# Patient Record
Sex: Female | Born: 1950 | Race: Black or African American | Hispanic: No | Smoking: Never smoker
Health system: Southern US, Community
[De-identification: ages and names within clinical notes are randomized; demographics above are authoritative.]

## PROBLEM LIST (undated history)

## (undated) DIAGNOSIS — Z8489 Family history of other specified conditions: Secondary | ICD-10-CM

## (undated) DIAGNOSIS — I10 Essential (primary) hypertension: Secondary | ICD-10-CM

## (undated) DIAGNOSIS — I82409 Acute embolism and thrombosis of unspecified deep veins of unspecified lower extremity: Secondary | ICD-10-CM

## (undated) DIAGNOSIS — R0600 Dyspnea, unspecified: Secondary | ICD-10-CM

## (undated) DIAGNOSIS — I739 Peripheral vascular disease, unspecified: Secondary | ICD-10-CM

## (undated) DIAGNOSIS — Z9289 Personal history of other medical treatment: Secondary | ICD-10-CM

## (undated) DIAGNOSIS — M1990 Unspecified osteoarthritis, unspecified site: Secondary | ICD-10-CM

## (undated) DIAGNOSIS — T8859XA Other complications of anesthesia, initial encounter: Secondary | ICD-10-CM

## (undated) DIAGNOSIS — Z8679 Personal history of other diseases of the circulatory system: Secondary | ICD-10-CM

## (undated) DIAGNOSIS — I2699 Other pulmonary embolism without acute cor pulmonale: Secondary | ICD-10-CM

## (undated) DIAGNOSIS — R7303 Prediabetes: Secondary | ICD-10-CM

## (undated) DIAGNOSIS — K219 Gastro-esophageal reflux disease without esophagitis: Secondary | ICD-10-CM

## (undated) HISTORY — PX: TONSILLECTOMY AND ADENOIDECTOMY: SHX28

## (undated) HISTORY — DX: Other pulmonary embolism without acute cor pulmonale: I26.99

## (undated) HISTORY — PX: APPENDECTOMY: SHX54

## (undated) HISTORY — DX: Personal history of other diseases of the circulatory system: Z86.79

---

## 2011-05-04 DIAGNOSIS — I2699 Other pulmonary embolism without acute cor pulmonale: Secondary | ICD-10-CM

## 2011-05-04 DIAGNOSIS — I82409 Acute embolism and thrombosis of unspecified deep veins of unspecified lower extremity: Secondary | ICD-10-CM

## 2011-05-04 DIAGNOSIS — Z86718 Personal history of other venous thrombosis and embolism: Secondary | ICD-10-CM | POA: Insufficient documentation

## 2011-05-04 HISTORY — DX: Other pulmonary embolism without acute cor pulmonale: I26.99

## 2011-05-04 HISTORY — DX: Acute embolism and thrombosis of unspecified deep veins of unspecified lower extremity: I82.409

## 2014-04-01 DIAGNOSIS — Z86718 Personal history of other venous thrombosis and embolism: Secondary | ICD-10-CM | POA: Insufficient documentation

## 2015-11-02 ENCOUNTER — Emergency Department: Payer: Self-pay

## 2015-11-02 ENCOUNTER — Emergency Department
Admission: EM | Admit: 2015-11-02 | Discharge: 2015-11-02 | Disposition: A | Discharge status: Home / Self Care | Payer: Self-pay | Attending: Emergency Medicine | Admitting: Emergency Medicine

## 2015-11-02 ENCOUNTER — Encounter: Payer: Self-pay | Admitting: Emergency Medicine

## 2015-11-02 DIAGNOSIS — M5442 Lumbago with sciatica, left side: Secondary | ICD-10-CM | POA: Insufficient documentation

## 2015-11-02 DIAGNOSIS — I10 Essential (primary) hypertension: Secondary | ICD-10-CM | POA: Insufficient documentation

## 2015-11-02 DIAGNOSIS — Z86718 Personal history of other venous thrombosis and embolism: Secondary | ICD-10-CM | POA: Insufficient documentation

## 2015-11-02 HISTORY — DX: Essential (primary) hypertension: I10

## 2015-11-02 HISTORY — DX: Acute embolism and thrombosis of unspecified deep veins of unspecified lower extremity: I82.409

## 2015-11-02 LAB — URINALYSIS COMPLETE WITH MICROSCOPIC (ARMC ONLY)
BILIRUBIN URINE: NEGATIVE
Glucose, UA: NEGATIVE mg/dL
Hgb urine dipstick: NEGATIVE
KETONES UR: NEGATIVE mg/dL
Nitrite: NEGATIVE
Protein, ur: NEGATIVE mg/dL
Specific Gravity, Urine: 1.018 (ref 1.005–1.030)
pH: 5 (ref 5.0–8.0)

## 2015-11-02 MED ORDER — OXYCODONE-ACETAMINOPHEN 5-325 MG PO TABS: ORAL_TABLET | ORAL | Status: DC

## 2015-11-02 MED ORDER — KETOROLAC TROMETHAMINE 30 MG/ML IJ SOLN: 30.0000 mg | Freq: Once | INTRAMUSCULAR | Status: DC

## 2015-11-02 MED ORDER — OXYCODONE-ACETAMINOPHEN 5-325 MG PO TABS
1.0000 | ORAL_TABLET | Freq: Once | ORAL | Status: AC
  Administered 2015-11-02: 1 via ORAL
  Filled 2015-11-02: qty 1

## 2015-11-02 NOTE — Discharge Instructions (Signed)
Discontinue taking tramadol for pain. Percocet 1 tablet every 46 hours as needed for pain. Be aware that you should not drive or operate machinery while taking this medication. This medication also can cause drowsiness which may increase your risk for falling. Make an appointment with Dr. Joice LoftsPoggi if any continued problems with your back or sciatica.

## 2015-11-02 NOTE — ED Notes (Signed)
C/O low back pain radiating down left leg.  Patient has been going [facility] for same complaint, but pain returns.

## 2015-11-02 NOTE — ED Notes (Signed)
Patient states she has had pain to left lower back extending down through the left buttocks down behind left knee.  Pt states she feels like it is rubbing against bone.  Pain is 6/10.

## 2015-11-02 NOTE — ED Provider Notes (Signed)
[facility] Emergency Department Provider Note  ____________________________________________  Time seen: Approximately 1:12 PM  I have reviewed the triage vital signs and the nursing notes.   HISTORY  Chief Complaint Back Pain   HPI Leah Roberts is a 65 y.o. female is here with complaint of left low back pain radiating through her left buttocks just down past her left knee. She denies any numbness in her foot or paresthesias of her toes. Patient states that she has been seen [facility] walk-in twice for the same complaint. Patient currently is taking tramadol for pain however she states that this is not alleviating her pain and she is also completed a course of prednisone. Patient denies any injury. She states she has not had any x-rays done of her back.Patient denies any incontinence of bowel or bladder and does not have a history of injury to her back or hip area. Patient is unable to get comfortable in any position regardless of her pain medication today. She rates her pain as an 8 out of 10.   Past Medical History  Diagnosis Date  . Hypertension   . DVT (deep venous thrombosis) (HCC)     There are no active problems to display for this patient.   History reviewed. No pertinent past surgical history.  Current Outpatient Rx  Name  Route  Sig  Dispense  Refill  . oxyCODONE-acetaminophen (PERCOCET) 5-325 MG tablet      Take 1 tablet every 4-6 hours as needed for pain.   20 tablet   0     Allergies Sulfa antibiotics  No family history on file.  Social History Social History  Substance Use Topics  . Smoking status: Never Smoker   . Smokeless tobacco: None  . Alcohol Use: None    Review of Systems Constitutional: No fever/chills Cardiovascular: Denies chest pain. Respiratory: Denies shortness of breath. Gastrointestinal: No abdominal pain.  No nausea, no vomiting.  Genitourinary: Negative for  dysuria. Musculoskeletal:Positive for low back pain and left hip pain. Skin: Negative for rash. Neurological: Negative for headaches, focal weakness or numbness.Positive for paresthesias left lower extremity.  10-point ROS otherwise negative.  ____________________________________________   PHYSICAL EXAM:  VITAL SIGNS: ED Triage Vitals  Enc Vitals Group     BP 11/02/15 1155 134/101 mmHg     Pulse Rate 11/02/15 1155 83     Resp 11/02/15 1155 18     Temp 11/02/15 1155 98.2 F (36.8 C)     Temp Source 11/02/15 1155 Oral     SpO2 11/02/15 1155 98 %     Weight 11/02/15 1155 462 lb 15.5 oz (210 kg)     Height 11/02/15 1155 5\' 7"  (1.702 m)     Head Cir --      Peak Flow --      Pain Score 11/02/15 1157 8     Pain Loc --      Pain Edu? --      Excl. in GC? --     Constitutional: Alert and oriented. Well appearing and in no acute distress. Eyes: Conjunctivae are normal. PERRL. EOMI. Head: Atraumatic. Nose: No congestion/rhinnorhea. Neck: No stridor.   Cardiovascular: Normal rate, regular rhythm. Grossly normal heart sounds.  Good peripheral circulation. Respiratory: Normal respiratory effort.  No retractions. Lungs CTAB. Musculoskeletal: On examination of the back there is no gross deformity however there is moderate tenderness on palpation of the lumbar spine. There is no tenderness on palpation of the left hip.  Range of motion is without restriction. Patient is ambulatory with a slight limp. Neurologic:  Normal speech and language. No gross focal neurologic deficits are appreciated. No gait instability. There is no deformity or effusion noted of the left knee. Skin:  Skin is warm, dry and intact. No rash noted. Psychiatric: Mood and affect are normal. Speech and behavior are normal.  ____________________________________________   LABS (all labs ordered are listed, but only abnormal results are displayed)  Labs Reviewed  URINALYSIS COMPLETEWITH MICROSCOPIC ([facility] ONLY) -  Abnormal; Notable for the following:    Color, Urine YELLOW (*)    APPearance HAZY (*)    Leukocytes, UA 1+ (*)    Bacteria, UA RARE (*)    Squamous Epithelial / LPF 0-5 (*)    All other components within normal limits     RADIOLOGY  Lumbar spine per radiologist shows loss of disc height with an plate degeneration seen at T 11 T12 and T12-L1. I, Tommi Rumpshonda L Summers, personally viewed and evaluated these images (plain radiographs) as part of my medical decision making, as well as reviewing the written report by the radiologist. ____________________________________________   PROCEDURES  Procedure(s) performed: None  Critical Care performed: No  ____________________________________________   INITIAL IMPRESSION / ASSESSMENT AND PLAN / ED COURSE  Pertinent labs & imaging results that were available during my care of the patient were reviewed by me and considered in my medical decision making (see chart for details  ----------------------------------------- 2:55 PM on 11/02/2015 ----------------------------------------- Checking on patient patient was asleep on the stretcher and was awakened easily. Patient states that her pain has decreased considerably. Patient is having sister pick her up as the patient drove herself to the emergency room. She is aware that after taking the narcotic that she cannot drive herself. Sr. lives close by and is aware that she will need to pick up the patient.  ____________________________________________   FINAL CLINICAL IMPRESSION(S) / ED DIAGNOSES  Final diagnoses:  Left-sided low back pain with left-sided sciatica      NEW MEDICATIONS STARTED DURING THIS VISIT:  Discharge Medication List as of 11/02/2015  3:07 PM    START taking these medications   Details  oxyCODONE-acetaminophen (PERCOCET) 5-325 MG tablet Take 1 tablet every 4-6 hours as needed for pain., Print         Note:  This document was prepared using Dragon voice recognition  software and may include unintentional dictation errors.    Tommi RumpsRhonda L Summers, PA-C 11/02/15 1527  Sharman CheekPhillip Stafford, MD 11/03/15 [phone]

## 2016-06-18 ENCOUNTER — Encounter: Payer: Self-pay | Admitting: Family Medicine

## 2016-06-18 ENCOUNTER — Ambulatory Visit (INDEPENDENT_AMBULATORY_CARE_PROVIDER_SITE_OTHER): Payer: Self-pay | Admitting: Family Medicine

## 2016-06-18 VITALS — BP 117/89 | HR 92 | Temp 97.9°F | Ht 65.75 in | Wt 218.0 lb

## 2016-06-18 DIAGNOSIS — Z7901 Long term (current) use of anticoagulants: Secondary | ICD-10-CM

## 2016-06-18 DIAGNOSIS — M25561 Pain in right knee: Secondary | ICD-10-CM | POA: Insufficient documentation

## 2016-06-18 DIAGNOSIS — M25562 Pain in left knee: Secondary | ICD-10-CM

## 2016-06-18 DIAGNOSIS — Z13220 Encounter for screening for lipoid disorders: Secondary | ICD-10-CM

## 2016-06-18 DIAGNOSIS — Z8679 Personal history of other diseases of the circulatory system: Secondary | ICD-10-CM

## 2016-06-18 DIAGNOSIS — G8929 Other chronic pain: Secondary | ICD-10-CM

## 2016-06-18 DIAGNOSIS — I2699 Other pulmonary embolism without acute cor pulmonale: Secondary | ICD-10-CM

## 2016-06-18 LAB — LIPID PANEL
CHOL/HDL RATIO: 5
Cholesterol: 232 mg/dL — ABNORMAL HIGH (ref 0–200)
HDL: 47 mg/dL (ref >39.00)
LDL CALC: 156 mg/dL — AB (ref 0–99)
NONHDL: 184.69
Triglycerides: 143 mg/dL (ref 0.0–149.0)
VLDL: 28.6 mg/dL (ref 0.0–40.0)

## 2016-06-18 LAB — COMPREHENSIVE METABOLIC PANEL
ALT: 13 U/L (ref 0–35)
AST: 19 U/L (ref 0–37)
Albumin: 3.9 g/dL (ref 3.5–5.2)
Alkaline Phosphatase: 51 U/L (ref 39–117)
BILIRUBIN TOTAL: 0.4 mg/dL (ref 0.2–1.2)
BUN: 14 mg/dL (ref 6–23)
CALCIUM: 9.5 mg/dL (ref 8.4–10.5)
CHLORIDE: 108 meq/L (ref 96–112)
CO2: 25 meq/L (ref 19–32)
CREATININE: 0.98 mg/dL (ref 0.40–1.20)
GFR: 60.81 mL/min (ref >60.00)
GLUCOSE: 89 mg/dL (ref 70–99)
Potassium: 3.9 mEq/L (ref 3.5–5.1)
SODIUM: 140 meq/L (ref 135–145)
Total Protein: 7.1 g/dL (ref 6.0–8.3)

## 2016-06-18 LAB — PROTIME-INR
INR: 1.5 ratio — ABNORMAL HIGH (ref 0.8–1.0)
Prothrombin Time: 15.7 s — ABNORMAL HIGH (ref 9.6–13.1)

## 2016-06-18 LAB — CBC
HCT: 37.4 % (ref 36.0–46.0)
Hemoglobin: 12.1 g/dL (ref 12.0–15.0)
MCHC: 32.3 g/dL (ref 30.0–36.0)
MCV: 83.8 fl (ref 78.0–100.0)
PLATELETS: 213 10*3/uL (ref 150.0–400.0)
RBC: 4.46 Mil/uL (ref 3.87–5.11)
RDW: 14.2 % (ref 11.5–15.5)
WBC: 6.5 10*3/uL (ref 4.0–10.5)

## 2016-06-18 LAB — HEMOGLOBIN A1C: HEMOGLOBIN A1C: 6.3 % (ref 4.6–6.5)

## 2016-06-18 MED ORDER — TRAMADOL HCL 50 MG PO TABS: 50.0000 mg | ORAL_TABLET | Freq: Three times a day (TID) | ORAL | 1 refills | Status: DC | PRN

## 2016-06-18 NOTE — Progress Notes (Signed)
Pre visit review using our clinic review tool, if applicable. No additional management support is needed unless otherwise documented below in the visit note. 

## 2016-06-18 NOTE — Assessment & Plan Note (Signed)
New problem. Likely has underlying OA. Treating with Tramadol.

## 2016-06-18 NOTE — Assessment & Plan Note (Signed)
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 Labs including INR today.  Patient wants to switch to Eliquis. Will switch pending INR. Per guideline/recommendation INR needs to be less than 2 and then she can stop warfarin and start Eliquis.

## 2016-06-18 NOTE — Patient Instructions (Signed)
We will call with your lab results and on when to start the eliquis.  Continue your current medication.  You need a colonoscopy, pap smear, mammogram. You should consider hepatitis C testing and HIV testing as well.  You are a candidate for a shingles vaccine as well.  Take care  Dr. Lacinda Axon   Health Maintenance, Female Introduction Adopting a healthy lifestyle and getting preventive care can go a long way to promote health and wellness. Talk with your health care provider about what schedule of regular examinations is right for you. This is a good chance for you to check in with your provider about disease prevention and staying healthy. In between checkups, there are plenty of things you can do on your own. Experts have done a lot of research about which lifestyle changes and preventive measures are most likely to keep you healthy. Ask your health care provider for more information. Weight and diet Eat a healthy diet  Be sure to include plenty of vegetables, fruits, low-fat dairy products, and lean protein.  Do not eat a lot of foods high in solid fats, added sugars, or salt.  Get regular exercise. This is one of the most important things you can do for your health.  Most adults should exercise for at least 150 minutes each week. The exercise should increase your heart rate and make you sweat (moderate-intensity exercise).  Most adults should also do strengthening exercises at least twice a week. This is in addition to the moderate-intensity exercise. Maintain a healthy weight  Body mass index (BMI) is a measurement that can be used to identify possible weight problems. It estimates body fat based on height and weight. Your health care provider can help determine your BMI and help you achieve or maintain a healthy weight.  For females 39 years of age and older:  A BMI below 18.5 is considered underweight.  A BMI of 18.5 to 24.9 is normal.  A BMI of 25 to 29.9 is considered  overweight.  A BMI of 30 and above is considered obese. Watch levels of cholesterol and blood lipids  You should start having your blood tested for lipids and cholesterol at 64 years of age, then have this test every 5 years.  You may need to have your cholesterol levels checked more often if:  Your lipid or cholesterol levels are high.  You are older than 64 years of age.  You are at high risk for heart disease. Cancer screening Lung Cancer  Lung cancer screening is recommended for adults 38-69 years old who are at high risk for lung cancer because of a history of smoking.  A yearly low-dose CT scan of the lungs is recommended for people who:  Currently smoke.  Have quit within the past 15 years.  Have at least a 30-pack-year history of smoking. A pack year is smoking an average of one pack of cigarettes a day for 1 year.  Yearly screening should continue until it has been 15 years since you quit.  Yearly screening should stop if you develop a health problem that would prevent you from having lung cancer treatment. Breast Cancer  Practice breast self-awareness. This means understanding how your breasts normally appear and feel.  It also means doing regular breast self-exams. Let your health care provider know about any changes, no matter how small.  If you are in your 20s or 30s, you should have a clinical breast exam (CBE) by a health care provider every 1-3 years  as part of a regular health exam.  If you are 69 or older, have a CBE every year. Also consider having a breast X-ray (mammogram) every year.  If you have a family history of breast cancer, talk to your health care provider about genetic screening.  If you are at high risk for breast cancer, talk to your health care provider about having an MRI and a mammogram every year.  Breast cancer gene (BRCA) assessment is recommended for women who have family members with BRCA-related cancers. BRCA-related cancers  include:  Breast.  Ovarian.  Tubal.  Peritoneal cancers.  Results of the assessment will determine the need for genetic counseling and BRCA1 and BRCA2 testing. Cervical Cancer  Your health care provider may recommend that you be screened regularly for cancer of the pelvic organs (ovaries, uterus, and vagina). This screening involves a pelvic examination, including checking for microscopic changes to the surface of your cervix (Pap test). You may be encouraged to have this screening done every 3 years, beginning at age 45.  For women ages 44-65, health care providers may recommend pelvic exams and Pap testing every 3 years, or they may recommend the Pap and pelvic exam, combined with testing for human papilloma virus (HPV), every 5 years. Some types of HPV increase your risk of cervical cancer. Testing for HPV may also be done on women of any age with unclear Pap test results.  Other health care providers may not recommend any screening for nonpregnant women who are considered low risk for pelvic cancer and who do not have symptoms. Ask your health care provider if a screening pelvic exam is right for you.  If you have had past treatment for cervical cancer or a condition that could lead to cancer, you need Pap tests and screening for cancer for at least 20 years after your treatment. If Pap tests have been discontinued, your risk factors (such as having a new sexual partner) need to be reassessed to determine if screening should resume. Some women have medical problems that increase the chance of getting cervical cancer. In these cases, your health care provider may recommend more frequent screening and Pap tests. Colorectal Cancer  This type of cancer can be detected and often prevented.  Routine colorectal cancer screening usually begins at 64 years of age and continues through 64 years of age.  Your health care provider may recommend screening at an earlier age if you have risk factors  for colon cancer.  Your health care provider may also recommend using home test kits to check for hidden blood in the stool.  A small camera at the end of a tube can be used to examine your colon directly (sigmoidoscopy or colonoscopy). This is done to check for the earliest forms of colorectal cancer.  Routine screening usually begins at age 24.  Direct examination of the colon should be repeated every 5-10 years through 64 years of age. However, you may need to be screened more often if early forms of precancerous polyps or small growths are found. Skin Cancer  Check your skin from head to toe regularly.  Tell your health care provider about any new moles or changes in moles, especially if there is a change in a mole's shape or color.  Also tell your health care provider if you have a mole that is larger than the size of a pencil eraser.  Always use sunscreen. Apply sunscreen liberally and repeatedly throughout the day.  Protect yourself by wearing long  sleeves, pants, a wide-brimmed hat, and sunglasses whenever you are outside. Heart disease, diabetes, and high blood pressure  High blood pressure causes heart disease and increases the risk of stroke. High blood pressure is more likely to develop in:  People who have blood pressure in the high end of the normal range (130-139/85-89 mm Hg).  People who are overweight or obese.  People who are African American.  If you are 63-58 years of age, have your blood pressure checked every 3-5 years. If you are 42 years of age or older, have your blood pressure checked every year. You should have your blood pressure measured twice-once when you are at a hospital or clinic, and once when you are not at a hospital or clinic. Record the average of the two measurements. To check your blood pressure when you are not at a hospital or clinic, you can use:  An automated blood pressure machine at a pharmacy.  A home blood pressure monitor.  If you  are between 33 years and 35 years old, ask your health care provider if you should take aspirin to prevent strokes.  Have regular diabetes screenings. This involves taking a blood sample to check your fasting blood sugar level.  If you are at a normal weight and have a low risk for diabetes, have this test once every three years after 64 years of age.  If you are overweight and have a high risk for diabetes, consider being tested at a younger age or more often. Preventing infection Hepatitis B  If you have a higher risk for hepatitis B, you should be screened for this virus. You are considered at high risk for hepatitis B if:  You were born in a country where hepatitis B is common. Ask your health care provider which countries are considered high risk.  Your parents were born in a high-risk country, and you have not been immunized against hepatitis B (hepatitis B vaccine).  You have HIV or AIDS.  You use needles to inject street drugs.  You live with someone who has hepatitis B.  You have had sex with someone who has hepatitis B.  You get hemodialysis treatment.  You take certain medicines for conditions, including cancer, organ transplantation, and autoimmune conditions. Hepatitis C  Blood testing is recommended for:  Everyone born from [birth year] through 1963.  Anyone with known risk factors for hepatitis C. Sexually transmitted infections (STIs)  You should be screened for sexually transmitted infections (STIs) including gonorrhea and chlamydia if:  You are sexually active and are younger than 64 years of age.  You are older than 64 years of age and your health care provider tells you that you are at risk for this type of infection.  Your sexual activity has changed since you were last screened and you are at an increased risk for chlamydia or gonorrhea. Ask your health care provider if you are at risk.  If you do not have HIV, but are at risk, it may be recommended that you  take a prescription medicine daily to prevent HIV infection. This is called pre-exposure prophylaxis (PrEP). You are considered at risk if:  You are sexually active and do not regularly use condoms or know the HIV status of your partner(s).  You take drugs by injection.  You are sexually active with a partner who has HIV. Talk with your health care provider about whether you are at high risk of being infected with HIV. If you choose to begin  PrEP, you should first be tested for HIV. You should then be tested every 3 months for as long as you are taking PrEP. Pregnancy  If you are premenopausal and you may become pregnant, ask your health care provider about preconception counseling.  If you may become pregnant, take 400 to 800 micrograms (mcg) of folic acid every day.  If you want to prevent pregnancy, talk to your health care provider about birth control (contraception). Osteoporosis and menopause  Osteoporosis is a disease in which the bones lose minerals and strength with aging. This can result in serious bone fractures. Your risk for osteoporosis can be identified using a bone density scan.  If you are 41 years of age or older, or if you are at risk for osteoporosis and fractures, ask your health care provider if you should be screened.  Ask your health care provider whether you should take a calcium or vitamin D supplement to lower your risk for osteoporosis.  Menopause may have certain physical symptoms and risks.  Hormone replacement therapy may reduce some of these symptoms and risks. Talk to your health care provider about whether hormone replacement therapy is right for you. Follow these instructions at home:  Schedule regular health, dental, and eye exams.  Stay current with your immunizations.  Do not use any tobacco products including cigarettes, chewing tobacco, or electronic cigarettes.  If you are pregnant, do not drink alcohol.  If you are breastfeeding, limit  how much and how often you drink alcohol.  Limit alcohol intake to no more than 1 drink per day for nonpregnant women. One drink equals 12 ounces of beer, 5 ounces of wine, or 1 ounces of hard liquor.  Do not use street drugs.  Do not share needles.  Ask your health care provider for help if you need support or information about quitting drugs.  Tell your health care provider if you often feel depressed.  Tell your health care provider if you have ever been abused or do not feel safe at home. This information is not intended to replace advice given to you by your health care provider. Make sure you discuss any questions you have with your health care provider. Document Released: 11/02/2010 Document Revised: 09/25/2015 Document Reviewed: 01/21/2015  2017 [employer]

## 2016-06-18 NOTE — Progress Notes (Signed)
Subjective:  Patient ID: Leah Roberts, female    DOB: Nov 24, 1950  Age: 66 y.o. MRN: [id number]  CC: Establish care - Warfarin management, Leg/knee pain  HPI Leah Roberts is a 66 y.o. female presents to the clinic today to establish care.  Issues are below.  Hx of DVT/PE  Patient reports a history of PE and DVT. She's had 2 separate blood clots.  She is from [place]. She has been seeing a specialist in [place]. She is currently on warfarin.  Patient has not had her INR checked in over a year.  She endorses compliance with warfarin 5 mg daily.  She is interested in switching to Eliquis and would like to discuss this today.  Leg pain, knee pain  Patient reports a long-standing history of left leg pain and knee pain.  She states that this is starting to bother her significantly and interfering with activity.  She states that she exercises regularly.  She reports left knee pain and periods of time where she experiences weakness.  She also reports diffuse left lower leg pain. She wears compression stockings regular.  Additionally, she's had some associated low back pain.  Knee pain seems to be worse with activity.  Compression stockings do help her pain.  No other associated symptoms. No other complaint at this time.  PMH, Surgical Hx, Family Hx, Social History reviewed and updated as below.  Past Medical History:  Diagnosis Date  . DVT (deep venous thrombosis) ([place]) 2013  . History of hypertension   . Pulmonary embolism ([place]) 2013   Past Surgical History:  Procedure Laterality Date  . APPENDECTOMY    . TONSILLECTOMY AND ADENOIDECTOMY     Family History  Problem Relation Age of Onset  . Hypertension Father   . Heart disease Father   . Thyroid disease Sister   . Allergies Sister    Social History  Substance Use Topics  . Smoking status: Never Smoker  . Smokeless tobacco: Never Used  . Alcohol use No   Review of Systems  Cardiovascular:  Positive for leg swelling.  Musculoskeletal: Positive for back pain.       L leg/knee pain.  Neurological: Positive for weakness, numbness and headaches.  All other systems reviewed and are negative.  Objective:   Today's Vitals: BP 117/89 (BP Location: Left Arm, Patient Position: Sitting, Cuff Size: Large)   Pulse 92   Temp 97.9 F (36.6 C) (Oral)   Ht 5' 5.75" (1.67 m)   Wt 218 lb (98.9 kg)   SpO2 97%   BMI 35.45 kg/m   Physical Exam  Constitutional: She is oriented to person, place, and time. She appears well-developed. No distress.  HENT:  Head: Normocephalic and atraumatic.  Mouth/Throat: Oropharynx is clear and moist.  Eyes: Conjunctivae are normal. No scleral icterus.  Neck: Neck supple. No thyromegaly present.  Cardiovascular: Normal rate and regular rhythm.   Pulmonary/Chest: Effort normal and breath sounds normal. She has no wheezes.  Abdominal: Soft. She exhibits no distension. There is no tenderness. There is no rebound and no guarding.  Musculoskeletal:  Left leg/knee: Crepitus noted with extension/flexion of knee. Varicose veins noted.  Lymphadenopathy:    She has no cervical adenopathy.  Neurological: She is alert and oriented to person, place, and time.  Skin: Skin is warm. No rash noted.  Psychiatric: She has a normal mood and affect.  Vitals reviewed.  Assessment & Plan:   Problem List Items Addressed This Visit    Pulmonary embolism ([place]) -  Primary    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 Labs including INR today.  Patient wants to switch to Eliquis. Will switch pending INR. Per guideline/recommendation INR needs to be less than 2 and then she can stop warfarin and start Eliquis.      Relevant Medications   warfarin (COUMADIN) 5 MG tablet   Left knee pain    New problem. Likely has underlying OA. Treating with Tramadol.       Other Visit Diagnoses    Long-term (current) use of anticoagulants       Relevant Orders   CBC   INR/PT    HgB A1c   History of hypertension       Relevant Orders   Comp Met (CMET)   Screening, lipid       Relevant Orders   Lipid panel      Outpatient Encounter Prescriptions as of 06/18/2016  Medication Sig  . chlorpheniramine (CHLOR-TRIMETON) 4 MG tablet Take 4 mg by mouth daily.  Marland Kitchen warfarin (COUMADIN) 5 MG tablet Take 5 mg by mouth daily.  . traMADol (ULTRAM) 50 MG tablet Take 1 tablet (50 mg total) by mouth every 8 (eight) hours as needed.   No facility-administered encounter medications on file as of 06/18/2016.     Follow-up: Pending lab work up.  Imogene

## 2016-06-21 ENCOUNTER — Other Ambulatory Visit: Payer: Self-pay | Admitting: Family Medicine

## 2016-06-21 MED ORDER — ROSUVASTATIN CALCIUM 20 MG PO TABS: 20.0000 mg | ORAL_TABLET | Freq: Every day | ORAL | 3 refills | Status: DC

## 2016-06-21 MED ORDER — APIXABAN 2.5 MG PO TABS: 2.5000 mg | ORAL_TABLET | Freq: Two times a day (BID) | ORAL | 3 refills | Status: DC

## 2016-06-24 ENCOUNTER — Telehealth: Payer: Self-pay | Admitting: Family Medicine

## 2016-06-24 NOTE — Telephone Encounter (Signed)
Pt alled and stated that the pharmacy did not receive her rx for apixaban (ELIQUIS) 2.5 MG TABS tablet. Can we please resend? She would also like a copy of her lab work mailed to her. Please advise, thank you!  Pharmacy -  [facility] [address]- [place], [place] - [id number]  Pt's Address - [address]  [place] [address]  Call pt @ [phone]

## 2016-06-24 NOTE — Telephone Encounter (Signed)
Called pharmacy they have had rx for four days and will call pt to let her know they've had it.  Letter was sent.

## 2016-08-04 ENCOUNTER — Ambulatory Visit (INDEPENDENT_AMBULATORY_CARE_PROVIDER_SITE_OTHER): Payer: Self-pay

## 2016-08-04 ENCOUNTER — Encounter: Payer: Self-pay | Admitting: Family Medicine

## 2016-08-04 ENCOUNTER — Encounter: Payer: Self-pay | Admitting: Emergency Medicine

## 2016-08-04 ENCOUNTER — Ambulatory Visit (INDEPENDENT_AMBULATORY_CARE_PROVIDER_SITE_OTHER): Payer: Self-pay | Admitting: Family Medicine

## 2016-08-04 VITALS — BP 130/80 | HR 83 | Temp 98.6°F | Wt 218.0 lb

## 2016-08-04 DIAGNOSIS — M549 Dorsalgia, unspecified: Secondary | ICD-10-CM | POA: Insufficient documentation

## 2016-08-04 DIAGNOSIS — M25562 Pain in left knee: Secondary | ICD-10-CM

## 2016-08-04 DIAGNOSIS — M545 Low back pain: Secondary | ICD-10-CM

## 2016-08-04 DIAGNOSIS — R7303 Prediabetes: Secondary | ICD-10-CM | POA: Insufficient documentation

## 2016-08-04 DIAGNOSIS — E785 Hyperlipidemia, unspecified: Secondary | ICD-10-CM

## 2016-08-04 LAB — LIPID PANEL
CHOL/HDL RATIO: 5
CHOLESTEROL: 208 mg/dL — AB (ref 0–200)
HDL: 40.9 mg/dL (ref >39.00)
LDL Cholesterol: 138 mg/dL — ABNORMAL HIGH (ref 0–99)
NonHDL: 167.48
Triglycerides: 148 mg/dL (ref 0.0–149.0)
VLDL: 29.6 mg/dL (ref 0.0–40.0)

## 2016-08-04 MED ORDER — APIXABAN 2.5 MG PO TABS: 2.5000 mg | ORAL_TABLET | Freq: Two times a day (BID) | ORAL | 3 refills | Status: DC

## 2016-08-04 MED ORDER — TRAMADOL HCL 50 MG PO TABS: 50.0000 mg | ORAL_TABLET | Freq: Three times a day (TID) | ORAL | 1 refills | Status: DC | PRN

## 2016-08-04 NOTE — Patient Instructions (Addendum)
Xray today. We will call with the results.  Exercises for the back are below.  I have refilled your medication.  Follow up in 6 months.  Take care  Dr. Adriana Simas    Back Exercises The following exercises strengthen the muscles that help to support the back. They also help to keep the lower back flexible. Doing these exercises can help to prevent back pain or lessen existing pain. If you have back pain or discomfort, try doing these exercises 2-3 times each day or as told by your health care provider. When the pain goes away, do them once each day, but increase the number of times that you repeat the steps for each exercise (do more repetitions). If you do not have back pain or discomfort, do these exercises once each day or as told by your health care provider. Exercises Single Knee to Chest   Repeat these steps 3-5 times for each leg: 1. Lie on your back on a firm bed or the floor with your legs extended. 2. Bring one knee to your chest. Your other leg should stay extended and in contact with the floor. 3. Hold your knee in place by grabbing your knee or thigh. 4. Pull on your knee until you feel a gentle stretch in your lower back. 5. Hold the stretch for 10-30 seconds. 6. Slowly release and straighten your leg. Pelvic Tilt   Repeat these steps 5-10 times: 1. Lie on your back on a firm bed or the floor with your legs extended. 2. Bend your knees so they are pointing toward the ceiling and your feet are flat on the floor. 3. Tighten your lower abdominal muscles to press your lower back against the floor. This motion will tilt your pelvis so your tailbone points up toward the ceiling instead of pointing to your feet or the floor. 4. With gentle tension and even breathing, hold this position for 5-10 seconds. Cat-Cow   Repeat these steps until your lower back becomes more flexible: 1. Get into a hands-and-knees position on a firm surface. Keep your hands under your shoulders, and keep  your knees under your hips. You may place padding under your knees for comfort. 2. Let your head hang down, and point your tailbone toward the floor so your lower back becomes rounded like the back of a cat. 3. Hold this position for 5 seconds. 4. Slowly lift your head and point your tailbone up toward the ceiling so your back forms a sagging arch like the back of a cow. 5. Hold this position for 5 seconds. Press-Ups   Repeat these steps 5-10 times: 1. Lie on your abdomen (face-down) on the floor. 2. Place your palms near your head, about shoulder-width apart. 3. While you keep your back as relaxed as possible and keep your hips on the floor, slowly straighten your arms to raise the top half of your body and lift your shoulders. Do not use your back muscles to raise your upper torso. You may adjust the placement of your hands to make yourself more comfortable. 4. Hold this position for 5 seconds while you keep your back relaxed. 5. Slowly return to lying flat on the floor. Bridges   Repeat these steps 10 times: 1. Lie on your back on a firm surface. 2. Bend your knees so they are pointing toward the ceiling and your feet are flat on the floor. 3. Tighten your buttocks muscles and lift your buttocks off of the floor until your waist is  at almost the same height as your knees. You should feel the muscles working in your buttocks and the back of your thighs. If you do not feel these muscles, slide your feet 1-2 inches farther away from your buttocks. 4. Hold this position for 3-5 seconds. 5. Slowly lower your hips to the starting position, and allow your buttocks muscles to relax completely. If this exercise is too easy, try doing it with your arms crossed over your chest. Abdominal Crunches   Repeat these steps 5-10 times: 1. Lie on your back on a firm bed or the floor with your legs extended. 2. Bend your knees so they are pointing toward the ceiling and your feet are flat on the  floor. 3. Cross your arms over your chest. 4. Tip your chin slightly toward your chest without bending your neck. 5. Tighten your abdominal muscles and slowly raise your trunk (torso) high enough to lift your shoulder blades a tiny bit off of the floor. Avoid raising your torso higher than that, because it can put too much stress on your low back and it does not help to strengthen your abdominal muscles. 6. Slowly return to your starting position. Back Lifts  Repeat these steps 5-10 times: 1. Lie on your abdomen (face-down) with your arms at your sides, and rest your forehead on the floor. 2. Tighten the muscles in your legs and your buttocks. 3. Slowly lift your chest off of the floor while you keep your hips pressed to the floor. Keep the back of your head in line with the curve in your back. Your eyes should be looking at the floor. 4. Hold this position for 3-5 seconds. 5. Slowly return to your starting position. Contact a health care provider if:  Your back pain or discomfort gets much worse when you do an exercise.  Your back pain or discomfort does not lessen within 2 hours after you exercise. If you have any of these problems, stop doing these exercises right away. Do not do them again unless your health care provider says that you can. Get help right away if:  You develop sudden, severe back pain. If this happens, stop doing the exercises right away. Do not do them again unless your health care provider says that you can. This information is not intended to replace advice given to you by your health care provider. Make sure you discuss any questions you have with your health care provider. Document Released: 05/27/2004 Document Revised: 08/27/2015 Document Reviewed: 06/13/2014 Elsevier Interactive Patient Education  2017 [employer].

## 2016-08-04 NOTE — Assessment & Plan Note (Signed)
New problem. Likely MSK in nature. Exercises given.  PRN Tramadol.

## 2016-08-04 NOTE — Progress Notes (Signed)
Subjective:  Patient ID: Leah Roberts, female    DOB: 12/03/1950  Age: 66 y.o. 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  Back pain  2 weeks.  No known fall, trauma, injury.  She has been more physically active recently.  She states like it is muscular in origin.  Worse with activity.  No known relieving factors.  She reports some associated numbness/tingling in her left leg. However, she states it is not as severe as her prior issues with sciatica.  No other associated symptoms.  Left knee pain  Improved since her last visit but still remains.  She is using tramadol as needed.  She is concerned and wants to know the etiology of her pain. As I told her at her last visit this is likely from osteoarthritis.  She wants an x-ray today.  Hyperlipidemia  She did not take the Crestor as prescribed.  She states that she elected for dietary changes instructed.  She would like her cholesterol rechecked today.  Social Hx   Social History   Social History  . Marital status: Widowed    Spouse name: N/A  . Number of children: N/A  . Years of education: N/A   Social History Main Topics  . Smoking status: Never Smoker  . Smokeless tobacco: Never Used  . Alcohol use No  . Drug use: No  . Sexual activity: Not Asked   Other Topics Concern  . None   Social History Narrative  . None    Review of Systems  Constitutional: Negative.   Musculoskeletal: Positive for back pain.       Knee pain.   Objective:  BP 130/80   Pulse 83   Temp 98.6 F (37 C) (Oral)   Wt 218 lb (98.9 kg)   SpO2 95%   BMI 35.45 kg/m   BP/Weight 08/04/2016 06/18/2016  Systolic BP 130 117  Diastolic BP 80 89  Wt. (Lbs) 218 218  BMI 35.45 35.45    Physical Exam  Constitutional: She is oriented to person, place, and time. She appears  well-developed. No distress.  Cardiovascular: Normal rate and regular rhythm.   Pulmonary/Chest: Effort normal and breath sounds normal.  Musculoskeletal:  Low back - no tenderness to palpation. Negative straight leg raise.  Left knee - ligament is intact. No swelling. Crepitus with range of motion.  Neurological: She is alert and oriented to person, place, and time.  Psychiatric: She has a normal mood and affect.  Vitals reviewed.   Lab Results  Component Value Date   WBC 6.5 06/18/2016   HGB 12.1 06/18/2016   HCT 37.4 06/18/2016   PLT 213.0 06/18/2016   GLUCOSE 89 06/18/2016   CHOL 232 (H) 06/18/2016   TRIG 143.0 06/18/2016   HDL 47.00 06/18/2016   LDLCALC 156 (H) 06/18/2016   ALT 13 06/18/2016   AST 19 06/18/2016   NA 140 06/18/2016   K 3.9 06/18/2016   CL 108 06/18/2016   CREATININE 0.98 06/18/2016   BUN 14 06/18/2016   CO2 25 06/18/2016   INR 1.5 (H) 06/18/2016   HGBA1C 6.3 06/18/2016    Assessment & Plan:   Problem List Items Addressed This Visit    Left knee pain    Improved but still persists. Xray today. Continue PRN Tramadol.       Relevant Orders   DG Knee Complete 4 Views  Left   Hyperlipidemia    Uncontrolled per last labs. Rechecking today.      Relevant Medications   apixaban (ELIQUIS) 2.5 MG TABS tablet   Other Relevant Orders   Lipid panel   Back pain - Primary    New problem. Likely MSK in nature. Exercises given.  PRN Tramadol.       Relevant Medications   traMADol (ULTRAM) 50 MG tablet      Meds ordered this encounter  Medications  . traMADol (ULTRAM) 50 MG tablet    Sig: Take 1 tablet (50 mg total) by mouth every 8 (eight) hours as needed.    Dispense:  90 tablet    Refill:  1  . apixaban (ELIQUIS) 2.5 MG TABS tablet    Sig: Take 1 tablet (2.5 mg total) by mouth 2 (two) times daily.    Dispense:  180 tablet    Refill:  3    Follow-up: Return in about 6 months (around 02/03/2017).  Everlene Other DO [facility]

## 2016-08-04 NOTE — Assessment & Plan Note (Signed)
Uncontrolled per last labs. Rechecking today.

## 2016-08-04 NOTE — Assessment & Plan Note (Signed)
Improved but still persists. Xray today. Continue PRN Tramadol.

## 2016-08-04 NOTE — Progress Notes (Signed)
Pre visit review using our clinic review tool, if applicable. No additional management support is needed unless otherwise documented below in the visit note. 

## 2017-01-25 ENCOUNTER — Telehealth: Payer: Self-pay | Admitting: *Deleted

## 2017-01-25 ENCOUNTER — Telehealth: Payer: Self-pay | Admitting: Family

## 2017-01-25 ENCOUNTER — Ambulatory Visit (INDEPENDENT_AMBULATORY_CARE_PROVIDER_SITE_OTHER): Payer: Self-pay | Admitting: Family

## 2017-01-25 ENCOUNTER — Ambulatory Visit
Admission: RE | Admit: 2017-01-25 | Discharge: 2017-01-25 | Disposition: A | Discharge status: Home / Self Care | Payer: Self-pay | Source: Ambulatory Visit | Attending: Family | Admitting: Family

## 2017-01-25 ENCOUNTER — Encounter: Payer: Self-pay | Admitting: Family

## 2017-01-25 VITALS — BP 138/90 | HR 70 | Temp 98.2°F | Ht 65.75 in | Wt 219.2 lb

## 2017-01-25 DIAGNOSIS — G8929 Other chronic pain: Secondary | ICD-10-CM | POA: Insufficient documentation

## 2017-01-25 DIAGNOSIS — M25562 Pain in left knee: Secondary | ICD-10-CM | POA: Insufficient documentation

## 2017-01-25 DIAGNOSIS — I2699 Other pulmonary embolism without acute cor pulmonale: Secondary | ICD-10-CM

## 2017-01-25 MED ORDER — TRAMADOL HCL 50 MG PO TABS: 50.0000 mg | ORAL_TABLET | Freq: Two times a day (BID) | ORAL | 1 refills | Status: DC

## 2017-01-25 MED ORDER — APIXABAN 2.5 MG PO TABS: 2.5000 mg | ORAL_TABLET | Freq: Two times a day (BID) | ORAL | 3 refills | Status: DC

## 2017-01-25 NOTE — Telephone Encounter (Signed)
Pt has requested to have a paper script for Eliquis  Pt contact [phone]

## 2017-01-25 NOTE — Telephone Encounter (Signed)
Marchelle Folks called from [facility] [place] and patient [place] was negative for DVT report in EPIC.

## 2017-01-25 NOTE — Patient Instructions (Signed)
At this point, I'll refer you to orthopedics since the pain is not responding to conservative therapy at home. May continue tramadol as needed.  Ice Ace wrap  Stat ultrasound of left left to ensure NO blood clot.   Follow-up in 3 months.

## 2017-01-25 NOTE — Assessment & Plan Note (Signed)
Chronic. Worsening. Failed conservative therapy at home. Refer to orthopedic. Refill tramadol to be used as needed. I looked up patient on [place] Controlled Substances Reporting System and saw no activity that raised concern of inappropriate use.

## 2017-01-25 NOTE — Progress Notes (Signed)
Subjective:    Patient ID: Leah Roberts, female    DOB: May 03, 1951, 66 y.o.   MRN: [id number]  CC: Verne Brines is a 66 y.o. female who presents today for an acute visit.    HPI: CC: left medial knee pain for several months, worsening.   Last week went to [facility] by car and thinks riding in car aggrevated left knee. Feels like knee pain is affecting left low back as hard to 'step without pain' and knee feels like it will give out h/o sciatica however not having numbness at this time. Notes swelling around knee. Wearing knee brace with some relief.  Tramadol with some relief.  No SOB, CP, fever, N, V.   No falls, injury.   H/o PE- Changed to elliquis from warfarin, several months ago. Doing well on eliquis. No bleeding.      Had been seen 5 months ago for back pain, left knee pain. She was using tramadol as needed at that time.   Left knee x-ray shows degenerative joint disease medially. No acute abnormality seen.2018 Lumbar spine XR 2017 shows loss of disc height T11-12 and T12-L1. No fracture, 2017.   No CKD.  HISTORY:  Past Medical History:  Diagnosis Date  . DVT (deep venous thrombosis) ([facility])   . DVT (deep venous thrombosis) ([facility]) 2013  . History of hypertension   . Hypertension   . Pulmonary embolism ([facility]) 2013   Past Surgical History:  Procedure Laterality Date  . APPENDECTOMY    . TONSILLECTOMY AND ADENOIDECTOMY     Family History  Problem Relation Age of Onset  . Hypertension Father   . Heart disease Father   . Thyroid disease Sister   . Allergies Sister     Allergies: Sulfa antibiotics and Sulfa antibiotics Current Outpatient Prescriptions on File Prior to Visit  Medication Sig Dispense Refill  . chlorpheniramine (CHLOR-TRIMETON) 4 MG tablet Take 4 mg by mouth daily.    . rosuvastatin (CRESTOR) 20 MG tablet Take 1 tablet (20 mg total) by mouth daily. 90 tablet 3   No current facility-administered medications on file prior to visit.      Social History  Substance Use Topics  . Smoking status: Never Smoker  . Smokeless tobacco: Never Used  . Alcohol use No    Review of Systems  Constitutional: Negative for chills and fever.  Respiratory: Negative for cough, shortness of breath and wheezing.   Cardiovascular: Positive for leg swelling. Negative for chest pain and palpitations.  Gastrointestinal: Negative for nausea and vomiting.  Musculoskeletal: Positive for back pain and joint swelling.  Neurological: Negative for numbness.      Objective:    BP 138/90   Pulse 70   Temp 98.2 F (36.8 C) (Oral)   Ht 5' 5.75" (1.67 m)   Wt 219 lb 3.2 oz (99.4 kg)   SpO2 97%   BMI 35.65 kg/m    Physical Exam  Constitutional: She appears well-developed and well-nourished.  Eyes: Conjunctivae are normal.  Cardiovascular: Normal rate, regular rhythm, normal heart sounds and normal pulses.   Trace LLE edema. No palpable cords or masses. No erythema or increased warmth. No asymmetry in calf size when compared bilaterally LE hair growth symmetric and present. No discoloration of varicosities noted. LE warm and palpable pedal pulses.   Pulmonary/Chest: Effort normal and breath sounds normal. She has no wheezes. She has no rhonchi. She has no rales.  Musculoskeletal:       Left knee: She exhibits normal  range of motion, no swelling and no effusion. Tenderness found. Medial joint line tenderness noted.  Bilateral knees are symmetric. No effusion appreciated. No increase in warmth or erythema. Crepitus felt with flexion of bilateral knees. Left knee:  Able to extend to -5 to 10 degrees and flex to 110 degrees. No catching with McMurray maneuver. No patellar apprehension. Negative anterior drawer and lachman's- no laxity appreciated.  No calf tenderness of lower leg edema bilaterally.    Neurological: She is alert.  Skin: Skin is warm and dry.  Psychiatric: She has a normal mood and affect. Her speech is normal and behavior  is normal. Thought content normal.  Vitals reviewed.      Assessment & Plan:   Problem List Items Addressed This Visit      Cardiovascular and Mediastinum   Pulmonary embolism ([facility])    Tolerating eliquis well. Had been on Coumadin the past. Refilled medication for patient today. No shortness of breath, tachycardia. Due to slight swelling in left lower extremity, pending stat ultrasound to ensure no DVT.      Relevant Medications   apixaban (ELIQUIS) 2.5 MG TABS tablet     Other   Left knee pain - Primary    Chronic. Worsening. Failed conservative therapy at home. Refer to orthopedic. Refill tramadol to be used as needed. I looked up patient on  Controlled Substances Reporting System and saw no activity that raised concern of inappropriate use.        Relevant Medications   traMADol (ULTRAM) 50 MG tablet   Other Relevant Orders   Ambulatory referral to Orthopedic Surgery   US Venous Img Lower Unilateral Left        I have discontinued Ms. Donado's oxyCODONE-acetaminophen. I have also changed her traMADol. Additionally, I am having her maintain her apixaban, chlorpheniramine, and rosuvastatin.   Meds ordered this encounter  Medications  . apixaban (ELIQUIS) 2.5 MG TABS tablet    Sig: Take 1 tablet (2.5 mg total) by mouth 2 (two) times daily.    Dispense:  180 tablet    Refill:  3    Order Specific Question:   Supervising Provider    Answer:   Duncan Dull L [[id number]]  . traMADol (ULTRAM) 50 MG tablet    Sig: Take 1 tablet (50 mg total) by mouth 2 (two) times daily.    Dispense:  60 tablet    Refill:  1    Order Specific Question:   Supervising Provider    Answer:   Sherlene Shams [[id number]]    Return precautions given.   Risks, benefits, and alternatives of the medications and treatment plan prescribed today were discussed, and patient expressed understanding.   Education regarding symptom management and diagnosis given to patient on AVS.  Continue to  follow with Allegra Grana, FNP for routine health maintenance.   Mineola Oralia Rud and I agreed with plan.   Rennie Plowman, FNP

## 2017-01-25 NOTE — Assessment & Plan Note (Signed)
Tolerating eliquis well. Had been on Coumadin the past. Refilled medication for patient today. No shortness of breath, tachycardia. Due to slight swelling in left lower extremity, pending stat ultrasound to ensure no DVT.

## 2017-01-25 NOTE — Telephone Encounter (Signed)
Ok to do

## 2017-01-25 NOTE — Progress Notes (Signed)
Pre visit review using our clinic review tool, if applicable. No additional management support is needed unless otherwise documented below in the visit note. 

## 2017-01-26 NOTE — Telephone Encounter (Signed)
Sure

## 2017-01-27 MED ORDER — APIXABAN 2.5 MG PO TABS: 2.5000 mg | ORAL_TABLET | Freq: Two times a day (BID) | ORAL | 0 refills | Status: DC

## 2017-01-27 NOTE — Telephone Encounter (Signed)
Script has been printed and ready for pick up by patient up front.

## 2017-01-27 NOTE — Telephone Encounter (Signed)
Patient has been notified

## 2017-03-18 ENCOUNTER — Telehealth: Payer: Self-pay | Admitting: Family

## 2017-03-18 DIAGNOSIS — I2699 Other pulmonary embolism without acute cor pulmonale: Secondary | ICD-10-CM

## 2017-03-18 NOTE — Telephone Encounter (Signed)
Patient says that she has enough pills to last her for 6 days. She says she doesn't want to take Warfarin. She took it for 4 years and did not like it so she would like something else. There is nothing else affordable for her to take without insurance.

## 2017-03-18 NOTE — Telephone Encounter (Signed)
Left very detailed message regarding last message.

## 2017-03-18 NOTE — Telephone Encounter (Signed)
Copied from CRM [phone]. Topic: Inquiry >> Mar 18, 2017 12:34 PM Alexander BergeronBarksdale, Harvey B wrote: Reason for CRM: PT [facility] and said [facility] well but since she doesn't have any insurance its real expensive ($400), so she wanted to know if she could get prescribed the 5mg  form of it so that she can break it in half and have it last two months or have something else prescribed that works the same and costs less, contact pt if needed

## 2017-03-18 NOTE — Telephone Encounter (Signed)
If she does not have insurance she will need to switch to coumadin. How much eliquis does she have left ?

## 2017-03-18 NOTE — Telephone Encounter (Signed)
Without insurance , all of the direct Thrombin inhibitors will be similarly expensive.  She should not have waited until the last minute to make this decision especially since her PCP was going out on maternity leave.  She can check with her pharmacy about Xarelto  20 mg daily

## 2017-03-18 NOTE — Telephone Encounter (Signed)
Patient taking Eliquis 2.5 mg BID she is wanting toget acript for 5 mg and cutin half to save money because she has no insurance but the 5 mg is not cored so , would she need another medication less expensive.

## 2017-03-18 NOTE — Telephone Encounter (Signed)
LMTCB

## 2017-03-22 MED ORDER — APIXABAN 2.5 MG PO TABS: 2.5000 mg | ORAL_TABLET | Freq: Two times a day (BID) | ORAL | 0 refills | Status: DC

## 2017-03-22 NOTE — Telephone Encounter (Signed)
Patient stated she will stay on Eliquis and pay the 400 a month hat she cannot take warfarin and xarelto  Cost the same as eliquis. I filled script FYI

## 2017-10-05 ENCOUNTER — Ambulatory Visit (INDEPENDENT_AMBULATORY_CARE_PROVIDER_SITE_OTHER): Payer: Self-pay | Admitting: Family

## 2017-10-05 ENCOUNTER — Encounter: Payer: Self-pay | Admitting: Family

## 2017-10-05 VITALS — BP 124/74 | HR 73 | Temp 98.1°F | Resp 16 | Wt 219.0 lb

## 2017-10-05 DIAGNOSIS — Z1159 Encounter for screening for other viral diseases: Secondary | ICD-10-CM

## 2017-10-05 DIAGNOSIS — Z1239 Encounter for other screening for malignant neoplasm of breast: Secondary | ICD-10-CM

## 2017-10-05 DIAGNOSIS — G8929 Other chronic pain: Secondary | ICD-10-CM

## 2017-10-05 DIAGNOSIS — M549 Dorsalgia, unspecified: Secondary | ICD-10-CM

## 2017-10-05 DIAGNOSIS — Z114 Encounter for screening for human immunodeficiency virus [HIV]: Secondary | ICD-10-CM | POA: Insufficient documentation

## 2017-10-05 DIAGNOSIS — E782 Mixed hyperlipidemia: Secondary | ICD-10-CM

## 2017-10-05 DIAGNOSIS — M25562 Pain in left knee: Secondary | ICD-10-CM

## 2017-10-05 DIAGNOSIS — Z1231 Encounter for screening mammogram for malignant neoplasm of breast: Secondary | ICD-10-CM

## 2017-10-05 DIAGNOSIS — Z1211 Encounter for screening for malignant neoplasm of colon: Secondary | ICD-10-CM | POA: Insufficient documentation

## 2017-10-05 DIAGNOSIS — I2699 Other pulmonary embolism without acute cor pulmonale: Secondary | ICD-10-CM

## 2017-10-05 DIAGNOSIS — R7303 Prediabetes: Secondary | ICD-10-CM

## 2017-10-05 DIAGNOSIS — G629 Polyneuropathy, unspecified: Secondary | ICD-10-CM | POA: Insufficient documentation

## 2017-10-05 LAB — BASIC METABOLIC PANEL
BUN: 16 mg/dL (ref 6–23)
CHLORIDE: 106 meq/L (ref 96–112)
CO2: 27 meq/L (ref 19–32)
CREATININE: 0.97 mg/dL (ref 0.40–1.20)
Calcium: 9.8 mg/dL (ref 8.4–10.5)
GFR: 74.15 mL/min (ref >60.00)
GLUCOSE: 103 mg/dL — AB (ref 70–99)
Potassium: 4.6 mEq/L (ref 3.5–5.1)
Sodium: 139 mEq/L (ref 135–145)

## 2017-10-05 LAB — LIPID PANEL
CHOL/HDL RATIO: 5
Cholesterol: 227 mg/dL — ABNORMAL HIGH (ref 0–200)
HDL: 47.1 mg/dL (ref >39.00)
LDL CALC: 155 mg/dL — AB (ref 0–99)
NONHDL: 180.09
Triglycerides: 124 mg/dL (ref 0.0–149.0)
VLDL: 24.8 mg/dL (ref 0.0–40.0)

## 2017-10-05 LAB — TSH: TSH: 5.1 u[IU]/mL — ABNORMAL HIGH (ref 0.35–4.50)

## 2017-10-05 LAB — B12 AND FOLATE PANEL
FOLATE: 17.9 ng/mL (ref >5.9)
VITAMIN B 12: 580 pg/mL (ref 211–911)

## 2017-10-05 LAB — HEMOGLOBIN A1C: Hgb A1c MFr Bld: 6.2 % (ref 4.6–6.5)

## 2017-10-05 LAB — VITAMIN D 25 HYDROXY (VIT D DEFICIENCY, FRACTURES): VITD: 21.43 ng/mL — ABNORMAL LOW (ref 30.00–100.00)

## 2017-10-05 MED ORDER — DICLOFENAC SODIUM 1 % TD GEL: 4.0000 g | Freq: Four times a day (QID) | TRANSDERMAL | 3 refills | Status: DC

## 2017-10-05 MED ORDER — GABAPENTIN 100 MG PO CAPS: 100.0000 mg | ORAL_CAPSULE | Freq: Three times a day (TID) | ORAL | 3 refills | Status: DC

## 2017-10-05 NOTE — Assessment & Plan Note (Signed)
Pending labs

## 2017-10-05 NOTE — Assessment & Plan Note (Signed)
Very over due.  Ordered, patient understands to schedule.

## 2017-10-05 NOTE — Assessment & Plan Note (Addendum)
Worsening.  Concern for meniscal etiology, chronic effusion.  No evidence of infection.  Given patient some topical agents to try and also a retrial of gabapentin as this is helped in the past.  Consult orthopedics.

## 2017-10-05 NOTE — Assessment & Plan Note (Signed)
Exam supports SI joint dysfunction.  Again retrial of gabapentin.  Consult orthopedics.

## 2017-10-05 NOTE — Patient Instructions (Signed)
Trial of gabapentin, topical voltaren gel  Today we discussed referrals, orders. ORTHOPEDICS   I have placed these orders in the system for you.  Please be sure to give us a call if you have not heard from our office regarding scheduling a test or regarding referral in a timely manner.  It is very important that you let me know as soon as possible.     We placed a referral for mammogram this year. I asked that you call one the below locations and schedule this when it is convenient for you.   As discussed, I would like you to ask for 3D mammogram over the traditional 2D mammogram as new evidence suggest 3D is superior.   Please note that NOT all insurance companies cover 3D and you may have to pay a higher copay. You may call your insurance company to further clarify your benefits.   Options for Mammogram.    [facility]  [address]  [place], [place]  [phone]  * [facility]*   [facility] [address] [place], [place] [phone] * Note if you ask for 3D mammogram at this location, you must request [place], [place] location*    For post menopausal women, guidelines recommend a diet with 1200 mg of Calcium per day. If you are eating calcium rich foods, you do not need a calcium supplement. The body better absorbs the calcium that you eat over supplementation. If you do supplement, I recommend not supplementing the full 1200 mg/ day as this can lead to increased risk of cardiovascular disease. I recommend Calcium Citrate over the counter, and you may take a total of 600 to 800 mg per day in divided doses with meals for best absorption.   For bone health, you need adequate vitamin D, and I recommend you supplement as it is harder to do so with diet alone. I recommend cholecalciferol 800 units daily.  Also, please ensure you are following a diet high in calcium -- research shows better outcomes with dietary sources  including kale, yogurt, broccolii, cheese, okra, almonds- to name a few.     Also remember that exercise is a great medicine for maintain and preserve bone health. Advise moderate exercise for 30 minutes , 3 times per week.

## 2017-10-05 NOTE — Assessment & Plan Note (Signed)
Doing well on Eliquis.  Will continue.

## 2017-10-05 NOTE — Assessment & Plan Note (Signed)
Numbness reported on Bilateral lower extremities however not appreciated on exam.  Pending labs.

## 2017-10-05 NOTE — Progress Notes (Signed)
Subjective:    Patient ID: Leah Roberts, female    DOB: 03-Apr-1951, 67 y.o.   MRN: [id number]  CC: Leah Roberts is a 67 y.o. female who presents today for an acute visit.    HPI: Chronic Left knee pain- 2 years, worsening. tramadol hasnt been helpful. Gabapentin was helpful however reports cough. tyleonol with miniam relief. Getting up from sitting has become harder. Stiffness after sitting. Some swelling. No heat. Wears stockings and sleeve with some relief.  Feels numbness on bilateral feet from soles to calf area.   Also complains of left hip pain, radiates to left knee. Numbness. Worse after standing, bending such as working in garden. Uncomfortable to lay on left side. No pain in groin. No falls.  No h/o gout.   HLD- compliant with medication.  Tolerating eliquis. No nose bleeds, easy bleeding.      HISTORY:  Past Medical History:  Diagnosis Date  . DVT (deep venous thrombosis) (HCC)   . DVT (deep venous thrombosis) (HCC) 2013  . History of hypertension   . Hypertension   . Pulmonary embolism (HCC) 2013   Past Surgical History:  Procedure Laterality Date  . APPENDECTOMY    . TONSILLECTOMY AND ADENOIDECTOMY     Family History  Problem Relation Age of Onset  . Hypertension Father   . Heart disease Father   . Thyroid disease Sister   . Allergies Sister     Allergies: Sulfa antibiotics and Sulfa antibiotics Current Outpatient Medications on File Prior to Visit  Medication Sig Dispense Refill  . apixaban (ELIQUIS) 2.5 MG TABS tablet Take 1 tablet (2.5 mg total) by mouth 2 (two) times daily. 180 tablet 0   No current facility-administered medications on file prior to visit.     Social History   Tobacco Use  . Smoking status: Never Smoker  . Smokeless tobacco: Never Used  Substance Use Topics  . Alcohol use: No  . Drug use: No    Review of Systems  Constitutional: Negative for chills and fever.  Respiratory: Negative for cough.     Cardiovascular: Negative for chest pain, palpitations and leg swelling.  Gastrointestinal: Negative for nausea and vomiting.  Musculoskeletal: Positive for back pain (chronic) and joint swelling (left knee).  Neurological: Positive for numbness. Negative for dizziness and headaches.      Objective:    BP 124/74 (BP Location: Left Arm, Patient Position: Sitting, Cuff Size: Large)   Pulse 73   Temp 98.1 F (36.7 C) (Oral)   Resp 16   Wt 219 lb (99.3 kg)   SpO2 98%   BMI 35.62 kg/m  Wt Readings from Last 3 Encounters:  10/05/17 219 lb (99.3 kg)  01/25/17 219 lb 3.2 oz (99.4 kg)  08/04/16 218 lb (98.9 kg)     Physical Exam  Constitutional: She appears well-developed and well-nourished.  Eyes: Conjunctivae are normal.  Cardiovascular: Normal rate, regular rhythm, normal heart sounds and normal pulses.  No LE edema, palpable cords or masses. No erythema or increased warmth. No asymmetry in calf size when compared bilaterally LE hair growth symmetric and present.  Varicosities noted. LE warm and palpable pedal pulses.   Pulmonary/Chest: Effort normal and breath sounds normal. She has no wheezes. She has no rhonchi. She has no rales.  Musculoskeletal:       Left knee: She exhibits swelling. She exhibits normal range of motion and no effusion. Tenderness found. Medial joint line tenderness noted.       Lumbar back:  She exhibits tenderness. She exhibits normal range of motion, no bony tenderness, no swelling, no edema, no pain and no spasm.  Full range of motion with flexion, tension, lateral side bends. No bony tenderness. Pain over left SI joint. No pain, numbness, tingling elicited with single leg raise bilaterally.   Left Hip: No limp or waddling gait. Full ROM with flexion and hip rotation in flexion.    No pain of lateral hip with  (flexion-abduction-external rotation) test.   No pain with deep palpation of greater trochanter.   Bilateral knees are symmetric.  No increase  in warmth or erythema. Crepitus felt with flexion of bilateral knees.  left knee:  Left knee appears larger than right. Joint larger and suspect subtle effusion. No erythema, warmth appreciated.  Able to extend to -5 to 10 degrees and flex to 110 degrees. No catching with McMurray maneuver. No patellar apprehension.   No calf tenderness of lower leg edema bilaterally.     Neurological: She is alert. She has normal strength. No sensory deficit.  Reflex Scores:      Patellar reflexes are 2+ on the right side and 2+ on the left side. Sensation  To microfilament and strength intact bilateral lower extremities.  Skin: Skin is warm and dry.  Psychiatric: She has a normal mood and affect. Her speech is normal and behavior is normal. Thought content normal.  Vitals reviewed.      Assessment & Plan:   Problem List Items Addressed This Visit      Cardiovascular and Mediastinum   Pulmonary embolism (HCC)    Doing well on Eliquis.  Will continue.        Nervous and Auditory   Neuropathy    Numbness reported on Bilateral lower extremities however not appreciated on exam.  Pending labs.      Relevant Orders   Hemoglobin A1c   B12 and Folate Panel   VITAMIN D 25 Hydroxy (Vit-D Deficiency, Fractures)   TSH     Other   Left knee pain - Primary    Worsening.  Concern for meniscal etiology, chronic effusion.  No evidence of infection.  Given patient some topical agents to try and also a retrial of gabapentin as this is helped in the past.  Consult orthopedics.      Relevant Medications   gabapentin (NEURONTIN) 100 MG capsule   diclofenac sodium (VOLTAREN) 1 % GEL   Other Relevant Orders   Ambulatory referral to Orthopedic Surgery   VITAMIN D 25 Hydroxy (Vit-D Deficiency, Fractures)   Hyperlipidemia    Pending labs      Relevant Orders   Lipid panel   Prediabetes    Pending a1c      Relevant Medications   gabapentin (NEURONTIN) 100 MG capsule   Other Relevant Orders    Hemoglobin A1c   Basic metabolic panel   Z61 and Folate Panel   Back pain    Exam supports SI joint dysfunction.  Again retrial of gabapentin.  Consult orthopedics.      Relevant Medications   gabapentin (NEURONTIN) 100 MG capsule   Screening for HIV (human immunodeficiency virus)   Relevant Orders   HIV antibody   Screening for breast cancer    Very over due.  Ordered, patient understands to schedule.      Relevant Orders   MM 3D SCREEN BREAST BILATERAL         I have discontinued Laryah Kriegel's traMADol, chlorpheniramine, and rosuvastatin. I am also having her  start on gabapentin and diclofenac sodium. Additionally, I am having her maintain her apixaban.   Meds ordered this encounter  Medications  . gabapentin (NEURONTIN) 100 MG capsule    Sig: Take 1 capsule (100 mg total) by mouth 3 (three) times daily.    Dispense:  90 capsule    Refill:  3    Order Specific Question:   Supervising Provider    Answer:   Duncan DullULLO, TERESA L [[id number]]  . diclofenac sodium (VOLTAREN) 1 % GEL    Sig: Apply 4 g topically 4 (four) times daily.    Dispense:  1 Tube    Refill:  3    Order Specific Question:   Supervising Provider    Answer:   Sherlene ShamsULLO, TERESA L [[id number]]    Return precautions given.   Risks, benefits, and alternatives of the medications and treatment plan prescribed today were discussed, and patient expressed understanding.   Education regarding symptom management and diagnosis given to patient on AVS.  Continue to follow with Allegra GranaArnett, Mariaha Ellington G, FNP for routine health maintenance.   Nethra Oralia RudSharawakanda and I agreed with plan.   Rennie PlowmanMargaret Daymond Cordts, FNP

## 2017-10-05 NOTE — Assessment & Plan Note (Signed)
Pending a1c. 

## 2017-10-06 LAB — HEPATITIS C ANTIBODY
HEP C AB: NONREACTIVE
SIGNAL TO CUT-OFF: 0.02 (ref <1.00)

## 2017-10-06 LAB — HIV ANTIBODY (ROUTINE TESTING W REFLEX): HIV: NONREACTIVE

## 2017-10-08 ENCOUNTER — Other Ambulatory Visit: Payer: Self-pay | Admitting: Family

## 2017-10-08 DIAGNOSIS — R7989 Other specified abnormal findings of blood chemistry: Secondary | ICD-10-CM

## 2017-10-14 ENCOUNTER — Telehealth: Payer: Self-pay

## 2017-10-14 NOTE — Telephone Encounter (Signed)
Copied from CRM [id number]. Topic: Inquiry >> Oct 14, 2017  1:12 PM Raquel SarnaHayes, Teresa G wrote: Pt needing recent lab results.  Please call pt back asap to give results.

## 2017-10-14 NOTE — Telephone Encounter (Signed)
Left voice mail for patient to call back ok for PEC to speak to patient    

## 2017-10-19 NOTE — Telephone Encounter (Signed)
Spoke with patient and reviewed lab results. 

## 2017-10-24 ENCOUNTER — Encounter: Payer: Self-pay | Admitting: Family

## 2017-11-01 ENCOUNTER — Encounter: Payer: Self-pay | Admitting: Family

## 2017-12-21 ENCOUNTER — Encounter: Payer: Self-pay | Admitting: Family

## 2017-12-22 LAB — HM MAMMOGRAPHY

## 2017-12-30 ENCOUNTER — Telehealth: Payer: Self-pay | Admitting: Family

## 2017-12-30 NOTE — Telephone Encounter (Signed)
Call pt  Mammogram incomplete due to scattered areas of density. [facility] and [facility]? Does she need anything from us to faciliate?

## 2018-01-04 NOTE — Telephone Encounter (Signed)
Spoke to patient she is aware of results and need for additional imaging.  She is in contact with  [facility] and they're in process of seeing if she qualifies for financial assistance.

## 2018-01-06 NOTE — Telephone Encounter (Signed)
noted 

## 2018-01-11 ENCOUNTER — Telehealth: Payer: Self-pay | Admitting: Family

## 2018-01-11 DIAGNOSIS — R928 Other abnormal and inconclusive findings on diagnostic imaging of breast: Secondary | ICD-10-CM

## 2018-01-11 NOTE — Telephone Encounter (Signed)
Copied from CRM [id number]. Topic: General - Other >> Jan 11, 2018  1:48 PM Leafy Ro wrote: Reason for CRM: Leah Roberts [facility] is calling. Leah Roberts needs additional mammogram images on right side breast with possible ultrasound. Leah Roberts should have the report Leah Roberts would like to go to [facility] at [facility] . Leah Roberts does not drive.

## 2018-01-11 NOTE — Telephone Encounter (Signed)
Call debbie at [facility] [place]  I do not see her mammogram result from 12/22/17  It is not in epic nor my scanned files yet.   Can she send?  I do not know what to order

## 2018-01-11 NOTE — Telephone Encounter (Signed)
Needing additional orders for mammogram

## 2018-01-12 NOTE — Telephone Encounter (Signed)
Called and requested report they will be faxing report over

## 2018-01-20 NOTE — Telephone Encounter (Signed)
Spoke with Leah Roberts at [facility]. For additional testing [facility] would [facility], orders are already in system, however they have been taken out due to patient didn't want to schedule additional imaging. . Patient is the process of applying for financial assistance and insurance. She doesn't want to go ahead with testing due to cost. Financial assistance process can take several months.   Patient was referred to social services due to her situations by [facility] .   Patient will get certified letter  from [facility] due to she didn't want to schedule additional imaging.

## 2018-01-20 NOTE — Addendum Note (Signed)
Addended by: Allegra GranaARNETT, MARGARET G on: 01/20/2018 09:08 AM   Modules accepted: Orders

## 2018-01-20 NOTE — Telephone Encounter (Signed)
Noted  Please give courtesy call to patient and offer any help; please encourage her to schedule imaging

## 2018-01-20 NOTE — Telephone Encounter (Signed)
Call Leah Roberts at [facility] We have report It appears asymmetry seen in both MLO and CC projections- then further down report, it says recommendation laterality RIGHT and recommends compression medication of right mammogram and possible ultrasound. I have ordered a diagnostic RIGHT mammogram and also a diagnostic RIGHT ultrasound.  Please confirm this is the correct order and side.   Please also confirm the patient can do this at [facility].   My understanding is [facility].   .  Please confirm ; we need to get this scheduled ASAP

## 2018-01-24 NOTE — Telephone Encounter (Addendum)
Spoke with patient she is willing to [facility].Spoke with Efraim KaufmannMelissa at [facility] and she stated they could see if patient qualified for assistance through [facility] , they would request films through [facility].  She forwarded me to  [facility] and spoke with Neysa BonitoChristy she stated that I needed to fax demographics and order to her at [phone] and she would see if patient qualified for assistance.  [facility].  Patient was  advised  this was being done she should hear from Bishophristy at the cancer center.

## 2018-01-24 NOTE — Telephone Encounter (Signed)
Noted  

## 2018-02-01 ENCOUNTER — Telehealth: Payer: Self-pay | Admitting: Family

## 2018-02-01 NOTE — Telephone Encounter (Signed)
Circling back here as [facility] has refaxed incomplete mammo from 8/22 Has she been scheduled yet at [place]?

## 2018-02-02 NOTE — Telephone Encounter (Signed)
Patient is scheduled 02/07/18 @200pm 

## 2018-02-03 NOTE — Telephone Encounter (Signed)
Noted, thank you

## 2018-02-07 ENCOUNTER — Ambulatory Visit
Admission: RE | Admit: 2018-02-07 | Discharge: 2018-02-07 | Disposition: A | Discharge status: Home / Self Care | Payer: Self-pay | Source: Ambulatory Visit | Attending: Family | Admitting: Family

## 2018-02-07 DIAGNOSIS — R928 Other abnormal and inconclusive findings on diagnostic imaging of breast: Secondary | ICD-10-CM | POA: Insufficient documentation

## 2018-02-13 ENCOUNTER — Ambulatory Visit: Payer: Self-pay

## 2018-02-13 ENCOUNTER — Telehealth: Payer: Self-pay | Admitting: Family

## 2018-02-13 NOTE — Telephone Encounter (Signed)
See result note from Mammogram 02/07/18.

## 2018-02-13 NOTE — Telephone Encounter (Signed)
Pt. Was being given Mammogram results, and as call was wrapping-up, she asked if she could make an appt. for neck pain.  Reported she "has had neck pain for about 2 mos."  Stated the pain starts in the top of the right shoulder, and moves into both sides of the neck.  Stated she feels her pain is moderate.  Denied headache, but stated her head feels heavy.  Reported she has intermittent numbness in the right arm and has constant numbness in the middle knuckles of four fingers of right hand.  Stated she is able to grip with the right hand.  Reported the neck discomfort is not as bad as it was approx. 2 weeks ago.  Also c/o intermittent dizziness; stated this is usually when she changes position from laying to standing, or if sitting for a period of time, it occurs with standing. Denied any injury to her neck.  Stated she has been taking Tylenol for the pain, and doing gentle neck stretching exercises.  Appt. given for 02/15/18 with PCP.  Care advice given per protocol.  Encouraged to call back if symptoms worsen.  Verb. understanding and agrees with plan.      Reason for Disposition . [1] MODERATE neck pain (e.g., interferes with normal activities AND [2] present > 3 days  Answer Assessment - Initial Assessment Questions 1. ONSET: "When did the pain begin?"      About 2 months 2. LOCATION: "Where does it hurt?"      Starts  At top part of right shoulder and moves into both sides of the neck 3. PATTERN "Does the pain come and go, or has it been constant since it started?"      Present most of time and worse at night or in the morning. 4. SEVERITY: "How bad is the pain?"  (Scale 1-10; or mild, moderate, severe)   - MILD (1-3): doesn't interfere with normal activities    - MODERATE (4-7): interferes with normal activities or awakens from sleep    - SEVERE (8-10):  excruciating pain, unable to do any normal activities     moderate 5. RADIATION: "Does the pain go anywhere else, shoot into your arms?"  Stated has intermittent numbness in right arm and hand, with more constant numbness in the middle joints of 2nd through 5th fingers   6. CORD SYMPTOMS: "Any weakness or numbness of the arms or legs?"   Denied weakness; feels a sharp pain in right elbow intermittently 7. CAUSE: "What do you think is causing the neck pain?"    Unsure; denied any injury of neck or shoulder; questions if related to her pillow; changed pillow and changed position, but pain does not go away  8. NECK OVERUSE: "Any recent activities that involved turning or twisting the neck?"     Denied   9. OTHER SYMPTOMS: "Do you have any other symptoms?" (e.g., headache, fever, chest pain, difficulty breathing, neck swelling)     Just feels like her head is heavier; reported some swelling behind left ear and near the left jaw ; denied redness or warmth 10. PREGNANCY: "Is there any chance you are pregnant?" "When was your last menstrual period?"       N/a  Protocols used: NECK PAIN OR STIFFNESS-A-AH

## 2018-02-13 NOTE — Telephone Encounter (Signed)
Copied from CRM [id number]. Topic: Quick Communication - Lab Results (Clinic Use ONLY) >> Feb 13, 2018 11:00 AM Alisia Ferrari, CMA wrote: Called patient to inform them of  lab results. When patient returns call, triage nurse may disclose results.

## 2018-02-15 ENCOUNTER — Encounter: Payer: Self-pay | Admitting: Family

## 2018-02-15 ENCOUNTER — Ambulatory Visit (INDEPENDENT_AMBULATORY_CARE_PROVIDER_SITE_OTHER): Payer: Self-pay | Admitting: Family

## 2018-02-15 DIAGNOSIS — I2699 Other pulmonary embolism without acute cor pulmonale: Secondary | ICD-10-CM

## 2018-02-15 DIAGNOSIS — M542 Cervicalgia: Secondary | ICD-10-CM

## 2018-02-15 DIAGNOSIS — R42 Dizziness and giddiness: Secondary | ICD-10-CM

## 2018-02-15 DIAGNOSIS — R7989 Other specified abnormal findings of blood chemistry: Secondary | ICD-10-CM

## 2018-02-15 DIAGNOSIS — R7303 Prediabetes: Secondary | ICD-10-CM

## 2018-02-15 DIAGNOSIS — Z23 Encounter for immunization: Secondary | ICD-10-CM

## 2018-02-15 LAB — T3, FREE: T3 FREE: 2.8 pg/mL (ref 2.3–4.2)

## 2018-02-15 LAB — CBC WITH DIFFERENTIAL/PLATELET
BASOS ABS: 0.1 10*3/uL (ref 0.0–0.1)
BASOS PCT: 1 % (ref 0.0–3.0)
EOS ABS: 0.1 10*3/uL (ref 0.0–0.7)
Eosinophils Relative: 1.2 % (ref 0.0–5.0)
HEMATOCRIT: 38 % (ref 36.0–46.0)
Hemoglobin: 12.2 g/dL (ref 12.0–15.0)
LYMPHS ABS: 2.4 10*3/uL (ref 0.7–4.0)
LYMPHS PCT: 41.6 % (ref 12.0–46.0)
MCHC: 32.2 g/dL (ref 30.0–36.0)
MCV: 83.7 fl (ref 78.0–100.0)
MONO ABS: 0.4 10*3/uL (ref 0.1–1.0)
Monocytes Relative: 6.7 % (ref 3.0–12.0)
NEUTROS ABS: 2.9 10*3/uL (ref 1.4–7.7)
NEUTROS PCT: 49.5 % (ref 43.0–77.0)
PLATELETS: 231 10*3/uL (ref 150.0–400.0)
RBC: 4.54 Mil/uL (ref 3.87–5.11)
RDW: 14.2 % (ref 11.5–15.5)
WBC: 5.8 10*3/uL (ref 4.0–10.5)

## 2018-02-15 LAB — T4, FREE: FREE T4: 0.79 ng/dL (ref 0.60–1.60)

## 2018-02-15 LAB — HEMOGLOBIN A1C: Hgb A1c MFr Bld: 6.2 % (ref 4.6–6.5)

## 2018-02-15 LAB — TSH: TSH: 3.42 u[IU]/mL (ref 0.35–4.50)

## 2018-02-15 MED ORDER — DICLOFENAC SODIUM 1 % TD GEL: 4.0000 g | Freq: Four times a day (QID) | TRANSDERMAL | 3 refills | Status: DC

## 2018-02-15 MED ORDER — LIDOCAINE 5 % EX PTCH: 1.0000 | MEDICATED_PATCH | CUTANEOUS | 1 refills | Status: DC

## 2018-02-15 NOTE — Assessment & Plan Note (Signed)
Acute. Discussed medication therapies on avs. Advised to take gabapentin BID.  Currently self pay and declines ortho consult today. She will let me know If no improvement.

## 2018-02-15 NOTE — Assessment & Plan Note (Signed)
Appears stable. Advised to stay on eliquis.

## 2018-02-15 NOTE — Patient Instructions (Addendum)
Increase gabapentin to twice times per day; go back to once per day if you feel to sleepy  May also use heat, tyleonol arthritis, lidocaine patches, voltaren gel. All of things will help back and neck pain.    You have orthostatic hypotension. Careful when going from sitting to standing  MORE water- LIMIT caffeine.   If back or neck pain is not improved, please call the office and make a follow up with me. Please let me know if you would like to see orthopedics.    Orthostatic Hypotension Orthostatic hypotension is a sudden drop in blood pressure that happens when you quickly change positions, such as when you get up from a seated or lying position. Blood pressure is a measurement of how strongly, or weakly, your blood is pressing against the walls of your arteries. Arteries are blood vessels that carry blood from your heart throughout your body. When blood pressure is too low, you may not get enough blood to your brain or to the rest of your organs. This can cause weakness, light-headedness, rapid heartbeat, and fainting. This can last for just a few seconds or for up to a few minutes. Orthostatic hypotension is usually not a serious problem. However, if it happens frequently or gets worse, it may be a sign of something more serious. What are the causes? This condition may be caused by:  Sudden changes in posture, such as standing up quickly after you have been sitting or lying down.  Blood loss.  Loss of body fluids (dehydration).  Heart problems.  Hormone (endocrine) problems.  Pregnancy.  Severe infection.  Lack of certain nutrients.  Severe allergic reactions (anaphylaxis).  Certain medicines, such as blood pressure medicine or medicines that make the body lose excess fluids (diuretics). Sometimes, this condition can be caused by not taking medicine as directed, such as taking too much of a certain medicine.  What increases the risk? Certain factors can make you more  likely to develop orthostatic hypotension, including:  Age. Risk increases as you get older.  Conditions that affect the heart or the central nervous system.  Taking certain medicines, such as blood pressure medicine or diuretics.  Being pregnant.  What are the signs or symptoms? Symptoms of this condition may include:  Weakness.  Light-headedness.  Dizziness.  Blurred vision.  Fatigue.  Rapid heartbeat.  Fainting, in severe cases.  How is this diagnosed? This condition is diagnosed based on:  Your medical history.  Your symptoms.  Your blood pressure measurement. Your health care provider will check your blood pressure when you are: ? Lying down. ? Sitting. ? Standing.  A blood pressure reading is recorded as two numbers, such as "120 over 80" (or 120/80). The first ("top") number is called the systolic pressure. It is a measure of the pressure in your arteries as your heart beats. The second ("bottom") number is called the diastolic pressure. It is a measure of the pressure in your arteries when your heart relaxes between beats. Blood pressure is measured in a unit called mm Hg. Healthy blood pressure for adults is 120/80. If your blood pressure is below 90/60, you may be diagnosed with hypotension. Other information or tests that may be used to diagnose orthostatic hypotension include:  Your other vital signs, such as your heart rate and temperature.  Blood tests.  Tilt table test. For this test, you will be safely secured to a table that moves you from a lying position to an upright position. Your heart  rhythm and blood pressure will be monitored during the test.  How is this treated? Treatment for this condition may include:  Changing your diet. This may involve eating more salt (sodium) or drinking more water.  Taking medicines to raise your blood pressure.  Changing the dosage of certain medicines you are taking that might be lowering your blood  pressure.  Wearing compression stockings. These stockings help to prevent blood clots and reduce swelling in your legs.  In some cases, you may need to go to the hospital for:  Fluid replacement. This means you will receive fluids through an IV tube.  Blood replacement. This means you will receive donated blood through an IV tube (transfusion).  Treating an infection or heart problems, if this applies.  Monitoring. You may need to be monitored while medicines that you are taking wear off.  Follow these instructions at home: Eating and drinking   Drink enough fluid to keep your urine clear or pale yellow.  Eat a healthy diet and follow instructions from your health care provider about eating or drinking restrictions. A healthy diet includes: ? Fresh fruits and vegetables. ? Whole grains. ? Lean meats. ? Low-fat dairy products.  Eat extra salt only as directed. Do not add extra salt to your diet unless your health care provider told you to do that.  Eat frequent, small meals.  Avoid standing up suddenly after eating. Medicines  Take over-the-counter and prescription medicines only as told by your health care provider. ? Follow instructions from your health care provider about changing the dosage of your current medicines, if this applies. ? Do not stop or adjust any of your medicines on your own. General instructions  Wear compression stockings as told by your health care provider.  Get up slowly from lying down or sitting positions. This gives your blood pressure a chance to adjust.  Avoid hot showers and excessive heat as directed by your health care provider.  Return to your normal activities as told by your health care provider. Ask your health care provider what activities are safe for you.  Do not use any products that contain nicotine or tobacco, such as cigarettes and e-cigarettes. If you need help quitting, ask your health care provider.  Keep all follow-up  visits as told by your health care provider. This is important. Contact a health care provider if:  You vomit.  You have diarrhea.  You have a fever for more than 2-3 days.  You feel more thirsty than usual.  You feel weak and tired. Get help right away if:  You have chest pain.  You have a fast or irregular heartbeat.  You develop numbness in any part of your body.  You cannot move your arms or your legs.  You have trouble speaking.  You become sweaty or feel lightheaded.  You faint.  You feel short of breath.  You have trouble staying awake.  You feel confused. This information is not intended to replace advice given to you by your health care provider. Make sure you discuss any questions you have with your health care provider. Document Released: 04/09/2002 Document Revised: 01/06/2016 Document Reviewed: 10/10/2015 Elsevier Interactive Patient Education  2018 [employer].

## 2018-02-15 NOTE — Assessment & Plan Note (Signed)
Appears orthostatic ( see flow sheet). Education provided. Will follow.

## 2018-02-15 NOTE — Progress Notes (Signed)
Subjective:    Patient ID: Leah Roberts, female    DOB: [birth year], 67 y.o.   MRN: [id number]  CC: Leah Roberts is a 67 y.o. female who presents today for an acute visit.    HPI: CC: neck pain across both shoulder and radiates to left side of neck x 2 months, waxes and wanes. Bought a new a pillow which didn't help. Tried voltaren with some relief. On gabapentin, 1 tablet before bed if feeling pain.  Some thoracic back pain. No rash. No h/o cancer. No problems urinating. No falls.   Has intermittent numbness in BL toes, gabapentin helps.  Also complains of dizziness x 2 months, waxes and wanes.  Episodes such going to bathroom, if gets up 'straight away' will feel dizzy. No cp, ha, vision changes, ear pain, congestion, syncope, palpitations. Drinking a lot of water. Drinking 1 cup coffee. No alcohol, drugs.   H/o PE ( 2). On eliquis. NO nose bleeds, blood in stool     HISTORY:  Past Medical History:  Diagnosis Date  . DVT (deep venous thrombosis) (HCC)   . DVT (deep venous thrombosis) (HCC) 2013  . History of hypertension   . Hypertension   . Pulmonary embolism (HCC) 2013   Past Surgical History:  Procedure Laterality Date  . APPENDECTOMY    . TONSILLECTOMY AND ADENOIDECTOMY     Family History  Problem Relation Age of Onset  . Hypertension Father   . Heart disease Father   . Thyroid disease Sister   . Allergies Sister     Allergies: Sulfa antibiotics and Sulfa antibiotics Current Outpatient Medications on File Prior to Visit  Medication Sig Dispense Refill  . acetaminophen (TYLENOL) 500 MG tablet Take 500 mg by mouth every 6 (six) hours as needed.    Marland Kitchen apixaban (ELIQUIS) 2.5 MG TABS tablet Take 1 tablet (2.5 mg total) by mouth 2 (two) times daily. 180 tablet 0  . gabapentin (NEURONTIN) 100 MG capsule Take 1 capsule (100 mg total) by mouth 3 (three) times daily. 90 capsule 3   No current facility-administered medications on file prior to visit.      Social History   Tobacco Use  . Smoking status: Never Smoker  . Smokeless tobacco: Never Used  Substance Use Topics  . Alcohol use: No  . Drug use: No    Review of Systems  Constitutional: Negative for chills and fever.  Eyes: Negative for visual disturbance.  Respiratory: Negative for cough.   Cardiovascular: Negative for chest pain and palpitations.  Gastrointestinal: Negative for nausea and vomiting.  Genitourinary: Negative for difficulty urinating.  Musculoskeletal: Positive for back pain and neck pain. Negative for neck stiffness.  Neurological: Positive for dizziness and numbness (BL toes). Negative for weakness and headaches.  Hematological: Does not bruise/bleed easily.      Objective:    SpO2 97%  100/70 - see orthostatic flow sheet  Physical Exam  Constitutional: She appears well-developed and well-nourished.  HENT:  Mouth/Throat: Uvula is midline, oropharynx is clear and moist and mucous membranes are normal.  Eyes: Pupils are equal, round, and reactive to light. Conjunctivae and EOM are normal.  Fundus normal bilaterally.   Cardiovascular: Normal rate, regular rhythm, normal heart sounds and normal pulses.  Pulmonary/Chest: Effort normal and breath sounds normal. She has no wheezes. She has no rhonchi. She has no rales.  Musculoskeletal:       Cervical back: She exhibits pain. She exhibits normal range of motion, no tenderness, no bony tenderness,  no swelling and no spasm.       Thoracic back: She exhibits tenderness and pain. She exhibits normal range of motion, no swelling, no edema, no laceration and no spasm.       Back:  Generalized tenderness as marked on diagram. No rash.   Neurological: She is alert. She has normal strength. No cranial nerve deficit or sensory deficit. She displays a negative Romberg sign.  Reflex Scores:      Bicep reflexes are 2+ on the right side and 2+ on the left side.      Patellar reflexes are 2+ on the right side and 2+  on the left side. Grip equal and strong bilateral upper extremities. Gait strong and steady. Able to perform rapid alternating movement without difficulty.   Skin: Skin is warm and dry.  Psychiatric: She has a normal mood and affect. Her speech is normal and behavior is normal. Thought content normal.  Vitals reviewed.      Assessment & Plan:   Problem List Items Addressed This Visit      Cardiovascular and Mediastinum   Pulmonary embolism (HCC)    Appears stable. Advised to stay on eliquis.         Other   Prediabetes   Relevant Orders   Hemoglobin A1c   Neck pain - Primary    Acute. Discussed medication therapies on avs. Advised to take gabapentin BID.  Currently self pay and declines ortho consult today. She will let me know If no improvement.      Relevant Medications   diclofenac sodium (VOLTAREN) 1 % GEL   lidocaine (LIDODERM) 5 %   Dizziness    Appears orthostatic ( see flow sheet). Education provided. Will follow.       Relevant Orders   CBC with Differential/Platelet   Elevated TSH        I am having Leah Roberts start on lidocaine. I am also having her maintain her apixaban, gabapentin, acetaminophen, and diclofenac sodium.   Meds ordered this encounter  Medications  . diclofenac sodium (VOLTAREN) 1 % GEL    Sig: Apply 4 g topically 4 (four) times daily.    Dispense:  1 Tube    Refill:  3    Order Specific Question:   Supervising Provider    Answer:   Darrick Huntsman, TERESA L [[id number]]  . lidocaine (LIDODERM) 5 %    Sig: Place 1 patch onto the skin daily. Remove & Discard patch within 12 hours.    Dispense:  30 patch    Refill:  1    Order Specific Question:   Supervising Provider    Answer:   Sherlene Shams [[id number]]    Return precautions given.   Risks, benefits, and alternatives of the medications and treatment plan prescribed today were discussed, and patient expressed understanding.   Education regarding symptom management and diagnosis given  to patient on AVS.  Continue to follow with Allegra Grana, FNP for routine health maintenance.   Leah Roberts and I agreed with plan.   Rennie Plowman, FNP

## 2018-03-09 IMAGING — DX DG KNEE COMPLETE 4+V*L*
5 series · 5 of 5 positions shown · non-contrast
Comparison: None.

CLINICAL DATA: Left knee pain.

EXAM:
LEFT KNEE - COMPLETE 4+ VIEW

[knee standing ap]
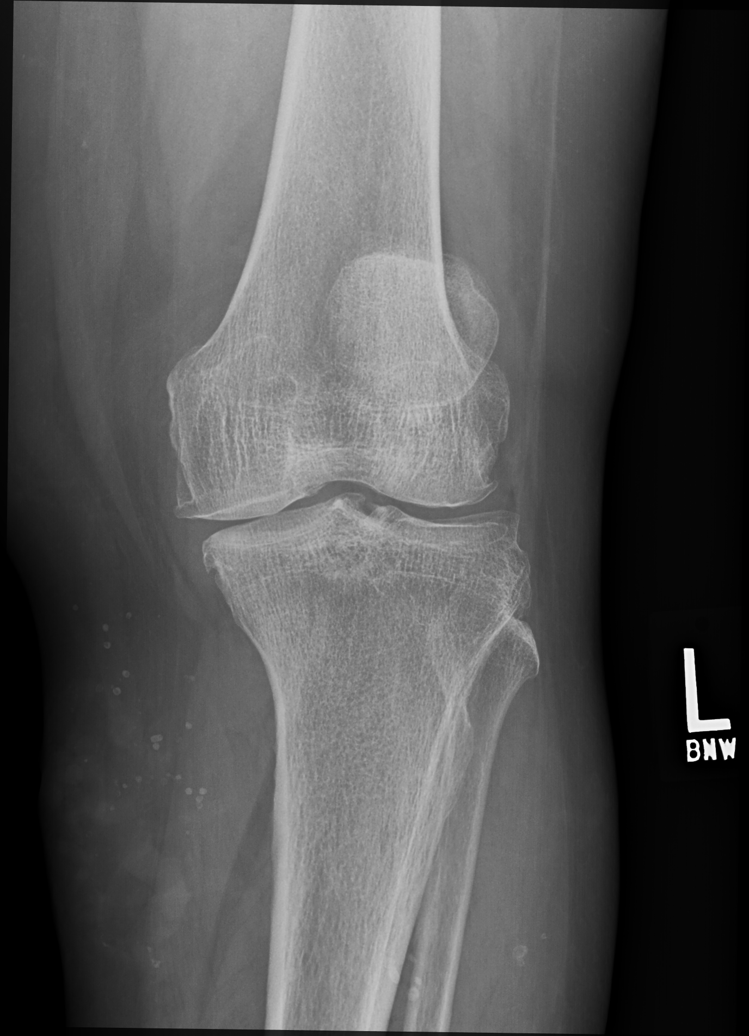

[knee standing external ap]
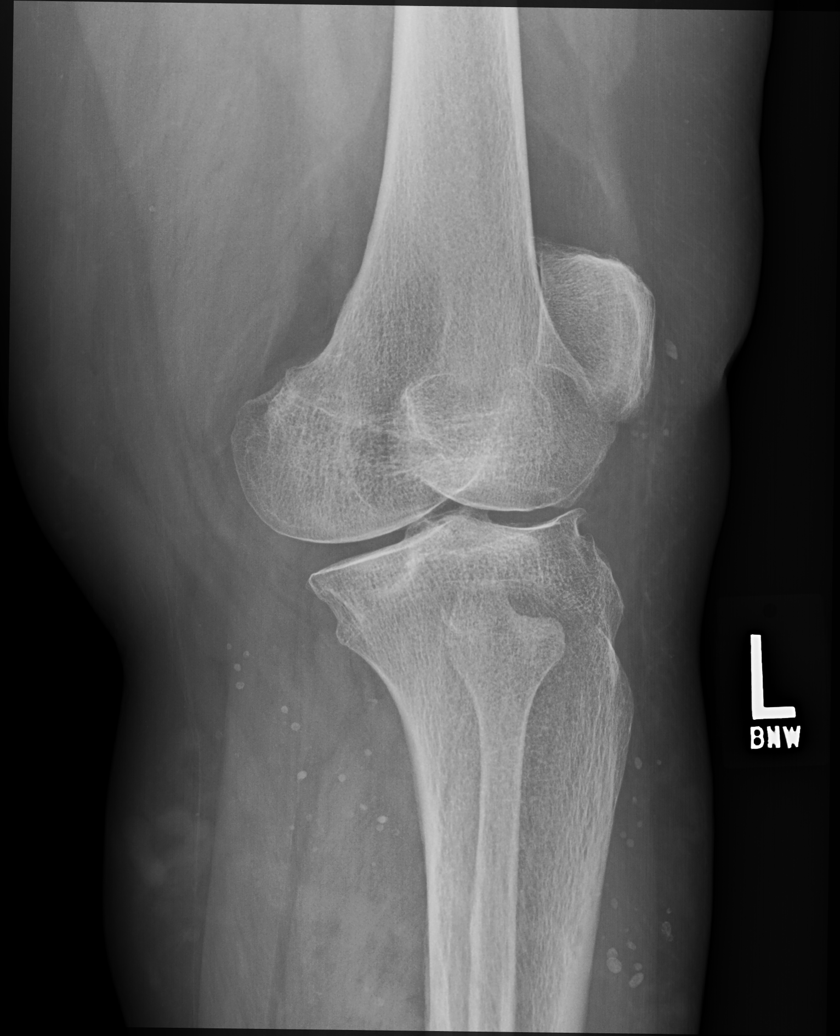

[knee standing internal ap]
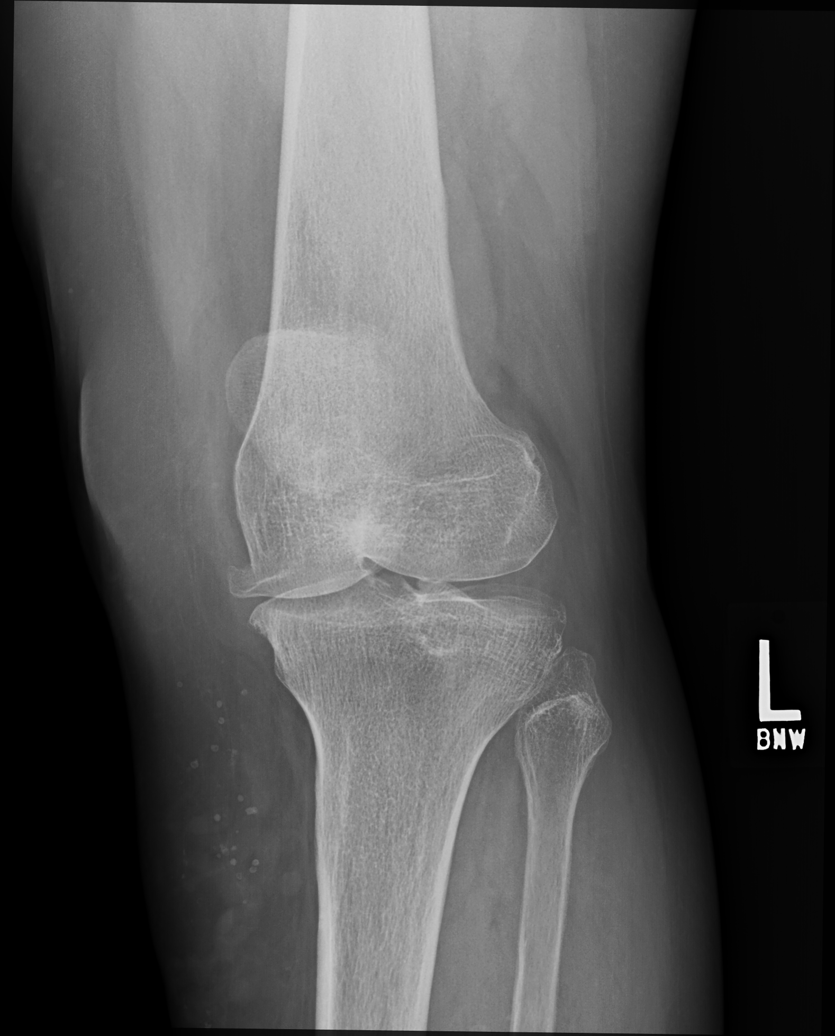

[knee standing lat]
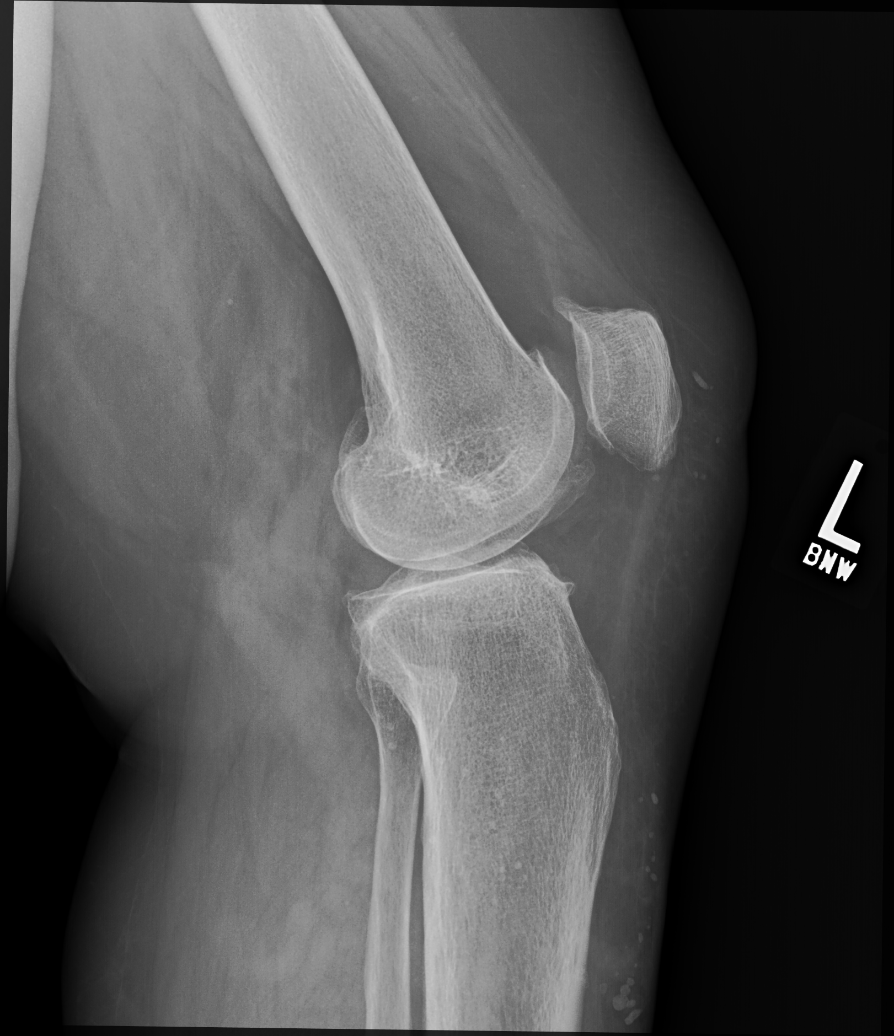

[knee [person_name] view pa]
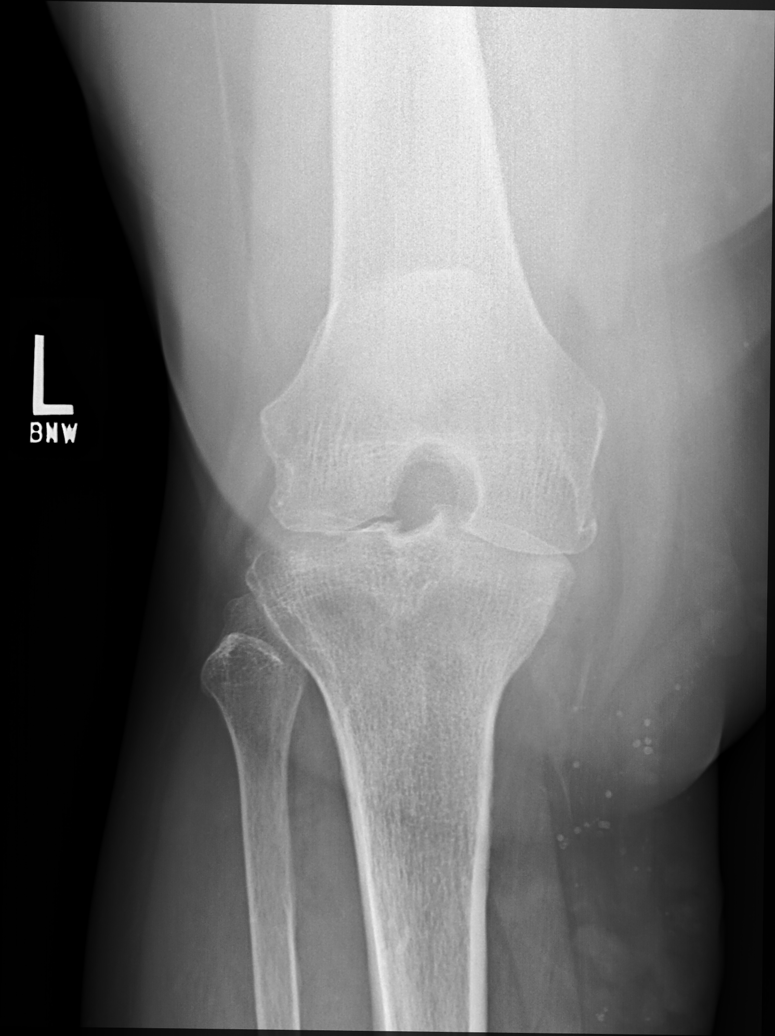

[5 of 5 positions shown; findings below may reference images not displayed]

FINDINGS: No evidence of fracture, dislocation, or joint effusion. Mild
narrowing of medial joint space is noted with osteophyte formation.
Soft tissues are unremarkable.
IMPRESSION: Mild degenerative joint disease is noted medially. No acute
abnormality seen in the left knee.

## 2018-06-26 ENCOUNTER — Ambulatory Visit
Admission: RE | Admit: 2018-06-26 | Discharge: 2018-06-26 | Disposition: A | Discharge status: Home / Self Care | Payer: Self-pay | Source: Ambulatory Visit | Attending: Family Medicine | Admitting: Family Medicine

## 2018-06-26 ENCOUNTER — Other Ambulatory Visit: Payer: Self-pay | Admitting: Family Medicine

## 2018-06-26 DIAGNOSIS — R7611 Nonspecific reaction to tuberculin skin test without active tuberculosis: Secondary | ICD-10-CM

## 2018-06-30 ENCOUNTER — Telehealth: Payer: Self-pay | Admitting: *Deleted

## 2018-06-30 NOTE — Telephone Encounter (Signed)
Yes, please schedule.

## 2018-06-30 NOTE — Telephone Encounter (Signed)
Copied from CRM [id number]. Topic: General - Other >> Jun 30, 2018 11:08 AM Herby Abraham C wrote: Reason for CRM: Mettea and Wilkie Aye  Sites with the local health department is calling in to speak with PCP or CMA in regards to pt. She states that pt was sent to them, they were advised by their medical director to refer pt back to PCP due to pt's chest x-ray. Pt need to have a f/u for CT scan and also Quantiferon Gold test.    They are stating no call back is needed unless there are questions.   Phone: [phone] - okay to lvm

## 2018-07-03 NOTE — Telephone Encounter (Signed)
I want to circle back as I believe you spoke with patient.  Is she going to come in and see [place]?

## 2018-07-12 ENCOUNTER — Ambulatory Visit (INDEPENDENT_AMBULATORY_CARE_PROVIDER_SITE_OTHER): Payer: Self-pay | Admitting: Family

## 2018-07-12 ENCOUNTER — Other Ambulatory Visit: Payer: Self-pay

## 2018-07-12 ENCOUNTER — Encounter: Payer: Self-pay | Admitting: Family

## 2018-07-12 VITALS — BP 116/70 | HR 103 | Temp 98.1°F | Wt 222.4 lb

## 2018-07-12 DIAGNOSIS — R7611 Nonspecific reaction to tuberculin skin test without active tuberculosis: Secondary | ICD-10-CM | POA: Insufficient documentation

## 2018-07-12 DIAGNOSIS — M2550 Pain in unspecified joint: Secondary | ICD-10-CM | POA: Insufficient documentation

## 2018-07-12 DIAGNOSIS — M25541 Pain in joints of right hand: Secondary | ICD-10-CM

## 2018-07-12 DIAGNOSIS — R911 Solitary pulmonary nodule: Secondary | ICD-10-CM | POA: Insufficient documentation

## 2018-07-12 DIAGNOSIS — Z111 Encounter for screening for respiratory tuberculosis: Secondary | ICD-10-CM

## 2018-07-12 NOTE — Assessment & Plan Note (Signed)
Benign exam today.  Offered ordering rheumatoid arthritis labs as well as x-ray today.  Due to financial situation, patient politely declined.  She felt comfortable monitoring and treating with Voltaren gel.  She let me know if that were to worsen, change

## 2018-07-12 NOTE — Assessment & Plan Note (Signed)
Asymptomatic.  Awaiting call from Leah Roberts department.  Also message Leah Roberts, with infectious disease in regards to advice about management while we await QuantiFERON gold.  Patient had BCG as a child.

## 2018-07-12 NOTE — Progress Notes (Signed)
Subjective:    Patient ID: Leah Roberts, female    DOB: 09-20-1950, 68 y.o.   MRN: [id number]  CC: Leah Roberts is a 68 y.o. female who presents today for follow up.   HPI: From [place].   Lived in [place] for 4 years, left 2008, went back to [place], [place]. [place] to [place] in [place] , hasnt been back .  Had BCG vaccine as child.   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   PPD positive. No cough, fever, weight loss.   Right lung nodule seen on CXR. .h/o right sided PE 2010.  Never smoker.  ON eliquis. No swelling in legs, sob, cp.   Swelling at night in right 3rd finger, worse at night, for past couple of years.  Sometimes in left hand however more mild.  Stiffness. No h/o RA. Does a lot of gardening. Right handed.   Back pain- doing well gabapentin.      Mammogram UTD  HISTORY:  Past Medical History:  Diagnosis Date  . DVT (deep venous thrombosis) (HCC)   . DVT (deep venous thrombosis) (HCC) 2013  . History of hypertension   . Hypertension   . Pulmonary embolism (HCC) 2013   Past Surgical History:  Procedure Laterality Date  . APPENDECTOMY    . TONSILLECTOMY AND ADENOIDECTOMY     Family History  Problem Relation Age of Onset  . Hypertension Father   . Heart disease Father   . Thyroid disease Sister   . Allergies Sister     Allergies: Sulfa antibiotics and Sulfa antibiotics Current Outpatient Medications on File Prior to Visit  Medication Sig Dispense Refill  . acetaminophen (TYLENOL) 500 MG tablet Take 500 mg by mouth every 6 (six) hours as needed.    Marland Kitchen apixaban (ELIQUIS) 2.5 MG TABS tablet Take 1 tablet (2.5 mg total) by mouth 2 (two) times daily. 180 tablet 0  . diclofenac sodium (VOLTAREN) 1 % GEL Apply 4 g topically 4 (four) times daily. 1 Tube 3  . gabapentin (NEURONTIN) 100 MG capsule Take 1 capsule (100 mg total) by mouth 3 (three) times daily. 90 capsule 3  . lidocaine (LIDODERM) 5 %  Place 1 patch onto the skin daily. Remove & Discard patch within 12 hours. 30 patch 1   No current facility-administered medications on file prior to visit.     Social History   Tobacco Use  . Smoking status: Never Smoker  . Smokeless tobacco: Never Used  Substance Use Topics  . Alcohol use: No  . Drug use: No    Review of Systems  Constitutional: Negative for chills, diaphoresis, fatigue, fever and unexpected weight change.  Respiratory: Negative for cough, shortness of breath and wheezing.   Cardiovascular: Negative for chest pain and palpitations.  Gastrointestinal: Negative for nausea and vomiting.  Musculoskeletal: Positive for arthralgias and back pain (improved).      Objective:    BP 116/70 (BP Location: Left Arm, Patient Position: Sitting, Cuff Size: Large)   Pulse (!) 103   Temp 98.1 F (36.7 C)   Wt 222 lb 6.4 oz (100.9 kg)   SpO2 97%   BMI 36.17 kg/m  BP Readings from Last 3 Encounters:  07/12/18 116/70  10/05/17 124/74  01/25/17 138/90   Wt Readings from Last 3 Encounters:  07/12/18 222 lb 6.4 oz (100.9 kg)  10/05/17 219 lb (99.3 kg)  01/25/17 219 lb 3.2 oz (99.4 kg)  Physical Exam Vitals signs reviewed.  Constitutional:      Appearance: She is well-developed.  Eyes:     Conjunctiva/sclera: Conjunctivae normal.  Cardiovascular:     Rate and Rhythm: Normal rate and regular rhythm.     Pulses: Normal pulses.     Heart sounds: Normal heart sounds.  Pulmonary:     Effort: Pulmonary effort is normal.     Breath sounds: Normal breath sounds. No wheezing, rhonchi or rales.  Musculoskeletal:     Right wrist: She exhibits normal range of motion, no tenderness and no swelling.     Left wrist: She exhibits normal range of motion, no tenderness and no swelling.     Right hand: She exhibits normal range of motion, no tenderness and no laceration. Normal sensation noted. Normal strength noted.     Left hand: She exhibits normal range of motion, no  tenderness and no swelling. Normal sensation noted. Normal strength noted.       Hands:     Comments: Patient describes swelling third metacarpal PIP joint, as marked on diagram.  Normal exam today.  No edema.  Negative Tinel, Phalens  Skin:    General: Skin is warm and dry.  Neurological:     Mental Status: She is alert.  Psychiatric:        Speech: Speech normal.        Behavior: Behavior normal.        Thought Content: Thought content normal.        Assessment & Plan:   Problem List Items Addressed This Visit      Other   Positive PPD - Primary    Asymptomatic.  Awaiting call from [facility] department.  Also message Dr. Luciana Axe, with infectious disease in regards to advice about management while we await QuantiFERON gold.  Patient had BCG as a child.       Relevant Orders   QuantiFERON-TB Gold Plus   Arthralgia    Benign exam today.  Offered ordering rheumatoid arthritis labs as well as x-ray today.  Due to financial situation, patient politely declined.  She felt comfortable monitoring and treating with Voltaren gel.  She let me know if that were to worsen, change      Solitary pulmonary nodule    H/o PE. Pending CT chest.       Relevant Orders   CT Chest Wo Contrast       I am having Quanasia Zurcher maintain her apixaban, gabapentin, acetaminophen, diclofenac sodium, and lidocaine.   No orders of the defined types were placed in this encounter.   Return precautions given.   Risks, benefits, and alternatives of the medications and treatment plan prescribed today were discussed, and patient expressed understanding.   Education regarding symptom management and diagnosis given to patient on AVS.  Continue to follow with Allegra Grana, FNP for routine health maintenance.   Gilberte Oralia Rud and I agreed with plan.   Rennie Plowman, FNP

## 2018-07-12 NOTE — Assessment & Plan Note (Signed)
H/o PE. Pending CT chest.

## 2018-07-12 NOTE — Patient Instructions (Signed)
We will you with results.   Today we discussed referrals, orders. CT chest.     I have placed these orders in the system for you.  Please be sure to give [place] a call if you have not heard from our office regarding this. We should hear from [place] within ONE week with information regarding your appointment. If not, please let me know immediately.

## 2018-07-14 ENCOUNTER — Telehealth: Payer: Self-pay

## 2018-07-14 ENCOUNTER — Telehealth: Payer: Self-pay | Admitting: Family

## 2018-07-14 LAB — QUANTIFERON-TB GOLD PLUS
Mitogen-NIL: >10 IU/mL
NIL: 0.02 IU/mL
QuantiFERON-TB Gold Plus: NEGATIVE
TB1-NIL: 0.03 IU/mL
TB2-NIL: 0.01 IU/mL

## 2018-07-14 NOTE — Telephone Encounter (Signed)
Leah Roberts with [facility] Dept called and said she needs to speak with the nurse regarding the patient's CT that she has scheduled. She wants to know was the patient still going to have this? [phone]

## 2018-07-14 NOTE — Telephone Encounter (Signed)
Call pt See note from Dr Luciana Axe who is infectious disease doctor in regard to positive PPD.  Basically she will need to go back to the HD. They will need to check an AFB culture. She would not need isolation unless her AFB sputum is positive, then it will be mandated by the HD. It would be good for her to limit community interaction if she can.     Call [place] [facility] dept and inform them of infectious disease recommendation. Please make an appt for HD to collect AFB culture or have them contact patient as well.

## 2018-07-14 NOTE — Telephone Encounter (Signed)
noted 

## 2018-07-14 NOTE — Telephone Encounter (Signed)
I spoke with Leah Roberts, Medical Director of HD. She stated that since Quantiferon Gold was negative that AFB culture was not needed. The CT scan would be up to your decreation. She stated that she is a Producer, television/film/video & she would sent you a message regarding this as well.

## 2018-07-14 NOTE — Telephone Encounter (Signed)
NOTED

## 2018-07-14 NOTE — Telephone Encounter (Signed)
Please call HD  quantiferon gold negative. Assume she was still need a AFB culture?   Please advise

## 2018-07-14 NOTE — Telephone Encounter (Signed)
-----   Message from Gardiner Barefoot, MD sent at 07/13/2018 10:24 AM EDT ----- Basically she will need to go back to the HD.  They will need to check an AFB culture.  She would not need isolation unless her AFB sputum is positive, then it will be mandated by the HD.  It would be good for her to limit community interaction if she can.   ----- Message ----- From: Allegra Grana, FNP Sent: 07/12/2018   3:11 PM EDT To: Gardiner Barefoot, MD  Dr Luciana Axe,  I hesitated to bother you with this question however I do not often encounter this to primary care.  Patient had a positive PPD at the health department.  She is originally from [place], spending some time in the [place].  She has lived [place] the states for [place].  She had BCG vaccine as a child.  Asymptomatic today.  Pending QuantiFERON gold, and CT chest as an x-ray picked up a lung nodule.  Should I have this patient do anything differently as she interacts with the community?  Just want to be sure was not releasing her without giving this some thought. Thanks for your advice here, Claris Che, NP Cell [phone]

## 2018-07-14 NOTE — Telephone Encounter (Signed)
I called HD & spoke with Danford Bad she stated that if I would call patient to let her know they would reach out to hr in regards to AFB sputum culture. If patient tested positive HD would be sending out the proper notifications, as well putting patient in isolation. I also called patient to let her know that HD would be calling to get this scheduled & she should be hearing about CT scan.

## 2018-07-14 NOTE — Telephone Encounter (Signed)
Copied from CRM [id number]. Topic: General - Inquiry >> Jul 14, 2018  3:35 PM Terisa Starr wrote: Reason for CRM: Cala Bradford, Medical Director of [facility] Dept would like to speak with Rennie Plowman, NP regarding her positive PPD test. Call back to her desk @ [phone]

## 2018-08-16 ENCOUNTER — Ambulatory Visit
Admission: RE | Admit: 2018-08-16 | Discharge: 2018-08-16 | Disposition: A | Discharge status: Home / Self Care | Payer: Self-pay | Source: Ambulatory Visit | Attending: Family | Admitting: Family

## 2018-08-16 ENCOUNTER — Other Ambulatory Visit: Payer: Self-pay

## 2018-08-16 DIAGNOSIS — R911 Solitary pulmonary nodule: Secondary | ICD-10-CM

## 2018-08-23 ENCOUNTER — Encounter: Payer: Self-pay | Admitting: Family

## 2018-08-23 ENCOUNTER — Other Ambulatory Visit: Payer: Self-pay | Admitting: Family

## 2018-08-23 ENCOUNTER — Other Ambulatory Visit: Payer: Self-pay

## 2018-08-23 DIAGNOSIS — I2699 Other pulmonary embolism without acute cor pulmonale: Secondary | ICD-10-CM

## 2018-08-23 DIAGNOSIS — M549 Dorsalgia, unspecified: Secondary | ICD-10-CM

## 2018-08-23 DIAGNOSIS — G8929 Other chronic pain: Secondary | ICD-10-CM

## 2018-08-23 DIAGNOSIS — R7303 Prediabetes: Secondary | ICD-10-CM

## 2018-08-23 DIAGNOSIS — M25562 Pain in left knee: Secondary | ICD-10-CM

## 2018-08-23 DIAGNOSIS — I7090 Unspecified atherosclerosis: Secondary | ICD-10-CM | POA: Insufficient documentation

## 2018-08-23 DIAGNOSIS — M542 Cervicalgia: Secondary | ICD-10-CM

## 2018-08-23 MED ORDER — GABAPENTIN 100 MG PO CAPS: 100.0000 mg | ORAL_CAPSULE | Freq: Three times a day (TID) | ORAL | 3 refills | Status: DC

## 2018-08-23 MED ORDER — DICLOFENAC SODIUM 1 % TD GEL: 4.0000 g | Freq: Four times a day (QID) | TRANSDERMAL | 3 refills | Status: DC

## 2018-08-23 MED ORDER — ROSUVASTATIN CALCIUM 10 MG PO TABS: 10.0000 mg | ORAL_TABLET | Freq: Every day | ORAL | 3 refills | Status: DC

## 2018-08-23 MED ORDER — APIXABAN 2.5 MG PO TABS: 2.5000 mg | ORAL_TABLET | Freq: Two times a day (BID) | ORAL | 0 refills | Status: DC

## 2019-12-31 ENCOUNTER — Encounter: Payer: Self-pay | Admitting: Family

## 2019-12-31 ENCOUNTER — Inpatient Hospital Stay
Admission: EM | Admit: 2019-12-31 | Discharge: 2020-01-02 | DRG: 311 | Disposition: A | Discharge status: Home / Self Care | Payer: Self-pay | Attending: Hospitalist | Admitting: Hospitalist

## 2019-12-31 ENCOUNTER — Ambulatory Visit (INDEPENDENT_AMBULATORY_CARE_PROVIDER_SITE_OTHER): Payer: Self-pay | Admitting: Nurse Practitioner

## 2019-12-31 ENCOUNTER — Emergency Department: Payer: Self-pay

## 2019-12-31 ENCOUNTER — Encounter: Payer: Self-pay | Admitting: Nurse Practitioner

## 2019-12-31 ENCOUNTER — Other Ambulatory Visit: Payer: Self-pay

## 2019-12-31 ENCOUNTER — Encounter: Payer: Self-pay | Admitting: Emergency Medicine

## 2019-12-31 VITALS — BP 120/82 | HR 83 | Temp 98.2°F | Ht 66.0 in | Wt 223.0 lb

## 2019-12-31 DIAGNOSIS — E785 Hyperlipidemia, unspecified: Secondary | ICD-10-CM | POA: Diagnosis present

## 2019-12-31 DIAGNOSIS — R7611 Nonspecific reaction to tuberculin skin test without active tuberculosis: Secondary | ICD-10-CM | POA: Diagnosis present

## 2019-12-31 DIAGNOSIS — I10 Essential (primary) hypertension: Secondary | ICD-10-CM | POA: Diagnosis present

## 2019-12-31 DIAGNOSIS — Z86718 Personal history of other venous thrombosis and embolism: Secondary | ICD-10-CM

## 2019-12-31 DIAGNOSIS — I249 Acute ischemic heart disease, unspecified: Secondary | ICD-10-CM

## 2019-12-31 DIAGNOSIS — Z882 Allergy status to sulfonamides status: Secondary | ICD-10-CM

## 2019-12-31 DIAGNOSIS — D649 Anemia, unspecified: Secondary | ICD-10-CM | POA: Diagnosis present

## 2019-12-31 DIAGNOSIS — Z79899 Other long term (current) drug therapy: Secondary | ICD-10-CM

## 2019-12-31 DIAGNOSIS — Z7901 Long term (current) use of anticoagulants: Secondary | ICD-10-CM

## 2019-12-31 DIAGNOSIS — Z86711 Personal history of pulmonary embolism: Secondary | ICD-10-CM

## 2019-12-31 DIAGNOSIS — R0600 Dyspnea, unspecified: Secondary | ICD-10-CM

## 2019-12-31 DIAGNOSIS — R0609 Other forms of dyspnea: Secondary | ICD-10-CM | POA: Insufficient documentation

## 2019-12-31 DIAGNOSIS — I8390 Asymptomatic varicose veins of unspecified lower extremity: Secondary | ICD-10-CM | POA: Diagnosis present

## 2019-12-31 DIAGNOSIS — R7303 Prediabetes: Secondary | ICD-10-CM | POA: Diagnosis present

## 2019-12-31 DIAGNOSIS — Z8349 Family history of other endocrine, nutritional and metabolic diseases: Secondary | ICD-10-CM

## 2019-12-31 DIAGNOSIS — I200 Unstable angina: Principal | ICD-10-CM | POA: Diagnosis present

## 2019-12-31 DIAGNOSIS — Z20822 Contact with and (suspected) exposure to covid-19: Secondary | ICD-10-CM | POA: Diagnosis present

## 2019-12-31 DIAGNOSIS — I2699 Other pulmonary embolism without acute cor pulmonale: Secondary | ICD-10-CM

## 2019-12-31 DIAGNOSIS — R079 Chest pain, unspecified: Secondary | ICD-10-CM

## 2019-12-31 DIAGNOSIS — Z8249 Family history of ischemic heart disease and other diseases of the circulatory system: Secondary | ICD-10-CM

## 2019-12-31 LAB — BASIC METABOLIC PANEL
Anion gap: 9 (ref 5–15)
BUN: 12 mg/dL (ref 8–23)
CO2: 23 mmol/L (ref 22–32)
Calcium: 9.1 mg/dL (ref 8.9–10.3)
Chloride: 110 mmol/L (ref 98–111)
Creatinine, Ser: 0.8 mg/dL (ref 0.44–1.00)
GFR calc Af Amer: >60 mL/min (ref >60)
GFR calc non Af Amer: >60 mL/min (ref >60)
Glucose, Bld: 98 mg/dL (ref 70–99)
Potassium: 4.1 mmol/L (ref 3.5–5.1)
Sodium: 142 mmol/L (ref 135–145)

## 2019-12-31 LAB — CBC
HCT: 37 % (ref 36.0–46.0)
Hemoglobin: 11.9 g/dL — ABNORMAL LOW (ref 12.0–15.0)
MCH: 26.9 pg (ref 26.0–34.0)
MCHC: 32.2 g/dL (ref 30.0–36.0)
MCV: 83.5 fL (ref 80.0–100.0)
Platelets: 226 10*3/uL (ref 150–400)
RBC: 4.43 MIL/uL (ref 3.87–5.11)
RDW: 13.4 % (ref 11.5–15.5)
WBC: 6.2 10*3/uL (ref 4.0–10.5)
nRBC: 0 % (ref 0.0–0.2)

## 2019-12-31 LAB — TROPONIN I (HIGH SENSITIVITY): Troponin I (High Sensitivity): 29 ng/L — ABNORMAL HIGH (ref <18)

## 2019-12-31 MED ORDER — ACETAMINOPHEN 500 MG PO TABS
1000.0000 mg | ORAL_TABLET | Freq: Once | ORAL | Status: AC
  Administered 2019-12-31: 1000 mg via ORAL
  Filled 2019-12-31: qty 2

## 2019-12-31 MED ORDER — NITROGLYCERIN 0.4 MG SL SUBL
0.4000 mg | SUBLINGUAL_TABLET | Freq: Once | SUBLINGUAL | Status: AC
  Administered 2019-12-31: 0.4 mg via SUBLINGUAL

## 2019-12-31 MED ORDER — APIXABAN 5 MG PO TABS
5.0000 mg | ORAL_TABLET | Freq: Once | ORAL | Status: AC
  Administered 2019-12-31: 5 mg via ORAL
  Filled 2019-12-31: qty 1

## 2019-12-31 NOTE — ED Provider Notes (Addendum)
[facility] Emergency Department Provider Note   ____________________________________________   First MD Initiated Contact with Patient 12/31/19 2156     (approximate)  I have reviewed the triage vital signs and the nursing notes.   HISTORY  Chief Complaint Shortness of Breath    HPI Leah Roberts is a 69 y.o. female who comes in and provides me the same history she gave to the [facility].  She had a history of blood clots DVT and PE twice in [place].  She was treated with warfarin.  She was switched to Eliquis in 2018 and takes 2.5 mg twice a day.  She reports she works in her garden for about an hour to an hour and a half.  About a week ago almost 2 weeks ago she was working in the garden and began having heart palpitations and weakness and shortness of breath.  She also had chest tightness.  She went in the house and after a while she told the [place] about 30 minutes the symptoms resolved.  She says since then every time she exerts herself or walks for a long distance she gets the chest tightness and shortness of breath again.  She told me she usually walks a couple blocks before it happens but apparently today it happened while walking across the parking lot.  She has not had any Eliquis today.  She is not having any chest pressure heaviness or tightness now.  She is having some achiness in her legs.  She gets this if she does not move around enough and she has been sitting all day long.  Patient's chest pain is tightness and not pleuritic.  She is not running a fever.  She is not hypoxic at rest.  She is not coughing.         Past Medical History:  Diagnosis Date  . DVT (deep venous thrombosis) (HCC)   . DVT (deep venous thrombosis) (HCC) 2013  . History of hypertension   . Hypertension   . Pulmonary embolism [facility]) 2013    Patient Active Problem List   Diagnosis Date Noted  . HTN (hypertension) 12/31/2019  . Chest pain 12/31/2019  .  DOE (dyspnea on exertion) 12/31/2019  . Acute coronary syndrome (HCC) 12/31/2019  . Atherosclerosis 08/23/2018  . Screening for tuberculosis 07/12/2018  . 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 Arthralgia 07/12/2018  . Solitary pulmonary nodule 07/12/2018  . Neck pain 02/15/2018  . Dizziness 02/15/2018  . Elevated TSH 02/15/2018  . Screening for HIV (human immunodeficiency virus) 10/05/2017  . Neuropathy 10/05/2017  . Screening for breast cancer 10/05/2017  . Hyperlipidemia 08/04/2016  . Prediabetes 08/04/2016  . Back pain 08/04/2016  . Left knee pain 06/18/2016  . History of DVT of lower extremity 04/01/2014  . Pulmonary embolism (HCC) 05/04/2011    Past Surgical History:  Procedure Laterality Date  . APPENDECTOMY    . TONSILLECTOMY AND ADENOIDECTOMY      Prior to Admission medications   Medication Sig Start Date End Date Taking? Authorizing Provider  apixaban (ELIQUIS) 2.5 MG TABS tablet Take 1 tablet (2.5 mg total) by mouth 2 (two) times daily. 08/23/18   Allegra Grana, FNP  loratadine (CLARITIN) 10 MG tablet Take 10 mg by mouth daily.    [provider]    Allergies Sulfa antibiotics and Sulfa antibiotics  Family History  Problem Relation Age of Onset  . Hypertension Father   . Heart disease Father   . Thyroid disease Sister   .  Allergies Sister     Social History Social History   Tobacco Use  . Smoking status: Never Smoker  . Smokeless tobacco: Never Used  Substance Use Topics  . Alcohol use: No  . Drug use: No    Review of Systems  Constitutional: No fever/chills Eyes: No visual changes. ENT: No sore throat. Cardiovascular: Currently denies chest pain. Respiratory: Currently denies shortness of breath. Gastrointestinal: No abdominal pain.  No nausea, no vomiting.  No diarrhea.  No constipation. Genitourinary: Negative for dysuria. Musculoskeletal: Negative for back pain. Skin: Negative for rash. Neurological: Negative for headaches, focal  weakness   ____________________________________________   PHYSICAL EXAM:  VITAL SIGNS: ED Triage Vitals  Enc Vitals Group     BP 12/31/19 1611 (!) 140/96     Pulse Rate 12/31/19 1239 71     Resp 12/31/19 1239 16     Temp 12/31/19 1239 98.7 F (37.1 C)     Temp Source 12/31/19 1239 Oral     SpO2 12/31/19 1239 97 %     Weight 12/31/19 1239 223 lb 12.3 oz (101.5 kg)     Height 12/31/19 1239 5\' 6"  (1.676 m)     Head Circumference --      Peak Flow --      Pain Score 12/31/19 1239 0     Pain Loc --      Pain Edu? --      Excl. in GC? --     Constitutional: Alert and oriented. Well appearing and in no acute distress. Eyes: Conjunctivae are normal. PERRL. EOMI. Head: Atraumatic. Nose: No congestion/rhinnorhea. Mouth/Throat: Mucous membranes are moist.  Oropharynx non-erythematous. Neck: No stridor.  Cardiovascular: Normal rate, regular rhythm. Grossly normal heart sounds.  Good peripheral circulation. Respiratory: Normal respiratory effort.  No retractions. Lungs CTAB. Gastrointestinal: Soft and nontender. No distention. No abdominal bruits. No CVA tenderness. Musculoskeletal: No lower extremity tenderness nor edema.   Neurologic:  Normal speech and language. No gross focal neurologic deficits are appreciated.  Skin:  Skin is warm, dry and intact. No rash noted.   ____________________________________________   LABS (all labs ordered are listed, but only abnormal results are displayed)  Labs Reviewed  CBC - Abnormal; Notable for the following components:      Result Value   Hemoglobin 11.9 (*)    All other components within normal limits  TROPONIN I (HIGH SENSITIVITY) - Abnormal; Notable for the following components:   Troponin I (High Sensitivity) 29 (*)    All other components within normal limits  SARS CORONAVIRUS 2 BY RT PCR (HOSPITAL ORDER, PERFORMED IN [facility])  BASIC METABOLIC PANEL   ____________________________________________  EKG  EKG  read interpreted by me shows normal sinus rhythm rate of 71 normal axis no acute ST-T wave changes ____________________________________________  RADIOLOGY  ED MD interpretation: X-ray read by radiology reviewed by me is negative  Official radiology report(s): DG Chest 2 View  Result Date: 12/31/2019 CLINICAL DATA:  Acute shortness of breath. EXAM: CHEST - 2 VIEW COMPARISON:  06/26/2018 radiograph and 08/16/2018 CT FINDINGS: The cardiomediastinal silhouette is unremarkable. There is no evidence of focal airspace disease, pulmonary edema, suspicious pulmonary nodule/mass, pleural effusion, or pneumothorax. No acute bony abnormalities are identified. IMPRESSION: No active cardiopulmonary disease. Electronically Signed   By: 08/18/2018 M.D.   On: 12/31/2019 13:25    ____________________________________________   PROCEDURES  Procedure(s) performed (including Critical Care):  Procedures   ____________________________________________   INITIAL IMPRESSION / ASSESSMENT AND PLAN /  ED COURSE  Patient again confirms that today she just walked across the parking lot and became short of breath with chest pain.  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  I do not believe she should go home especially since her troponin is somewhat elevated at 29.  It does not sound like there is any PE.  She has been on Eliquis although today she missed her medicines.  She is on a dose is slightly low.  She has been on that since apparently 2018 without difficulty.  She is not having any pain at rest the pain she does have when she exerts herself is not pleuritic she is not short of breath at rest and she does not develop any symptoms until she has been exerting herself for a period of time.  Again these resolve after resting for period of time as clinically again sounds like it is angina.  She is not running a fever and has no cough no other symptoms to suggest pneumonia or Covid.              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    Arnaldo Natal, MD 12/31/19 2226    Arnaldo Natal, MD 12/31/19 2227

## 2019-12-31 NOTE — ED Triage Notes (Signed)
Short of breath, especially with exertion for about a week.  Denies chest pian.  Says just short of breath.

## 2019-12-31 NOTE — Assessment & Plan Note (Deleted)
She developed mid chest tightness, more shortness of breath lying supine.  EKG obtained. She was placed in semi-fowler's,  O2 2 L applied, pulse ox 97%, blood pressure 120/82, heart rate 83, administered nitroglycerin x1, and chest tightness decreased to 5 out of 10, heart rate 56, blood pressure 134/84, pulse ox.  Sat 94% skin is warm clammy, she is answering questions appropriately, alert and oriented x3.  EMS arrives  transports patient to the hospital.

## 2019-12-31 NOTE — Assessment & Plan Note (Signed)
She developed mid chest tightness, more shortness of breath lying supine.  EKG obtained and is positive for lateral ischemia, possible infarct. She was placed in semi-fowler's,  O2 2 L applied, pulse ox 97%, blood pressure 120/82, heart rate 83, administered nitroglycerin x1, and chest tightness decreased to 5 out of 10, heart rate 56, blood pressure 134/84, pulse ox.  Sat 94% skin is warm clammy, she is answering questions appropriately, alert and oriented x3.  EMS arrives  transports patient to the hospital.

## 2019-12-31 NOTE — H&P (Signed)
[facility] History and Physical  Leah Roberts OMV:[id number] DOB: 1950-12-04 DOA: 12/31/2019  Referring physician: Dr. Darnelle Catalan PCP: Allegra Grana, FNP   Chief Complaint: chest tightness  HPI: Leah Roberts is a 69 y.o. 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  Patient seen earlier today in PCPs office.  At that visit she reported an episode of chest tightness and dyspnea after exerting herself physically in the garden.  Also reported that since that time she had had similar symptoms whenever she exerted herself physically.  She subsequently had an episode of chest pain in the office, an EKG was obtained which showed T wave inversions (no priors for comparison), she was given nitro x1 with improvement in chest pain and then EMS was called to transport her to the hospital.  On my interview reports that first episode occurred while working in her garden last week.  She reports a chest tightness, not quite like pain, that is central and sometimes radiates to her right shoulder.  She denies any associated nausea or sweating, but she does endorse a sensation of shortness of breath.  Since this occurred the week prior it has happened more more frequently and with decreasing amounts of exertion.  Previously it occurred with walking 2-3 blocks, currently it occurred earlier today while walking across the parking lot to go to her doctor's appointment at her PCPs office.  She also reports that just prior to my arrival in the room she had an episode of chest tightness that lasted approximately 10 to 15 minutes while she was at rest.  She reports that these episodes in the past have resolved with rest.  She is unsure why she is on 2.5 mg of Eliquis as opposed to 5 mg, she reports she was previously on warfarin but was switched several years ago.  She reports that sometimes she takes her doses  late but denies ever missing any doses.  She endorses chronic swelling in her left leg has been present for many years but has not changed at all recently.  In the ED initial vital signs notable for normal pulse, normal oxygen saturation on room air, remainder vital signs unremarkable.  Lab work-up showed unremarkable BMP and CBC only with very mild anemia.  She underwent a chest x-ray which was unremarkable, and subsequently a CTPA which did not show evidence of PE but did demonstrate dilated pulmonary arteries consistent with pulmonary hypertension.  EKG was obtained which showed T wave inversions in leads III, aVF, V2, and V3, no prior for comparison.  Initial high-sensitivity troponin was elevated at 29.  She was admitted for further management of her chest pain.   Review of Systems:  Pertinent positives and negative per HPI, all others reviewed and negative   Past Medical History:  Diagnosis Date  . DVT (deep venous thrombosis) (HCC)   . DVT (deep venous thrombosis) (HCC) 2013  . History of hypertension   . Hypertension   . Pulmonary embolism (HCC) 2013   Past Surgical History:  Procedure Laterality Date  . APPENDECTOMY    . TONSILLECTOMY AND ADENOIDECTOMY     Social History:  reports that she has never smoked. She has never used smokeless tobacco. She reports that she does not drink alcohol and does not use drugs.  Allergies  Allergen Reactions  . Sulfa Antibiotics Swelling and Rash  . Sulfa Antibiotics Rash    Family History  Problem Relation Age of  Onset  . Hypertension Father   . Heart disease Father   . Thyroid disease Sister   . Allergies Sister     Prior to Admission medications   Medication Sig Start Date End Date Taking? Authorizing Provider  apixaban (ELIQUIS) 2.5 MG TABS tablet Take 1 tablet (2.5 mg total) by mouth 2 (two) times daily. 08/23/18  Yes Arnett, Lyn Records, FNP  loratadine (CLARITIN) 10 MG tablet Take 10 mg by mouth daily.   Yes [provider]   Physical Exam: Vitals:   12/31/19 1239 12/31/19 1611 12/31/19 2300  BP:  (!) 140/96 137/76  Pulse: 71 74 82  Resp: 16 20 20   Temp: 98.7 F (37.1 C)    TempSrc: Oral    SpO2: 97% 98% 96%  Weight: 101.5 kg    Height: 5\' 6"  (1.676 m)      Wt Readings from Last 3 Encounters:  12/31/19 101.5 kg  12/31/19 101.2 kg  07/12/18 100.9 kg    General:  Appears calm and comfortable Eyes: PERRL, normal lids, irises & conjunctiva ENT: grossly normal hearing, lips & tongue Neck: no LAD, masses or thyromegaly Cardiovascular: RRR, no m/r/g. Bilateral non-pitting edema of LE, LLE appears mildly larger than RLE. Telemetry: SR, no arrhythmias  Respiratory: CTA bilaterally, no w/r/r. Normal respiratory effort. Abdomen: soft, ntnd Skin: no rash or induration seen on limited exam Musculoskeletal: grossly normal tone BUE/BLE.  Psychiatric: grossly normal mood and affect, speech fluent and appropriate Neurologic: grossly non-focal.          Labs on Admission:  Basic Metabolic Panel: Recent Labs  Lab 12/31/19 1243  NA 142  K 4.1  CL 110  CO2 23  GLUCOSE 98  BUN 12  CREATININE 0.80  CALCIUM 9.1   Liver Function Tests: No results for input(s): AST, ALT, ALKPHOS, BILITOT, PROT, ALBUMIN in the last 168 hours. No results for input(s): LIPASE, AMYLASE in the last 168 hours. No results for input(s): AMMONIA in the last 168 hours. CBC: Recent Labs  Lab 12/31/19 1243  WBC 6.2  HGB 11.9*  HCT 37.0  MCV 83.5  PLT 226   Cardiac Enzymes: No results for input(s): CKTOTAL, CKMB, CKMBINDEX, TROPONINI in the last 168 hours.  BNP (last 3 results) No results for input(s): BNP in the last 8760 hours.  ProBNP (last 3 results) No results for input(s): PROBNP in the last 8760 hours.  CBG: No results for input(s): GLUCAP in the last 168 hours.  Radiological Exams on Admission: DG Chest 2 View  Result Date: 12/31/2019 CLINICAL DATA:  Acute shortness of breath. EXAM: CHEST  - 2 VIEW COMPARISON:  06/26/2018 radiograph and 08/16/2018 CT FINDINGS: The cardiomediastinal silhouette is unremarkable. There is no evidence of focal airspace disease, pulmonary edema, suspicious pulmonary nodule/mass, pleural effusion, or pneumothorax. No acute bony abnormalities are identified. IMPRESSION: No active cardiopulmonary disease. Electronically Signed   By: 06/28/2018 M.D.   On: 12/31/2019 13:25   CT ANGIO CHEST PE W OR WO CONTRAST  Result Date: 01/01/2020 CLINICAL DATA:  History of deep venous thrombosis and pulmonary embolus, acute shortness of breath EXAM: CT ANGIOGRAPHY CHEST WITH CONTRAST TECHNIQUE: Multidetector CT imaging of the chest was performed using the standard protocol during bolus administration of intravenous contrast. Multiplanar CT image reconstructions and MIPs were obtained to evaluate the vascular anatomy. CONTRAST:  46mL OMNIPAQUE IOHEXOL 350 MG/ML SOLN COMPARISON:  12/31/2019, 08/16/2018 FINDINGS: Cardiovascular: This is a technically adequate evaluation of the pulmonary vasculature. There are no filling  defects or pulmonary emboli. The main pulmonary arteries are dilated, consistent with pulmonary arterial hypertension. The heart is unremarkable without pericardial effusion. Normal caliber of the thoracic aorta. Minimal atherosclerosis. Mediastinum/Nodes: No enlarged mediastinal, hilar, or axillary lymph nodes. Thyroid gland, trachea, and esophagus demonstrate no significant findings. Lungs/Pleura: No acute airspace disease, effusion, or pneumothorax. Central airways are patent. Stable 4 mm right middle lobe nodule image 174 and 3 mm right lower lobe nodule image 142 of series 6. No other pulmonary nodules. Upper Abdomen: No acute abnormality. Musculoskeletal: No acute or destructive bony lesions. Reconstructed images demonstrate no additional findings. Review of the MIP images confirms the above findings. IMPRESSION: 1. No evidence of pulmonary embolus. 2. Dilated  pulmonary arteries consistent with pulmonary arterial hypertension. 3. Stable sub 4 mm nodules within the right middle and right lower lobes as above. Given long-term stability, no further imaging workup is required. This recommendation follows the consensus statement: Guidelines for Management of Incidental Pulmonary Nodules Detected on CT Images: From the Fleischner Society 2017; Radiology 2017; 284:228-243. 4.  Aortic Atherosclerosis (ICD10-I70.0). Electronically Signed   By: Sharlet Salina M.D.   On: 01/01/2020 00:32    EKG: Independently reviewed.  Sinus rhythm,T wave inversions in leads III, aVF, V2, and V3, no ST segment deviations, no prior for comparison  Assessment/Plan Active Problems:   Pulmonary embolism (HCC)   Hyperlipidemia   Prediabetes   Positive PPD   Unstable angina (HCC)   #Unstable angina Symptoms are concerning for unstable angina given increasing frequency and even events occurring at rest.  PE was also consideration but has been ruled out with CTPA.  Already on anticoagulation and ordered for aspirin.  EKG shows T wave inversions in anterior and inferior leads, however no priors for comparison to know if these are new. -Cardiology consultation in the a.m. for consideration of angiogram -Trend troponin -Telemetry -Echocardiogram -N.p.o. at midnight -Continue Eliquis  #Chronic medical problems Hx PE x2: cont Eliquis  Code Status: Full Code, presumed DVT Prophylaxis: On Eliquis Family Communication: None Disposition Plan: Inpatient  Time spent: 50 min  Venora Maples MD/MPH [facility]

## 2019-12-31 NOTE — Progress Notes (Signed)
Established Patient Office Visit  Subjective:  Patient ID: Leah Roberts, female    DOB: 19-Jan-1951  Age: 69 y.o. MRN: [id number]  CC:  Chief Complaint  Patient presents with  . 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 She established care at [facility] in 2015 and  was switched to Eliquis in 2018.   She presents today for a 1 week history of exertional shortness of breath, palpitations and chest tightness.   She typically works in her garden for about a hour to an hour and a half daily.  Last week after working the garden she had heart palpitations, weakness, shortness of breath, and chest tightness.  She went in the house and sat down.  After about 30 minutes the symptoms resolved and she felt fine.  She has noticed since then, that every time she exerts herself , she feels those symptoms. She had DOE walking across the parking today. She is wondering if her Eliquis 2.5 mg BID dose was too low and she has developed another blood clot.  She has not interrupted therapy on Eliquis.  She has no  history of myocardial infarction, or CAD.  She has no chest pain now, pressure heaviness or tightness.  No shortness of breath at rest.  Pulse ox 98% heart rate 83 and regular.  She does not follow with Cardiologist.  She has chronic intermittent left knee pain, varicose veins, and some bilateral pedal swelling.  Her left leg always bothers her behind the knee and it feels stiff. This is a chronic, intermittent problem.  No calf pain or swelling.     Past Medical History:  Diagnosis Date  . DVT (deep venous thrombosis) (HCC)   . DVT (deep venous thrombosis) (HCC) 2013  . History of hypertension   . Hypertension   . Pulmonary embolism (HCC) 2013    Past Surgical History:  Procedure Laterality Date  . APPENDECTOMY    . TONSILLECTOMY AND  ADENOIDECTOMY      Family History  Problem Relation Age of Onset  . Hypertension Father   . Heart disease Father   . Thyroid disease Sister   . Allergies Sister     Social History   Socioeconomic History  . Marital status: Widowed    Spouse name: Not on file  . Number of children: Not on file  . Years of education: Not on file  . Highest education level: Not on file  Occupational History  . Not on file  Tobacco Use  . Smoking status: Never Smoker  . Smokeless tobacco: Never Used  Substance and Sexual Activity  . Alcohol use: No  . Drug use: No  . Sexual activity: Not on file  Other Topics Concern  . Not on file  Social History Narrative   ** Merged History Encounter **       Social Determinants of Health   Financial Resource Strain:   . Difficulty of Paying Living Expenses: Not on file  Food Insecurity:   . Worried About Programme researcher, broadcasting/film/video in the Last Year: Not on file  . Ran Out of Food in the Last Year: Not on file  Transportation Needs:   . Lack of Transportation (Medical): Not on file  . Lack of Transportation (Non-Medical): Not on file  Physical Activity:   . Days  of Exercise per Week: Not on file  . Minutes of Exercise per Session: Not on file  Stress:   . Feeling of Stress : Not on file  Social Connections:   . Frequency of Communication with Friends and Family: Not on file  . Frequency of Social Gatherings with Friends and Family: Not on file  . Attends Religious Services: Not on file  . Active Member of Clubs or Organizations: Not on file  . Attends Banker Meetings: Not on file  . Marital Status: Not on file  Intimate Partner Violence:   . Fear of Current or Ex-Partner: Not on file  . Emotionally Abused: Not on file  . Physically Abused: Not on file  . Sexually Abused: Not on file    Outpatient Medications Prior to Visit  Medication Sig Dispense Refill  . apixaban (ELIQUIS) 2.5 MG TABS tablet Take 1 tablet (2.5 mg total) by  mouth 2 (two) times daily. 180 tablet 0  . loratadine (CLARITIN) 10 MG tablet Take 10 mg by mouth daily.    Marland Kitchen acetaminophen (TYLENOL) 500 MG tablet Take 500 mg by mouth every 6 (six) hours as needed.    . diclofenac sodium (VOLTAREN) 1 % GEL Apply 4 g topically 4 (four) times daily. 1 Tube 3  . gabapentin (NEURONTIN) 100 MG capsule Take 1 capsule (100 mg total) by mouth 3 (three) times daily. 90 capsule 3  . lidocaine (LIDODERM) 5 % Place 1 patch onto the skin daily. Remove & Discard patch within 12 hours. 30 patch 1  . rosuvastatin (CRESTOR) 10 MG tablet Take 1 tablet (10 mg total) by mouth daily. 90 tablet 3   No facility-administered medications prior to visit.    Allergies  Allergen Reactions  . Sulfa Antibiotics Swelling and Rash  . Sulfa Antibiotics Rash    Review of Systems  Constitutional: Negative for chills and fever.  HENT: Negative.   Eyes: Negative.   Respiratory: Positive for chest tightness and shortness of breath. Negative for cough.   Cardiovascular: Positive for chest pain, palpitations and leg swelling.  Gastrointestinal: Positive for abdominal pain.       Slight non-specific mid stomach hurts sometimes  Endocrine: Negative.   Genitourinary: Negative for difficulty urinating.  Musculoskeletal:       Legs bother her- left knee area, varacose veins , lower leg edema  Skin: Negative for rash.  Neurological: Negative for dizziness and headaches.      Objective:    Physical Exam Vitals reviewed.  Constitutional:      Appearance: Normal appearance. She is obese.  HENT:     Head: Normocephalic and atraumatic.  Cardiovascular:     Rate and Rhythm: Normal rate and regular rhythm.     Pulses: Normal pulses.     Heart sounds: Normal heart sounds.  Pulmonary:     Breath sounds: Normal breath sounds.     Comments: Positive breathiness with speaking Pulse ox RA 89% Abdominal:     Palpations: Abdomen is soft.     Tenderness: There is abdominal tenderness. There  is no guarding or rebound.     Comments: A little sore umbilical area  Musculoskeletal:        General: Normal range of motion.  Skin:    Comments: Skin w/d- became clammy after NTG   Neurological:     General: No focal deficit present.     Mental Status: She is alert and oriented to person, place, and time.  Psychiatric:  Mood and Affect: Mood normal.        Behavior: Behavior normal.     BP 120/82 (BP Location: Left Arm, Patient Position: Sitting, Cuff Size: Normal)   Pulse 83   Temp 98.2 F (36.8 C) (Oral)   Ht 5\' 6"  (1.676 m)   Wt 223 lb (101.2 kg)   SpO2 98%   BMI 35.99 kg/m  Wt Readings from Last 3 Encounters:  12/31/19 223 lb 12.3 oz (101.5 kg)  12/31/19 223 lb (101.2 kg)  07/12/18 222 lb 6.4 oz (100.9 kg)   Pulse Readings from Last 3 Encounters:  12/31/19 71  12/31/19 83  07/12/18 (!) 103    BP Readings from Last 3 Encounters:  12/31/19 120/82  07/12/18 116/70  10/05/17 124/74    Lab Results  Component Value Date   CHOL 227 (H) 10/05/2017   HDL 47.10 10/05/2017   LDLCALC 155 (H) 10/05/2017   TRIG 124.0 10/05/2017   CHOLHDL 5 10/05/2017      Health Maintenance Due  Topic Date Due  . COLONOSCOPY  Never done  . DEXA SCAN  Never done  . PNA vac Low Risk Adult (1 of 2 - PCV13) Never done  . MAMMOGRAM  12/23/2019    There are no preventive care reminders to display for this patient.  Lab Results  Component Value Date   TSH 3.42 02/15/2018   Lab Results  Component Value Date   WBC 6.2 12/31/2019   HGB 11.9 (L) 12/31/2019   HCT 37.0 12/31/2019   MCV 83.5 12/31/2019   PLT 226 12/31/2019   Lab Results  Component Value Date   NA 142 12/31/2019   K 4.1 12/31/2019   CO2 23 12/31/2019   GLUCOSE 98 12/31/2019   BUN 12 12/31/2019   CREATININE 0.80 12/31/2019   BILITOT 0.4 06/18/2016   ALKPHOS 51 06/18/2016   AST 19 06/18/2016   ALT 13 06/18/2016   PROT 7.1 06/18/2016   ALBUMIN 3.9 06/18/2016   CALCIUM 9.1 12/31/2019   ANIONGAP 9  12/31/2019   GFR 74.15 10/05/2017   Lab Results  Component Value Date   CHOL 227 (H) 10/05/2017   Lab Results  Component Value Date   HDL 47.10 10/05/2017   Lab Results  Component Value Date   LDLCALC 155 (H) 10/05/2017   Lab Results  Component Value Date   TRIG 124.0 10/05/2017   Lab Results  Component Value Date   CHOLHDL 5 10/05/2017   Lab Results  Component Value Date   HGBA1C 6.2 02/15/2018      Assessment & Plan:   Problem List Items Addressed This Visit      Cardiovascular and Mediastinum   Acute coronary syndrome (HCC)    She developed mid chest tightness, more shortness of breath lying supine.  EKG obtained and is positive for lateral ischemia, possible infarct. She was placed in semi-fowler's,  O2 2 L applied, pulse ox 97%, blood pressure 120/82, heart rate 83, administered nitroglycerin x1, and chest tightness decreased to 5 out of 10, heart rate 56, blood pressure 134/84, pulse ox.  Sat 94% skin is warm clammy, she is answering questions appropriately, alert and oriented x3.  EMS arrives  transports patient to the hospital.           Other   Chest pain   DOE (dyspnea on exertion) - Primary   Relevant Orders   EKG 12-Lead      Meds ordered this encounter  Medications  . nitroGLYCERIN (NITROSTAT)  SL tablet 0.4 mg    One week history of exertional shortness of breath, palpitations, chest tightness.  History of DVT and PE 2013 presents with Eliquis.  Patient reports symptoms could be reminiscent of past PE. EKG performed for above symptoms and shows NSR rate 76, no ectopics, and positive T wave inversion in lateral leads V2 and V3 and in lead III.  This is suggestive of lateral ischemia/infarct.No ectopics. Patient was transported to the emergency department with 5/10 CP, DOE via EMS in stable condition.   Follow-up: No follow-ups on file.  This visit occurred during the SARS-CoV-2 public health emergency.  Safety protocols were in place, including  screening questions prior to the visit, additional usage of staff PPE, and extensive cleaning of exam room while observing appropriate contact time as indicated for disinfecting solutions.    Amedeo Kinsman, NP

## 2019-12-31 NOTE — Progress Notes (Signed)
Nurse was advised patient with chest pain , accessed patient pain reported chect pain to the center of chest rated at 7 on scale 0- 10 attained initial BP was 158/90 pulse 72 resp 22 with Complaint of SOB, 02 started at 2L [place] and 0.4 mg  Nitro given SL. Waited  5 minutes patient reported decrease in chest pain to 5 on 0-10 scale. With slight headache, attained BP  134/84 pulse 56 02 sats were 92% on 2 L oxygen. EMS arrived and patient was transferred to EMS for transport, copy offace sheet with vitals and meds sent.

## 2019-12-31 NOTE — ED Notes (Signed)
Admitting at the bedside.  

## 2020-01-01 ENCOUNTER — Inpatient Hospital Stay: Payer: Self-pay

## 2020-01-01 ENCOUNTER — Encounter: Payer: Self-pay | Admitting: Family Medicine

## 2020-01-01 ENCOUNTER — Inpatient Hospital Stay: Admit: 2020-01-01 | Payer: Self-pay

## 2020-01-01 ENCOUNTER — Inpatient Hospital Stay
Admit: 2020-01-01 | Discharge: 2020-01-01 | Disposition: A | Discharge status: Home / Self Care | Payer: Self-pay | Attending: Family Medicine | Admitting: Family Medicine

## 2020-01-01 LAB — ECHOCARDIOGRAM COMPLETE
AR max vel: 2.24 cm2
AV Area VTI: 2.53 cm2
AV Area mean vel: 2.27 cm2
AV Mean grad: 4 mmHg
AV Peak grad: 7.2 mmHg
Ao pk vel: 1.35 m/s
Area-P 1/2: 3.36 cm2
Height: 66 in
S' Lateral: 2.6 cm
Weight: 3580.27 oz

## 2020-01-01 LAB — BASIC METABOLIC PANEL
Anion gap: 6 (ref 5–15)
BUN: 14 mg/dL (ref 8–23)
CO2: 20 mmol/L — ABNORMAL LOW (ref 22–32)
Calcium: 7.8 mg/dL — ABNORMAL LOW (ref 8.9–10.3)
Chloride: 112 mmol/L — ABNORMAL HIGH (ref 98–111)
Creatinine, Ser: 0.88 mg/dL (ref 0.44–1.00)
GFR calc Af Amer: >60 mL/min (ref >60)
GFR calc non Af Amer: >60 mL/min (ref >60)
Glucose, Bld: 109 mg/dL — ABNORMAL HIGH (ref 70–99)
Potassium: 3.8 mmol/L (ref 3.5–5.1)
Sodium: 138 mmol/L (ref 135–145)

## 2020-01-01 LAB — CBC
HCT: 32.9 % — ABNORMAL LOW (ref 36.0–46.0)
Hemoglobin: 10.7 g/dL — ABNORMAL LOW (ref 12.0–15.0)
MCH: 26.4 pg (ref 26.0–34.0)
MCHC: 32.5 g/dL (ref 30.0–36.0)
MCV: 81.2 fL (ref 80.0–100.0)
Platelets: 222 10*3/uL (ref 150–400)
RBC: 4.05 MIL/uL (ref 3.87–5.11)
RDW: 13.6 % (ref 11.5–15.5)
WBC: 7.6 10*3/uL (ref 4.0–10.5)
nRBC: 0 % (ref 0.0–0.2)

## 2020-01-01 LAB — LIPID PANEL
Cholesterol: 133 mg/dL (ref 0–200)
HDL: 29 mg/dL — ABNORMAL LOW (ref >40)
LDL Cholesterol: 76 mg/dL (ref 0–99)
Total CHOL/HDL Ratio: 4.6 RATIO
Triglycerides: 142 mg/dL (ref <150)
VLDL: 28 mg/dL (ref 0–40)

## 2020-01-01 LAB — TROPONIN I (HIGH SENSITIVITY): Troponin I (High Sensitivity): 19 ng/L — ABNORMAL HIGH (ref <18)

## 2020-01-01 LAB — SARS CORONAVIRUS 2 BY RT PCR (HOSPITAL ORDER, PERFORMED IN ~~LOC~~ HOSPITAL LAB): SARS Coronavirus 2: NEGATIVE

## 2020-01-01 MED ORDER — APIXABAN 5 MG PO TABS
5.0000 mg | ORAL_TABLET | Freq: Two times a day (BID) | ORAL | Status: DC
  Administered 2020-01-01 – 2020-01-02 (×2): 5 mg via ORAL
  Filled 2020-01-01 (×2): qty 1

## 2020-01-01 MED ORDER — APIXABAN 2.5 MG PO TABS
2.5000 mg | ORAL_TABLET | Freq: Once | ORAL | Status: AC
  Administered 2020-01-01: 2.5 mg via ORAL
  Filled 2020-01-01: qty 1

## 2020-01-01 MED ORDER — ONDANSETRON HCL 4 MG/2ML IJ SOLN: 4.0000 mg | Freq: Four times a day (QID) | INTRAMUSCULAR | Status: DC | PRN

## 2020-01-01 MED ORDER — ASPIRIN EC 81 MG PO TBEC
81.0000 mg | DELAYED_RELEASE_TABLET | Freq: Every day | ORAL | Status: DC
  Administered 2020-01-01 – 2020-01-02 (×2): 81 mg via ORAL
  Filled 2020-01-01 (×2): qty 1

## 2020-01-01 MED ORDER — ASPIRIN 81 MG PO CHEW
324.0000 mg | CHEWABLE_TABLET | Freq: Once | ORAL | Status: AC
  Administered 2020-01-01: 324 mg via ORAL
  Filled 2020-01-01: qty 4

## 2020-01-01 MED ORDER — ASPIRIN 81 MG PO CHEW: 324.0000 mg | CHEWABLE_TABLET | ORAL | Status: DC

## 2020-01-01 MED ORDER — APIXABAN 2.5 MG PO TABS
2.5000 mg | ORAL_TABLET | Freq: Two times a day (BID) | ORAL | Status: DC
  Administered 2020-01-01: 2.5 mg via ORAL
  Filled 2020-01-01 (×2): qty 1

## 2020-01-01 MED ORDER — ASPIRIN 300 MG RE SUPP: 300.0000 mg | RECTAL | Status: DC

## 2020-01-01 MED ORDER — IOHEXOL 350 MG/ML SOLN
75.0000 mL | Freq: Once | INTRAVENOUS | Status: AC | PRN
  Administered 2020-01-01: 75 mL via INTRAVENOUS

## 2020-01-01 MED ORDER — TRAZODONE HCL 50 MG PO TABS: 50.0000 mg | ORAL_TABLET | Freq: Every evening | ORAL | Status: DC | PRN

## 2020-01-01 MED ORDER — NITROGLYCERIN 0.4 MG SL SUBL: 0.4000 mg | SUBLINGUAL_TABLET | SUBLINGUAL | Status: DC | PRN

## 2020-01-01 MED ORDER — APIXABAN 5 MG PO TABS
5.0000 mg | ORAL_TABLET | Freq: Once | ORAL | Status: DC
  Filled 2020-01-01: qty 1

## 2020-01-01 MED ORDER — ACETAMINOPHEN 325 MG PO TABS
650.0000 mg | ORAL_TABLET | ORAL | Status: DC | PRN
  Administered 2020-01-01 – 2020-01-02 (×3): 650 mg via ORAL
  Filled 2020-01-01 (×3): qty 2

## 2020-01-01 NOTE — ED Notes (Signed)
Pt to ct 

## 2020-01-01 NOTE — ED Notes (Signed)
Patient tells this RN that right shoulder was hurting. This RN and Mac RN decided to obtain EKG early in order to ensure shoulder pain was not cardiac related. Patient states that shoulder pain is chronic. Pt given tylenol for pain, EKG completed.

## 2020-01-01 NOTE — ED Notes (Addendum)
Pt assisted to bathroom at this time. When ambulating to restroom pt became SOB, with some labored breathing. When sitting in bed oxygen saturation around 92%. After sitting a few mins respirations became less labored

## 2020-01-01 NOTE — ED Notes (Signed)
Pt given hot tea with sugar bags and menu to order breakfast

## 2020-01-01 NOTE — Progress Notes (Signed)
*  PRELIMINARY RESULTS* Echocardiogram 2D Echocardiogram has been performed.  Cristela Blue 01/01/2020, 1:18 PM

## 2020-01-01 NOTE — ED Notes (Signed)
Pt ambulated to bathroom with no problems. ?

## 2020-01-01 NOTE — Consult Note (Addendum)
CARDIOLOGY CONSULT NOTE               Patient ID: Leah Roberts MRN: [id number] DOB/AGE: 67-Oct-1952 67 y.o.  Admit date: 12/31/2019 Referring Physician Dr. Esaw Grandchild  Primary Physician Rennie Plowman  Primary Cardiologist n/a Reason for Consultation Chest pain   HPI: Leah Roberts is a 69 year old female with a past medical history significant for DVT/PE, anticoagulated with Eliquis 2.5mg  BID and diet controlled hypertension who presented to the ED on 12/31/19 for a 2 week history of intermittent chest tightness with associated shortness of breath, palpitations, and weakness.  Workup in the ED has been significant for high sensitivity troponin elevated x 2, 29 and 19 respectively, COVID-19 negative, chest xray negative for acute cardiopulmonary disease, chest CT negative for a PE, and ECG revealing normal sinus rhythm with non-specific anterior T wave abnormalities.   01/01/20: She currently denies any chest pain, palpitations, or shortness of breath at rest, but admits to severe exertional dyspnea with minimal activity.  She has chronic bilateral lower extremity swelling from known varicose veins, but denies orthopnea or PND.  She also denies syncopal/presyncopal episodes.  She is currently eating and took Eliquis this morning.   Review of systems complete and found to be negative unless listed above     Past Medical History:  Diagnosis Date  . DVT (deep venous thrombosis) (HCC)   . DVT (deep venous thrombosis) (HCC) 2013  . History of hypertension   . Hypertension   . Pulmonary embolism (HCC) 2013    Past Surgical History:  Procedure Laterality Date  . APPENDECTOMY    . TONSILLECTOMY AND ADENOIDECTOMY      (Not in a hospital admission)  Social History   Socioeconomic History  . Marital status: Widowed    Spouse name: Not on file  . Number of children: Not on file  . Years of education: Not on file  . Highest education level: Not on file  Occupational  History  . Not on file  Tobacco Use  . Smoking status: Never Smoker  . Smokeless tobacco: Never Used  Substance and Sexual Activity  . Alcohol use: No  . Drug use: No  . Sexual activity: Not on file  Other Topics Concern  . Not on file  Social History Narrative   ** Merged History Encounter **       Social Determinants of Health   Financial Resource Strain:   . Difficulty of Paying Living Expenses: Not on file  Food Insecurity:   . Worried About Programme researcher, broadcasting/film/video in the Last Year: Not on file  . Ran Out of Food in the Last Year: Not on file  Transportation Needs:   . Lack of Transportation (Medical): Not on file  . Lack of Transportation (Non-Medical): Not on file  Physical Activity:   . Days of Exercise per Week: Not on file  . Minutes of Exercise per Session: Not on file  Stress:   . Feeling of Stress : Not on file  Social Connections:   . Frequency of Communication with Friends and Family: Not on file  . Frequency of Social Gatherings with Friends and Family: Not on file  . Attends Religious Services: Not on file  . Active Member of Clubs or Organizations: Not on file  . Attends Banker Meetings: Not on file  . Marital Status: Not on file  Intimate Partner Violence:   . Fear of Current or Ex-Partner: Not on file  .  Emotionally Abused: Not on file  . Physically Abused: Not on file  . Sexually Abused: Not on file    Family History  Problem Relation Age of Onset  . Hypertension Father   . Heart disease Father   . Thyroid disease Sister   . Allergies Sister       Review of systems complete and found to be negative unless listed above      PHYSICAL EXAM  General: Well developed, well nourished, sitting up, eating in bed, in no acute distress  HEENT:  Normocephalic and atramatic Neck:  No JVD.  Lungs: Clear bilaterally to auscultation and percussion. Heart: HRRR . Normal S1 and S2 without gallops or murmurs.  Abdomen: Bowel sounds are  positive, abdomen soft and non-tender  Msk:  Back normal.  Normal strength and tone for age. Extremities: No clubbing or cyanosis. Mild, non-pitting edema noted in bilateral lower extremities  Neuro: Alert and oriented X 3. Psych:  Good affect, responds appropriately  Labs:   Lab Results  Component Value Date   WBC 7.6 01/01/2020   HGB 10.7 (L) 01/01/2020   HCT 32.9 (L) 01/01/2020   MCV 81.2 01/01/2020   PLT 222 01/01/2020    Recent Labs  Lab 01/01/20 0236  NA 138  K 3.8  CL 112*  CO2 20*  BUN 14  CREATININE 0.88  CALCIUM 7.8*  GLUCOSE 109*   No results found for: CKTOTAL, CKMB, CKMBINDEX, TROPONINI  Lab Results  Component Value Date   CHOL 133 01/01/2020   CHOL 227 (H) 10/05/2017   CHOL 208 (H) 08/04/2016   Lab Results  Component Value Date   HDL 29 (L) 01/01/2020   HDL 47.10 10/05/2017   HDL 40.90 08/04/2016   Lab Results  Component Value Date   LDLCALC 76 01/01/2020   LDLCALC 155 (H) 10/05/2017   LDLCALC 138 (H) 08/04/2016   Lab Results  Component Value Date   TRIG 142 01/01/2020   TRIG 124.0 10/05/2017   TRIG 148.0 08/04/2016   Lab Results  Component Value Date   CHOLHDL 4.6 01/01/2020   CHOLHDL 5 10/05/2017   CHOLHDL 5 08/04/2016   No results found for: LDLDIRECT    Radiology: DG Chest 2 View  Result Date: 12/31/2019 CLINICAL DATA:  Acute shortness of breath. EXAM: CHEST - 2 VIEW COMPARISON:  06/26/2018 radiograph and 08/16/2018 CT FINDINGS: The cardiomediastinal silhouette is unremarkable. There is no evidence of focal airspace disease, pulmonary edema, suspicious pulmonary nodule/mass, pleural effusion, or pneumothorax. No acute bony abnormalities are identified. IMPRESSION: No active cardiopulmonary disease. Electronically Signed   By: Harmon Pier M.D.   On: 12/31/2019 13:25   CT ANGIO CHEST PE W OR WO CONTRAST  Result Date: 01/01/2020 CLINICAL DATA:  History of deep venous thrombosis and pulmonary embolus, acute shortness of breath EXAM: CT  ANGIOGRAPHY CHEST WITH CONTRAST TECHNIQUE: Multidetector CT imaging of the chest was performed using the standard protocol during bolus administration of intravenous contrast. Multiplanar CT image reconstructions and MIPs were obtained to evaluate the vascular anatomy. CONTRAST:  3mL OMNIPAQUE IOHEXOL 350 MG/ML SOLN COMPARISON:  12/31/2019, 08/16/2018 FINDINGS: Cardiovascular: This is a technically adequate evaluation of the pulmonary vasculature. There are no filling defects or pulmonary emboli. The main pulmonary arteries are dilated, consistent with pulmonary arterial hypertension. The heart is unremarkable without pericardial effusion. Normal caliber of the thoracic aorta. Minimal atherosclerosis. Mediastinum/Nodes: No enlarged mediastinal, hilar, or axillary lymph nodes. Thyroid gland, trachea, and esophagus demonstrate no significant findings. Lungs/Pleura: No  acute airspace disease, effusion, or pneumothorax. Central airways are patent. Stable 4 mm right middle lobe nodule image 174 and 3 mm right lower lobe nodule image 142 of series 6. No other pulmonary nodules. Upper Abdomen: No acute abnormality. Musculoskeletal: No acute or destructive bony lesions. Reconstructed images demonstrate no additional findings. Review of the MIP images confirms the above findings. IMPRESSION: 1. No evidence of pulmonary embolus. 2. Dilated pulmonary arteries consistent with pulmonary arterial hypertension. 3. Stable sub 4 mm nodules within the right middle and right lower lobes as above. Given long-term stability, no further imaging workup is required. This recommendation follows the consensus statement: Guidelines for Management of Incidental Pulmonary Nodules Detected on CT Images: From the Fleischner Society 2017; Radiology 2017; 284:228-243. 4.  Aortic Atherosclerosis (ICD10-I70.0). Electronically Signed   By: Sharlet Salina M.D.   On: 01/01/2020 00:32    EKG: Normal sinus rhythm with nonspecific T wave abnormalities  in the anterior/septal leads   ASSESSMENT AND PLAN:  1.  Chest pain/elevated troponin   -High sensitivity troponin borderline elevated, 29 and 19 respectively; ruled out for ACS  -As patient has already eaten this morning, will obtain echocardiogram today and order stress test for tomorrow morning   2.  History of a PE/DVT  -Provoked DVT, unprovoked PE - currently on Eliquis 2.5mg  BID  -Will increase Eliquis to 5mg  BID. Pt is less than 80, greather than 60 kg and creatinine is 0.88    3.  Hypertension   -Previously treated with antihypertensives, currently diet controlled    The history, physical exam findings, and plan of care were all discussed with Dr. , and all decision making was made in collaboration.   Signed: Harold Hedge PA-C 01/01/2020, 9:15 AM

## 2020-01-01 NOTE — ED Notes (Signed)
Pharmacy consulted and will look at pt eliquis dose due to previous 2.5mg  dose taken earlier this morning

## 2020-01-02 ENCOUNTER — Inpatient Hospital Stay: Payer: Self-pay

## 2020-01-02 DIAGNOSIS — R079 Chest pain, unspecified: Secondary | ICD-10-CM

## 2020-01-02 DIAGNOSIS — I200 Unstable angina: Secondary | ICD-10-CM

## 2020-01-02 LAB — NM MYOCAR MULTI W/SPECT W/WALL MOTION / EF
Estimated workload: 1 METS
Exercise duration (min): 1 min
Exercise duration (sec): 4 s
LV dias vol: 61 mL (ref 46–106)
LV sys vol: 22 mL
MPHR: 153 {beats}/min
Peak HR: 98 {beats}/min
Percent HR: 62 %
Rest HR: 75 {beats}/min
SDS: 0
SRS: 0
SSS: 0
TID: 0.88

## 2020-01-02 MED ORDER — TECHNETIUM TC 99M TETROFOSMIN IV KIT
30.0000 | PACK | Freq: Once | INTRAVENOUS | Status: AC | PRN
  Administered 2020-01-02: 28.59 via INTRAVENOUS

## 2020-01-02 MED ORDER — REGADENOSON 0.4 MG/5ML IV SOLN
0.4000 mg | Freq: Once | INTRAVENOUS | Status: AC
  Administered 2020-01-02: 0.4 mg via INTRAVENOUS

## 2020-01-02 MED ORDER — TECHNETIUM TC 99M TETROFOSMIN IV KIT
10.1800 | PACK | Freq: Once | INTRAVENOUS | Status: AC | PRN
  Administered 2020-01-02: 10.18 via INTRAVENOUS

## 2020-01-02 MED ORDER — APIXABAN 2.5 MG PO TABS: 5.0000 mg | ORAL_TABLET | Freq: Two times a day (BID) | ORAL | 2 refills | Status: DC

## 2020-01-02 NOTE — Progress Notes (Signed)
[facility]    SUBJECTIVE: Ms. Leah Roberts is a 69 year old female with a past medical history significant for DVT/PE, anticoagulated with Eliquis 2.5mg  BID and diet controlled hypertension who presented to the ED on 12/31/19 for a 2 week history of intermittent chest tightness with associated shortness of breath, palpitations, and weakness.  Workup in the ED has been significant for high sensitivity troponin elevated x 2, 29 and 19 respectively, COVID-19 negative, chest xray negative for acute cardiopulmonary disease, chest CT negative for a PE, and ECG revealing normal sinus rhythm with non-specific anterior T wave abnormalities.   01/01/20: She currently denies any chest pain, palpitations, or shortness of breath at rest, but admits to severe exertional dyspnea with minimal activity.  She has chronic bilateral lower extremity swelling from known varicose veins, but denies orthopnea or PND.  She also denies syncopal/presyncopal episodes.  She is currently eating and took Eliquis this morning.   01/02/20: Patient seen and evaluated during Lexiscan Myoview.  She is very anxious about the test, but denies recurrent chest pain or chest pressure.  Continues to experience exertional dyspnea, but this has improved since admission.  She experienced shortness of breath with the Lexi, that resolved within 4 minutes.   Vitals:   01/01/20 1059 01/01/20 2035 01/01/20 2058 01/02/20 0510  BP: 129/73 136/87 (!) 146/98 (!) 142/85  Pulse: 84 96 90 79  Resp: 20 20 20 20   Temp:  98.7 F (37.1 C) (!) 97.4 F (36.3 C) 98 F (36.7 C)  TempSrc:  Oral Oral Oral  SpO2: 97% 98% 100% 96%  Weight:      Height:        No intake or output data in the 24 hours ending 01/02/20 1136    PHYSICAL EXAM  General: Well developed, well nourished, in no acute distress HEENT:  Normocephalic and atramatic Neck:  No JVD.  Lungs: Clear bilaterally to auscultation and percussion. Heart: HRRR . Normal S1 and S2 without gallops  or murmurs.  Abdomen: Bowel sounds are positive, abdomen soft and non-tender  Msk:  Back normal.  Normal strength and tone for age. Extremities: No clubbing, cyanosis or edema.   Neuro: Alert and oriented X 3. Psych:  Good affect, responds appropriately   LABS: Basic Metabolic Panel: Recent Labs    12/31/19 1243 01/01/20 0236  NA 142 138  K 4.1 3.8  CL 110 112*  CO2 23 20*  GLUCOSE 98 109*  BUN 12 14  CREATININE 0.80 0.88  CALCIUM 9.1 7.8*   Liver Function Tests: No results for input(s): AST, ALT, ALKPHOS, BILITOT, PROT, ALBUMIN in the last 72 hours. No results for input(s): LIPASE, AMYLASE in the last 72 hours. CBC: Recent Labs    12/31/19 1243 01/01/20 0236  WBC 6.2 7.6  HGB 11.9* 10.7*  HCT 37.0 32.9*  MCV 83.5 81.2  PLT 226 222   Cardiac Enzymes: No results for input(s): CKTOTAL, CKMB, CKMBINDEX, TROPONINI in the last 72 hours. BNP: Invalid input(s): POCBNP D-Dimer: No results for input(s): DDIMER in the last 72 hours. Hemoglobin A1C: No results for input(s): HGBA1C in the last 72 hours. Fasting Lipid Panel: Recent Labs    01/01/20 0236  CHOL 133  HDL 29*  LDLCALC 76  TRIG [id number]  CHOLHDL 4.6   Thyroid Function Tests: No results for input(s): TSH, T4TOTAL, T3FREE, THYROIDAB in the last 72 hours.  Invalid input(s): FREET3 Anemia Panel: No results for input(s): VITAMINB12, FOLATE, FERRITIN, TIBC, IRON, RETICCTPCT in the last 72  hours.  DG Chest 2 View  Result Date: 12/31/2019 CLINICAL DATA:  Acute shortness of breath. EXAM: CHEST - 2 VIEW COMPARISON:  06/26/2018 radiograph and 08/16/2018 CT FINDINGS: The cardiomediastinal silhouette is unremarkable. There is no evidence of focal airspace disease, pulmonary edema, suspicious pulmonary nodule/mass, pleural effusion, or pneumothorax. No acute bony abnormalities are identified. IMPRESSION: No active cardiopulmonary disease. Electronically Signed   By: Harmon Pier M.D.   On: 12/31/2019 13:25   CT ANGIO  CHEST PE W OR WO CONTRAST  Result Date: 01/01/2020 CLINICAL DATA:  History of deep venous thrombosis and pulmonary embolus, acute shortness of breath EXAM: CT ANGIOGRAPHY CHEST WITH CONTRAST TECHNIQUE: Multidetector CT imaging of the chest was performed using the standard protocol during bolus administration of intravenous contrast. Multiplanar CT image reconstructions and MIPs were obtained to evaluate the vascular anatomy. CONTRAST:  70mL OMNIPAQUE IOHEXOL 350 MG/ML SOLN COMPARISON:  12/31/2019, 08/16/2018 FINDINGS: Cardiovascular: This is a technically adequate evaluation of the pulmonary vasculature. There are no filling defects or pulmonary emboli. The main pulmonary arteries are dilated, consistent with pulmonary arterial hypertension. The heart is unremarkable without pericardial effusion. Normal caliber of the thoracic aorta. Minimal atherosclerosis. Mediastinum/Nodes: No enlarged mediastinal, hilar, or axillary lymph nodes. Thyroid gland, trachea, and esophagus demonstrate no significant findings. Lungs/Pleura: No acute airspace disease, effusion, or pneumothorax. Central airways are patent. Stable 4 mm right middle lobe nodule image 174 and 3 mm right lower lobe nodule image 142 of series 6. No other pulmonary nodules. Upper Abdomen: No acute abnormality. Musculoskeletal: No acute or destructive bony lesions. Reconstructed images demonstrate no additional findings. Review of the MIP images confirms the above findings. IMPRESSION: 1. No evidence of pulmonary embolus. 2. Dilated pulmonary arteries consistent with pulmonary arterial hypertension. 3. Stable sub 4 mm nodules within the right middle and right lower lobes as above. Given long-term stability, no further imaging workup is required. This recommendation follows the consensus statement: Guidelines for Management of Incidental Pulmonary Nodules Detected on CT Images: From the Fleischner Society 2017; Radiology 2017; 284:228-243. 4.  Aortic  Atherosclerosis (ICD10-I70.0). Electronically Signed   By: Sharlet Salina M.D.   On: 01/01/2020 00:32   ECHOCARDIOGRAM COMPLETE  Result Date: 01/01/2020    ECHOCARDIOGRAM REPORT   Patient Name:   Leah Roberts Date of Exam: 01/01/2020 Medical Rec #:  [id number]           Height:       66.0 in Accession #:    [id number]          Weight:       223.8 lb Date of Birth:  February 12, 1951           BSA:          2.098 m Patient Age:    69 years            BP:           129/73 mmHg Patient Gender: F                   HR:           84 bpm. Exam Location:  [facility] Procedure: 2D Echo, Cardiac Doppler and Color Doppler Indications:     Chest pain 786.50  History:         Patient has no prior history of Echocardiogram examinations.                  Risk Factors:Hypertension. DVT, pulmonary embolism.  Sonographer:  Cristela Blue RDCS (AE) Referring Phys:  [id number] Mary Sella ECKSTAT Diagnosing Phys: Harold Hedge MD  Sonographer Comments: Suboptimal apical window. IMPRESSIONS  1. Left ventricular ejection fraction, by estimation, is 60 to 65%. The left ventricle has normal function. The left ventricle has no regional wall motion abnormalities. Left ventricular diastolic parameters are consistent with Grade I diastolic dysfunction (impaired relaxation).  2. Right ventricular systolic function is normal. The right ventricular size is normal. There is normal pulmonary artery systolic pressure.  3. The mitral valve is normal in structure. Trivial mitral valve regurgitation.  4. The aortic valve is normal in structure. Aortic valve regurgitation is not visualized. FINDINGS  Left Ventricle: Left ventricular ejection fraction, by estimation, is 60 to 65%. The left ventricle has normal function. The left ventricle has no regional wall motion abnormalities. The left ventricular internal cavity size was normal in size. There is  no left ventricular hypertrophy. Left ventricular diastolic parameters are consistent with Grade I diastolic  dysfunction (impaired relaxation). Right Ventricle: The right ventricular size is normal. No increase in right ventricular wall thickness. Right ventricular systolic function is normal. There is normal pulmonary artery systolic pressure. The tricuspid regurgitant velocity is 1.33 m/s, and  with an assumed right atrial pressure of 10 mmHg, the estimated right ventricular systolic pressure is 17.1 mmHg. Left Atrium: Left atrial size was normal in size. Right Atrium: Right atrial size was normal in size. Pericardium: There is no evidence of pericardial effusion. Mitral Valve: The mitral valve is normal in structure. Trivial mitral valve regurgitation. Tricuspid Valve: The tricuspid valve is not well visualized. Tricuspid valve regurgitation is trivial. Aortic Valve: The aortic valve is normal in structure. Aortic valve regurgitation is not visualized. Aortic valve mean gradient measures 4.0 mmHg. Aortic valve peak gradient measures 7.2 mmHg. Aortic valve area, by VTI measures 2.53 cm. Pulmonic Valve: The pulmonic valve was not well visualized. Pulmonic valve regurgitation is trivial. Aorta: The aortic root is normal in size and structure. IAS/Shunts: The interatrial septum was not assessed.  LEFT VENTRICLE PLAX 2D LVIDd:         4.62 cm  Diastology LVIDs:         2.60 cm  LV e' lateral:   7.72 cm/s LV PW:         0.90 cm  LV E/e' lateral: 9.4 LV IVS:        0.74 cm  LV e' medial:    6.42 cm/s LVOT diam:     2.10 cm  LV E/e' medial:  11.3 LV SV:         62 LV SV Index:   29 LVOT Area:     3.46 cm  RIGHT VENTRICLE RV Basal diam:  3.95 cm RV S prime:     15.10 cm/s TAPSE (M-mode): 3.2 cm LEFT ATRIUM             Index       RIGHT ATRIUM           Index LA diam:        3.50 cm 1.67 cm/m  RA Area:     22.80 cm LA Vol (A2C):   63.6 ml 30.32 ml/m RA Volume:   70.50 ml  33.61 ml/m LA Vol (A4C):   66.5 ml 31.70 ml/m LA Biplane Vol: 67.7 ml 32.28 ml/m  AORTIC VALVE                   PULMONIC VALVE AV Area (Vmax):  2.24  cm    PV Vmax:        0.85 m/s AV Area (Vmean):   2.27 cm    PV Peak grad:   2.9 mmHg AV Area (VTI):     2.53 cm    RVOT Peak grad: 3 mmHg AV Vmax:           134.50 cm/s AV Vmean:          94.600 cm/s AV VTI:            0.244 m AV Peak Grad:      7.2 mmHg AV Mean Grad:      4.0 mmHg LVOT Vmax:         86.80 cm/s LVOT Vmean:        62.100 cm/s LVOT VTI:          0.178 m LVOT/AV VTI ratio: 0.73  AORTA Ao Root diam: 3.10 cm MITRAL VALVE               TRICUSPID VALVE MV Area (PHT): 3.36 cm    TR Peak grad:   7.1 mmHg MV Decel Time: 226 msec    TR Vmax:        133.00 cm/s MV E velocity: 72.40 cm/s MV A velocity: 85.30 cm/s  SHUNTS MV E/A ratio:  0.85        Systemic VTI:  0.18 m                            Systemic Diam: 2.10 cm Harold Hedge MD Electronically signed by Harold Hedge MD Signature Date/Time: 01/01/2020/2:59:33 PM    Final      Echo:  Normal RV and LV systolic function with an EF estimated between 60-65% with no evidence of regional wall abnormalities; grade 1 diastolic dysfunction noted.  No evidence of significant valvular abnormalities   TELEMETRY: Normal sinus rhythm   ASSESSMENT AND PLAN:  Active Problems:   Pulmonary embolism (HCC)   Hyperlipidemia   Prediabetes   Positive PPD   Unstable angina (HCC)    1.  Exertional dyspnea/elevated troponin   -High sensitivity troponin borderline elevated, 29 and 19 respectively; ruled out for ACS  -Echocardiogram revealing normal LV systolic function with no significant valvular abnormalities; stress test completed, results pending   2.  History of DVT/PE   -Chest CT negative for PE this admission   -Will increase Eliquis from 2.5mg  BID to 5mg  BID   3.  Hypertension   -Previously treated with antihypertensives; currently diet controlled   The history, physical exam findings, and plan of care were all discussed with Dr. , and all decision making was made in collaboration.   Harold Hedge  PA-C 01/02/2020 11:36  AM

## 2020-01-02 NOTE — Discharge Summary (Signed)
Physician Discharge Summary   Leah Roberts  female DOB: 22-Jul-1950  YWV:[id number]  PCP: Allegra Grana, FNP  Admit date: 12/31/2019 Discharge date: 01/02/2020  Admitted From: home Disposition:  home CODE STATUS: Full code  Hospital Course:  For full details, please see H&P, progress notes, consult notes and ancillary notes.  Briefly,  Leah Roberts is a 69 year old AA female with a past medical history significant for DVT/PE, anticoagulated with Eliquis 2.5mg  BID anddiet controlledhypertension who presented to the ED on 12/31/19 for a 2 week history of intermittent chest tightness with associated shortness of breath, palpitations, and weakness. Workup in the ED has been significant for high sensitivity troponin elevated x 2, 29 and 19 respectively, COVID-19 negative, chest xray negative for acute cardiopulmonary disease, chest CT negative for a PE, and ECG revealing normal sinus rhythm with non-specific anterior T wave abnormalities.   Chest pain, ACS ruled out Echocardiogram revealing normal LV systolic function with no significant valvular abnormalities.  Stress test completed, no inducible ischemia.    History of DVT/PE on Eliquis Pt's Eliquis increased from 2.5mg  BID to 5mg  BID.  Pt requested medication assistance to help afford Eliquis as pt has no insurance.  TOC consulted to help [facility].  Hypertension  Previously treated with antihypertensives, currently diet controlled.   Discharge Diagnoses:  Active Problems:   Pulmonary embolism (HCC)   Hyperlipidemia   Prediabetes   Positive PPD   Unstable angina [facility])    Discharge Instructions:  Allergies as of 01/02/2020      Reactions   Sulfa Antibiotics Swelling, Rash   Sulfa Antibiotics Rash      Medication List    TAKE these medications   apixaban 2.5 MG Tabs tablet Commonly known as: ELIQUIS Take 2 tablets (5 mg total) by mouth 2 (two) times daily. What  changed: how much to take   loratadine 10 MG tablet Commonly known as: CLARITIN Take 10 mg by mouth daily.        Follow-up Information    03/03/2020, MD Follow up in 1 week(s).   Specialty: Cardiology Contact information: [address] [place] [id number]        [phone], FNP. Schedule an appointment as soon as possible for a visit in 1 week(s).   Specialty: Family Medicine Contact information: [address] [place] [phone]               Allergies  Allergen Reactions  . Sulfa Antibiotics Swelling and Rash  . Sulfa Antibiotics Rash     The results of significant diagnostics from this hospitalization (including imaging, microbiology, ancillary and laboratory) are listed below for reference.   Consultations:   Procedures/Studies: DG Chest 2 View  Result Date: 12/31/2019 CLINICAL DATA:  Acute shortness of breath. EXAM: CHEST - 2 VIEW COMPARISON:  06/26/2018 radiograph and 08/16/2018 CT FINDINGS: The cardiomediastinal silhouette is unremarkable. There is no evidence of focal airspace disease, pulmonary edema, suspicious pulmonary nodule/mass, pleural effusion, or pneumothorax. No acute bony abnormalities are identified. IMPRESSION: No active cardiopulmonary disease. Electronically Signed   By: 08/18/2018 M.D.   On: 12/31/2019 13:25   CT ANGIO CHEST PE W OR WO CONTRAST  Result Date: 01/01/2020 CLINICAL DATA:  History of deep venous thrombosis and pulmonary embolus, acute shortness of breath EXAM: CT ANGIOGRAPHY CHEST WITH CONTRAST TECHNIQUE: Multidetector CT imaging of the chest was performed using the standard protocol during  bolus administration of intravenous contrast. Multiplanar CT image reconstructions and MIPs were obtained to evaluate the vascular anatomy. CONTRAST:  97mL OMNIPAQUE IOHEXOL 350 MG/ML SOLN COMPARISON:  12/31/2019, 08/16/2018 FINDINGS: Cardiovascular: This is a technically  adequate evaluation of the pulmonary vasculature. There are no filling defects or pulmonary emboli. The main pulmonary arteries are dilated, consistent with pulmonary arterial hypertension. The heart is unremarkable without pericardial effusion. Normal caliber of the thoracic aorta. Minimal atherosclerosis. Mediastinum/Nodes: No enlarged mediastinal, hilar, or axillary lymph nodes. Thyroid gland, trachea, and esophagus demonstrate no significant findings. Lungs/Pleura: No acute airspace disease, effusion, or pneumothorax. Central airways are patent. Stable 4 mm right middle lobe nodule image 174 and 3 mm right lower lobe nodule image 142 of series 6. No other pulmonary nodules. Upper Abdomen: No acute abnormality. Musculoskeletal: No acute or destructive bony lesions. Reconstructed images demonstrate no additional findings. Review of the MIP images confirms the above findings. IMPRESSION: 1. No evidence of pulmonary embolus. 2. Dilated pulmonary arteries consistent with pulmonary arterial hypertension. 3. Stable sub 4 mm nodules within the right middle and right lower lobes as above. Given long-term stability, no further imaging workup is required. This recommendation follows the consensus statement: Guidelines for Management of Incidental Pulmonary Nodules Detected on CT Images: From the Fleischner Society 2017; Radiology 2017; 284:228-243. 4.  Aortic Atherosclerosis (ICD10-I70.0). Electronically Signed   By: Sharlet Salina M.D.   On: 01/01/2020 00:32   NM Myocar Multi W/Spect W/Wall Motion / EF  Result Date: 01/02/2020  There was no ST segment deviation noted during stress.  The study is normal.  This is a low risk study.  The left ventricular ejection fraction is normal (55-65%).  Negative lexisca stress. Lv function normal No ischemia. Low risk study   ECHOCARDIOGRAM COMPLETE  Result Date: 01/01/2020    ECHOCARDIOGRAM REPORT   Patient Name:   Leah Roberts Date of Exam: 01/01/2020 Medical Rec #:   [id number]           Height:       66.0 in Accession #:    [id number]          Weight:       223.8 lb Date of Birth:  [birth year]           BSA:          2.098 m Patient Age:    69 years            BP:           129/73 mmHg Patient Gender: F                   HR:           84 bpm. Exam Location:  [facility] Procedure: 2D Echo, Cardiac Doppler and Color Doppler Indications:     Chest pain 786.50  History:         Patient has no prior history of Echocardiogram examinations.                  Risk Factors:Hypertension. DVT, pulmonary embolism.  Sonographer:     Cristela Blue RDCS (AE) Referring Phys:  [id number] Mary Sella ECKSTAT Diagnosing Phys: Harold Hedge MD  Sonographer Comments: Suboptimal apical window. IMPRESSIONS  1. Left ventricular ejection fraction, by estimation, is 60 to 65%. The left ventricle has normal function. The left ventricle has no regional wall motion abnormalities. Left ventricular diastolic parameters are consistent with Grade I diastolic dysfunction (impaired relaxation).  2. Right ventricular  systolic function is normal. The right ventricular size is normal. There is normal pulmonary artery systolic pressure.  3. The mitral valve is normal in structure. Trivial mitral valve regurgitation.  4. The aortic valve is normal in structure. Aortic valve regurgitation is not visualized. FINDINGS  Left Ventricle: Left ventricular ejection fraction, by estimation, is 60 to 65%. The left ventricle has normal function. The left ventricle has no regional wall motion abnormalities. The left ventricular internal cavity size was normal in size. There is  no left ventricular hypertrophy. Left ventricular diastolic parameters are consistent with Grade I diastolic dysfunction (impaired relaxation). Right Ventricle: The right ventricular size is normal. No increase in right ventricular wall thickness. Right ventricular systolic function is normal. There is normal pulmonary artery systolic pressure. The tricuspid regurgitant  velocity is 1.33 m/s, and  with an assumed right atrial pressure of 10 mmHg, the estimated right ventricular systolic pressure is 17.1 mmHg. Left Atrium: Left atrial size was normal in size. Right Atrium: Right atrial size was normal in size. Pericardium: There is no evidence of pericardial effusion. Mitral Valve: The mitral valve is normal in structure. Trivial mitral valve regurgitation. Tricuspid Valve: The tricuspid valve is not well visualized. Tricuspid valve regurgitation is trivial. Aortic Valve: The aortic valve is normal in structure. Aortic valve regurgitation is not visualized. Aortic valve mean gradient measures 4.0 mmHg. Aortic valve peak gradient measures 7.2 mmHg. Aortic valve area, by VTI measures 2.53 cm. Pulmonic Valve: The pulmonic valve was not well visualized. Pulmonic valve regurgitation is trivial. Aorta: The aortic root is normal in size and structure. IAS/Shunts: The interatrial septum was not assessed.  LEFT VENTRICLE PLAX 2D LVIDd:         4.62 cm  Diastology LVIDs:         2.60 cm  LV e' lateral:   7.72 cm/s LV PW:         0.90 cm  LV E/e' lateral: 9.4 LV IVS:        0.74 cm  LV e' medial:    6.42 cm/s LVOT diam:     2.10 cm  LV E/e' medial:  11.3 LV SV:         62 LV SV Index:   29 LVOT Area:     3.46 cm  RIGHT VENTRICLE RV Basal diam:  3.95 cm RV S prime:     15.10 cm/s TAPSE (M-mode): 3.2 cm LEFT ATRIUM             Index       RIGHT ATRIUM           Index LA diam:        3.50 cm 1.67 cm/m  RA Area:     22.80 cm LA Vol (A2C):   63.6 ml 30.32 ml/m RA Volume:   70.50 ml  33.61 ml/m LA Vol (A4C):   66.5 ml 31.70 ml/m LA Biplane Vol: 67.7 ml 32.28 ml/m  AORTIC VALVE                   PULMONIC VALVE AV Area (Vmax):    2.24 cm    PV Vmax:        0.85 m/s AV Area (Vmean):   2.27 cm    PV Peak grad:   2.9 mmHg AV Area (VTI):     2.53 cm    RVOT Peak grad: 3 mmHg AV Vmax:           134.50 cm/s AV  Vmean:          94.600 cm/s AV VTI:            0.244 m AV Peak Grad:      7.2 mmHg AV  Mean Grad:      4.0 mmHg LVOT Vmax:         86.80 cm/s LVOT Vmean:        62.100 cm/s LVOT VTI:          0.178 m LVOT/AV VTI ratio: 0.73  AORTA Ao Root diam: 3.10 cm MITRAL VALVE               TRICUSPID VALVE MV Area (PHT): 3.36 cm    TR Peak grad:   7.1 mmHg MV Decel Time: 226 msec    TR Vmax:        133.00 cm/s MV E velocity: 72.40 cm/s MV A velocity: 85.30 cm/s  SHUNTS MV E/A ratio:  0.85        Systemic VTI:  0.18 m                            Systemic Diam: 2.10 cm Harold Hedge MD Electronically signed by Harold Hedge MD Signature Date/Time: 01/01/2020/2:59:33 PM    Final       Labs: BNP (last 3 results) No results for input(s): BNP in the last 8760 hours. Basic Metabolic Panel: Recent Labs  Lab 12/31/19 1243 01/01/20 0236  NA 142 138  K 4.1 3.8  CL 110 112*  CO2 23 20*  GLUCOSE 98 109*  BUN 12 14  CREATININE 0.80 0.88  CALCIUM 9.1 7.8*   Liver Function Tests: No results for input(s): AST, ALT, ALKPHOS, BILITOT, PROT, ALBUMIN in the last 168 hours. No results for input(s): LIPASE, AMYLASE in the last 168 hours. No results for input(s): AMMONIA in the last 168 hours. CBC: Recent Labs  Lab 12/31/19 1243 01/01/20 0236  WBC 6.2 7.6  HGB 11.9* 10.7*  HCT 37.0 32.9*  MCV 83.5 81.2  PLT 226 222   Cardiac Enzymes: No results for input(s): CKTOTAL, CKMB, CKMBINDEX, TROPONINI in the last 168 hours. BNP: Invalid input(s): POCBNP CBG: No results for input(s): GLUCAP in the last 168 hours. D-Dimer No results for input(s): DDIMER in the last 72 hours. Hgb A1c No results for input(s): HGBA1C in the last 72 hours. Lipid Profile Recent Labs    01/01/20 0236  CHOL 133  HDL 29*  LDLCALC 76  TRIG 409  CHOLHDL 4.6   Thyroid function studies No results for input(s): TSH, T4TOTAL, T3FREE, THYROIDAB in the last 72 hours.  Invalid input(s): FREET3 Anemia work up No results for input(s): VITAMINB12, FOLATE, FERRITIN, TIBC, IRON, RETICCTPCT in the last 72 hours. Urinalysis      Component Value Date/Time   COLORURINE YELLOW (A) 11/02/2015 1335   APPEARANCEUR HAZY (A) 11/02/2015 1335   LABSPEC 1.018 11/02/2015 1335   PHURINE 5.0 11/02/2015 1335   GLUCOSEU NEGATIVE 11/02/2015 1335   HGBUR NEGATIVE 11/02/2015 1335   BILIRUBINUR NEGATIVE 11/02/2015 1335   KETONESUR NEGATIVE 11/02/2015 1335   PROTEINUR NEGATIVE 11/02/2015 1335   NITRITE NEGATIVE 11/02/2015 1335   LEUKOCYTESUR 1+ (A) 11/02/2015 1335   Sepsis Labs Invalid input(s): PROCALCITONIN,  WBC,  LACTICIDVEN Microbiology Recent Results (from the past 240 hour(s))  SARS Coronavirus 2 by RT PCR (hospital order, performed in [facility] hospital lab) Nasopharyngeal Nasopharyngeal Swab     Status: None  Collection Time: 12/31/19 10:42 PM   Specimen: Nasopharyngeal Swab  Result Value Ref Range Status   SARS Coronavirus 2 NEGATIVE NEGATIVE Final    Comment: (NOTE) SARS-CoV-2 target nucleic acids are NOT DETECTED.  The SARS-CoV-2 RNA is generally detectable in upper and lower respiratory specimens during the acute phase of infection. The lowest concentration of SARS-CoV-2 viral copies this assay can detect is 250 copies / mL. A negative result does not preclude SARS-CoV-2 infection and should not be used as the sole basis for treatment or other patient management decisions.  A negative result may occur with improper specimen collection / handling, submission of specimen other than nasopharyngeal swab, presence of viral mutation(s) within the areas targeted by this assay, and inadequate number of viral copies (<250 copies / mL). A negative result must be combined with clinical observations, patient history, and epidemiological information.  Fact Sheet for Patients:   BoilerBrush.com.cy  Fact Sheet for Healthcare Providers: https://pope.com/  This test is not yet approved or  cleared by the [place] FDA and has been authorized for detection and/or  diagnosis of SARS-CoV-2 by FDA under an Emergency Use Authorization (EUA).  This EUA will remain in effect (meaning this test can be used) for the duration of the COVID-19 declaration under Section 564(b)(1) of the Act, 21 U.S.C. section 360bbb-3(b)(1), unless the authorization is terminated or revoked sooner.  Performed at [facility], [address] [place], [place] [address]      Total time spend on discharging this patient, including the last patient exam, discussing the hospital stay, instructions for ongoing care as it relates to all pertinent caregivers, as well as preparing the medical discharge records, prescriptions, and/or referrals as applicable, is 45 minutes.    Darlin Priestly, MD  [facility] 01/02/2020, 4:57 PM  If 7PM-7AM, please contact night-coverage

## 2020-01-02 NOTE — Progress Notes (Signed)
Mobility Specialist - Progress Note   01/02/20 1632  Mobility  Activity Refused mobility  Mobility performed by Mobility specialist     Pt refused mobility at this time d/t feeling tired and wanting to rest. Will re-attempt session at a later date/time.     Juleon Narang Mobility Specialist  01/02/20, 4:34 PM

## 2020-01-02 NOTE — Progress Notes (Signed)
Leah Roberts to be D/C'd home per MD order.  Discussed prescriptions and follow up appointments with the patient. Prescriptions given to patient, medication list explained in detail. Pt verbalized understanding.  Allergies as of 01/02/2020       Reactions   Sulfa Antibiotics Swelling, Rash   Sulfa Antibiotics Rash        Medication List     TAKE these medications    apixaban 2.5 MG Tabs tablet Commonly known as: ELIQUIS Take 2 tablets (5 mg total) by mouth 2 (two) times daily. What changed: how much to take   loratadine 10 MG tablet Commonly known as: CLARITIN Take 10 mg by mouth daily.        Vitals:   01/02/20 0510 01/02/20 1203  BP: (!) 142/85 (!) 157/86  Pulse: 79 79  Resp: 20 18  Temp: 98 F (36.7 C) 97.8 F (36.6 C)  SpO2: 96% 94%    Skin clean, dry and intact without evidence of skin break down, no evidence of skin tears noted. IV catheter discontinued intact. Site without signs and symptoms of complications. Dressing and pressure applied. Pt denies pain at this time. No complaints noted.  An After Visit Summary was printed and given to the patient. Patient escorted via WC, and D/C home via private auto.  Leah Roberts Leah Roberts

## 2020-01-03 ENCOUNTER — Telehealth: Payer: Self-pay

## 2020-01-03 DIAGNOSIS — I2699 Other pulmonary embolism without acute cor pulmonale: Secondary | ICD-10-CM

## 2020-01-03 NOTE — Telephone Encounter (Signed)
If patient calls to schedule hospital follow up it does not qualify for transitional care management. Standard hfu coverage only due to insurance. TCM does not cover standard [employer] or [place].

## 2020-01-04 MED ORDER — APIXABAN 2.5 MG PO TABS: 5.0000 mg | ORAL_TABLET | Freq: Two times a day (BID) | ORAL | 2 refills | Status: DC

## 2020-01-04 NOTE — Telephone Encounter (Signed)
Leah Roberts,   Thanks for jumping in here. I have sent in .    Pt sch 01/09/20 with me I sent eliquis to med management  Per vanessa, she has advised pt that we will send in.

## 2020-01-04 NOTE — Addendum Note (Signed)
Addended by: Allegra Grana on: 01/04/2020 11:31 AM   Modules accepted: Orders

## 2020-01-04 NOTE — Telephone Encounter (Signed)
Patient called clinic w/ cost concerns w/ Eliquis. Appears she doesn't have full Medicaid coverage.   Please send Eliquis 2.5 mg BID [facility]. Erie Noe is notifying patient of this plan.

## 2020-01-09 ENCOUNTER — Telehealth: Payer: Self-pay | Admitting: Family

## 2020-01-09 ENCOUNTER — Ambulatory Visit (INDEPENDENT_AMBULATORY_CARE_PROVIDER_SITE_OTHER): Payer: Self-pay | Admitting: Family

## 2020-01-09 ENCOUNTER — Other Ambulatory Visit: Payer: Self-pay

## 2020-01-09 ENCOUNTER — Encounter: Payer: Self-pay | Admitting: Family

## 2020-01-09 VITALS — BP 122/70 | HR 89 | Temp 98.2°F | Ht 66.0 in | Wt 222.6 lb

## 2020-01-09 DIAGNOSIS — D649 Anemia, unspecified: Secondary | ICD-10-CM | POA: Insufficient documentation

## 2020-01-09 DIAGNOSIS — I200 Unstable angina: Secondary | ICD-10-CM

## 2020-01-09 DIAGNOSIS — I2699 Other pulmonary embolism without acute cor pulmonale: Secondary | ICD-10-CM

## 2020-01-09 DIAGNOSIS — Z23 Encounter for immunization: Secondary | ICD-10-CM

## 2020-01-09 DIAGNOSIS — D619 Aplastic anemia, unspecified: Secondary | ICD-10-CM

## 2020-01-09 LAB — IBC + FERRITIN
Ferritin: 68.3 ng/mL (ref 10.0–291.0)
Iron: 68 ug/dL (ref 42–145)
Saturation Ratios: 18.1 % — ABNORMAL LOW (ref 20.0–50.0)
Transferrin: 269 mg/dL (ref 212.0–360.0)

## 2020-01-09 LAB — B12 AND FOLATE PANEL
Folate: 21.4 ng/mL (ref >5.9)
Vitamin B-12: 452 pg/mL (ref 211–911)

## 2020-01-09 NOTE — Progress Notes (Signed)
Subjective:    Patient ID: Leah Roberts, female    DOB: May 18, 1950, 69 y.o.   MRN: [id number]  CC: Leah Roberts is a 69 y.o. female who presents today for follow up.   HPI: Feels well today No complaints No further chest pain or tightness.  No sob, left arm pain.   No blood from stool or nose. No vaginal bleeding.   Anemia  Due colonoscopy- declines at this ime.   Elevated CV risk .  No strong h/o CVD.     LDL 76   Presented to ED for chest tightness, admitted 12/31/19 Troponin elevated x 2 , 29 and 19 Covid negative CXR negative for acute findings CTA negative for PE Stress test 01/01/20 , no inducible ischemia, low risk study echco 01/01/20 EF 60-65% H/o PE x 2 - eliquis increased to 5mg BID per Dr consult note.   Follow up with Dr America Brown - not scheduled however hasnt called them back as felt it has been too much.   HISTORY:  Past Medical History:  Diagnosis Date  . DVT (deep venous thrombosis) (HCC)   . DVT (deep venous thrombosis) (HCC) 2013  . History of hypertension   . Hypertension   . Pulmonary embolism (HCC) 2013   Past Surgical History:  Procedure Laterality Date  . APPENDECTOMY    . TONSILLECTOMY AND ADENOIDECTOMY     Family History  Problem Relation Age of Onset  . Hypertension Father   . Thyroid disease Sister   . Allergies Sister   . CVA Neg Hx   . Heart attack Neg Hx   . High Cholesterol Neg Hx   . Colon cancer Neg Hx     Allergies: Sulfa antibiotics and Sulfa antibiotics Current Outpatient Medications on File Prior to Visit  Medication Sig Dispense Refill  . apixaban (ELIQUIS) 2.5 MG TABS tablet Take 2 tablets (5 mg total) by mouth 2 (two) times daily. 120 tablet 2  . gabapentin (NEURONTIN) 100 MG capsule Take 100 mg by mouth as needed.    . loratadine (CLARITIN) 10 MG tablet Take 10 mg by mouth daily.     No current facility-administered medications on file prior to visit.    Social History   Tobacco Use  .  Smoking status: Never Smoker  . Smokeless tobacco: Never Used  Substance Use Topics  . Alcohol use: No  . Drug use: No    Review of Systems  Constitutional: Negative for chills and fever.  Respiratory: Negative for cough, chest tightness and shortness of breath.   Cardiovascular: Negative for chest pain and palpitations.  Gastrointestinal: Negative for nausea and vomiting.      Objective:    BP 122/70   Pulse 89   Temp 98.2 F (36.8 C)   Ht 5\' 6"  (1.676 m)   Wt 222 lb 9.6 oz (101 kg)   SpO2 98%   BMI 35.93 kg/m  BP Readings from Last 3 Encounters:  01/09/20 122/70  01/02/20 (!) 157/86  12/31/19 120/82   Wt Readings from Last 3 Encounters:  01/09/20 222 lb 9.6 oz (101 kg)  12/31/19 223 lb 12.3 oz (101.5 kg)  12/31/19 223 lb (101.2 kg)    Physical Exam Vitals reviewed.  Constitutional:      Appearance: She is well-developed.  Eyes:     Conjunctiva/sclera: Conjunctivae normal.  Cardiovascular:     Rate and Rhythm: Normal rate and regular rhythm.     Pulses: Normal pulses.     Heart sounds:  Normal heart sounds.  Pulmonary:     Effort: Pulmonary effort is normal.     Breath sounds: Normal breath sounds. No wheezing, rhonchi or rales.  Skin:    General: Skin is warm and dry.  Neurological:     Mental Status: She is alert.  Psychiatric:        Speech: Speech normal.        Behavior: Behavior normal.        Thought Content: Thought content normal.        Assessment & Plan:   Problem List Items Addressed This Visit      Cardiovascular and Mediastinum   Pulmonary embolism (HCC)    Increased eliquis after hospitalization. Awaiting advise from Dr Lady Gary in regards to resuming PE Ppx dose 2.5mg  BID      Relevant Orders   B12 and Folate Panel   IBC + Ferritin   Fecal occult blood, imunochemical   Unstable angina (HCC)    Chest pain resolved. Reviewed hospitalization. Patient overwhelmed which is understandable and worried about costs ( she is self pay). She  politely declines returning or scheduling an appointment with Dr Lady Gary, cardiology, at this time. Call to to Dr Lady Gary to advise on whether to return to prior dose of eliquis 2.5mg  BID or to continue the eliquis 5mg  indefinitely.         Other   Anemia    Pending  Iron studies, stool cards. Explained importance for colonoscopy to screen for colon cancer and also in context of anemia. She verbalized understanding  However declines referral at this time.  Will follow        Other Visit Diagnoses    Aplastic anemia (HCC)    -  Primary   Need for immunization against influenza       Relevant Orders   Flu Vaccine QUAD High Dose(Fluad) (Completed)       I am having Leah Roberts maintain her loratadine, apixaban, and gabapentin.   No orders of the defined types were placed in this encounter.   Return precautions given.   Risks, benefits, and alternatives of the medications and treatment plan prescribed today were discussed, and patient expressed understanding.   Education regarding symptom management and diagnosis given to patient on AVS.  Continue to follow with , FNP for routine health maintenance.   Leah Roberts and I agreed with plan.   Oralia Rud, FNP

## 2020-01-09 NOTE — Telephone Encounter (Signed)
Call dr fath's  Nurse [phone]  Saw pt today whom he met 8/31 in hospital He increased eliquis to 71m BID at that time She has h/o pulmonary embolism ( 2)  Patient is self pay and expressed anxiety about paying for recent hospitalization, tests ect. She feels overwhelmed about seeing cardiology at this time.   Does he feel imminent that patient sees him?  Does he advise patient to resume prior dose of eliquis 2.51mBID or should she continue eliquis 64m76mID?  Thank you! He can call my cell as well  [phone]

## 2020-01-09 NOTE — Patient Instructions (Addendum)
Your healthy cholesterol is low so I would tell you to eat more healthy 'fats' such as avocados, nuts, beans, and olive oil in moderation of course.   We will watch cholesterol   Take 0.5 to 5mg  melatonin at 7pm with dinner -this is when natural melatonin will start to increase  Please return stool cards  Stay on eliquis 5mg  twice per day until I speak with Dr  Stay safe!

## 2020-01-09 NOTE — Assessment & Plan Note (Signed)
Increased eliquis after hospitalization. Awaiting advise from Dr Lady Gary in regards to resuming PE Ppx dose 2.5mg  BID

## 2020-01-09 NOTE — Assessment & Plan Note (Signed)
Chest pain resolved. Reviewed hospitalization. Patient overwhelmed which is understandable and worried about costs ( she is self pay). She politely declines returning or scheduling an appointment with Dr Lady Gary, cardiology, at this time. Call to to Dr Lady Gary to advise on whether to return to prior dose of eliquis 2.5mg  BID or to continue the eliquis 5mg  indefinitely.

## 2020-01-09 NOTE — Telephone Encounter (Signed)
Dr. America Brown nurse, Herbert Seta was not  in the office at the time. I have sent message asking her to call me back.

## 2020-01-09 NOTE — Assessment & Plan Note (Signed)
Pending  Iron studies, stool cards. Explained importance for colonoscopy to screen for colon cancer and also in context of anemia. She verbalized understanding  However declines referral at this time.  Will follow

## 2020-01-14 NOTE — Telephone Encounter (Signed)
I called and asked for another message to be sent to [facility], Dr. America Brown nurse.  She stated that patient was not scheduled at his office at all.

## 2020-01-15 ENCOUNTER — Other Ambulatory Visit: Payer: Self-pay | Admitting: Family

## 2020-01-15 ENCOUNTER — Other Ambulatory Visit (INDEPENDENT_AMBULATORY_CARE_PROVIDER_SITE_OTHER): Payer: Self-pay

## 2020-01-15 DIAGNOSIS — I2699 Other pulmonary embolism without acute cor pulmonale: Secondary | ICD-10-CM

## 2020-01-15 LAB — FECAL OCCULT BLOOD, IMMUNOCHEMICAL: Fecal Occult Bld: NEGATIVE

## 2020-01-21 NOTE — Telephone Encounter (Signed)
I called and spoke with April at Dr. America Brown office she called back to [facility], who stated that she had not received any messages on patient. I explained that I was told by who took previous message that they were sending a staff message since patient had not been seen there. April took down message & said that she would call back to Herbert Seta to make sure she also checked her staff messages. Herbert Seta should call back this time once she speaks with Dr. Lady Gary. She did state that it would not be today most likely bc he is on call.

## 2020-01-21 NOTE — Telephone Encounter (Signed)
Would you circle back-  Pt is very worried about bils right now and wanted ot hold on seeing dr Ubaldo Glassing for a couple of months She met him in the hospital and he increased her eliquis  Can you refer to my original message and see what dr Ubaldo Glassing advised about eliquis?  Im happy to speak with him as well.

## 2020-02-06 NOTE — Telephone Encounter (Signed)
I called was able to speak with Leah Roberts, Dr. America Brown nurse. She was going to get in touch with him today & call me back to let me know what he advised. She also stated that adult patient's on eliquis usually stay on 5mg  dose that are under 67 years of age.

## 2020-02-06 NOTE — Telephone Encounter (Signed)
Call pt Please advise that we havent gotten guidance from Dr Ubaldo Glassing, cardiologist,  Whom she met in hospitalization 12/31/19 I strongly advise her to have an appt with him to discuss on going dose of eliquis.  Is she okay with my making a referral?  Please ask her if we can also make a referral for social work in regards to financial resources. Does she have [facility] information for financial assistance? Would it be helpful if we mailed [facility] information / forms to her ?

## 2020-02-22 NOTE — Telephone Encounter (Signed)
Call pt Please let her know that we have yet to hear back from Dr America Brown office and you have called a couple of time I would strongly advise that she has a consult with him Or if she would prefer to see another cardiologist, we can refer her to [facility].   Please let me know how she is doing on the eliquis 5mg  BID. Please ensure no bleeding

## 2020-02-22 NOTE — Telephone Encounter (Signed)
I spoke with patient & she is okay with being referred to cardiology. She did not tell me preference of where. She is now in [place] & I advised that when they call to schedule appointment she schedules for when she returns here. She also wanted to know how long she should take iron. She has no constipation issues,  but sometimes it hurts her stomach so she has been taking two tablets a day. She feels this is more tolerable,  but wants to know if she needs to keep taking them?

## 2020-02-25 NOTE — Telephone Encounter (Signed)
Call pt Referral to cardiology in place to Dr Lady Gary   Let us know if you dont hear back within a week in regards to an appointment being scheduled.   sch follow up in early December and we can recheck iron stores Please add ron to her med list; it is fine to take ferrous sulfate 325mg  BID. She can even move to daily if bother some.

## 2020-02-26 ENCOUNTER — Other Ambulatory Visit: Payer: Self-pay

## 2020-02-26 MED ORDER — FERROUS SULFATE 325 (65 FE) MG PO TBEC: 325.0000 mg | DELAYED_RELEASE_TABLET | Freq: Two times a day (BID) | ORAL | 0 refills | Status: DC

## 2020-02-26 NOTE — Telephone Encounter (Signed)
Pt aware to be on the lookout for a call to get scheduled. She has f/u 12/8 & I have added ferrous sulfate 325mg  to med list.

## 2020-03-06 ENCOUNTER — Telehealth: Payer: Self-pay | Admitting: Family

## 2020-03-06 NOTE — Telephone Encounter (Signed)
Rejection Reason - Patient did not respond - left 3 message for patient to call for appointment with Cardiology and patient did not respond to calls. " [facility] said on Mar 06, 2020 11:38 AM  "left 3 messages for patient to call our office to schedule appt. " [facility] said on Mar 04, 2020 2:55 PM  "left 2nd message to call for appointment with dr. Lady Gary " [facility] said on Feb 28, 2020 3:33 PM  "Left message for patient to call the office about making appointment with Dr. Lady Gary " [facility] said on Feb 27, 2020 10:52 AM

## 2020-03-07 NOTE — Telephone Encounter (Signed)
noted 

## 2020-03-26 ENCOUNTER — Other Ambulatory Visit: Payer: Self-pay

## 2020-03-26 ENCOUNTER — Ambulatory Visit: Payer: Self-pay | Admitting: Pharmacy Technician

## 2020-03-26 DIAGNOSIS — Z79899 Other long term (current) drug therapy: Secondary | ICD-10-CM

## 2020-03-26 NOTE — Progress Notes (Signed)
Patient is a legal resident.  Is not a citizen.  Even though patient is over 67 years of age, patient does not qualify for Medicare.  Patient has not resided in [place] long enough to qualify for Medicare.  [facility] application and contract.  Patient agreed to all terms of the [facility] contract.    Patient approved to receive medication assistance at [facility] until time for re-certification in 8833, and as long as eligibility criteria continues to be met.    Provided patient with community resource material based on her particular needs.    [facility]

## 2020-04-09 ENCOUNTER — Other Ambulatory Visit: Payer: Self-pay

## 2020-04-09 ENCOUNTER — Ambulatory Visit: Payer: Self-pay | Admitting: Family

## 2020-04-09 DIAGNOSIS — I2699 Other pulmonary embolism without acute cor pulmonale: Secondary | ICD-10-CM

## 2020-04-09 MED ORDER — GABAPENTIN 100 MG PO CAPS: 100.0000 mg | ORAL_CAPSULE | Freq: Three times a day (TID) | ORAL | 1 refills | Status: DC

## 2020-04-09 MED ORDER — APIXABAN 2.5 MG PO TABS: 5.0000 mg | ORAL_TABLET | Freq: Two times a day (BID) | ORAL | 2 refills | Status: DC

## 2020-04-24 ENCOUNTER — Telehealth: Payer: Self-pay | Admitting: Pharmacist

## 2020-04-24 NOTE — Telephone Encounter (Signed)
04/24/2020 10:16:24 AM - Eliquis faxed to [facility]  -- Rhetta Mura - Thursday, April 24, 2020 10:15 AM --[employer]ovie [employer] application for Eliquis 5mg  Take one tablet by mouth 2 times a day for enrollment.

## 2020-05-26 ENCOUNTER — Encounter: Payer: Self-pay | Admitting: Family

## 2020-05-26 ENCOUNTER — Telehealth (INDEPENDENT_AMBULATORY_CARE_PROVIDER_SITE_OTHER): Payer: Self-pay | Admitting: Family

## 2020-05-26 VITALS — BP 122/80 | Ht 66.0 in | Wt 221.0 lb

## 2020-05-26 DIAGNOSIS — M25541 Pain in joints of right hand: Secondary | ICD-10-CM

## 2020-05-26 DIAGNOSIS — M25561 Pain in right knee: Secondary | ICD-10-CM

## 2020-05-26 DIAGNOSIS — M79642 Pain in left hand: Secondary | ICD-10-CM

## 2020-05-26 DIAGNOSIS — M25562 Pain in left knee: Secondary | ICD-10-CM

## 2020-05-26 DIAGNOSIS — M79641 Pain in right hand: Secondary | ICD-10-CM

## 2020-05-26 DIAGNOSIS — D649 Anemia, unspecified: Secondary | ICD-10-CM

## 2020-05-26 DIAGNOSIS — I2699 Other pulmonary embolism without acute cor pulmonale: Secondary | ICD-10-CM

## 2020-05-26 MED ORDER — DICLOFENAC SODIUM 1 % EX GEL: 4.0000 g | Freq: Four times a day (QID) | CUTANEOUS | 3 refills | Status: DC

## 2020-05-26 NOTE — Assessment & Plan Note (Signed)
Compliant with eliquis 5mg  BID. I have sent a note to Dr in regards to follow up with him and continuation with this dose.

## 2020-05-26 NOTE — Assessment & Plan Note (Addendum)
Anticipate improved. Negative occult blood 02/2020. She declines colonoscopy which again I reiterated the importance of today. Pending anemia studies.

## 2020-05-26 NOTE — Assessment & Plan Note (Signed)
Chronic. Unchanged. Pursing autoimmune labs and baseline xrays. Difficult to assess over telephone, advised in person evaluation at follow up. Refilled voltaren gel.

## 2020-05-26 NOTE — Progress Notes (Signed)
Verbal consent for services obtained from patient prior to services given to TELEPHONE visit:   Location of call:  provider at work patient at home  Names of all persons present for services: Leah Plowman, NP and patient Chief complaint:  Complains of stiffness in hands, knees, wrists 'forever'. Comes and goes. Stiffness improves with activity. Fingers and wrist are swollen. No redness, rash, increased heat. Voltaren gel has been helpful and would like refill. Taking tylenol extra strength with temporary relief. Has been taking gabapentin 100mg  TID. No back pain, leg numbness.   No h/o RA.  Compliant with eliquis 5mg  BID. No bleeding, CP, palpitations.   Increased eliquis after hospitalization. Awaiting advise from Dr in regards to resuming PE Ppx dose 2.5mg  BID  Anemia- has been taking ferrous sulfate 325mg . No constipation, blood in stool, nor stool is pencil in shape.   Due colonoscopy.   Hospitalized for Chest pain 12/2019 Elevated troponin.  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  Dr Lady Gary saw her 01/01/20 ( note in Epic) and increased eliquis to 5mg  BID  A/P/next steps:  Problem List Items Addressed This Visit      Cardiovascular and Mediastinum   Pulmonary embolism (HCC)    Compliant with eliquis 5mg  BID. I have sent a note to Dr 01/2020 in regards to follow up with him and continuation with this dose.       Relevant Medications   apixaban (ELIQUIS) 5 MG TABS tablet     Other   Anemia    Anticipate improved. Negative occult blood 02/2020. She declines colonoscopy which again I reiterated the importance of today. Pending anemia studies.       Relevant Orders   IBC + Ferritin   Arthralgia - Primary    Chronic. Unchanged. Pursing autoimmune labs and baseline xrays. Difficult to assess over telephone, advised in person evaluation at follow up. Refilled voltaren gel.       Relevant Medications   diclofenac Sodium (VOLTAREN) 1 % GEL    Other Relevant Orders   ANA   C-reactive protein   CYCLIC CITRUL PEPTIDE ANTIBODY, IGG/IGA   Rheumatoid factor   Sedimentation rate   CBC with Differential/Platelet   DG Hand Complete Left   DG Hand Complete Right   DG Knee Complete 4 Views Left   DG Knee Complete 4 Views Right     Declines mammogram, colonscopy  I spent 21 min  discussing plan of care over the phone.

## 2020-05-26 NOTE — Progress Notes (Signed)
Done

## 2020-05-26 NOTE — Patient Instructions (Signed)
Please call  and schedule your 3D mammogram, bone density scan as discussed.   [facility]  [address]  [place], [place]  [phone]  

## 2020-05-27 ENCOUNTER — Telehealth: Payer: Self-pay

## 2020-05-27 ENCOUNTER — Other Ambulatory Visit: Payer: Self-pay

## 2020-05-27 DIAGNOSIS — Z1231 Encounter for screening mammogram for malignant neoplasm of breast: Secondary | ICD-10-CM

## 2020-05-27 DIAGNOSIS — Z1211 Encounter for screening for malignant neoplasm of colon: Secondary | ICD-10-CM

## 2020-05-27 NOTE — Telephone Encounter (Signed)
lvm for pt to schedule 3 month follow up with Claris Che.

## 2020-05-27 NOTE — Telephone Encounter (Signed)
Pt would like an order to be placed so she can get a mammogram. She will also need to get a colonoscopy done before the end of February. I let her know that I was unsure if this could happen by then but would ask. She said she has the financial assistance through [facility] and it runs out at the end of Feb.

## 2020-05-27 NOTE — Telephone Encounter (Signed)
I have called patient & LM that I ordered mammogram, so she could call [facility] to schedule. I provided number as well. Can you place colonoscopy referral?

## 2020-05-28 NOTE — Telephone Encounter (Signed)
Pt called and will let us know if she does not hear in regards to appointment.

## 2020-05-28 NOTE — Addendum Note (Signed)
Addended by: Allegra Grana on: 05/28/2020 09:02 AM   Modules accepted: Orders

## 2020-05-28 NOTE — Telephone Encounter (Signed)
Call pt Ref for colonoscopy has been placed Let us know if you dont hear back within a week in regards to an appointment being scheduled.

## 2020-05-29 ENCOUNTER — Ambulatory Visit (INDEPENDENT_AMBULATORY_CARE_PROVIDER_SITE_OTHER): Payer: Self-pay

## 2020-05-29 ENCOUNTER — Other Ambulatory Visit: Payer: Self-pay

## 2020-05-29 ENCOUNTER — Other Ambulatory Visit (INDEPENDENT_AMBULATORY_CARE_PROVIDER_SITE_OTHER): Payer: Self-pay

## 2020-05-29 DIAGNOSIS — M25541 Pain in joints of right hand: Secondary | ICD-10-CM

## 2020-05-29 DIAGNOSIS — M79642 Pain in left hand: Secondary | ICD-10-CM

## 2020-05-29 DIAGNOSIS — D649 Anemia, unspecified: Secondary | ICD-10-CM

## 2020-05-29 DIAGNOSIS — M25561 Pain in right knee: Secondary | ICD-10-CM

## 2020-05-29 DIAGNOSIS — M25562 Pain in left knee: Secondary | ICD-10-CM

## 2020-05-29 NOTE — Addendum Note (Signed)
Addended by: Warden Fillers on: 05/29/2020 03:10 PM   Modules accepted: Orders

## 2020-05-29 NOTE — Addendum Note (Signed)
Addended by: Bonnell Public I on: 05/29/2020 02:58 PM   Modules accepted: Orders

## 2020-05-30 ENCOUNTER — Encounter: Payer: Self-pay | Admitting: Family

## 2020-05-30 LAB — CBC WITH DIFFERENTIAL/PLATELET
Basophils Absolute: 0 10*3/uL (ref 0.0–0.1)
Basophils Relative: 0.6 % (ref 0.0–3.0)
Eosinophils Absolute: 0.1 10*3/uL (ref 0.0–0.7)
Eosinophils Relative: 1.2 % (ref 0.0–5.0)
HCT: 38 % (ref 36.0–46.0)
Hemoglobin: 12.2 g/dL (ref 12.0–15.0)
Lymphocytes Relative: 49.5 % — ABNORMAL HIGH (ref 12.0–46.0)
Lymphs Abs: 3.4 10*3/uL (ref 0.7–4.0)
MCHC: 32.1 g/dL (ref 30.0–36.0)
MCV: 83.4 fl (ref 78.0–100.0)
Monocytes Absolute: 0.4 10*3/uL (ref 0.1–1.0)
Monocytes Relative: 6.1 % (ref 3.0–12.0)
Neutro Abs: 3 10*3/uL (ref 1.4–7.7)
Neutrophils Relative %: 42.6 % — ABNORMAL LOW (ref 43.0–77.0)
Platelets: 227 10*3/uL (ref 150.0–400.0)
RBC: 4.56 Mil/uL (ref 3.87–5.11)
RDW: 16.8 % — ABNORMAL HIGH (ref 11.5–15.5)
WBC: 6.9 10*3/uL (ref 4.0–10.5)

## 2020-05-30 LAB — IBC + FERRITIN
Ferritin: 69.4 ng/mL (ref 10.0–291.0)
Iron: 61 ug/dL (ref 42–145)
Saturation Ratios: 17.2 % — ABNORMAL LOW (ref 20.0–50.0)
Transferrin: 253 mg/dL (ref 212.0–360.0)

## 2020-05-30 LAB — SEDIMENTATION RATE: Sed Rate: 10 mm/hr (ref 0–30)

## 2020-05-30 LAB — C-REACTIVE PROTEIN: CRP: <1 mg/dL (ref 0.5–20.0)

## 2020-05-30 LAB — RHEUMATOID FACTOR: Rheumatoid fact SerPl-aCnc: <14 IU/mL (ref <14)

## 2020-05-30 LAB — ANA: Anti Nuclear Antibody (ANA): NEGATIVE

## 2020-05-31 LAB — CYCLIC CITRUL PEPTIDE ANTIBODY, IGG/IGA: Cyclic Citrullin Peptide Ab: 6 units (ref 0–19)

## 2020-06-02 ENCOUNTER — Telehealth: Payer: Self-pay | Admitting: *Deleted

## 2020-06-02 ENCOUNTER — Encounter: Payer: Self-pay | Admitting: Family

## 2020-06-02 NOTE — Telephone Encounter (Signed)
Left message to return call  To office concerning imaging results.

## 2020-06-02 NOTE — Telephone Encounter (Signed)
Patient was returning call for results to be explain to her

## 2020-06-02 NOTE — Telephone Encounter (Signed)
Patient would like to go ahead with Cymbalta or pain medication, tried to call and explain results patient keeps hanging the phone up might be better to schedule Follow up appointment to explain.

## 2020-06-02 NOTE — Telephone Encounter (Signed)
See phone and result notes left message for patient to call office.

## 2020-06-03 ENCOUNTER — Other Ambulatory Visit: Payer: Self-pay | Admitting: Family

## 2020-06-04 ENCOUNTER — Other Ambulatory Visit: Payer: Self-pay | Admitting: Family

## 2020-06-04 ENCOUNTER — Telehealth: Payer: Self-pay | Admitting: Family

## 2020-06-04 DIAGNOSIS — M25541 Pain in joints of right hand: Secondary | ICD-10-CM

## 2020-06-04 MED ORDER — DULOXETINE HCL 30 MG PO CPEP: ORAL_CAPSULE | ORAL | 3 refills | Status: DC

## 2020-06-04 MED ORDER — DULOXETINE HCL 30 MG PO CPEP
ORAL_CAPSULE | ORAL | 3 refills | Status: DC
Start: 2020-06-04 — End: 2020-06-04

## 2020-06-04 NOTE — Addendum Note (Signed)
Addended by: Allegra Grana on: 06/04/2020 02:01 PM   Modules accepted: Orders

## 2020-06-04 NOTE — Telephone Encounter (Signed)
Patient called for refill for gabapentin (NEURONTIN) 100 MG capsule and DULoxetine (CYMBALTA) 30 MG capsule

## 2020-06-04 NOTE — Addendum Note (Signed)
Addended byElise Benne T on: 06/04/2020 03:14 PM   Modules accepted: Orders

## 2020-06-04 NOTE — Telephone Encounter (Signed)
Patient called into office still very confused per patient on her results and medications for pain in her knee advised patient of Cymbalta and gabapentin as prescribed and also explained Imaging results for hands and Knee's. Patient voiced understanding but would like a referral to orthopaedics.

## 2020-06-04 NOTE — Telephone Encounter (Signed)
Ref to orthopedics cymbalta sent

## 2020-06-05 ENCOUNTER — Telehealth (INDEPENDENT_AMBULATORY_CARE_PROVIDER_SITE_OTHER): Payer: Self-pay | Admitting: Gastroenterology

## 2020-06-05 ENCOUNTER — Other Ambulatory Visit: Payer: Self-pay

## 2020-06-05 DIAGNOSIS — Z1211 Encounter for screening for malignant neoplasm of colon: Secondary | ICD-10-CM

## 2020-06-05 NOTE — Progress Notes (Signed)
Gastroenterology Pre-Procedure Review  Request Date: 07/04/20 Requesting Physician: Dr. Carlene Coria Sample provided.  PATIENT REVIEW QUESTIONS: The patient responded to the following health history questions as indicated:    1. Are you having any GI issues? no 2. Do you have a personal history of Polyps? no 3. Do you have a family history of Colon Cancer or Polyps? no 4. Diabetes Mellitus? no 5. Joint replacements in the past 12 months?no 6. Major health problems in the past 3 months?no 7. Any artificial heart valves, MVP, or defibrillator?no    MEDICATIONS & ALLERGIES:    Patient reports the following regarding taking any anticoagulation/antiplatelet therapy:   Plavix, Coumadin, Eliquis, Xarelto, Lovenox, Pradaxa, Brilinta, or Effient? yes (Eliquis blood thinner request sent to Dr. Jason Coop) Aspirin? no  Patient confirms/reports the following medications:  Current Outpatient Medications  Medication Sig Dispense Refill  . apixaban (ELIQUIS) 5 MG TABS tablet Take 5 mg by mouth 2 (two) times daily.    . diclofenac Sodium (VOLTAREN) 1 % GEL Apply 4 g topically 4 (four) times daily. 50 g 3  . DULoxetine (CYMBALTA) 30 MG capsule Take one 30 mg tablet by mouth once a day for the first week. Then increase to two 30 mg tablets ( total 60mg ) by mouth once daily. 60 capsule 3  . gabapentin (NEURONTIN) 100 MG capsule TAKE ONE CAPSULE BY MOUTH 3 TIMES A DAY 90 capsule 0  . loratadine (CLARITIN) 10 MG tablet Take 10 mg by mouth daily.     No current facility-administered medications for this visit.    Patient confirms/reports the following allergies:  Allergies  Allergen Reactions  . Sulfa Antibiotics Swelling and Rash  . Sulfa Antibiotics Rash    No orders of the defined types were placed in this encounter.   AUTHORIZATION INFORMATION Primary Insurance: 1D#: Group #:  Secondary Insurance: 1D#: Group #:  SCHEDULE INFORMATION: Date: 07/04/20 Time: Location:[facility]

## 2020-06-09 ENCOUNTER — Telehealth: Payer: Self-pay | Admitting: Pharmacist

## 2020-06-09 NOTE — Telephone Encounter (Signed)
06/09/2020 12:28:18 PM - Cymbalta/Lilly forms to patient & dr  -- Rhetta Mura - Monday, June 09, 2020 12:26 PM --Received pharmacy printout for Cymbalta 30mg  Take 1 capsule by mouth once a day for 1 week, then increase to 2 capsules by mouth every day. Printed [employer] application--mailing patient her portion and provider @ Dr to sign & return.

## 2020-06-10 ENCOUNTER — Telehealth: Payer: Self-pay | Admitting: Family

## 2020-06-10 DIAGNOSIS — I2699 Other pulmonary embolism without acute cor pulmonale: Secondary | ICD-10-CM

## 2020-06-10 NOTE — Telephone Encounter (Signed)
FYI  Dr Maximino Greenland, patient will need cardiac clearance re: eliquis prior to colonoscopy scheduled for 07/04/20. I hope date is not interrupted however she was supposed to follow up with cardiology after hospitalization 12/2019 and declined at that time.  I will work on arranging cardiac consult  now.    Call pt I rec'ed note in regards to holding eliquis prior to colonoscopy in March.  Due to her history, she would need to have cardiology consult prior to holding eliquis so they can determine how/when she will hold eliquis.  Advise I have placed referral and will not return form for eliquis until she has seen cardiology Let us know if you dont hear back within a week in regards to an appointment being scheduled.

## 2020-06-11 ENCOUNTER — Telehealth: Payer: Self-pay

## 2020-06-11 ENCOUNTER — Telehealth: Payer: Self-pay | Admitting: Family

## 2020-06-11 NOTE — Telephone Encounter (Signed)
Patient has been contacted to advise that her PCP Rennie Plowman has advised that she will need a cardiac referral for evaluation .  She has a follow up appt scheduled with Dr. Yehuda Savannah on 08/27/20. Cardiac referral is being handled by her PCP.  I advised patient that I would like to reschedule her colonoscopy until after she has had her cardiac evaluation and Dr. Yehuda Savannah is able to advise when she can hold her eliquis.  Dr. Yehuda Savannah will advise on Eliquis once she has been evaluated by cardiology.  Patient is scheduled for 07/04/20 and does not want to reschedule her procedure at this time.  I explained to her that if we do not get clearance to hold Eliquis prior to procedure date, we will need to reschedule for a later date.  She said she will contact her PCP to discuss.  Thanks,  [place], [place]

## 2020-06-11 NOTE — Telephone Encounter (Signed)
noted 

## 2020-06-11 NOTE — Telephone Encounter (Signed)
Rejection Reason - Patient Declined" Braulio Conte said on Jun 11, 2020 2:48 PM  "Spoke with patient, patient declined scheduling at this time, stated they will call us back if they decide to get something scheduled " Braulio Conte said on Jun 11, 2020 2:48 PM  [facility] orthopedic surgery

## 2020-06-12 ENCOUNTER — Telehealth: Payer: Self-pay | Admitting: Family

## 2020-06-12 NOTE — Telephone Encounter (Signed)
Rejection Reason - Not taking new patients - Dr. Lady Gary is no longer taking New Patients and Ms. Purdie has [facility] thru [facility] and she would have to apply for charity care with [facility] and wait for that to process and that can take several weeks for that process so since she is approved already with [facility] may be a better choice for her to be seen for Cardiology needs." Remus Loffler said on Jun 11, 2020 4:57 PM  [facility] cardiology

## 2020-06-16 NOTE — Telephone Encounter (Signed)
I called and spoke with the patient and informed her that she should receive a call from cardiology about an appointment and she understood.  Brentton Wardlow,cma

## 2020-06-16 NOTE — Telephone Encounter (Signed)
I called LVM for the patient to call her Cardiologist and schedule a appointment with them about the eliquis I left a detailed message.  Kyzen Horn,cma

## 2020-06-18 ENCOUNTER — Encounter: Payer: Self-pay | Admitting: Family

## 2020-06-20 ENCOUNTER — Other Ambulatory Visit: Payer: Self-pay | Admitting: Family

## 2020-06-20 ENCOUNTER — Telehealth: Payer: Self-pay | Admitting: Pharmacist

## 2020-06-20 DIAGNOSIS — I249 Acute ischemic heart disease, unspecified: Secondary | ICD-10-CM

## 2020-06-20 DIAGNOSIS — I2699 Other pulmonary embolism without acute cor pulmonale: Secondary | ICD-10-CM

## 2020-06-20 MED ORDER — ENOXAPARIN SODIUM 100 MG/ML ~~LOC~~ SOLN: 100.0000 mg | Freq: Two times a day (BID) | SUBCUTANEOUS | 0 refills | Status: DC

## 2020-06-20 NOTE — Telephone Encounter (Signed)
Thank you and your so very welcome!

## 2020-06-20 NOTE — Telephone Encounter (Signed)
Good afternoon!  Sure if you send New referral being that note would be connected. Thank you!

## 2020-06-20 NOTE — Telephone Encounter (Signed)
06/20/2020 10:01:18 AM - Cymbalta pending  -- Leah Roberts - Friday, June 20, 2020 10:00 AM --I have received the signed patient portion of [employer] application for Cymbalta--holding for provider to return their portion-mailed to provider 06/09/2020.

## 2020-06-20 NOTE — Telephone Encounter (Signed)
New ref placed :)   Thank you!

## 2020-06-20 NOTE — Telephone Encounter (Signed)
Kellie Moor, can pt go to [facility] charity care [facility] ?  Can you change referral?

## 2020-06-20 NOTE — Progress Notes (Signed)
Called and spoke with pt regarding preparing for colonoscopy I have sent lovenox which she will bring with her to discuss at our appt 06/23/20.  SHE WILL NOT Start medication until we discuss all including cardiology evaluation that she never had.

## 2020-06-23 ENCOUNTER — Other Ambulatory Visit: Payer: Self-pay

## 2020-06-23 ENCOUNTER — Ambulatory Visit (INDEPENDENT_AMBULATORY_CARE_PROVIDER_SITE_OTHER): Payer: No Typology Code available for payment source | Admitting: Family

## 2020-06-23 ENCOUNTER — Encounter: Payer: Self-pay | Admitting: Family

## 2020-06-23 VITALS — BP 122/84 | HR 81 | Temp 98.0°F | Ht 65.98 in | Wt 226.6 lb

## 2020-06-23 DIAGNOSIS — M25541 Pain in joints of right hand: Secondary | ICD-10-CM

## 2020-06-23 DIAGNOSIS — I2699 Other pulmonary embolism without acute cor pulmonale: Secondary | ICD-10-CM

## 2020-06-23 DIAGNOSIS — R079 Chest pain, unspecified: Secondary | ICD-10-CM

## 2020-06-23 DIAGNOSIS — Z1211 Encounter for screening for malignant neoplasm of colon: Secondary | ICD-10-CM

## 2020-06-23 NOTE — Assessment & Plan Note (Signed)
Knee and hand pain improved. Continue cymbalta 30mg  BID.

## 2020-06-23 NOTE — Progress Notes (Signed)
Subjective:    Patient ID: Leah Roberts, female    DOB: Jan 13, 1951, 70 y.o.   MRN: [id number]  CC: Leah Roberts is a 70 y.o. female who presents today for follow up.   HPI: Here today to discuss lovenox injection bridge ahead of colonoscopy which is scheduled 07/04/20.   She doesn't want to delay colonoscopy as she has Financial Assistance of 100% until 08/05/20.   She expresses interest in cologuard.  No change in her bowl.No blood in stool.    She has h/o  Left DVT 2013 after 16 hour flight, treated at that time in [place] and then came off anticoagulation. She then had PE a year later.   She is not OCP. Non smoker. Sister has clotting disorder. She was never tested for bleeding disorder.   She is compliant with eliquis 5mg  BID  She has no recurrence of CP since her hospitalization 12/2019. She is very busy with housework. 'I do not sit.' Takes care of nieces and nephews. No leg swelling, sob.   No inducible ischemia during stress test, normal ejection fraction.  Troponin elevated during hospitalization from 29 to 19.   Referral to cardiology;she is agreeable if done by 08/05/20   Bilateral knee pain, worse in left knee. Pain has improved on cymbalta 30mg  BID for 2 weeks. Fingers of bilateral hands were also getting stiff and since resolved.  No joint swelling or fever.    Anemia resolved.  No family history colon cancer.    HISTORY:  Past Medical History:  Diagnosis Date  . DVT (deep venous thrombosis) (HCC)   . DVT (deep venous thrombosis) (HCC) 2013  . History of hypertension   . Hypertension   . Pulmonary embolism (HCC) 2013   Past Surgical History:  Procedure Laterality Date  . APPENDECTOMY    . TONSILLECTOMY AND ADENOIDECTOMY     Family History  Problem Relation Age of Onset  . Hypertension Father   . Heart disease Father 58  . Thyroid disease Sister   . Allergies Sister   . CVA Neg Hx   . Heart attack Neg Hx   . High Cholesterol Neg Hx   .  Colon cancer Neg Hx     Allergies: Sulfa antibiotics and Sulfa antibiotics Current Outpatient Medications on File Prior to Visit  Medication Sig Dispense Refill  . apixaban (ELIQUIS) 5 MG TABS tablet Take 5 mg by mouth 2 (two) times daily.    . diclofenac Sodium (VOLTAREN) 1 % GEL Apply 4 g topically 4 (four) times daily. 50 g 3  . DULoxetine (CYMBALTA) 30 MG capsule Take one 30 mg tablet by mouth once a day for the first week. Then increase to two 30 mg tablets ( total 60mg ) by mouth once daily. 60 capsule 3  . gabapentin (NEURONTIN) 100 MG capsule TAKE ONE CAPSULE BY MOUTH 3 TIMES A DAY 90 capsule 0  . loratadine (CLARITIN) 10 MG tablet Take 10 mg by mouth daily.     No current facility-administered medications on file prior to visit.    Social History   Tobacco Use  . Smoking status: Never Smoker  . Smokeless tobacco: Never Used  Substance Use Topics  . Alcohol use: No  . Drug use: No    Review of Systems  Constitutional: Negative for chills and fever.  Respiratory: Negative for cough and shortness of breath.   Cardiovascular: Negative for chest pain, palpitations and leg swelling.  Gastrointestinal: Negative for nausea and vomiting.  Musculoskeletal: Positive  for arthralgias (improved).      Objective:    BP 122/84 (BP Location: Left Arm, Patient Position: Sitting)   Pulse 81   Temp 98 F (36.7 C)   Ht 5' 5.98" (1.676 m)   Wt 226 lb 9.6 oz (102.8 kg)   SpO2 97%   BMI 36.59 kg/m  BP Readings from Last 3 Encounters:  06/23/20 122/84  05/26/20 122/80  01/09/20 122/70   Wt Readings from Last 3 Encounters:  06/23/20 226 lb 9.6 oz (102.8 kg)  05/26/20 221 lb (100.2 kg)  01/09/20 222 lb 9.6 oz (101 kg)    Physical Exam Vitals reviewed.  Constitutional:      Appearance: She is well-developed and well-nourished.  Eyes:     Conjunctiva/sclera: Conjunctivae normal.  Cardiovascular:     Rate and Rhythm: Normal rate and regular rhythm.     Pulses: Normal pulses.      Heart sounds: Normal heart sounds.  Pulmonary:     Effort: Pulmonary effort is normal.     Breath sounds: Normal breath sounds. No wheezing, rhonchi or rales.  Musculoskeletal:     Right lower leg: No edema.     Left lower leg: No edema.  Skin:    General: Skin is warm and dry.  Neurological:     Mental Status: She is alert.  Psychiatric:        Mood and Affect: Mood and affect normal.        Speech: Speech normal.        Behavior: Behavior normal.        Thought Content: Thought content normal.        Assessment & Plan:   Problem List Items Addressed This Visit      Cardiovascular and Mediastinum   Pulmonary embolism (HCC)     Other   Arthralgia    Knee and hand pain improved. Continue cymbalta 30mg  BID.       Chest pain    No recurrence however patient has financial assistance and we agreed that we now she can finally have cardiac consult after hospitalization.       Screening for colon cancer    Patient has no known colon cancer in family. No changes in bowel patterns, blood seen in stool. We had a long discussion in regards to colonoscopy and Cologuard. Patient felt most comfortable with pursing Cologuard and understands that if positive, she would require colonoscopy. She will not start lovenox. I have notified Dr . Cologuard ordered and co signed by CMA Azzel today.       Other Visit Diagnoses    Screen for colon cancer    -  Primary   Relevant Orders   Cologuard       I have discontinued Maeby Mcconaughy's enoxaparin. I am also having her maintain her loratadine, apixaban, diclofenac Sodium, gabapentin, and DULoxetine.   No orders of the defined types were placed in this encounter.   Return precautions given.   Risks, benefits, and alternatives of the medications and treatment plan prescribed today were discussed, and patient expressed understanding.   Education regarding symptom management and diagnosis given to patient on  AVS.  Continue to follow with Maximino Greenland, FNP for routine health maintenance.   Renatta Allegra Grana and I agreed with plan.   Oralia Rud, FNP

## 2020-06-23 NOTE — Assessment & Plan Note (Signed)
No recurrence however patient has financial assistance and we agreed that we now she can finally have cardiac consult after hospitalization.

## 2020-06-23 NOTE — Telephone Encounter (Signed)
Dr Sharyon Medicus and Marcelino Duster,  Patient has decided to pursue Cologuard; we had a long discussion regarding this today.   Please cancel screening colonoscopy  Claris Che

## 2020-06-23 NOTE — Patient Instructions (Addendum)
Referral to cardiology  Let us know if you dont hear back within a week in regards to an appointment being scheduled.    Cologuard ordered; please let me know if you do not receive in 2 weeks.

## 2020-06-23 NOTE — Assessment & Plan Note (Signed)
Patient has no known colon cancer in family. No changes in bowel patterns, blood seen in stool. We had a long discussion in regards to colonoscopy and Cologuard. Patient felt most comfortable with pursing Cologuard and understands that if positive, she would require colonoscopy. She will not start lovenox. I have notified Dr Maximino Greenland. Cologuard ordered and co signed by CMA Azzel today.

## 2020-06-24 ENCOUNTER — Other Ambulatory Visit: Payer: Self-pay | Admitting: Family

## 2020-06-24 ENCOUNTER — Ambulatory Visit
Admission: RE | Admit: 2020-06-24 | Discharge: 2020-06-24 | Disposition: A | Discharge status: Home / Self Care | Payer: No Typology Code available for payment source | Source: Ambulatory Visit | Attending: Family | Admitting: Family

## 2020-06-24 DIAGNOSIS — Z1231 Encounter for screening mammogram for malignant neoplasm of breast: Secondary | ICD-10-CM | POA: Insufficient documentation

## 2020-06-30 ENCOUNTER — Telehealth: Payer: Self-pay

## 2020-06-30 DIAGNOSIS — G8929 Other chronic pain: Secondary | ICD-10-CM

## 2020-06-30 NOTE — Telephone Encounter (Signed)
Pt called and advised that referral within [facility] was placed. She will let us know if she does not hear in regards to an appointment.

## 2020-06-30 NOTE — Telephone Encounter (Signed)
Call Pt ref to [facility]opedics [facility] Let us know if you dont hear back within a week in regards to an appointment being scheduled.

## 2020-06-30 NOTE — Telephone Encounter (Signed)
Pt states that needs an orthopedic surgeon within [facility] because she already has been approved for assistance through [facility]. [facility] will not do anything for her since she is uninsured-she would have to apply for their assistance and no telling how long that would take. Please find in network physician for referral for left wrist and knee and call the pt back.

## 2020-07-01 ENCOUNTER — Telehealth: Payer: Self-pay | Admitting: Pharmacist

## 2020-07-01 NOTE — Telephone Encounter (Signed)
07/01/2020 9:41:03 AM - Julious Oka application for Cymbalta faxed  -- Rhetta Mura - Tuesday, July 01, 2020 9:39 AM --Daylene Posey Application for Cymbalta 30mg  Take 2 capsules daily.  CHECKED FOR AUTO REFILL FOR CYMBALTA

## 2020-07-04 ENCOUNTER — Encounter: Admission: RE | Payer: Self-pay | Source: Home / Self Care

## 2020-07-04 ENCOUNTER — Ambulatory Visit: Admission: RE | Admit: 2020-07-04 | Payer: Self-pay | Source: Home / Self Care | Admitting: Gastroenterology

## 2020-07-04 SURGERY — COLONOSCOPY WITH PROPOFOL
Anesthesia: General

## 2020-07-09 ENCOUNTER — Other Ambulatory Visit: Payer: Self-pay

## 2020-07-09 DIAGNOSIS — Z1211 Encounter for screening for malignant neoplasm of colon: Secondary | ICD-10-CM

## 2020-07-16 ENCOUNTER — Ambulatory Visit: Payer: Self-pay

## 2020-07-16 ENCOUNTER — Encounter: Payer: Self-pay | Admitting: Physician Assistant

## 2020-07-16 ENCOUNTER — Ambulatory Visit (INDEPENDENT_AMBULATORY_CARE_PROVIDER_SITE_OTHER): Payer: Self-pay

## 2020-07-16 ENCOUNTER — Ambulatory Visit (INDEPENDENT_AMBULATORY_CARE_PROVIDER_SITE_OTHER): Payer: No Typology Code available for payment source | Admitting: Physician Assistant

## 2020-07-16 ENCOUNTER — Other Ambulatory Visit: Payer: Self-pay

## 2020-07-16 DIAGNOSIS — M25561 Pain in right knee: Secondary | ICD-10-CM

## 2020-07-16 DIAGNOSIS — M25562 Pain in left knee: Secondary | ICD-10-CM

## 2020-07-16 DIAGNOSIS — M25532 Pain in left wrist: Secondary | ICD-10-CM

## 2020-07-16 MED ORDER — LIDOCAINE HCL 1 % IJ SOLN
5.0000 mL | INTRAMUSCULAR | Status: AC | PRN
  Administered 2020-07-16: 5 mL

## 2020-07-16 MED ORDER — METHYLPREDNISOLONE ACETATE 40 MG/ML IJ SUSP
40.0000 mg | INTRAMUSCULAR | Status: AC | PRN
  Administered 2020-07-16: 40 mg via INTRA_ARTICULAR

## 2020-07-16 NOTE — Progress Notes (Signed)
Office Visit Note   Patient: Leah Roberts           Date of Birth: June 12, 1950           MRN: [id number] Visit Date: 07/16/2020              Requested by: Allegra Grana, FNP [address] [place],  [place] [address] PCP: Allegra Grana, FNP  No chief complaint on file.     HPI: Patient is a pleasant 70 year old woman with a chief complaint of bilateral knee pain and left wrist pain.  She has been evaluated elsewhere and has been started on Cymbalta and gabapentin which has been not very helpful to her.  She also states she occasionally has radicular type pain that radiates up and down her right leg.  This is not her biggest concern.  Her biggest concern is of bilateral knee pain over the knee joint itself.  She also complains of pain in the left wrist at the base of the thumb.  Again she denies any injury.  She says this has been going on for about 6 months.  Treatment for all these she has tried some topical Voltaren gel on the knees with some help.  She cannot take anti-inflammatories because she has been told not to because she is currently on Eliquis.  Also instructed her in close chain exercises.  Her arthritis is fairly advanced and I did tell her that at some point she may need knee replacements  Assessment & Plan: Visit Diagnoses: No diagnosis found.  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  She underwent bilateral steroid injections today.  She will follow-up in 3 weeks to reevaluate the results.  She did not complain of significant radicular symptoms today.  Given her x-rays and her examination I think findings are more consistent with arthritis in her knees. Left wrist de Quervain's tenosynovitis.  Underwent a steroid injection into this area as well explained the natural history of this to her and gave her information.  Will reevaluate in 3 weeks  Follow-Up Instructions: No follow-ups on file.   Ortho Exam  Patient is alert, oriented, no  adenopathy, well-dressed, normal affect, normal respiratory effort. Bilateral knees: No effusion no erythema no cellulitis bilaterally she has grinding with range of motion she is tender over the medial joint lines more than the lateral joint lines.  Compartments are soft and nontender Left wrist no swelling no abnormality she does have tenderness over the APL and EPB.  Positive Finkelstein's test. We will go forward with injections today. After obtaining verbal consent her left wrist was sterilely prepped and 1 cc of plain lidocaine and 1 cc of Depo-Medrol were infiltrated around the tendons.  She tolerated this well felt she had a little bit of relief Imaging: No results found. No images are attached to the encounter.  Labs: Lab Results  Component Value Date   HGBA1C 6.2 02/15/2018   HGBA1C 6.2 10/05/2017   HGBA1C 6.3 06/18/2016   ESRSEDRATE 10 05/29/2020   CRP <1.0 05/29/2020     Lab Results  Component Value Date   ALBUMIN 3.9 06/18/2016    No results found for: MG Lab Results  Component Value Date   VD25OH 21.43 (L) 10/05/2017    No results found for: PREALBUMIN CBC EXTENDED Latest Ref Rng & Units 05/29/2020 01/01/2020 12/31/2019  WBC 4.0 - 10.5 K/uL 6.9 7.6 6.2  RBC 3.87 - 5.11 Mil/uL 4.56 4.05 4.43  HGB 12.0 - 15.0  g/dL 03.8 10.7(L) 11.9(L)  HCT 36.0 - 46.0 % 38.0 32.9(L) 37.0  PLT 150.0 - 400.0 K/uL 227.0 222 226  NEUTROABS 1.4 - 7.7 K/uL 3.0 - -  LYMPHSABS 0.7 - 4.0 K/uL 3.4 - -     There is no height or weight on file to calculate BMI.  Orders:  No orders of the defined types were placed in this encounter.  No orders of the defined types were placed in this encounter.    Procedures: Large Joint Inj: bilateral knee on 07/16/2020 5:05 PM Indications: pain and diagnostic evaluation Details: 22 G 1.5 in needle, anteromedial approach  Arthrogram: No  Medications (Right): 5 mL lidocaine 1 %; 40 mg methylPREDNISolone acetate 40 MG/ML Medications (Left): 5 mL  lidocaine 1 %; 40 mg methylPREDNISolone acetate 40 MG/ML Outcome: tolerated well, no immediate complications Procedure, treatment alternatives, risks and benefits explained, specific risks discussed. Consent was given by the patient. Immediately prior to procedure a time out was called to verify the correct patient, procedure, equipment, support staff and site/side marked as required. Patient was prepped and draped in the usual sterile fashion.      Clinical Data: No additional findings.  ROS:  All other systems negative, except as noted in the HPI. Review of Systems  Objective: Vital Signs: There were no vitals taken for this visit.  Specialty Comments:  No specialty comments available.  PMFS History: Patient Active Problem List   Diagnosis Date Noted  . Anemia 01/09/2020  . HTN (hypertension) 12/31/2019  . Chest pain 12/31/2019  . DOE (dyspnea on exertion) 12/31/2019  . Acute coronary syndrome (HCC) 12/31/2019  . Unstable angina (HCC) 12/31/2019  . Atherosclerosis 08/23/2018  . Screening for tuberculosis 07/12/2018  . 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 Arthralgia 07/12/2018  . Solitary pulmonary nodule 07/12/2018  . Neck pain 02/15/2018  . Dizziness 02/15/2018  . Elevated TSH 02/15/2018  . Screening for HIV (human immunodeficiency virus) 10/05/2017  . Neuropathy 10/05/2017  . Screening for colon cancer 10/05/2017  . Hyperlipidemia 08/04/2016  . Prediabetes 08/04/2016  . Back pain 08/04/2016  . Left knee pain 06/18/2016  . History of DVT of lower extremity 04/01/2014  . Pulmonary embolism (HCC) 05/04/2011   Past Medical History:  Diagnosis Date  . DVT (deep venous thrombosis) (HCC)   . DVT (deep venous thrombosis) (HCC) 2013  . History of hypertension   . Hypertension   . Pulmonary embolism (HCC) 2013    Family History  Problem Relation Age of Onset  . Hypertension Father   . Heart disease Father 64  . Thyroid disease Sister   . Allergies Sister   . CVA Neg Hx    . Heart attack Neg Hx   . High Cholesterol Neg Hx   . Colon cancer Neg Hx     Past Surgical History:  Procedure Laterality Date  . APPENDECTOMY    . TONSILLECTOMY AND ADENOIDECTOMY     Social History   Occupational History  . Not on file  Tobacco Use  . Smoking status: Never Smoker  . Smokeless tobacco: Never Used  Substance and Sexual Activity  . Alcohol use: No  . Drug use: No  . Sexual activity: Not on file

## 2020-07-18 LAB — COLOGUARD: Cologuard: NEGATIVE

## 2020-07-22 ENCOUNTER — Ambulatory Visit: Payer: Self-pay | Admitting: Family

## 2020-07-23 LAB — COLOGUARD: COLOGUARD: NEGATIVE

## 2020-07-25 ENCOUNTER — Telehealth: Payer: Self-pay | Admitting: Family

## 2020-07-25 ENCOUNTER — Encounter: Payer: Self-pay | Admitting: Family

## 2020-07-25 NOTE — Telephone Encounter (Signed)
Call pt     let  know Cologuard NEGATIVE which indicates lower likelihood of cancer or pre cancer. You will need to repeat test in 3 years or sooner if any symptoms including blood in stool, change in bowel habits.   

## 2020-07-25 NOTE — Telephone Encounter (Signed)
Called and spoke to Leah Roberts. Leah Roberts verbalized understanding and had no further questions.

## 2020-08-07 ENCOUNTER — Ambulatory Visit (INDEPENDENT_AMBULATORY_CARE_PROVIDER_SITE_OTHER): Payer: Self-pay | Admitting: Physician Assistant

## 2020-08-07 ENCOUNTER — Other Ambulatory Visit: Payer: Self-pay

## 2020-08-07 ENCOUNTER — Encounter: Payer: Self-pay | Admitting: Physician Assistant

## 2020-08-07 VITALS — Ht 65.0 in | Wt 226.0 lb

## 2020-08-07 DIAGNOSIS — M25561 Pain in right knee: Secondary | ICD-10-CM

## 2020-08-07 MED ORDER — PREDNISONE 10 MG PO TABS
10.0000 mg | ORAL_TABLET | Freq: Every day | ORAL | 0 refills | Status: DC
  Filled 2020-08-07: qty 30, 30d supply, fill #0

## 2020-08-07 NOTE — Progress Notes (Signed)
Office Visit Note   Patient: Leah Roberts           Date of Birth: [birth year]           MRN: [id number] Visit Date: 08/07/2020              Requested by: Allegra Grana, FNP [address] [place],  [place] [address] PCP: Allegra Grana, FNP  Chief Complaint  Patient presents with  . Right Knee - Follow-up  . Left Knee - Follow-up  . Left Wrist - Follow-up      HPI: Patient is a pleasant 70 year old woman who is here for follow-up for bilateral knee injections and a left wrist de Quervain's injection.  She said her wrist is much much better.  She also feels her knees are better.  She has been working on some strengthening. Assessment & Plan: Visit Diagnoses: No diagnosis found.  Plan: Patient does mention as she is leaving that she sometimes get numbness in her knees we will try her on a short course of prednisone will follow up with me in 3 weeks Should get x-rays of her back at her left next visit if she is not improved Follow-Up Instructions: No follow-ups on file.   Ortho Exam  Patient is alert, oriented, no adenopathy, well-dressed, normal affect, normal respiratory effort. Wrist she has no tenderness with flexion extension significantly improved from previous exam before the injection.  No tenderness with abduction or abduction of the thumb. Bilateral knees no specific tenderness on the joint line good extension and flexion still has some quadricep atrophy.  Good plantar and dorsiflexion of the ankles and feet  Imaging: No results found. No images are attached to the encounter.  Labs: Lab Results  Component Value Date   HGBA1C 6.2 02/15/2018   HGBA1C 6.2 10/05/2017   HGBA1C 6.3 06/18/2016   ESRSEDRATE 10 05/29/2020   CRP <1.0 05/29/2020     Lab Results  Component Value Date   ALBUMIN 3.9 06/18/2016    No results found for: MG Lab Results  Component Value Date   VD25OH 21.43 (L) 10/05/2017    No results found for:  PREALBUMIN CBC EXTENDED Latest Ref Rng & Units 05/29/2020 01/01/2020 12/31/2019  WBC 4.0 - 10.5 K/uL 6.9 7.6 6.2  RBC 3.87 - 5.11 Mil/uL 4.56 4.05 4.43  HGB 12.0 - 15.0 g/dL 23.5 10.7(L) 11.9(L)  HCT 36.0 - 46.0 % 38.0 32.9(L) 37.0  PLT 150.0 - 400.0 K/uL 227.0 222 226  NEUTROABS 1.4 - 7.7 K/uL 3.0 - -  LYMPHSABS 0.7 - 4.0 K/uL 3.4 - -     Body mass index is 37.61 kg/m.  Orders:  No orders of the defined types were placed in this encounter.  Meds ordered this encounter  Medications  . predniSONE (DELTASONE) 10 MG tablet    Sig: Take 1 tablet (10 mg total) by mouth daily with breakfast.    Dispense:  30 tablet    Refill:  0     Procedures: No procedures performed  Clinical Data: No additional findings.  ROS:  All other systems negative, except as noted in the HPI. Review of Systems  Objective: Vital Signs: Ht 5\' 5"  (1.651 m)   Wt 226 lb (102.5 kg)   BMI 37.61 kg/m   Specialty Comments:  No specialty comments available.  PMFS History: Patient Active Problem List   Diagnosis Date Noted  . Anemia 01/09/2020  . HTN (hypertension) 12/31/2019  . Chest pain 12/31/2019  .  DOE (dyspnea on exertion) 12/31/2019  . Acute coronary syndrome (HCC) 12/31/2019  . Unstable angina (HCC) 12/31/2019  . Atherosclerosis 08/23/2018  . Screening for tuberculosis 07/12/2018  . 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 Arthralgia 07/12/2018  . Solitary pulmonary nodule 07/12/2018  . Neck pain 02/15/2018  . Dizziness 02/15/2018  . Elevated TSH 02/15/2018  . Screening for HIV (human immunodeficiency virus) 10/05/2017  . Neuropathy 10/05/2017  . Screening for colon cancer 10/05/2017  . Hyperlipidemia 08/04/2016  . Prediabetes 08/04/2016  . Back pain 08/04/2016  . Left knee pain 06/18/2016  . History of DVT of lower extremity 04/01/2014  . Pulmonary embolism (HCC) 05/04/2011   Past Medical History:  Diagnosis Date  . DVT (deep venous thrombosis) (HCC)   . DVT (deep venous thrombosis) (HCC)  2013  . History of hypertension   . Hypertension   . Pulmonary embolism (HCC) 2013    Family History  Problem Relation Age of Onset  . Hypertension Father   . Heart disease Father 80  . Thyroid disease Sister   . Allergies Sister   . CVA Neg Hx   . Heart attack Neg Hx   . High Cholesterol Neg Hx   . Colon cancer Neg Hx     Past Surgical History:  Procedure Laterality Date  . APPENDECTOMY    . TONSILLECTOMY AND ADENOIDECTOMY     Social History   Occupational History  . Not on file  Tobacco Use  . Smoking status: Never Smoker  . Smokeless tobacco: Never Used  Substance and Sexual Activity  . Alcohol use: No  . Drug use: No  . Sexual activity: Not on file

## 2020-08-08 ENCOUNTER — Other Ambulatory Visit: Payer: Self-pay | Admitting: Family

## 2020-08-08 ENCOUNTER — Other Ambulatory Visit: Payer: Self-pay

## 2020-08-08 MED FILL — Apixaban Tab 5 MG: ORAL | 7 days supply | Qty: 14 | Fill #0 | Status: AC

## 2020-08-08 MED FILL — Duloxetine HCl Enteric Coated Pellets Cap 30 MG (Base Eq): ORAL | 30 days supply | Qty: 60 | Fill #0 | Status: AC

## 2020-08-11 ENCOUNTER — Other Ambulatory Visit: Payer: Self-pay

## 2020-08-11 MED ORDER — ELIQUIS 5 MG PO TABS
ORAL_TABLET | Freq: Two times a day (BID) | ORAL | 1 refills | Status: DC
  Filled 2020-08-11: qty 60, 30d supply, fill #0
  Filled 2020-09-05: qty 60, 30d supply, fill #1

## 2020-08-15 ENCOUNTER — Other Ambulatory Visit: Payer: Self-pay

## 2020-08-27 ENCOUNTER — Ambulatory Visit: Payer: Self-pay | Admitting: Family

## 2020-08-28 ENCOUNTER — Encounter: Payer: Self-pay | Admitting: Orthopedic Surgery

## 2020-08-28 ENCOUNTER — Ambulatory Visit (INDEPENDENT_AMBULATORY_CARE_PROVIDER_SITE_OTHER): Payer: Self-pay | Admitting: Physician Assistant

## 2020-08-28 DIAGNOSIS — M25561 Pain in right knee: Secondary | ICD-10-CM

## 2020-08-28 MED ORDER — MELOXICAM 15 MG PO TABS
15.0000 mg | ORAL_TABLET | Freq: Every day | ORAL | 0 refills | Status: DC
  Filled 2020-08-28: qty 30, 30d supply, fill #0

## 2020-08-28 NOTE — Progress Notes (Signed)
Office Visit Note   Patient: Leah Roberts           Date of Birth: Oct 15, 1950           MRN: [id number] Visit Date: 08/28/2020              Requested by: Allegra Grana, FNP [address] [place],  [place] [address] PCP: Allegra Grana, FNP  Chief Complaint  Patient presents with  . Right Knee - Pain      HPI: Patient is a pleasant 70 year old woman who I have been following for right knee pain.  She has tried an injection in both knees and while the left injection helped her right was very short-lived.  She was on a short course of prednisone which only helped minimally.  She now says the knee locks and catches and sometimes gives way on her with shooting pain up to her hip and down to her leg.  When I question her if that this may be coming from her back she is adamant that it is her knee.  She has had sciatica in the past and states this is different.  Assessment & Plan: Visit Diagnoses:  1. Right knee pain, unspecified chronicity     Plan: We will go forward with an MRI of her knee follow-up with Dr. Lajoyce Corners afterwards.  In the meantime I will prescribe some meloxicam which she can take intermittently with the Tylenol for relief.  I still cannot completely rule out a radicular cause for this but she wants to start by getting an MRI of the knee she is sure this is where the pain is coming from.  Given some of her mechanical symptoms we will go this route first  Follow-Up Instructions: No follow-ups on file.   Ortho Exam  Patient is alert, oriented, no adenopathy, well-dressed, normal affect, normal respiratory effort. Examination of her right knee no effusion no swelling no warmth no erythema.  She does have pain with terminal extension and flexion.  She is relying on a cane.  She has good plus motor strength distally she has no radicular findings today.  Cannot appreciate any significant medial or lateral joint pain.  Imaging: No results found. No  images are attached to the encounter.  Labs: Lab Results  Component Value Date   HGBA1C 6.2 02/15/2018   HGBA1C 6.2 10/05/2017   HGBA1C 6.3 06/18/2016   ESRSEDRATE 10 05/29/2020   CRP <1.0 05/29/2020     Lab Results  Component Value Date   ALBUMIN 3.9 06/18/2016    No results found for: MG Lab Results  Component Value Date   VD25OH 21.43 (L) 10/05/2017    No results found for: PREALBUMIN CBC EXTENDED Latest Ref Rng & Units 05/29/2020 01/01/2020 12/31/2019  WBC 4.0 - 10.5 K/uL 6.9 7.6 6.2  RBC 3.87 - 5.11 Mil/uL 4.56 4.05 4.43  HGB 12.0 - 15.0 g/dL 24.5 10.7(L) 11.9(L)  HCT 36.0 - 46.0 % 38.0 32.9(L) 37.0  PLT 150.0 - 400.0 K/uL 227.0 222 226  NEUTROABS 1.4 - 7.7 K/uL 3.0 - -  LYMPHSABS 0.7 - 4.0 K/uL 3.4 - -     There is no height or weight on file to calculate BMI.  Orders:  Orders Placed This Encounter  Procedures  . MR Knee Right w/o contrast   Meds ordered this encounter  Medications  . meloxicam (MOBIC) 15 MG tablet    Sig: Take 1 tablet (15 mg total) by mouth daily.  Dispense:  30 tablet    Refill:  0     Procedures: No procedures performed  Clinical Data: No additional findings.  ROS:  All other systems negative, except as noted in the HPI. Review of Systems  Objective: Vital Signs: There were no vitals taken for this visit.  Specialty Comments:  No specialty comments available.  PMFS History: Patient Active Problem List   Diagnosis Date Noted  . Anemia 01/09/2020  . HTN (hypertension) 12/31/2019  . Chest pain 12/31/2019  . DOE (dyspnea on exertion) 12/31/2019  . Acute coronary syndrome (HCC) 12/31/2019  . Unstable angina (HCC) 12/31/2019  . Atherosclerosis 08/23/2018  . Screening for tuberculosis 07/12/2018  . 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 Arthralgia 07/12/2018  . Solitary pulmonary nodule 07/12/2018  . Neck pain 02/15/2018  . Dizziness 02/15/2018  . Elevated TSH 02/15/2018  . Screening for HIV (human immunodeficiency virus)  10/05/2017  . Neuropathy 10/05/2017  . Screening for colon cancer 10/05/2017  . Hyperlipidemia 08/04/2016  . Prediabetes 08/04/2016  . Back pain 08/04/2016  . Left knee pain 06/18/2016  . History of DVT of lower extremity 04/01/2014  . Pulmonary embolism (HCC) 05/04/2011   Past Medical History:  Diagnosis Date  . DVT (deep venous thrombosis) (HCC)   . DVT (deep venous thrombosis) (HCC) 2013  . History of hypertension   . Hypertension   . Pulmonary embolism (HCC) 2013    Family History  Problem Relation Age of Onset  . Hypertension Father   . Heart disease Father 18  . Thyroid disease Sister   . Allergies Sister   . CVA Neg Hx   . Heart attack Neg Hx   . High Cholesterol Neg Hx   . Colon cancer Neg Hx     Past Surgical History:  Procedure Laterality Date  . APPENDECTOMY    . TONSILLECTOMY AND ADENOIDECTOMY     Social History   Occupational History  . Not on file  Tobacco Use  . Smoking status: Never Smoker  . Smokeless tobacco: Never Used  Substance and Sexual Activity  . Alcohol use: No  . Drug use: No  . Sexual activity: Not on file

## 2020-08-29 ENCOUNTER — Other Ambulatory Visit: Payer: Self-pay

## 2020-09-01 ENCOUNTER — Other Ambulatory Visit: Payer: Self-pay

## 2020-09-02 ENCOUNTER — Other Ambulatory Visit: Payer: Self-pay

## 2020-09-05 ENCOUNTER — Other Ambulatory Visit: Payer: Self-pay | Admitting: Family

## 2020-09-05 ENCOUNTER — Other Ambulatory Visit: Payer: Self-pay

## 2020-09-05 MED ORDER — GABAPENTIN 100 MG PO CAPS
ORAL_CAPSULE | Freq: Three times a day (TID) | ORAL | 1 refills | Status: DC
  Filled 2020-09-05: qty 90, 30d supply, fill #0
  Filled 2020-11-06: qty 90, 30d supply, fill #1

## 2020-09-05 MED FILL — Duloxetine HCl Enteric Coated Pellets Cap 30 MG (Base Eq): ORAL | 30 days supply | Qty: 60 | Fill #1 | Status: AC

## 2020-09-16 ENCOUNTER — Other Ambulatory Visit: Payer: Self-pay

## 2020-09-16 ENCOUNTER — Other Ambulatory Visit: Payer: Self-pay | Admitting: Family

## 2020-09-16 MED ORDER — ELIQUIS 5 MG PO TABS
ORAL_TABLET | Freq: Two times a day (BID) | ORAL | 1 refills | Status: DC
  Filled 2020-09-16: qty 60, fill #0
  Filled 2020-10-06: qty 60, 30d supply, fill #0
  Filled 2020-11-05: qty 60, 30d supply, fill #1

## 2020-09-16 MED ORDER — DULOXETINE HCL 30 MG PO CPEP
30.0000 mg | ORAL_CAPSULE | Freq: Two times a day (BID) | ORAL | 3 refills | Status: DC
  Filled 2020-09-16 – 2020-10-06 (×2): qty 60, 30d supply, fill #0
  Filled 2020-11-05: qty 60, 30d supply, fill #1
  Filled 2020-12-09: qty 60, 30d supply, fill #2
  Filled ????-??-??: fill #0

## 2020-09-25 ENCOUNTER — Other Ambulatory Visit: Payer: Self-pay

## 2020-10-01 ENCOUNTER — Other Ambulatory Visit: Payer: Self-pay

## 2020-10-06 ENCOUNTER — Other Ambulatory Visit: Payer: Self-pay

## 2020-10-07 ENCOUNTER — Telehealth: Payer: Self-pay | Admitting: Pharmacy Technician

## 2020-10-07 NOTE — Telephone Encounter (Signed)
Received updated proof of income.  Patient eligible to receive medication assistance at [facility] until time for re-certification in 1610, and as long as eligibility requirements continue to be met.  [facility]

## 2020-10-10 ENCOUNTER — Other Ambulatory Visit: Payer: Self-pay

## 2020-10-16 ENCOUNTER — Ambulatory Visit (INDEPENDENT_AMBULATORY_CARE_PROVIDER_SITE_OTHER): Payer: Self-pay

## 2020-10-16 ENCOUNTER — Ambulatory Visit (INDEPENDENT_AMBULATORY_CARE_PROVIDER_SITE_OTHER): Payer: Self-pay | Admitting: Physician Assistant

## 2020-10-16 ENCOUNTER — Encounter: Payer: Self-pay | Admitting: Orthopedic Surgery

## 2020-10-16 DIAGNOSIS — M79604 Pain in right leg: Secondary | ICD-10-CM

## 2020-10-16 DIAGNOSIS — M79605 Pain in left leg: Secondary | ICD-10-CM

## 2020-10-16 MED ORDER — LIDOCAINE HCL 1 % IJ SOLN
5.0000 mL | INTRAMUSCULAR | Status: AC | PRN
  Administered 2020-10-16: 5 mL

## 2020-10-16 MED ORDER — METHYLPREDNISOLONE ACETATE 40 MG/ML IJ SUSP
40.0000 mg | INTRAMUSCULAR | Status: AC | PRN
  Administered 2020-10-16: 40 mg via INTRA_ARTICULAR

## 2020-10-16 NOTE — Progress Notes (Signed)
Office Visit Note   Patient: Leah Roberts           Date of Birth: [birth year]           MRN: [id number] Visit Date: 10/16/2020              Requested by: Allegra Grana, FNP [address] [place],  [place] [address] PCP: Allegra Grana, FNP  Chief Complaint  Patient presents with  . Right Leg - Pain  . Left Leg - Pain      HPI: Patient presents today for follow-up on her pain in her knees.  She says now that both of her knees are quite painful.  She had an injection in the past and the right knee did not get any relief.  She does think she got some relief from the left knee.  By x-ray she has a history of pretty significantly significant varus arthritis.  We have also tried her on a course of oral steroids which did not help her very much.  She is supposed to get an MRI of her left knee  Assessment & Plan: Visit Diagnoses:  1. Bilateral leg pain     Plan: Findings consistent with arthritis left greater than right.  She did have quite a bit of mechanical symptoms so we will go forward with the MRI of her left knee to see if there would be anything we could help amenable with an arthroscopy.  She will follow-up once this is completed.  Have also recommended repeat injections into her knees.  She cannot take any type of anti-inflammatories because she is on a blood thinner  Follow-Up Instructions: No follow-ups on file.   Ortho Exam  Patient is alert, oriented, no adenopathy, well-dressed, normal affect, normal respiratory effort. Bilateral needs she is wearing knee sleeves bilaterally.  She has moderate soft tissue swelling but no effusion no cellulitis compartments are soft and compressible.  She has no radiating symptoms today  Imaging: XR Lumbar Spine 2-3 Views  Result Date: 10/16/2020 2 views of her lumbar spine overall well-maintained alignment no acute changes may be a small amount of degenerative changes  No images are attached to the  encounter.  Labs: Lab Results  Component Value Date   HGBA1C 6.2 02/15/2018   HGBA1C 6.2 10/05/2017   HGBA1C 6.3 06/18/2016   ESRSEDRATE 10 05/29/2020   CRP <1.0 05/29/2020     Lab Results  Component Value Date   ALBUMIN 3.9 06/18/2016    No results found for: MG Lab Results  Component Value Date   VD25OH 21.43 (L) 10/05/2017    No results found for: PREALBUMIN CBC EXTENDED Latest Ref Rng & Units 05/29/2020 01/01/2020 12/31/2019  WBC 4.0 - 10.5 K/uL 6.9 7.6 6.2  RBC 3.87 - 5.11 Mil/uL 4.56 4.05 4.43  HGB 12.0 - 15.0 g/dL 80.3 10.7(L) 11.9(L)  HCT 36.0 - 46.0 % 38.0 32.9(L) 37.0  PLT 150.0 - 400.0 K/uL 227.0 222 226  NEUTROABS 1.4 - 7.7 K/uL 3.0 - -  LYMPHSABS 0.7 - 4.0 K/uL 3.4 - -     There is no height or weight on file to calculate BMI.  Orders:  Orders Placed This Encounter  Procedures  . XR Lumbar Spine 2-3 Views   No orders of the defined types were placed in this encounter.    Procedures: Large Joint Inj: bilateral knee on 10/16/2020 4:04 PM Indications: pain and diagnostic evaluation Details: 22 G 1.5 in needle, anteromedial approach  Arthrogram: No  Medications (Right): 5 mL lidocaine 1 %; 40 mg methylPREDNISolone acetate 40 MG/ML Medications (Left): 5 mL lidocaine 1 %; 40 mg methylPREDNISolone acetate 40 MG/ML Outcome: tolerated well, no immediate complications Procedure, treatment alternatives, risks and benefits explained, specific risks discussed. Consent was given by the patient.     Clinical Data: No additional findings.  ROS:  All other systems negative, except as noted in the HPI. Review of Systems  Objective: Vital Signs: There were no vitals taken for this visit.  Specialty Comments:  No specialty comments available.  PMFS History: Patient Active Problem List   Diagnosis Date Noted  . Anemia 01/09/2020  . HTN (hypertension) 12/31/2019  . Chest pain 12/31/2019  . DOE (dyspnea on exertion) 12/31/2019  . Acute coronary  syndrome (HCC) 12/31/2019  . Unstable angina (HCC) 12/31/2019  . Atherosclerosis 08/23/2018  . Screening for tuberculosis 07/12/2018  . 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 Arthralgia 07/12/2018  . Solitary pulmonary nodule 07/12/2018  . Neck pain 02/15/2018  . Dizziness 02/15/2018  . Elevated TSH 02/15/2018  . Screening for HIV (human immunodeficiency virus) 10/05/2017  . Neuropathy 10/05/2017  . Screening for colon cancer 10/05/2017  . Hyperlipidemia 08/04/2016  . Prediabetes 08/04/2016  . Back pain 08/04/2016  . Left knee pain 06/18/2016  . History of DVT of lower extremity 04/01/2014  . Pulmonary embolism (HCC) 05/04/2011   Past Medical History:  Diagnosis Date  . DVT (deep venous thrombosis) (HCC)   . DVT (deep venous thrombosis) (HCC) 2013  . History of hypertension   . Hypertension   . Pulmonary embolism (HCC) 2013    Family History  Problem Relation Age of Onset  . Hypertension Father   . Heart disease Father 64  . Thyroid disease Sister   . Allergies Sister   . CVA Neg Hx   . Heart attack Neg Hx   . High Cholesterol Neg Hx   . Colon cancer Neg Hx     Past Surgical History:  Procedure Laterality Date  . APPENDECTOMY    . TONSILLECTOMY AND ADENOIDECTOMY     Social History   Occupational History  . Not on file  Tobacco Use  . Smoking status: Never  . Smokeless tobacco: Never  Substance and Sexual Activity  . Alcohol use: No  . Drug use: No  . Sexual activity: Not on file

## 2020-10-22 ENCOUNTER — Other Ambulatory Visit: Payer: Self-pay

## 2020-10-29 ENCOUNTER — Other Ambulatory Visit: Payer: Self-pay

## 2020-11-05 ENCOUNTER — Other Ambulatory Visit: Payer: Self-pay

## 2020-11-06 ENCOUNTER — Other Ambulatory Visit: Payer: Self-pay

## 2020-11-24 ENCOUNTER — Ambulatory Visit: Payer: Self-pay | Admitting: Orthopedic Surgery

## 2020-11-27 ENCOUNTER — Ambulatory Visit (INDEPENDENT_AMBULATORY_CARE_PROVIDER_SITE_OTHER): Payer: Self-pay | Admitting: Orthopedic Surgery

## 2020-11-27 ENCOUNTER — Other Ambulatory Visit: Payer: Self-pay

## 2020-11-27 DIAGNOSIS — M1711 Unilateral primary osteoarthritis, right knee: Secondary | ICD-10-CM

## 2020-11-27 MED ORDER — HYDROCODONE-ACETAMINOPHEN 5-325 MG PO TABS: 1.0000 | ORAL_TABLET | Freq: Four times a day (QID) | ORAL | 0 refills | Status: DC | PRN

## 2020-12-09 ENCOUNTER — Other Ambulatory Visit: Payer: Self-pay

## 2020-12-09 ENCOUNTER — Other Ambulatory Visit: Payer: Self-pay | Admitting: Family

## 2020-12-10 ENCOUNTER — Other Ambulatory Visit: Payer: Self-pay | Admitting: Family

## 2020-12-10 ENCOUNTER — Other Ambulatory Visit: Payer: Self-pay

## 2020-12-10 MED ORDER — APIXABAN 5 MG PO TABS
5.0000 mg | ORAL_TABLET | Freq: Two times a day (BID) | ORAL | 1 refills | Status: DC
  Filled 2020-12-10: qty 60, 30d supply, fill #0
  Filled 2021-01-12: qty 60, 30d supply, fill #1

## 2020-12-10 MED ORDER — GABAPENTIN 100 MG PO CAPS
100.0000 mg | ORAL_CAPSULE | Freq: Three times a day (TID) | ORAL | 1 refills | Status: DC
  Filled 2020-12-10: qty 90, 30d supply, fill #0
  Filled 2021-01-12: qty 90, 30d supply, fill #1

## 2020-12-18 ENCOUNTER — Telehealth: Payer: Self-pay | Admitting: Family

## 2020-12-18 NOTE — Telephone Encounter (Signed)
Patient informed, Due to the high volume of calls and your symptoms we have to forward your call to our Triage Nurse to expedient your call. Please hold for the transfer.  Patient transferred to Access Nurse. Due to having a rash on her back for the past two weeks that has been itching,bleeding and causing her to have trouble sleeping.No openings in office or virtual.

## 2020-12-19 ENCOUNTER — Encounter: Payer: Self-pay | Admitting: Emergency Medicine

## 2020-12-19 ENCOUNTER — Other Ambulatory Visit: Payer: Self-pay

## 2020-12-19 ENCOUNTER — Ambulatory Visit
Admission: EM | Admit: 2020-12-19 | Discharge: 2020-12-19 | Disposition: A | Discharge status: Home / Self Care | Payer: Self-pay | Attending: Emergency Medicine | Admitting: Emergency Medicine

## 2020-12-19 DIAGNOSIS — R21 Rash and other nonspecific skin eruption: Secondary | ICD-10-CM

## 2020-12-19 MED ORDER — PREDNISONE 10 MG (21) PO TBPK: ORAL_TABLET | Freq: Every day | ORAL | 0 refills | Status: DC

## 2020-12-19 NOTE — Telephone Encounter (Signed)
Providing access nurse documentation. Pt has been evaluated for rash at [facility] and given prednisone. Pt has an appointment with PCP scheduled for 12/22/20

## 2020-12-19 NOTE — ED Provider Notes (Signed)
[facility]    CSN: [id number] Arrival date & time: 12/19/20  1257      History   Chief Complaint Chief Complaint  Patient presents with   Rash    HPI Shakendra Pruitt is a 70 y.o. female.  Patient presents with 2-week history of pruritic rash in the center of her back.  The rash started as a couple of bumps and then spread to a few more bumps from mid back to lower back.  She denies fever, chills, sore throat, cough, chest pain, shortness of breath, or other symptoms.  Treatment at home with topical baking soda.  Her medical history includes coronary syndrome, hypertension, pulmonary embolism, DVT, prediabetes. She is on Eliquis.   The history is provided by the patient and medical records.   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   Arthralgia 07/12/2018   Solitary pulmonary nodule 07/12/2018   Neck pain 02/15/2018   Dizziness 02/15/2018   Elevated TSH 02/15/2018   Screening for HIV (human immunodeficiency virus) 10/05/2017   Neuropathy 10/05/2017   Screening for colon cancer 10/05/2017   Hyperlipidemia 08/04/2016   Prediabetes 08/04/2016   Back pain 08/04/2016   Left knee pain 06/18/2016   History of DVT of lower extremity 04/01/2014   Pulmonary embolism (HCC) 05/04/2011    Past Surgical History:  Procedure Laterality Date   APPENDECTOMY     TONSILLECTOMY AND ADENOIDECTOMY      OB History   No obstetric history on file.      Home Medications     Prior to Admission medications   Medication Sig Start Date End Date Taking? Authorizing Provider  predniSONE (STERAPRED UNI-PAK 21 TAB) 10 MG (21) TBPK tablet Take by mouth daily. As directed 12/19/20  Yes Mickie Bail, NP  apixaban (ELIQUIS) 5 MG TABS tablet Take 1 tablet (5 mg total) by mouth 2 (two) times daily. 12/10/20   Allegra Grana, FNP  diclofenac Sodium (VOLTAREN) 1 % GEL Apply 4 g topically 4 (four) times daily. 05/26/20   Allegra Grana, FNP  DULoxetine (CYMBALTA) 30 MG capsule Take 1 capsule (30 mg total) by mouth 2 (two) times daily. 09/16/20 09/16/21  Allegra Grana, FNP  gabapentin (NEURONTIN) 100 MG capsule Take 1 capsule (100 mg total) by mouth 3 (three) times daily. 12/10/20   Allegra Grana, FNP  HYDROcodone-acetaminophen (NORCO/VICODIN) 5-325 MG tablet Take 1 tablet by mouth every 6 (six) hours as needed for moderate pain. 11/27/20   Nadara Mustard, MD  loratadine (CLARITIN) 10 MG tablet Take 10 mg by mouth daily.    [provider]  meloxicam (MOBIC) 15 MG tablet Take 1 tablet (15 mg total) by mouth daily. 08/28/20   Persons, [place], [place]    Family History Family History  Problem Relation Age of Onset   Hypertension Father    Heart disease Father 3   Thyroid disease Sister    Allergies Sister  CVA Neg Hx    Heart attack Neg Hx    High Cholesterol Neg Hx    Colon cancer Neg Hx     Social History Social History   Tobacco Use   Smoking status: Never   Smokeless tobacco: Never  Substance Use Topics   Alcohol use: No   Drug use: No     Allergies   Sulfa antibiotics and Sulfa antibiotics   Review of Systems Review of Systems  Constitutional:  Negative for chills and fever.  HENT:  Negative for ear pain and sore throat.   Respiratory:  Negative for cough and shortness of breath.   Cardiovascular:  Negative for chest pain and palpitations.  Skin:  Positive for rash. Negative for color change.  All other systems reviewed and are  negative.   Physical Exam Triage Vital Signs ED Triage Vitals  Enc Vitals Group     BP      Pulse      Resp      Temp      Temp src      SpO2      Weight      Height      Head Circumference      Peak Flow      Pain Score      Pain Loc      Pain Edu?      Excl. in GC?    No data found.  Updated Vital Signs BP (!) 149/90   Pulse 70   Temp 98.8 F (37.1 C)   Resp 18   SpO2 98%   Visual Acuity Right Eye Distance:   Left Eye Distance:   Bilateral Distance:    Right Eye Near:   Left Eye Near:    Bilateral Near:     Physical Exam Vitals and nursing note reviewed.  Constitutional:      General: She is not in acute distress.    Appearance: She is well-developed. She is not ill-appearing.  HENT:     Head: Normocephalic and atraumatic.     Mouth/Throat:     Mouth: Mucous membranes are moist.  Eyes:     Conjunctiva/sclera: Conjunctivae normal.  Cardiovascular:     Rate and Rhythm: Normal rate and regular rhythm.     Heart sounds: Normal heart sounds.  Pulmonary:     Effort: Pulmonary effort is normal. No respiratory distress.     Breath sounds: Normal breath sounds.  Abdominal:     Palpations: Abdomen is soft.     Tenderness: There is no abdominal tenderness.  Musculoskeletal:     Cervical back: Neck supple.  Skin:    General: Skin is warm and dry.     Findings: Rash present.     Comments: Few, scattered, discrete papules in various stages of healing from upper back to lower back midline.  No drainage.  No erythema.  Neurological:     General: No focal deficit present.     Mental Status: She is alert and oriented to person, place, and time.  Psychiatric:        Mood and Affect: Mood normal.        Behavior: Behavior normal.     UC Treatments / Results  Labs (all labs ordered are listed, but only abnormal results are displayed) Labs Reviewed - No data to display  EKG   Radiology No results found.  Procedures Procedures (including critical  care time)  Medications Ordered in UC Medications - No  data to display  Initial Impression / Assessment and Plan / UC Course  I have reviewed the triage vital signs and the nursing notes.  Pertinent labs & imaging results that were available during my care of the patient were reviewed by me and considered in my medical decision making (see chart for details).  Pruritic rash on back.  Treating with prednisone and Benadryl.  Precautions for drowsiness with Benadryl discussed.  Instructed patient to follow-up with her PCP if her symptoms are not improving.  She agrees to plan of care.   Final Clinical Impressions(s) / UC Diagnoses   Final diagnoses:  Rash     Discharge Instructions      Take the prednisone as directed.    Take Benadryl every 6 hours as directed; do not drive, operate machinery, or drink alcohol with this medication as it may cause drowsiness.    Follow up with your primary care provider if your symptoms are not improving.         ED Prescriptions     Medication Sig Dispense Auth. Provider   predniSONE (STERAPRED UNI-PAK 21 TAB) 10 MG (21) TBPK tablet Take by mouth daily. As directed 21 tablet Mickie Bail, NP      PDMP not reviewed this encounter.   Mickie Bail, NP 12/19/20 1324

## 2020-12-19 NOTE — Discharge Instructions (Addendum)
Take the prednisone as directed.    Take Benadryl every 6 hours as directed; do not drive, operate machinery, or drink alcohol with this medication as it may cause drowsiness.    Follow up with your primary care provider if your symptoms are not improving.

## 2020-12-19 NOTE — ED Triage Notes (Signed)
Pt here with rash going down the center of her back that itches intermittently.

## 2020-12-19 NOTE — Telephone Encounter (Signed)
Providing second half of access nurse documentation.

## 2020-12-22 ENCOUNTER — Ambulatory Visit (INDEPENDENT_AMBULATORY_CARE_PROVIDER_SITE_OTHER): Payer: No Typology Code available for payment source | Admitting: Family

## 2020-12-22 ENCOUNTER — Encounter: Payer: Self-pay | Admitting: Family

## 2020-12-22 ENCOUNTER — Other Ambulatory Visit: Payer: Self-pay

## 2020-12-22 DIAGNOSIS — I2699 Other pulmonary embolism without acute cor pulmonale: Secondary | ICD-10-CM

## 2020-12-22 DIAGNOSIS — M25561 Pain in right knee: Secondary | ICD-10-CM

## 2020-12-22 DIAGNOSIS — G8929 Other chronic pain: Secondary | ICD-10-CM

## 2020-12-22 DIAGNOSIS — M25562 Pain in left knee: Secondary | ICD-10-CM

## 2020-12-22 MED ORDER — TRAMADOL HCL 50 MG PO TABS: 50.0000 mg | ORAL_TABLET | Freq: Every day | ORAL | 1 refills | Status: DC | PRN

## 2020-12-22 NOTE — Progress Notes (Signed)
Subjective:    Patient ID: Leah Roberts, female    DOB: Feb 10, 1951, 70 y.o.   MRN: [id number]  CC: Leah Roberts is a 70 y.o. female who presents today for follow up.   HPI: Complains of pruritic rash which started on her back, this nearly resolved with the exception of 1 small lesion on the right side of her neck.  She suspects it was bug bites as they were intensely pruritic.  Lesions are drying up.  Started 2 weeks ago. She finishes prednisone in 2 days.  No fever.  No new detergent/lotion, medications.   Seen at Urgent care 12/19/2020 for rash.  Given prednisone; advised OTC benadryl with improvement.   Bilateral knee pain however most bothered by left knee pain currenly- Following with Dr Lajoyce Corners  whom recommends left knee replacement however she is waiting until the new year as she has a wedding to attend in December of this year. Corticosteroid injections in both knees with mixed result. She is using a cane and no longer working in garden or cooking as she has enjoyed due to knee pain. She uses icy hot with relief. She is not using voltaren gel.  She feels her knees will give way.  No recent falls   she was provided with 30 tablets of hydrocodone acetaminophen orthopedics to be used for knee pain.  She was advised they would no longer prescribe this medication and to speak with her PCP for a refill.  She is requesting a refill today.  She is compliant with cymbalta 30mg  BID however hasnt noticed improvement.    Breakfast is eggs, oats, milk, banana. She skips lunch. She eats rice, cornmeal, chicken or beef at dinner No soda.  Sometimes chips .  History of pulmonary embolism-compliant with Eliquis 5 mg.  No bleeding  No h/o seizure. Very rare alcohol use such one glass of wine. No h/o anorexia or bulimia.     Cologuard  negative 5 months ago.   She is not using mobic.  HISTORY:  Past Medical History:  Diagnosis Date   DVT (deep venous thrombosis) (HCC)    DVT (deep  venous thrombosis) (HCC) 2013   History of hypertension    Hypertension    Pulmonary embolism (HCC) 2013   Past Surgical History:  Procedure Laterality Date   APPENDECTOMY     TONSILLECTOMY AND ADENOIDECTOMY     Family History  Problem Relation Age of Onset   Hypertension Father    Heart disease Father 60   Thyroid disease Sister    Allergies Sister    CVA Neg Hx    Heart attack Neg Hx    High Cholesterol Neg Hx    Colon cancer Neg Hx     Allergies: Sulfa antibiotics and Sulfa antibiotics Current Outpatient Medications on File Prior to Visit  Medication Sig Dispense Refill   apixaban (ELIQUIS) 5 MG TABS tablet Take 1 tablet (5 mg total) by mouth 2 (two) times daily. 60 tablet 1   gabapentin (NEURONTIN) 100 MG capsule Take 1 capsule (100 mg total) by mouth 3 (three) times daily. 90 capsule 1   HYDROcodone-acetaminophen (NORCO/VICODIN) 5-325 MG tablet Take 1 tablet by mouth every 6 (six) hours as needed for moderate pain. 30 tablet 0   No current facility-administered medications on file prior to visit.    Social History   Tobacco Use   Smoking status: Never   Smokeless tobacco: Never  Substance Use Topics   Alcohol use: No   Drug  use: No    Review of Systems  Constitutional:  Negative for chills and fever.  Respiratory:  Negative for cough.   Cardiovascular:  Negative for chest pain and palpitations.  Gastrointestinal:  Negative for nausea and vomiting.  Musculoskeletal:  Positive for arthralgias and joint swelling.  Skin:  Positive for rash.     Objective:    BP 114/76   Pulse 95   Temp 98 F (36.7 C)   Ht 5\' 5"  (1.651 m)   Wt 222 lb (100.7 kg)   SpO2 95%   BMI 36.94 kg/m  BP Readings from Last 3 Encounters:  12/22/20 114/76  12/19/20 (!) 149/90  06/23/20 122/84   Wt Readings from Last 3 Encounters:  12/22/20 222 lb (100.7 kg)  08/07/20 226 lb (102.5 kg)  06/23/20 226 lb 9.6 oz (102.8 kg)    Physical Exam Vitals reviewed.  Constitutional:       Appearance: She is well-developed.  Eyes:     Conjunctiva/sclera: Conjunctivae normal.  Cardiovascular:     Rate and Rhythm: Normal rate and regular rhythm.     Pulses: Normal pulses.     Heart sounds: Normal heart sounds.  Pulmonary:     Effort: Pulmonary effort is normal.     Breath sounds: Normal breath sounds. No wheezing, rhonchi or rales.  Musculoskeletal:     Right knee: No deformity, effusion or erythema. Decreased range of motion.     Left knee: No deformity, effusion or erythema. Decreased range of motion.     Right lower leg: No edema.     Left lower leg: No edema.     Comments: Bilateral knees are symmetric. No effusion appreciated. No increase in warmth or erythema. Crepitus felt with flexion of bilateral knees. Bilateral knees symmetrically enlarged.  No obvious joint effusion with palpation or inspection.  No increased warmth, erythema  Able to extend to -5 to 10 degrees and flex to 110 degrees with pain bilaterally. No catching with McMurray maneuver. No patellar apprehension. Negative anterior drawer and lachman's- no laxity appreciated.  No calf tenderness of lower leg edema bilaterally.    Skin:    General: Skin is warm and dry.  Neurological:     Mental Status: She is alert.  Psychiatric:        Speech: Speech normal.        Behavior: Behavior normal.        Thought Content: Thought content normal.       Assessment & Plan:   Problem List Items Addressed This Visit       Cardiovascular and Mediastinum   Pulmonary embolism (HCC)    Chronic, stable.  Compliant with Eliquis 5 mg twice daily.  Advised against any NSAIDs due to risk of bleeding.  She verbalized understanding.  I have discontinued Voltaren gel, meloxicam        Other   Bilateral knee pain    Chronic, poorly controlled.  Discussed conservative measures including aggressive icing regimen, Ace wraps to provide support, stability.  She will continue to follow with orthopedics as it relates to  surgical options.  For medical management, I agreed to prescribe tramadol.  I have discontinued oxycodone acetaminophen.  Advised that this is a controlled substance and must be stored in a safe place.  Controlled contract is in place.  She understands a follow-up with me every 3 months.  We also lengthy discussion as it relates to weight and bilateral knee pain.  I advised her to consider  metformin or Wellbutrin.  I have advised patient to wean off Cymbalta as this medication has not been effective.  she will consider these options and we will discuss at follow-up. I looked up patient on [place] Controlled Substances Reporting System PMP AWARE and saw no activity that raised concern of inappropriate use.          I have discontinued Cynthie Schirm's loratadine, diclofenac Sodium, meloxicam, DULoxetine, HYDROcodone-acetaminophen, and predniSONE. I am also having her start on traMADol. Additionally, I am having her maintain her apixaban and gabapentin.   Meds ordered this encounter  Medications   traMADol (ULTRAM) 50 MG tablet    Sig: Take 1 tablet (50 mg total) by mouth daily as needed.    Dispense:  30 tablet    Refill:  1    Order Specific Question:   Supervising Provider    Answer:   Sherlene Shams [[id number]]     Return precautions given.   Risks, benefits, and alternatives of the medications and treatment plan prescribed today were discussed, and patient expressed understanding.   Education regarding symptom management and diagnosis given to patient on AVS.  Continue to follow with Allegra Grana, FNP for routine health maintenance.   Vance Oralia Rud and I agreed with plan.   Rennie Plowman, FNP

## 2020-12-22 NOTE — Patient Instructions (Addendum)
Please stop oxycodone as prescribed by orthopedic.  I will give you tramadol which is an opioid like medication to use for pain as needed.  Please use this medication sparingly and store in a safe place.  We will need to see me every 3 months we will continue this prescription   wean to cymbalta one 30mg  daily for one week, and then stop medication altogether.   Do not use mobic ( meloxicam).   Please consider for weight loss either wellbutrin OR metformin.  is also an excellent choice however it on backorder.   This is  Dr. Reginal Lutes  example of a  "Low GI"  Diet:  It will allow you to lose 4 to 8  lbs  per month if you follow it carefully.  Your goal with exercise is a minimum of 30 minutes of aerobic exercise 5 days per week (Walking does not count once it becomes easy!)    All of the foods can be found at grocery stores and in bulk at [employer].  The Atkins protein bars and shakes are available in more varieties at Target, WalMart and [employer]'s Foods.     7 AM Breakfast:  Choose from the following:  Low carbohydrate Protein  Shakes (I recommend the  Premier Protein chocolate shakes,  EAS AdvantEdge "Carb Control" shakes  Or the Atkins shakes all are under 3 net carbs)     a scrambled egg/bacon/cheese burrito made with [employer]'s "carb balance" whole wheat tortilla  (about 10 net carbs )  Rohm [employer] Haas (basically a quiche without the pastry crust) that is eaten cold and very convenient way to get your eggs.  8 carbs)  If you make your own protein shakes, avoid bananas and pineapple,  And use low carb greek yogurt or original /unsweetened almond or soy milk    Avoid cereal and bananas, oatmeal and cream of wheat and grits. They are loaded with carbohydrates!   10 AM: high protein snack:  Protein bar by Atkins (the snack size, under 200 cal, usually < 6 net carbs).    A stick of cheese:  Around 1 carb,  100 cal     Dannon Light n Fit Medical laboratory scientific officer Yogurt  (80 cal, 8  carbs)  Other so called "protein bars" and Greek yogurts tend to be loaded with carbohydrates.  Remember, in food advertising, the word "energy" is synonymous for " carbohydrate."  Lunch:   A Sandwich using the bread choices listed, Can use any  Eggs,  lunchmeat, grilled meat or canned tuna), avocado, regular mayo/mustard  and cheese.  A Salad using blue cheese, ranch,  Goddess or vinagrette,  Avoid taco shells, croutons or "confetti" and no "candied nuts" but regular nuts OK.   No pretzels, nabs  or chips.  Pickles and miniature sweet peppers are a good low carb alternative that provide a "crunch"  The bread is the only source of carbohydrate in a sandwich and  can be decreased by trying some of the attached alternatives to traditional loaf bread   Avoid "Low fat dressings, as well as [place] and Reyne Dumas dressings They are loaded with sugar!   3 PM/ Mid day  Snack:  Consider  1 ounce of  almonds, walnuts, pistachios, pecans, peanuts,  Macadamia nuts or a nut medley.  Avoid "granola and granola bars "  Mixed nuts are ok in moderation as long as there are no raisins,  cranberries or dried fruit.   KIND bars  are OK if you get the low glycemic index variety   Try the prosciutto/mozzarella cheese sticks by [employer]  In deli /backery section   High protein      6 PM  Dinner:     Meat/fowl/fish with a green salad, and either broccoli, cauliflower, green beans, spinach, brussel sprouts or  Lima beans. DO NOT BREAD THE PROTEIN!!      There is a low carb pasta by Dreamfield's that is acceptable and tastes great: only 5 digestible carbs/serving.( All grocery stores but BJs carry it ) Several ready made meals are available low carb:   Try Michel Angelo's chicken piccata or chicken or eggplant parm over low carb pasta.(Lowes and BJs)   Clifton Custard Sanchez's "Carnitas" (pulled pork, no sauce,  0 carbs) or his beef pot roast to make a dinner burrito (at [employer]J's)  Pesto over low carb pasta (bj's sells a  good quality pesto in the center refrigerated section of the deli   Try satueeing  Roosvelt Harps with mushroooms as a good side   Green Giant makes a mashed cauliflower that tastes like mashed potatoes  Whole wheat pasta is still full of digestible carbs and  Not as low in glycemic index as Dreamfield's.   Brown rice is still rice,  So skip the rice and noodles if you eat [place] or [place] (or at least limit to 1/2 cup)  9 PM snack :   Breyer's "low carb" fudgsicle or  ice cream bar (Carb Smart line), or  Weight Watcher's ice cream bar , or another "no sugar added" ice cream;  a serving of fresh berries/cherries with whipped cream   Cheese or DANNON'S LlGHT [employer] GREEK YOGURT  8 ounces of [place] unsweetened almond/cococunut milk    Treat yourself to a parfait made with whipped cream blueberiies, walnuts and vanilla greek yogurt  Avoid bananas, pineapple, grapes  and watermelon on a regular basis because they are high in sugar.  THINK OF THEM AS DESSERT  Remember that snack Substitutions should be less than 10 NET carbs per serving and meals < 20 carbs. Remember to subtract fiber grams to get the "net carbs."

## 2020-12-23 ENCOUNTER — Telehealth: Payer: Self-pay | Admitting: Orthopedic Surgery

## 2020-12-23 ENCOUNTER — Telehealth: Payer: Self-pay | Admitting: Family

## 2020-12-23 ENCOUNTER — Other Ambulatory Visit: Payer: Self-pay

## 2020-12-23 ENCOUNTER — Encounter: Payer: Self-pay | Admitting: Orthopedic Surgery

## 2020-12-23 MED ORDER — TRAMADOL HCL 50 MG PO TABS: 50.0000 mg | ORAL_TABLET | Freq: Every day | ORAL | 1 refills | Status: DC

## 2020-12-23 MED ORDER — HYDROCODONE-ACETAMINOPHEN 5-325 MG PO TABS: 1.0000 | ORAL_TABLET | Freq: Four times a day (QID) | ORAL | 0 refills | Status: DC | PRN

## 2020-12-23 NOTE — Telephone Encounter (Signed)
FYI Dr Lajoyce Corners Thanks for seeing her. Sorry for confusion below. Had prescribed tramadol yesterday to trial for pain until hopefully she can have left knee replacement. Per patient in the New year.   Sarah, call pt  I saw the patient yesterday in clinic and she had explained to me by Dr. Lajoyce Corners would no longer refill her hydrooxycodone-acetaminophen.  She asked me to refill   I can see today that she did call and request medication.  I have sent in tramadol as of yesterday to be used for pain prn.  I do not think this was poor-intentioned, however I am thinking that patient may be confused.  It is very important to articulate to her that she is not to take both these medications at the same time, or on the same day.  I will not provide further refills of tramadol if Dr Lajoyce Corners continues to prescribe oxycodone-acetaminophen

## 2020-12-23 NOTE — Telephone Encounter (Signed)
Pt came in asking for a refill on her pain rx because the tylenol she was taking isn't helping. Pt states even if the rx is only to last her for 2 weeks she would appreciate that and pt would like a CB when this has been called in please.   [phone]

## 2020-12-23 NOTE — Telephone Encounter (Signed)
See other telephone note.  

## 2020-12-23 NOTE — Progress Notes (Signed)
Office Visit Note   Patient: Leah Roberts           Date of Birth: Jan 24, 1951           MRN: [id number] Visit Date: 11/27/2020              Requested by: Allegra Grana, FNP [address] [place],  [place] [address] PCP: Allegra Grana, FNP  Chief Complaint  Patient presents with   Right Knee - Follow-up      HPI: Patient is a 70 year old woman who presents in follow-up for chronic osteoarthritis right knee.  Patient has had an MRI scan of her knee which shows osteoarthritis meniscal tears.  She also has left knee pain as well had acute pain while in [place].  Patient has had relief with Vicodin and Toradol in the past she has had no relief with injections or Voltaren gel.  Patient states she is unable to go up and down stairs.    Assessment & Plan: Visit Diagnoses: No diagnosis found.  Plan: Patient states that she has a trip in December to [place] and would like to follow-up for evaluation for knee replacement after this trip.  Prescription for Vicodin called in.  Follow-Up Instructions: Return if symptoms worsen or fail to improve.   Ortho Exam  Patient is alert, oriented, no adenopathy, well-dressed, normal affect, normal respiratory effort. Examination patient has crepitation with range of motion of the right knee.  She has varus alignment of the knee MRI scan shows bone-on-bone contact edema with degenerative meniscal tear.  There is no effusion no cellulitis.  Imaging: No results found. No images are attached to the encounter.  Labs: Lab Results  Component Value Date   HGBA1C 6.2 02/15/2018   HGBA1C 6.2 10/05/2017   HGBA1C 6.3 06/18/2016   ESRSEDRATE 10 05/29/2020   CRP <1.0 05/29/2020     Lab Results  Component Value Date   ALBUMIN 3.9 06/18/2016    No results found for: MG Lab Results  Component Value Date   VD25OH 21.43 (L) 10/05/2017    No results found for: PREALBUMIN CBC EXTENDED Latest Ref Rng & Units 05/29/2020  01/01/2020 12/31/2019  WBC 4.0 - 10.5 K/uL 6.9 7.6 6.2  RBC 3.87 - 5.11 Mil/uL 4.56 4.05 4.43  HGB 12.0 - 15.0 g/dL 00.9 10.7(L) 11.9(L)  HCT 36.0 - 46.0 % 38.0 32.9(L) 37.0  PLT 150.0 - 400.0 K/uL 227.0 222 226  NEUTROABS 1.4 - 7.7 K/uL 3.0 - -  LYMPHSABS 0.7 - 4.0 K/uL 3.4 - -     There is no height or weight on file to calculate BMI.  Orders:  No orders of the defined types were placed in this encounter.  Meds ordered this encounter  Medications   DISCONTD: HYDROcodone-acetaminophen (NORCO/VICODIN) 5-325 MG tablet    Sig: Take 1 tablet by mouth every 6 (six) hours as needed for moderate pain.    Dispense:  30 tablet    Refill:  0     Procedures: No procedures performed  Clinical Data: No additional findings.  ROS:  All other systems negative, except as noted in the HPI. Review of Systems  Objective: Vital Signs: There were no vitals taken for this visit.  Specialty Comments:  No specialty comments available.  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   Arthralgia 07/12/2018   Solitary pulmonary nodule 07/12/2018   Neck pain 02/15/2018   Dizziness 02/15/2018   Elevated TSH 02/15/2018   Screening for HIV (human immunodeficiency virus) 10/05/2017   Neuropathy 10/05/2017   Screening for colon cancer 10/05/2017   Hyperlipidemia 08/04/2016   Prediabetes 08/04/2016   Back pain 08/04/2016   Left knee pain 06/18/2016   History of DVT of lower extremity 04/01/2014   Pulmonary embolism (HCC) 05/04/2011   Past Medical History:  Diagnosis Date   DVT (deep venous thrombosis) (HCC)    DVT (deep venous thrombosis) (HCC) 2013   History of hypertension     Hypertension    Pulmonary embolism (HCC) 2013    Family History  Problem Relation Age of Onset   Hypertension Father    Heart disease Father 26   Thyroid disease Sister    Allergies Sister    CVA Neg Hx    Heart attack Neg Hx    High Cholesterol Neg Hx    Colon cancer Neg Hx     Past Surgical History:  Procedure Laterality Date   APPENDECTOMY     TONSILLECTOMY AND ADENOIDECTOMY     Social History   Occupational History   Not on file  Tobacco Use   Smoking status: Never   Smokeless tobacco: Never  Substance and Sexual Activity   Alcohol use: No   Drug use: No   Sexual activity: Not on file

## 2020-12-23 NOTE — Telephone Encounter (Signed)
Rx called to advise that the Tramadol that was sent in to them is a control and they do not do controls at their location and have discontinued. Please advise.

## 2020-12-23 NOTE — Assessment & Plan Note (Addendum)
Chronic, stable.  Compliant with Eliquis 5 mg twice daily.  Advised against any NSAIDs due to risk of bleeding.  She verbalized understanding.  I have discontinued Voltaren gel, meloxicam

## 2020-12-23 NOTE — Assessment & Plan Note (Addendum)
Chronic, poorly controlled.  Discussed conservative measures including aggressive icing regimen, Ace wraps to provide support, stability.  She will continue to follow with orthopedics as it relates to surgical options.  For medical management, I agreed to prescribe tramadol.  I have discontinued oxycodone acetaminophen.  Advised that this is a controlled substance and must be stored in a safe place.  Controlled contract is in place.  She understands a follow-up with me every 3 months.  We also lengthy discussion as it relates to weight and bilateral knee pain.  I advised her to consider metformin or Wellbutrin.  I have advised patient to wean off Cymbalta as this medication has not been effective.  she will consider these options and we will discuss at follow-up. I looked up patient on [place] Controlled Substances Reporting System PMP AWARE and saw no activity that raised concern of inappropriate use.

## 2020-12-24 NOTE — Telephone Encounter (Signed)
Dr. Duda is aware.  

## 2020-12-24 NOTE — Telephone Encounter (Signed)
Left a message to call back.

## 2020-12-24 NOTE — Telephone Encounter (Signed)
Call patient Dr Lajoyce Corners orthopedic refilled Norco  yesterday.  She will not require tramadol.  It has been discontinued.

## 2020-12-24 NOTE — Addendum Note (Signed)
Addended by: Allegra Grana on: 12/24/2020 11:54 AM   Modules accepted: Orders

## 2020-12-26 NOTE — Telephone Encounter (Signed)
Placed call to pt. Pt states she did not know she had medication sent to [facility] from Dr.Duda. Pt states she will check with [facility] and let us know if they don't have it.

## 2021-01-01 ENCOUNTER — Other Ambulatory Visit: Payer: Self-pay

## 2021-01-01 ENCOUNTER — Telehealth: Payer: Self-pay | Admitting: Family

## 2021-01-01 DIAGNOSIS — G8929 Other chronic pain: Secondary | ICD-10-CM

## 2021-01-01 NOTE — Telephone Encounter (Signed)
Patient is calling in to request a refill on her HYDROcodone-acetaminophen (NORCO/VICODIN) 5-325 MG tablet.She is currently out of this medicine and would like for it to be sent to the [facility] on [address].

## 2021-01-01 NOTE — Telephone Encounter (Signed)
Patient is calling to see if a prescription for lasix can be sent in for the swelling in her legs.Please call her at [phone] to discuss this symptom.

## 2021-01-01 NOTE — Telephone Encounter (Signed)
Correction to the last message. Pt did get that prescription that Dr. Lajoyce Corners sent in on the rd to [facility]. She said that she only got 20 tablets. She said that with the prescription written for every 6 hrs & that it has not lasted. She said that most days she has only taken 2, but that would only have been a 5 day supply. Please advise?

## 2021-01-01 NOTE — Telephone Encounter (Signed)
I called patient to triage her leg swelling. She stated that she has dealt with this before & she is wondering if she may have some fluid retention. Legs are better after elevation, but comes back next day. She does not have any heat or redness to legs. She wondered if Lasix may be beneficial for her? Please advise?  She was more concerned about her knee pain. She stated that she is in such bad pain. She never was able to pick up the Hydrocodone prescribed by Dr. Lajoyce Corners 8/23 that was sent to [facility]. It was initially sent to [facility] & they could fill controlled substances & she didn't know that he had sent to [facility]. She said that she had been to [facility] & they didn't receive the prescription. I tried to explain that for that particular med that she needed to call Dr. Audrie Lia office. She said that she would call their pharmacy again to see of they had. I offered to do this for her as well. She did not want to do that & she stated that she wanted to try the Tramadol instead that you had initially suggested. She said that she was just in so much pain with her knee & she does plan at some point to have the knee replacement surgery. Pt was verbally somewhat hard to understand when discussing this with her. Please advise if you are willing to prescribe the Tramadol. She feels she needs to be on something regularly until she can have surgery.

## 2021-01-02 MED ORDER — TRAMADOL HCL 50 MG PO TABS: 50.0000 mg | ORAL_TABLET | Freq: Every day | ORAL | 1 refills | Status: AC | PRN

## 2021-01-02 NOTE — Telephone Encounter (Signed)
Call pt Triage leg swelling Are both swollen? Any heat, warmth to raise concern for infection? SOB?  Ensure she is on eliquis.  If unilateral or one leg is bigger than the other, she will need to UC TODAY to ensure she doesn't require ultrasound study  If bilateral, advise elevation, low salt and ace wraps which can start under the foot and she can wrap above her knees  For knee pain, she can STOP norco as given by orthopedics  I have sent in tramadol. No alcohol with this  medication and she must store in safe place. It is opioid like medication. She needs f/u EVERY 3 months. Please sch  I looked up patient on [place] Controlled Substances Reporting System PMP AWARE and saw no activity that raised concern of inappropriate use.

## 2021-01-02 NOTE — Telephone Encounter (Signed)
Called to speak with Leah Roberts. Pt requested Hydrocodone medication but it has already been sent into [facility] on [address] on 12/23/20. Left a message to call back.

## 2021-01-02 NOTE — Telephone Encounter (Signed)
FYI patient's leg swelling is bilateral, there is no heat or redness. Swelling does improve some with the elevation, but still persists. She does take her Eliquis daily as prescribed. She will try there Tramadol that was sent to [facility] & will let us know if does not help pain. Pt is trying to wait on her surgery bc she has a trip planned to [place] & she will be in a wedding. She does not want to miss this & will try to have surgery beginning of next year.

## 2021-01-06 NOTE — Telephone Encounter (Signed)
Called to speak with Danija. Westyn states that she is currently only taking the tramadol and that she needs a refill on hydrocodone as well. I informed her that Rennie Plowman did not refill or prescribe the tramadol and that she will have to contact the doctor who gave her the medication. Madylyn verbalized understanding and had no further questions.

## 2021-01-12 ENCOUNTER — Other Ambulatory Visit: Payer: Self-pay

## 2021-01-15 ENCOUNTER — Telehealth: Payer: Self-pay | Admitting: Family

## 2021-01-15 NOTE — Telephone Encounter (Signed)
Patient called requesting financial assistance letter. The letter has been printed and placed in the accordion at the Front.

## 2021-01-20 ENCOUNTER — Telehealth: Payer: Self-pay | Admitting: Family

## 2021-01-20 ENCOUNTER — Other Ambulatory Visit: Payer: Self-pay

## 2021-01-20 NOTE — Telephone Encounter (Signed)
Patient came in to pick up a form and she would also like to know if she is supposed to take her tramadol only when she is in pain or on a regular basis.Please call the patient.

## 2021-01-22 NOTE — Telephone Encounter (Signed)
Patient advised to take tramadol only as needed not dailly.

## 2021-02-02 ENCOUNTER — Other Ambulatory Visit: Payer: Self-pay | Admitting: Family

## 2021-02-02 ENCOUNTER — Other Ambulatory Visit: Payer: Self-pay

## 2021-02-02 MED FILL — Apixaban Tab 5 MG: ORAL | Qty: 60 | Fill #0 | Status: CN

## 2021-02-03 ENCOUNTER — Other Ambulatory Visit: Payer: Self-pay

## 2021-02-05 ENCOUNTER — Other Ambulatory Visit: Payer: Self-pay

## 2021-02-05 MED FILL — Apixaban Tab 5 MG: ORAL | 60 days supply | Qty: 120 | Fill #0 | Status: AC

## 2021-02-06 ENCOUNTER — Other Ambulatory Visit: Payer: Self-pay

## 2021-02-09 ENCOUNTER — Other Ambulatory Visit: Payer: Self-pay

## 2021-02-10 ENCOUNTER — Telehealth: Payer: Self-pay | Admitting: Pharmacist

## 2021-02-10 NOTE — Telephone Encounter (Signed)
--   Leah Roberts - Tuesday, February 10, 2021 9:27 AM --Received a fax from [facility] for the provider to sign a refill request, sending Interoffice to provider Rennie Plowman, NP @ [facility] to sign and return to me.

## 2021-03-10 ENCOUNTER — Other Ambulatory Visit: Payer: Self-pay

## 2021-03-11 ENCOUNTER — Other Ambulatory Visit: Payer: Self-pay | Admitting: Family

## 2021-03-11 ENCOUNTER — Other Ambulatory Visit: Payer: Self-pay

## 2021-03-11 MED ORDER — APIXABAN 5 MG PO TABS
ORAL_TABLET | ORAL | 1 refills | Status: DC
  Filled 2021-03-11: qty 60, fill #0
  Filled 2021-03-23: qty 60, 30d supply, fill #0

## 2021-03-11 MED ORDER — GABAPENTIN 100 MG PO CAPS
100.0000 mg | ORAL_CAPSULE | Freq: Three times a day (TID) | ORAL | 1 refills | Status: DC
  Filled 2021-03-11: qty 90, 30d supply, fill #0

## 2021-03-17 ENCOUNTER — Ambulatory Visit: Payer: No Typology Code available for payment source | Admitting: Orthopedic Surgery

## 2021-03-18 ENCOUNTER — Telehealth: Payer: Self-pay | Admitting: Pharmacist

## 2021-03-18 NOTE — Telephone Encounter (Signed)
03/18/2021 10:48:53 AM - Eliquis pending  -- Rhetta Mura - Wednesday, March 18, 2021 10:47 AM --I have received the signed provider portion of [employer] application for Eliquis--holding for patient's portion--03/12/21 I put in bag with other meds for patient to sign.

## 2021-03-19 ENCOUNTER — Other Ambulatory Visit: Payer: Self-pay

## 2021-03-20 ENCOUNTER — Other Ambulatory Visit: Payer: Self-pay

## 2021-03-23 ENCOUNTER — Other Ambulatory Visit: Payer: Self-pay

## 2021-03-23 ENCOUNTER — Ambulatory Visit (INDEPENDENT_AMBULATORY_CARE_PROVIDER_SITE_OTHER): Payer: No Typology Code available for payment source | Admitting: Orthopedic Surgery

## 2021-03-23 DIAGNOSIS — M1711 Unilateral primary osteoarthritis, right knee: Secondary | ICD-10-CM

## 2021-03-23 MED ORDER — HYDROCODONE-ACETAMINOPHEN 5-325 MG PO TABS: 1.0000 | ORAL_TABLET | ORAL | 0 refills | Status: DC | PRN

## 2021-03-24 ENCOUNTER — Encounter: Payer: Self-pay | Admitting: Orthopedic Surgery

## 2021-03-24 ENCOUNTER — Ambulatory Visit (INDEPENDENT_AMBULATORY_CARE_PROVIDER_SITE_OTHER): Payer: No Typology Code available for payment source | Admitting: Family

## 2021-03-24 ENCOUNTER — Encounter: Payer: Self-pay | Admitting: Family

## 2021-03-24 ENCOUNTER — Other Ambulatory Visit: Payer: Self-pay

## 2021-03-24 VITALS — BP 120/84 | HR 80 | Temp 98.6°F | Wt 221.8 lb

## 2021-03-24 DIAGNOSIS — J40 Bronchitis, not specified as acute or chronic: Secondary | ICD-10-CM

## 2021-03-24 DIAGNOSIS — I2699 Other pulmonary embolism without acute cor pulmonale: Secondary | ICD-10-CM

## 2021-03-24 DIAGNOSIS — R6889 Other general symptoms and signs: Secondary | ICD-10-CM

## 2021-03-24 LAB — POCT RAPID STREP A (OFFICE): Rapid Strep A Screen: NEGATIVE

## 2021-03-24 MED ORDER — ALBUTEROL SULFATE HFA 108 (90 BASE) MCG/ACT IN AERS: 2.0000 | INHALATION_SPRAY | Freq: Four times a day (QID) | RESPIRATORY_TRACT | 0 refills | Status: DC | PRN

## 2021-03-24 MED ORDER — HYDROCOD POLST-CPM POLST ER 10-8 MG/5ML PO SUER: 5.0000 mL | Freq: Every evening | ORAL | 0 refills | Status: DC | PRN

## 2021-03-24 NOTE — Patient Instructions (Addendum)
I have provided you with Tussionex cough syrup as discussed today.  Please do not take this with any other cough medication or pain medication such as the norvo/vicodin which has been provided to you in the past from orthopedics  Use albuterol every 6 hours for first 24 hours to get good medication into the lungs and loosen congestion; after, you may use as needed and eventually stop all together when cough resolves.  Please take cough medication at night only as needed. As we discussed, I do not recommend dosing throughout the day as coughing is a protective mechanism . It also helps to break up thick mucous.  Do not take cough suppressants with alcohol as can lead to trouble breathing. Advise caution if taking cough suppressant and operating machinery ( i.e driving a car) as you may feel very tired.    Let me know how you are doing

## 2021-03-24 NOTE — Progress Notes (Signed)
Office Visit Note   Patient: Leah Roberts           Date of Birth: [birth year]           MRN: [id number] Visit Date: 03/23/2021              Requested by: Burnard Hawthorne, FNP [address] [place],  [place] [address] PCP: Burnard Hawthorne, FNP  Chief Complaint  Patient presents with   Right Knee - Pain   Left Knee - Pain      HPI: Patient is a 70 year old woman who presents in follow-up for osteoarthritis of her right knee.  She currently ambulates with a cane complains of pain worse over the medial joint line denies any specific swelling states she has stiffness with start up and pain at night.  Patient states that she recently had a fall secondary to giving way of her knee.  Assessment & Plan: Visit Diagnoses:  1. Unilateral primary osteoarthritis, right knee     Plan: Recommended Voltaren gel and ice.  Patient states she would like to proceed with a total knee arthroplasty.  She is going to [place] in December and would like to schedule something in February when she returns for a right total knee arthroplasty.  Follow-Up Instructions: Return if symptoms worsen or fail to improve.   Ortho Exam  Patient is alert, oriented, no adenopathy, well-dressed, normal affect, normal respiratory effort. Examination patient ambulates with a cane she does have swelling with an effusion worse in the left knee than the right knee.  She has venous stasis insufficiency of both lower extremities.  Right knee and left knee both have crepitation with range of motion collaterals and cruciates are stable.  Radiographs show tricompartmental osteoarthritis of both knees.  Imaging: No results found. No images are attached to the encounter.  Labs: Lab Results  Component Value Date   HGBA1C 6.2 02/15/2018   HGBA1C 6.2 10/05/2017   HGBA1C 6.3 06/18/2016   ESRSEDRATE 10 05/29/2020   CRP <1.0 05/29/2020     Lab Results  Component Value Date   ALBUMIN 3.9 06/18/2016     No results found for: MG Lab Results  Component Value Date   VD25OH 21.43 (L) 10/05/2017    No results found for: PREALBUMIN CBC EXTENDED Latest Ref Rng & Units 05/29/2020 01/01/2020 12/31/2019  WBC 4.0 - 10.5 K/uL 6.9 7.6 6.2  RBC 3.87 - 5.11 Mil/uL 4.56 4.05 4.43  HGB 12.0 - 15.0 g/dL 12.2 10.7(L) 11.9(L)  HCT 36.0 - 46.0 % 38.0 32.9(L) 37.0  PLT 150.0 - 400.0 K/uL 227.0 222 226  NEUTROABS 1.4 - 7.7 K/uL 3.0 - -  LYMPHSABS 0.7 - 4.0 K/uL 3.4 - -     There is no height or weight on file to calculate BMI.  Orders:  No orders of the defined types were placed in this encounter.  Meds ordered this encounter  Medications   HYDROcodone-acetaminophen (NORCO/VICODIN) 5-325 MG tablet    Sig: Take 1 tablet by mouth every 4 (four) hours as needed for moderate pain.    Dispense:  30 tablet    Refill:  0     Procedures: No procedures performed  Clinical Data: No additional findings.  ROS:  All other systems negative, except as noted in the HPI. Review of Systems  Objective: Vital Signs: There were no vitals taken for this visit.  Specialty Comments:  No specialty comments available.  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   Arthralgia 07/12/2018   Solitary pulmonary nodule 07/12/2018   Neck pain 02/15/2018   Dizziness 02/15/2018   Elevated TSH 02/15/2018   Screening for HIV (human immunodeficiency virus) 10/05/2017   Neuropathy 10/05/2017   Screening for colon cancer 10/05/2017   Hyperlipidemia 08/04/2016   Prediabetes 08/04/2016   Back pain 08/04/2016   Bilateral knee pain 06/18/2016   History of DVT of lower extremity 04/01/2014   Pulmonary  embolism (HCC) 05/04/2011   Past Medical History:  Diagnosis Date   DVT (deep venous thrombosis) (HCC)    DVT (deep venous thrombosis) (HCC) 2013   History of hypertension    Hypertension    Pulmonary embolism (HCC) 2013    Family History  Problem Relation Age of Onset   Hypertension Father    Heart disease Father 28   Thyroid disease Sister    Allergies Sister    CVA Neg Hx    Heart attack Neg Hx    High Cholesterol Neg Hx    Colon cancer Neg Hx     Past Surgical History:  Procedure Laterality Date   APPENDECTOMY     TONSILLECTOMY AND ADENOIDECTOMY     Social History   Occupational History   Not on file  Tobacco Use   Smoking status: Never   Smokeless tobacco: Never  Substance and Sexual Activity   Alcohol use: No   Drug use: No   Sexual activity: Not on file

## 2021-03-24 NOTE — Progress Notes (Signed)
Subjective:    Patient ID: Leah Roberts, female    DOB: 1950/08/18, 70 y.o.   MRN: [id number]  CC: [name] is a 70 y.o. female who presents today for an acute visit.    HPI: Acute visit with chief complaint of productive cough x 4 days, worse at nighttime.  Onset when she traveled to [facility] by airplane and started with ears popping.  Endorses congestion,chest tightness.   No CP, fever, sob, wheezing, facial pain or swelling, vision changes, HA.  Tried OTC cough lozenge, lemon /garlic  She would like to night time cough syrup and prefers to be sedating so she is able to get a good nights rest.  Vaccinated for COVID-19 with recent  booster Compliant with Eliquis 5 mg twice daily.  She avoids NSAIDs. No bleeding. HISTORY:  Past Medical History:  Diagnosis Date   DVT (deep venous thrombosis) (HCC)    DVT (deep venous thrombosis) (HCC) 2013   History of hypertension    Hypertension    Pulmonary embolism (HCC) 2013   Past Surgical History:  Procedure Laterality Date   APPENDECTOMY     TONSILLECTOMY AND ADENOIDECTOMY     Family History  Problem Relation Age of Onset   Hypertension Father    Heart disease Father 49   Thyroid disease Sister    Allergies Sister    CVA Neg Hx    Heart attack Neg Hx    High Cholesterol Neg Hx    Colon cancer Neg Hx     Allergies: Sulfa antibiotics and Sulfa antibiotics Current Outpatient Medications on File Prior to Visit  Medication Sig Dispense Refill   apixaban (ELIQUIS) 5 MG TABS tablet Take 1 tablet (5 mg total) by mouth 2 (two) times daily. 60 tablet 1   gabapentin (NEURONTIN) 100 MG capsule Take 1 capsule (100 mg total) by mouth 3 (three) times daily. 90 capsule 1   HYDROcodone-acetaminophen (NORCO/VICODIN) 5-325 MG tablet Take 1 tablet by mouth every 4 (four) hours as needed for moderate pain. 30 tablet 0   loratadine (CLARITIN) 10 MG tablet Take 10 mg by mouth daily.     No current facility-administered medications on file  prior to visit.    Social History   Tobacco Use   Smoking status: Never   Smokeless tobacco: Never  Substance Use Topics   Alcohol use: No   Drug use: No    Review of Systems  Constitutional:  Negative for chills and fever.  HENT:  Positive for congestion.   Respiratory:  Positive for cough. Negative for shortness of breath and wheezing.   Cardiovascular:  Negative for chest pain and palpitations.  Gastrointestinal:  Negative for nausea and vomiting.     Objective:    BP 120/84 (BP Location: Left Arm, Patient Position: Sitting, Cuff Size: Large)   Pulse 80   Temp 98.6 F (37 C) (Temporal)   Wt 221 lb 12.8 oz (100.6 kg)   SpO2 97%   BMI 36.91 kg/m    Physical Exam Vitals reviewed.  Constitutional:      Appearance: She is well-developed.  HENT:     Head: Normocephalic and atraumatic.     Right Ear: Hearing, tympanic membrane, ear canal and external ear normal. No decreased hearing noted. No drainage, swelling or tenderness. No middle ear effusion. No foreign body. Tympanic membrane is not erythematous or bulging.     Left Ear: Hearing, tympanic membrane, ear canal and external ear normal. No decreased hearing noted. No drainage, swelling  or tenderness.  No middle ear effusion. No foreign body. Tympanic membrane is not erythematous or bulging.     Nose: Nose normal. No rhinorrhea.     Right Sinus: No maxillary sinus tenderness or frontal sinus tenderness.     Left Sinus: No maxillary sinus tenderness or frontal sinus tenderness.     Mouth/Throat:     Pharynx: Uvula midline. No oropharyngeal exudate or posterior oropharyngeal erythema.     Tonsils: No tonsillar abscesses.  Eyes:     Conjunctiva/sclera: Conjunctivae normal.  Cardiovascular:     Rate and Rhythm: Regular rhythm.     Pulses: Normal pulses.     Heart sounds: Normal heart sounds.  Pulmonary:     Effort: Pulmonary effort is normal.     Breath sounds: Normal breath sounds. No wheezing, rhonchi or rales.      Comments: One expiratory wheeze heard on exam Lymphadenopathy:     Head:     Right side of head: No submental, submandibular, tonsillar, preauricular, posterior auricular or occipital adenopathy.     Left side of head: No submental, submandibular, tonsillar, preauricular, posterior auricular or occipital adenopathy.     Cervical: No cervical adenopathy.  Skin:    General: Skin is warm and dry.  Neurological:     Mental Status: She is alert.  Psychiatric:        Speech: Speech normal.        Behavior: Behavior normal.        Thought Content: Thought content normal.       Assessment & Plan:   Problem List Items Addressed This Visit       Cardiovascular and Mediastinum   Pulmonary embolism (HCC)    Chronic, stable.  Continue Eliquis 5 mg twice daily        Respiratory   Bronchitis    Negative strep, flu and COVID point-of-care.   Provided her with albuterol and Tussionex cough syrup for bedtime.  Advised her of the sedation and to store cough syrup in a safe place.I looked up patient on [place] Controlled Substances Reporting System PMP AWARE and saw no activity that raised concern of inappropriate use.  She will let me know if symptoms do not resolve.      Other Visit Diagnoses     Flu-like symptoms    -  Primary   Relevant Medications   chlorpheniramine-HYDROcodone (TUSSIONEX PENNKINETIC ER) 10-8 MG/5ML SUER   albuterol (VENTOLIN HFA) 108 (90 Base) MCG/ACT inhaler   Other Relevant Orders   POC Influenza A/B (Completed)   POC COVID-19 BinaxNow (Completed)   POCT rapid strep A (Completed)         I am having Kendall Deol start on chlorpheniramine-HYDROcodone and albuterol. I am also having her maintain her gabapentin, apixaban, HYDROcodone-acetaminophen, and loratadine.   Meds ordered this encounter  Medications   chlorpheniramine-HYDROcodone (TUSSIONEX PENNKINETIC ER) 10-8 MG/5ML SUER    Sig: Take 5 mLs by mouth at bedtime as needed for cough.    Dispense:   70 mL    Refill:  0    Order Specific Question:   Supervising Provider    Answer:   Duncan Dull L [[id number]]   albuterol (VENTOLIN HFA) 108 (90 Base) MCG/ACT inhaler    Sig: Inhale 2 puffs into the lungs every 6 (six) hours as needed for wheezing or shortness of breath.    Dispense:  8 g    Refill:  0    Order Specific Question:   Supervising Provider  Answer:   Sherlene Shams [[id number]]     Return precautions given.   Risks, benefits, and alternatives of the medications and treatment plan prescribed today were discussed, and patient expressed understanding.   Education regarding symptom management and diagnosis given to patient on AVS.  Continue to follow with Allegra Grana, FNP for routine health maintenance.   Lianah Oralia Rud and I agreed with plan.   Rennie Plowman, FNP

## 2021-03-25 DIAGNOSIS — J40 Bronchitis, not specified as acute or chronic: Secondary | ICD-10-CM | POA: Insufficient documentation

## 2021-03-25 LAB — POC COVID19 BINAXNOW: SARS Coronavirus 2 Ag: NEGATIVE

## 2021-03-25 LAB — POCT INFLUENZA A/B
Influenza A, POC: NEGATIVE
Influenza B, POC: NEGATIVE

## 2021-03-25 NOTE — Assessment & Plan Note (Signed)
Chronic, stable.  Continue Eliquis 5 mg twice daily

## 2021-03-25 NOTE — Assessment & Plan Note (Signed)
Negative strep, flu and COVID point-of-care.   Provided her with albuterol and Tussionex cough syrup for bedtime.  Advised her of the sedation and to store cough syrup in a safe place.I looked up patient on [place] Controlled Substances Reporting System PMP AWARE and saw no activity that raised concern of inappropriate use.  She will let me know if symptoms do not resolve.

## 2021-04-13 ENCOUNTER — Other Ambulatory Visit: Payer: Self-pay

## 2021-04-14 ENCOUNTER — Other Ambulatory Visit: Payer: Self-pay

## 2021-04-14 ENCOUNTER — Other Ambulatory Visit: Payer: Self-pay | Admitting: Family

## 2021-04-14 NOTE — Telephone Encounter (Signed)
Patient is requesting a refill on her Tramadol,  She wants 2 months of the following medication for traveling; gabapentin (NEURONTIN) 100 MG capsule, apixaban (ELIQUIS) 5 MG TABS tablet

## 2021-04-15 ENCOUNTER — Telehealth: Payer: Self-pay | Admitting: Family

## 2021-04-15 ENCOUNTER — Other Ambulatory Visit: Payer: Self-pay

## 2021-04-15 ENCOUNTER — Ambulatory Visit (INDEPENDENT_AMBULATORY_CARE_PROVIDER_SITE_OTHER): Payer: No Typology Code available for payment source | Admitting: *Deleted

## 2021-04-15 DIAGNOSIS — M25562 Pain in left knee: Secondary | ICD-10-CM

## 2021-04-15 DIAGNOSIS — I2699 Other pulmonary embolism without acute cor pulmonale: Secondary | ICD-10-CM

## 2021-04-15 DIAGNOSIS — M545 Low back pain, unspecified: Secondary | ICD-10-CM

## 2021-04-15 DIAGNOSIS — Z23 Encounter for immunization: Secondary | ICD-10-CM

## 2021-04-15 DIAGNOSIS — G8929 Other chronic pain: Secondary | ICD-10-CM

## 2021-04-15 MED ORDER — GABAPENTIN 100 MG PO CAPS
100.0000 mg | ORAL_CAPSULE | Freq: Three times a day (TID) | ORAL | 1 refills | Status: DC
  Filled 2021-04-15: qty 180, 60d supply, fill #0

## 2021-04-15 MED ORDER — APIXABAN 5 MG PO TABS
ORAL_TABLET | ORAL | 1 refills | Status: DC
  Filled 2021-04-15: qty 120, 60d supply, fill #0

## 2021-04-15 NOTE — Telephone Encounter (Signed)
Patient asking for two month supply of tramadol 50 mg for mild pain patient says ortho prescribed hydrocodone for when her pain is severe in her knees she only takes hydrocodone when the tramadol does not help . Patient asking for two month supply due to going out of stated for2 months nurse has sent 2 month supply of Eliquis and Gabapentin

## 2021-04-15 NOTE — Addendum Note (Signed)
Addended by: Elise Benne T on: 04/15/2021 03:40 PM   Modules accepted: Orders

## 2021-04-15 NOTE — Telephone Encounter (Signed)
RX Refill: tramadol Last Seen: 03-24-21 Last Ordered: 01-02-21 Next Appt: NA

## 2021-04-16 ENCOUNTER — Other Ambulatory Visit: Payer: Self-pay

## 2021-04-17 ENCOUNTER — Other Ambulatory Visit: Payer: Self-pay

## 2021-04-17 MED ORDER — TRAMADOL HCL 50 MG PO TABS
50.0000 mg | ORAL_TABLET | Freq: Two times a day (BID) | ORAL | 1 refills | Status: AC | PRN
  Filled 2021-04-17: qty 60, 30d supply, fill #0

## 2021-04-17 MED ORDER — GABAPENTIN 100 MG PO CAPS
100.0000 mg | ORAL_CAPSULE | Freq: Three times a day (TID) | ORAL | 1 refills | Status: DC
  Filled 2021-04-17 – 2021-06-12 (×2): qty 180, 60d supply, fill #0
  Filled 2021-06-15: qty 90, 30d supply, fill #0

## 2021-04-17 MED ORDER — APIXABAN 5 MG PO TABS
ORAL_TABLET | ORAL | 1 refills | Status: DC
  Filled 2021-04-17: qty 120, fill #0
  Filled 2021-06-12: qty 120, 60d supply, fill #0

## 2021-04-17 NOTE — Telephone Encounter (Signed)
Call pt I have refilled tramadol, eliquis and gabapentin  Tramadol is controlled substance so I have prescribed for this next two months 60 tablets in bottle.   I do not prescribe hydrocodone and if she were to get this from orthopedic, please reiterate that she does NOT take this with tramadol     I looked up patient on [place] Controlled Substances Reporting System PMP AWARE and saw no activity that raised concern of inappropriate use.

## 2021-04-17 NOTE — Telephone Encounter (Signed)
LMTCB & let patient know the medications had been filled.

## 2021-05-01 ENCOUNTER — Other Ambulatory Visit: Payer: Self-pay

## 2021-06-04 ENCOUNTER — Telehealth: Payer: Self-pay | Admitting: Family

## 2021-06-05 ENCOUNTER — Telehealth: Payer: Self-pay | Admitting: Family

## 2021-06-05 ENCOUNTER — Encounter: Payer: Self-pay | Admitting: Family

## 2021-06-05 NOTE — Telephone Encounter (Signed)
Call pt   I have completed documentation for upcoming dental appointment. Please see form and fax   Please review with pt so she understands the plan and lets [place] know any questions or clarifications  She is currently on Eliquis 5 mg twice daily.  She is upcoming root canal, cleaning and filling with [facility]  She will need to hold eliquis for 2 days prior to procedure and resume the day after procedure.  For example if surgery is on a Wednesday, her last dose is on a Sunday night. She would resume eliquis as prescribed on Thursday in this example as long as bleeding has stopped and is controlled.   Crtcl > 79

## 2021-06-09 NOTE — Telephone Encounter (Signed)
FYI I spoke with patient who was not completely sure if she was just have root canal or if procedure would be extracting a tooth. I advised that if she was extracting or having a tooth pulled that she SHOULD hold Eliquis for two days. If just general root canal with no chance of blood loss than she SHOULD NOT hold the Eliquis. I asked that she please call [facility] to inquire the exact type of procedure she was to have done. Pt stated that she would call [facility] to verify.

## 2021-06-09 NOTE — Telephone Encounter (Signed)
Call pt Please specify what  type of procedure has upcoming with dentist.   If not a tooth extraction where a lot of blood may be lost due to nature of surgery, she doesn't need to hold eliquis.  Generally a root canal would not need to hold eliquis as less risk of bleeding involved.   Please ensure she understands this.

## 2021-06-10 NOTE — Telephone Encounter (Signed)
noted 

## 2021-06-12 ENCOUNTER — Other Ambulatory Visit: Payer: Self-pay | Admitting: Family

## 2021-06-12 ENCOUNTER — Other Ambulatory Visit: Payer: Self-pay

## 2021-06-12 DIAGNOSIS — I2699 Other pulmonary embolism without acute cor pulmonale: Secondary | ICD-10-CM

## 2021-06-12 NOTE — Telephone Encounter (Signed)
close

## 2021-06-13 MED FILL — Apixaban Tab 5 MG: ORAL | Qty: 120 | Fill #0 | Status: CN

## 2021-06-15 ENCOUNTER — Telehealth: Payer: Self-pay | Admitting: Pharmacist

## 2021-06-15 ENCOUNTER — Other Ambulatory Visit: Payer: Self-pay | Admitting: Family

## 2021-06-15 ENCOUNTER — Other Ambulatory Visit: Payer: Self-pay

## 2021-06-15 MED FILL — Duloxetine HCl Enteric Coated Pellets Cap 30 MG (Base Eq): ORAL | Qty: 60 | Fill #0 | Status: CN

## 2021-06-15 NOTE — Telephone Encounter (Signed)
06/15/2021 9:52:08 AM - Eliquis follow up -- Leah Roberts - Monday, June 15, 2021 9:48 AM --   Received follow up request on Eliquis 5mg . Spoke to [place] at [facility]. Pt missed initial S on page 3 of form. Notified pt. that application can not be processed until we have her complete initial. Pt states she is still in [place] She understands and will come by when she gets back to [place].

## 2021-06-22 ENCOUNTER — Other Ambulatory Visit: Payer: Self-pay

## 2021-07-14 ENCOUNTER — Other Ambulatory Visit: Payer: Self-pay

## 2021-07-21 ENCOUNTER — Other Ambulatory Visit: Payer: Self-pay

## 2021-07-23 ENCOUNTER — Other Ambulatory Visit: Payer: Self-pay

## 2021-07-24 ENCOUNTER — Other Ambulatory Visit: Payer: Self-pay

## 2021-07-24 MED FILL — Apixaban Tab 5 MG: ORAL | 90 days supply | Qty: 180 | Fill #0 | Status: AC

## 2021-09-03 ENCOUNTER — Encounter: Payer: Self-pay | Admitting: Internal Medicine

## 2021-09-03 ENCOUNTER — Telehealth: Payer: Self-pay | Admitting: Family

## 2021-09-03 ENCOUNTER — Ambulatory Visit (INDEPENDENT_AMBULATORY_CARE_PROVIDER_SITE_OTHER): Payer: No Typology Code available for payment source | Admitting: Internal Medicine

## 2021-09-03 VITALS — BP 138/86 | HR 82 | Temp 98.1°F | Resp 14 | Ht 65.0 in | Wt 227.6 lb

## 2021-09-03 DIAGNOSIS — M5416 Radiculopathy, lumbar region: Secondary | ICD-10-CM

## 2021-09-03 DIAGNOSIS — G8929 Other chronic pain: Secondary | ICD-10-CM

## 2021-09-03 DIAGNOSIS — M259 Joint disorder, unspecified: Secondary | ICD-10-CM

## 2021-09-03 DIAGNOSIS — M1710 Unilateral primary osteoarthritis, unspecified knee: Secondary | ICD-10-CM

## 2021-09-03 DIAGNOSIS — R936 Abnormal findings on diagnostic imaging of limbs: Secondary | ICD-10-CM | POA: Insufficient documentation

## 2021-09-03 DIAGNOSIS — M25561 Pain in right knee: Secondary | ICD-10-CM

## 2021-09-03 MED ORDER — HYDROCODONE-ACETAMINOPHEN 5-325 MG PO TABS: 1.0000 | ORAL_TABLET | Freq: Two times a day (BID) | ORAL | 0 refills | Status: DC | PRN

## 2021-09-03 NOTE — Patient Instructions (Addendum)
[place] call scholarship for dental care  ? ?Aspercream with lidocaine  ?Or  ?Voltaren gel  ?Heat/ice  ? ?[employer] for right knee brace  ? ? 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  ?Horizontal tear of the medial meniscus. There is subchondral edema on both  ?sides of the medial compartment which is at least partially secondary to  ?the advanced chondral loss although there may be a component of stress  ?related change related to the meniscal tear. No stress fracture.  ?Grade 1 MCL sprain.  ?Mild/moderate size joint effusion.  ?Dictating Provider Gwyneth Sprout, MD  ?Dictated on 11/16/2020 3:12 PM  ?Signed by Gwyneth Sprout, MD  ?Location [phone]  ? ? ?

## 2021-09-03 NOTE — Telephone Encounter (Signed)
Lft pt vm to call ofc . Thank you! 

## 2021-09-03 NOTE — Progress Notes (Signed)
Chief Complaint  ?Patient presents with  ? Acute Visit  ?  Pt c/o leg pain bilaterally, worse in R than L. Had incident at airport back in December fell off of wheelchair landed or R side of body, hit R leg,shoulder and face. Has hx of DVT was sent to ED in [place] and after started PT with [facility] in [place]. Has been taking tylenol with relief and borrowing meloxicam from relative which is helping.   ? ?F/u  ?B/l knee arthritis in 04/2021 staff was pushing her in a wheelchair and lost control and pt feel over hitting her right knee, face, and shoulder her tooth on the right side was messed up and had to have a root canal and fill a cavity but never came back for cap to fill cant afford no insurance disc [facility] scholarship call and check this would  ? ?B/l knee pain with h/o moderate to severe arthritis in b/l knees and arthritis low back she wants mobic 15 mg qd but advised this interferes with eliquis and do not rec. Pain at times 8/10. She has tried PT in [place] and steroid injections w/o help. Pain worse at night tylenol with minor relief. She has to walk with cane and left knee has to wear a sleeve to walk ?She stopped gabapentin 100 tid and cymbalta 30 mg qd due to not helping with her pain  ? ?Est with ortho Dr. Sharol Given issue is no insurance and due to renew [facility] discount scholarship with [place] as of [id number] given applications today to renew and # to call to renew application  ?can call customer service Friday 8:00 am to 5:00 pm at [phone] or [phone]. ? 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  ?Horizontal tear of the medial meniscus. There is subchondral edema on both  ?sides of the medial compartment which is at least partially secondary to  ?the advanced chondral loss although there may be a component of stress  ?related change related to the meniscal tear. No stress fracture.  ?Grade 1 MCL sprain.   ?Mild/moderate size joint effusion.  ? ?Xray left knee  ?EXAM: ?LEFT KNEE - COMPLETE 4+ VIEW ?  ?COMPARISON:  None. ?  ?FINDINGS: ?There are moderate severe tricompartmental degenerative changes, ?greatest within the medial and patellofemoral compartments. There is ?no acute displaced fracture or dislocation. There is a small joint ?effusion. 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 ?  ?IMPRESSION: ?1. Moderate severe tricompartmental degenerative changes. No acute ?bony abnormality. ?2. Small joint effusion. ?3. 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 ?  ?  ?Electronically Signed ?  By: Constance Holster M.D. ?  On: 05/29/2020 20:52 ? ? ? ?Review of Systems  ?Constitutional:  Negative for weight loss.  ?HENT:  Negative for hearing loss.   ?Eyes:  Negative for blurred vision.  ?Respiratory:  Negative for shortness of breath.   ?Cardiovascular:  Negative for chest pain.  ?Gastrointestinal:  Negative for abdominal pain and blood in stool.  ?Genitourinary:  Negative for dysuria.  ?Musculoskeletal:  Positive for back pain, falls and joint pain.  ?Skin:  Negative for rash.  ?Neurological:  Negative for headaches.  ?Psychiatric/Behavioral:  Negative for depression.   ?Past Medical History:  ?Diagnosis Date  ? DVT (deep venous thrombosis) ([place])   ? DVT (deep venous thrombosis) ([place]) 2013  ? History of hypertension   ? Hypertension   ? Pulmonary embolism ([place]) 2013  ? ?Past Surgical History:  ?Procedure Laterality Date  ?  APPENDECTOMY    ? TONSILLECTOMY AND ADENOIDECTOMY    ? ?Family History  ?Problem Relation Age of Onset  ? Hypertension Father   ? Heart disease Father 66  ? Thyroid disease Sister   ? Allergies Sister   ? CVA Neg Hx   ? Heart attack Neg Hx   ? High Cholesterol Neg Hx   ? Colon cancer Neg Hx   ? ?Social History  ? ?Socioeconomic History  ? Marital status: Widowed  ?  Spouse name: Not on file  ? Number of children: Not on file  ? Years of education: Not on file  ? Highest education level: Not on file  ?Occupational History  ? Not on  file  ?Tobacco Use  ? Smoking status: Never  ? Smokeless tobacco: Never  ?Substance and Sexual Activity  ? Alcohol use: No  ? Drug use: No  ? Sexual activity: Not on file  ?Other Topics Concern  ? Not on file  ?Social History Narrative  ? ** Merged History Encounter **  ?    ? ?Social Determinants of Health  ? ?Financial Resource Strain: Not on file  ?Food Insecurity: Not on file  ?Transportation Needs: Not on file  ?Physical Activity: Not on file  ?Stress: Not on file  ?Social Connections: Not on file  ?Intimate Partner Violence: Not on file  ? ?Current Meds  ?Medication Sig  ? albuterol (VENTOLIN HFA) 108 (90 Base) MCG/ACT inhaler Inhale 2 puffs into the lungs every 6 (six) hours as needed for wheezing or shortness of breath.  ? apixaban (ELIQUIS) 5 MG TABS tablet Take 1 tablet (5 mg total) by mouth 2 (two) times daily.  ? loratadine (CLARITIN) 10 MG tablet Take 10 mg by mouth daily.  ? [DISCONTINUED] HYDROcodone-acetaminophen (NORCO) 5-325 MG tablet Take 1 tablet by mouth 2 (two) times daily as needed for moderate pain.  ? ?Allergies  ?Allergen Reactions  ? Sulfa Antibiotics Swelling and Rash  ? Sulfa Antibiotics Rash  ? ?No results found for this or any previous visit (from the past 2160 hour(s)). ?Objective  ?Body mass index is 37.87 kg/m?. ?Wt Readings from Last 3 Encounters:  ?09/03/21 227 lb 9.6 oz (103.2 kg)  ?03/24/21 221 lb 12.8 oz (100.6 kg)  ?12/22/20 222 lb (100.7 kg)  ? ?Temp Readings from Last 3 Encounters:  ?09/03/21 98.1 ?F (36.7 ?C) (Oral)  ?03/24/21 98.6 ?F (37 ?C) (Temporal)  ?12/22/20 98 ?F (36.7 ?C)  ? ?BP Readings from Last 3 Encounters:  ?09/03/21 138/86  ?03/24/21 120/84  ?12/22/20 114/76  ? ?Pulse Readings from Last 3 Encounters:  ?09/03/21 82  ?03/24/21 80  ?12/22/20 95  ? ? ?Physical Exam ?Vitals and nursing note reviewed.  ?Constitutional:   ?   Appearance: Normal appearance. She is well-developed and well-groomed.  ?HENT:  ?   Head: Normocephalic and atraumatic.  ?Eyes:  ?    Conjunctiva/sclera: Conjunctivae normal.  ?   Pupils: Pupils are equal, round, and reactive to light.  ?Cardiovascular:  ?   Rate and Rhythm: Normal rate and regular rhythm.  ?   Heart sounds: Normal heart sounds. No murmur heard. ?Pulmonary:  ?   Effort: Pulmonary effort is normal.  ?   Breath sounds: Normal breath sounds.  ?Abdominal:  ?   General: Abdomen is flat. Bowel sounds are normal.  ?   Tenderness: There is no abdominal tenderness.  ?Musculoskeletal:  ?   Right knee: Tenderness present over the medial joint line, lateral joint line and  MCL.  ?   Left knee: Tenderness present over the medial joint line, lateral joint line and MCL.  ?Skin: ?   General: Skin is warm and dry.  ?Neurological:  ?   General: No focal deficit present.  ?   Mental Status: She is alert and oriented to person, place, and time. Mental status is at baseline.  ?   Cranial Nerves: Cranial nerves 2-12 are intact.  ?   Motor: Motor function is intact.  ?   Coordination: Coordination is intact.  ?   Gait: Gait is intact.  ?   Comments: Walking with cane today ?  ?Psychiatric:     ?   Attention and Perception: Attention and perception normal.     ?   Mood and Affect: Mood and affect normal.     ?   Speech: Speech normal.     ?   Behavior: Behavior normal. Behavior is cooperative.     ?   Thought Content: Thought content normal.     ?   Cognition and Memory: Cognition and memory normal.     ?   Judgment: Judgment normal.  ? ? ?Assessment  ?Plan  ?Chronic pain of right knee and b/l knee arthritis and low back arthritis - Plan: HYDROcodone-acetaminophen (NORCO) 5-325 MG tablet bid prn Ambulatory referral to Orthopedic Surgery Dr. Sharol Given ?MR Knee Right Wo Contrast ?Will need to renew [facility] application before can do MRI reviewed with pt today  ?No mobic 15 mg due to on eliquis ? ?Aspercream with lidocaine  ?Or  ?Voltaren gel  ?Heat/ice  ? ?[employer] Plus for right knee brace  ? ?Arthritis of knee - Plan: HYDROcodone-acetaminophen ([place]) 5-325 MG  tablet, Ambulatory referral to  ? ?Shoulder disorder right and left - Plan: HYDROcodone-acetaminophen (NORCO) 5-325 MG tablet, Ambulatory referral to Orthopedic Surgery above ? ? ?Lumbar radiculopathy - Plan: HYDROco

## 2021-09-04 ENCOUNTER — Other Ambulatory Visit: Payer: Self-pay

## 2021-09-08 ENCOUNTER — Telehealth: Payer: Self-pay | Admitting: Family

## 2021-09-08 NOTE — Telephone Encounter (Signed)
[facility] called stating pt only got ten tablets because she is opioid naive. [facility] need another prescription for the 20 HYDROcodone-acetaminophen pills ?

## 2021-09-08 NOTE — Telephone Encounter (Signed)
Leah Roberts calling in to get an update from below note... Leah requesting callback...  ?

## 2021-09-09 ENCOUNTER — Other Ambulatory Visit: Payer: Self-pay | Admitting: Internal Medicine

## 2021-09-09 ENCOUNTER — Other Ambulatory Visit: Payer: Self-pay

## 2021-09-09 DIAGNOSIS — M1710 Unilateral primary osteoarthritis, unspecified knee: Secondary | ICD-10-CM

## 2021-09-09 DIAGNOSIS — M259 Joint disorder, unspecified: Secondary | ICD-10-CM

## 2021-09-09 DIAGNOSIS — R936 Abnormal findings on diagnostic imaging of limbs: Secondary | ICD-10-CM

## 2021-09-09 DIAGNOSIS — M5416 Radiculopathy, lumbar region: Secondary | ICD-10-CM

## 2021-09-09 DIAGNOSIS — G8929 Other chronic pain: Secondary | ICD-10-CM

## 2021-09-09 MED ORDER — HYDROCODONE-ACETAMINOPHEN 5-325 MG PO TABS: 1.0000 | ORAL_TABLET | Freq: Two times a day (BID) | ORAL | 0 refills | Status: DC | PRN

## 2021-09-09 NOTE — Telephone Encounter (Signed)
Melissa called from [facility] to follow-up on message she left for [place] yesterday.  Melissa states patient is out of medicine.  Please call her at [phone]. ?

## 2021-09-09 NOTE — Telephone Encounter (Signed)
Dr Shirlee Latch,  ?Sending to you since you saw her ? ?Jenate,  ? ?Can you sch a f/u with pt to see me? ?

## 2021-09-09 NOTE — Telephone Encounter (Signed)
Call [facility] she is not opiate naive I gave her the same quantity that ortho Dr. Lajoyce Corners gave her in 03/2021  ?My original Rx stated 30 pills why do I need to send another rx for 20?  ? ?

## 2021-09-11 ENCOUNTER — Ambulatory Visit
Admission: RE | Admit: 2021-09-11 | Discharge: 2021-09-11 | Disposition: A | Discharge status: Home / Self Care | Payer: Self-pay | Source: Ambulatory Visit | Attending: Internal Medicine | Admitting: Internal Medicine

## 2021-09-11 DIAGNOSIS — M25561 Pain in right knee: Secondary | ICD-10-CM | POA: Insufficient documentation

## 2021-09-11 DIAGNOSIS — G8929 Other chronic pain: Secondary | ICD-10-CM | POA: Insufficient documentation

## 2021-09-11 DIAGNOSIS — R936 Abnormal findings on diagnostic imaging of limbs: Secondary | ICD-10-CM | POA: Insufficient documentation

## 2021-09-11 DIAGNOSIS — M1710 Unilateral primary osteoarthritis, unspecified knee: Secondary | ICD-10-CM | POA: Insufficient documentation

## 2021-09-11 NOTE — Telephone Encounter (Signed)
noted 

## 2021-09-15 ENCOUNTER — Ambulatory Visit: Payer: No Typology Code available for payment source | Admitting: Orthopedic Surgery

## 2021-09-15 ENCOUNTER — Telehealth: Payer: Self-pay | Admitting: Pharmacy Technician

## 2021-09-15 NOTE — Telephone Encounter (Signed)
Patient still needs to provide 2022 Federal Tax Return and current bank statement. ? ?Leah Roberts ?Care Manager ?[facility] ?

## 2021-09-22 ENCOUNTER — Ambulatory Visit: Payer: No Typology Code available for payment source | Admitting: Orthopedic Surgery

## 2021-09-24 ENCOUNTER — Other Ambulatory Visit: Payer: Self-pay

## 2021-09-24 ENCOUNTER — Ambulatory Visit (INDEPENDENT_AMBULATORY_CARE_PROVIDER_SITE_OTHER): Payer: Self-pay | Admitting: Orthopedic Surgery

## 2021-09-24 ENCOUNTER — Encounter: Payer: Self-pay | Admitting: Orthopedic Surgery

## 2021-09-24 ENCOUNTER — Emergency Department: Payer: No Typology Code available for payment source

## 2021-09-24 ENCOUNTER — Emergency Department
Admission: EM | Admit: 2021-09-24 | Discharge: 2021-09-24 | Disposition: A | Discharge status: Home / Self Care | Payer: No Typology Code available for payment source | Attending: Emergency Medicine | Admitting: Emergency Medicine

## 2021-09-24 DIAGNOSIS — Z86718 Personal history of other venous thrombosis and embolism: Secondary | ICD-10-CM

## 2021-09-24 DIAGNOSIS — M1711 Unilateral primary osteoarthritis, right knee: Secondary | ICD-10-CM

## 2021-09-24 DIAGNOSIS — Z7901 Long term (current) use of anticoagulants: Secondary | ICD-10-CM | POA: Insufficient documentation

## 2021-09-24 DIAGNOSIS — M79605 Pain in left leg: Secondary | ICD-10-CM | POA: Insufficient documentation

## 2021-09-24 DIAGNOSIS — I10 Essential (primary) hypertension: Secondary | ICD-10-CM | POA: Insufficient documentation

## 2021-09-24 DIAGNOSIS — I82409 Acute embolism and thrombosis of unspecified deep veins of unspecified lower extremity: Secondary | ICD-10-CM | POA: Insufficient documentation

## 2021-09-24 MED ORDER — CYCLOBENZAPRINE HCL 5 MG PO TABS
5.0000 mg | ORAL_TABLET | Freq: Three times a day (TID) | ORAL | 0 refills | Status: DC | PRN
  Filled 2021-09-24: qty 12, 4d supply, fill #0

## 2021-09-24 MED ORDER — LIDOCAINE 5 % EX PTCH
1.0000 | MEDICATED_PATCH | Freq: Two times a day (BID) | CUTANEOUS | 0 refills | Status: DC
  Filled 2021-09-24: qty 10, 5d supply, fill #0

## 2021-09-24 NOTE — ED Notes (Signed)
See triage note  presents with pain to left leg.  States her pain starts at ankle/lower leg and moves up her leg  and is also having pain to left hip area  ambulates slowly with walker

## 2021-09-24 NOTE — Progress Notes (Signed)
Office Visit Note   Patient: Leah Roberts           Date of Birth: 1950/05/24           MRN: [id number] Visit Date: 09/24/2021              Requested by: McLean-Scocuzza, Pasty Spillers, MD [address] [place],  [place] [address] PCP: Allegra Grana, FNP  Chief Complaint  Patient presents with   Right Knee - Pain   Left Knee - Pain      HPI: Patient is a 71 year old woman who presents in follow-up for osteoarthritis both knees she states her pain is worse in the right knee at this time.  Patient states that the right knee pain was exacerbated by [place] the airport striking her right side including the right knee.  Patient has had no relief with previous injections or therapy.  She currently takes Tylenol for pain she is on Eliquis for history of a DVT and pulmonary embolus.  Assessment & Plan: Visit Diagnoses:  1. Unilateral primary osteoarthritis, right knee     Plan: Discussed treatment options including continued conservative therapy versus arthroscopic intervention versus total knee arthroplasty.  Patient states she would like to proceed with a total knee arthroplasty.  Risk and benefits were discussed including infection neurovascular injury persistent pain DVT need for additional surgery.  Patient states she understands wished to proceed at this time recommend that she start strength training at this time recommend that she see if she could take anti-inflammatories short-term to help with the pain relief and recommended stopping her narcotic pain medicine at this time as well.  Plan for outpatient total knee arthroplasty.  Follow-Up Instructions: Return if symptoms worsen or fail to improve.   Ortho Exam  Patient is alert, oriented, no adenopathy, well-dressed, normal affect, normal respiratory effort. Examination patient ambulates with a walker she has crepitation with range of motion of the right knee there is swelling there is no cellulitis.   Collaterals and cruciates are stable MRI scan shows a complex tear of the medial meniscus posteriorly.  Radiograph shows tricompartmental arthritic changes with joint space narrowing subluxation and bony spurs in all 3 compartments.  Imaging: No results found. No images are attached to the encounter.  Labs: Lab Results  Component Value Date   HGBA1C 6.2 02/15/2018   HGBA1C 6.2 10/05/2017   HGBA1C 6.3 06/18/2016   ESRSEDRATE 10 05/29/2020   CRP <1.0 05/29/2020     Lab Results  Component Value Date   ALBUMIN 3.9 06/18/2016    No results found for: MG Lab Results  Component Value Date   VD25OH 21.43 (L) 10/05/2017    No results found for: PREALBUMIN    Latest Ref Rng & Units 05/29/2020    2:45 PM 01/01/2020    2:36 AM 12/31/2019   12:43 PM  CBC EXTENDED  WBC 4.0 - 10.5 K/uL 6.9   7.6   6.2    RBC 3.87 - 5.11 Mil/uL 4.56   4.05   4.43    Hemoglobin 12.0 - 15.0 g/dL 41.9   62.2   29.7    HCT 36.0 - 46.0 % 38.0   32.9   37.0    Platelets 150.0 - 400.0 K/uL 227.0   222   226    NEUT# 1.4 - 7.7 K/uL 3.0      Lymph# 0.7 - 4.0 K/uL 3.4         There  is no height or weight on file to calculate BMI.  Orders:  No orders of the defined types were placed in this encounter.  No orders of the defined types were placed in this encounter.    Procedures: No procedures performed  Clinical Data: No additional findings.  ROS:  All other systems negative, except as noted in the HPI. Review of Systems  Objective: Vital Signs: There were no vitals taken for this visit.  Specialty Comments:  No specialty comments available.  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   Arthralgia 07/12/2018   Solitary pulmonary nodule 07/12/2018   Neck pain 02/15/2018   Dizziness 02/15/2018   Elevated TSH 02/15/2018   Screening for HIV (human immunodeficiency virus) 10/05/2017   Neuropathy 10/05/2017   Screening for colon cancer 10/05/2017   Hyperlipidemia 08/04/2016   Prediabetes 08/04/2016   Back pain 08/04/2016   Bilateral knee pain 06/18/2016   History of DVT of lower extremity 04/01/2014   Pulmonary embolism (HCC) 05/04/2011   Past Medical History:  Diagnosis Date   DVT (deep venous thrombosis) (HCC)    DVT (deep venous thrombosis) (HCC) 2013   History of hypertension    Hypertension    Pulmonary embolism (HCC) 2013    Family History  Problem Relation Age of Onset   Hypertension Father    Heart disease Father 66   Thyroid disease Sister    Allergies Sister    CVA Neg Hx    Heart attack Neg Hx    High Cholesterol Neg Hx    Colon cancer Neg Hx     Past Surgical History:  Procedure Laterality Date   APPENDECTOMY     TONSILLECTOMY AND ADENOIDECTOMY     Social History   Occupational History   Not on file  Tobacco Use   Smoking status: Never   Smokeless tobacco: Never  Substance and Sexual Activity   Alcohol use: No   Drug use: No   Sexual activity: Not on file

## 2021-09-24 NOTE — ED Triage Notes (Signed)
Patient to ER via POV with complaints of left lower leg pain, reports pain starts in her ankle and radiates into her hip; describes it as "pinching" and "shooting". States the pain started 3 days ago. Hx of DVT. Patient reports being compliant with her prescribed eliquis. States that she recently travelled to [facility] via plane, and was without her eliquis for 2 days. Denies SHOB.

## 2021-09-24 NOTE — ED Provider Notes (Signed)
[facility] Provider Note    Event Date/Time   First MD Initiated Contact with Patient 09/24/21 1547     (approximate)   History   Chief Complaint Leg Pain   HPI  Leah Roberts is a 71 y.o. female with past medical history of hypertension, hyperlipidemia, and DVT/PE on Eliquis who presents to the ED complaining of leg pain.  Patient reports that she has had about 5 days of pain and swelling in her left leg.  She describes it as a sharp pain that primarily affects the lateral portion of her lower leg just above the ankle.  She has a separate area of discomfort coming along the left upper part of her leg, just below her buttock.  She denies any falls or recent trauma, is primarily concerned about a recurrent DVT as she missed 2 days of her Eliquis last week.  She states she has been back on the Eliquis since then and she denies any chest pain or shortness of breath.     Physical Exam   Triage Vital Signs: ED Triage Vitals  Enc Vitals Group     BP 09/24/21 1527 131/87     Pulse Rate 09/24/21 1527 70     Resp 09/24/21 1527 18     Temp 09/24/21 1527 97.6 F (36.4 C)     Temp src --      SpO2 09/24/21 1527 95 %     Weight 09/24/21 1525 227 lb 9.6 oz (103.2 kg)     Height 09/24/21 1525 5\' 5"  (1.651 m)     Head Circumference --      Peak Flow --      Pain Score 09/24/21 1525 8     Pain Loc --      Pain Edu? --      Excl. in GC? --     Most recent vital signs: Vitals:   09/24/21 1527  BP: 131/87  Pulse: 70  Resp: 18  Temp: 97.6 F (36.4 C)  SpO2: 95%    Constitutional: Alert and oriented. Eyes: Conjunctivae are normal. Head: Atraumatic. Nose: No congestion/rhinnorhea. Mouth/Throat: Mucous membranes are moist.  Cardiovascular: Normal rate, regular rhythm. Grossly normal heart sounds.  2+ radial pulses bilaterally.  2+ DP pulses bilaterally. Respiratory: Normal respiratory effort.  No retractions. Lungs CTAB. Gastrointestinal: Soft and  nontender. No distention. Musculoskeletal: Trace edema to lateral portion of left lower leg with associated tenderness, no erythema or warmth noted. Neurologic:  Normal speech and language. No gross focal neurologic deficits are appreciated.    ED Results / Procedures / Treatments   Labs (all labs ordered are listed, but only abnormal results are displayed) Labs Reviewed - No data to display  RADIOLOGY Left lower extremity ultrasound reviewed and interpreted by me with appropriate compressibility of veins, and no findings to suggest DVT.  PROCEDURES:  Critical Care performed: No  Procedures   MEDICATIONS ORDERED IN ED: Medications - No data to display   IMPRESSION / MDM / ASSESSMENT AND PLAN / ED COURSE  I reviewed the triage vital signs and the nursing notes.                              71 y.o. female with past medical history of hypertension, hyperlipidemia, DVT/PE on Eliquis who presents to the ED complaining of left lower leg pain and swelling for the past 5 days after she missed 2 days of  her anticoagulation.  Differential diagnosis includes, but is not limited to, DVT, cellulitis, arterial insufficiency, muscle strain, lumbar radiculopathy.  Patient well-appearing and in no acute distress, vital signs are unremarkable and she is neurovascular intact to her distal left lower extremity.  Ultrasound is negative for DVT and there are no findings to suggest infectious process.  Symptoms likely due to muscle strain versus lumbar radiculopathy and patient is appropriate for outpatient management with muscle relaxant and Lidoderm patch.  She was counseled to follow-up with her PCP and to return to the ED for new worsening symptoms, patient agrees with plan.      FINAL CLINICAL IMPRESSION(S) / ED DIAGNOSES   Final diagnoses:  Left leg pain  History of DVT (deep vein thrombosis)     Rx / DC Orders   ED Discharge Orders     None        Note:  This document was  prepared using Dragon voice recognition software and may include unintentional dictation errors.   Chesley Noon, MD 09/24/21 [phone]

## 2021-09-30 ENCOUNTER — Other Ambulatory Visit: Payer: Self-pay

## 2021-10-06 ENCOUNTER — Other Ambulatory Visit: Payer: Self-pay

## 2021-10-15 ENCOUNTER — Encounter: Payer: Self-pay | Admitting: Orthopedic Surgery

## 2021-10-15 ENCOUNTER — Ambulatory Visit (INDEPENDENT_AMBULATORY_CARE_PROVIDER_SITE_OTHER): Payer: Self-pay | Admitting: Orthopedic Surgery

## 2021-10-15 DIAGNOSIS — M5416 Radiculopathy, lumbar region: Secondary | ICD-10-CM

## 2021-10-15 DIAGNOSIS — G8929 Other chronic pain: Secondary | ICD-10-CM

## 2021-10-15 DIAGNOSIS — M1710 Unilateral primary osteoarthritis, unspecified knee: Secondary | ICD-10-CM

## 2021-10-15 DIAGNOSIS — M1712 Unilateral primary osteoarthritis, left knee: Secondary | ICD-10-CM

## 2021-10-15 DIAGNOSIS — M25561 Pain in right knee: Secondary | ICD-10-CM

## 2021-10-15 DIAGNOSIS — R936 Abnormal findings on diagnostic imaging of limbs: Secondary | ICD-10-CM

## 2021-10-15 DIAGNOSIS — M259 Joint disorder, unspecified: Secondary | ICD-10-CM

## 2021-10-15 MED ORDER — HYDROCODONE-ACETAMINOPHEN 5-325 MG PO TABS: 1.0000 | ORAL_TABLET | Freq: Two times a day (BID) | ORAL | 0 refills | Status: DC | PRN

## 2021-10-15 NOTE — Progress Notes (Signed)
Office Visit Note   Patient: Leah Roberts           Date of Birth: 11/08/1950           MRN: [id number] Visit Date: 10/15/2021              Requested by: No referring provider defined for this encounter. PCP: Pcp, No  Chief Complaint  Patient presents with   Left Knee - Pain      HPI: Patient is a 71 year old woman who has osteoarthritis both knees.  She states that her left knee has progressively gotten worse to the point where she cannot stand on her left knee.  She has similar symptoms in the right knee.  Patient states she recently went to the emergency room for increasing knee pain.  She states ultrasound was negative for DVT.  Assessment & Plan: Visit Diagnoses:  1. Unilateral primary osteoarthritis, left knee   2. Arthritis of knee   3. Shoulder disorder   4. Lumbar radiculopathy   5. Abnormal MRI, knee   6. Chronic pain of right knee     Plan: Prescription for Vicodin is called in for pain patient states she would like to proceed with total knee arthroplasty on the left risk and benefits were discussed including infection neurovascular injury persistent pain need for additional surgery.  Patient states she understands wished to proceed.  Patient would like to discharge postoperative day 1.  Follow-Up Instructions: Return in about 2 weeks (around 10/29/2021).   Ortho Exam  Patient is alert, oriented, no adenopathy, well-dressed, normal affect, normal respiratory effort. Examination patient has effusion of both knees causing cruciates are stable range of motion 10 to 90 degrees.  Radiographs of both knees show tricompartmental arthritic changes with varus alignment bone-on-bone contact medial joint line with osteophytic bone spurs in all 3 compartments.  Imaging: No results found. No images are attached to the encounter.  Labs: Lab Results  Component Value Date   HGBA1C 6.2 02/15/2018   HGBA1C 6.2 10/05/2017   HGBA1C 6.3 06/18/2016   ESRSEDRATE 10  05/29/2020   CRP <1.0 05/29/2020     Lab Results  Component Value Date   ALBUMIN 3.9 06/18/2016    No results found for: "MG" Lab Results  Component Value Date   VD25OH 21.43 (L) 10/05/2017    No results found for: "PREALBUMIN"    Latest Ref Rng & Units 05/29/2020    2:45 PM 01/01/2020    2:36 AM 12/31/2019   12:43 PM  CBC EXTENDED  WBC 4.0 - 10.5 K/uL 6.9  7.6  6.2   RBC 3.87 - 5.11 Mil/uL 4.56  4.05  4.43   Hemoglobin 12.0 - 15.0 g/dL 32.6  71.2  45.8   HCT 36.0 - 46.0 % 38.0  32.9  37.0   Platelets 150.0 - 400.0 K/uL 227.0  222  226   NEUT# 1.4 - 7.7 K/uL 3.0     Lymph# 0.7 - 4.0 K/uL 3.4        There is no height or weight on file to calculate BMI.  Orders:  No orders of the defined types were placed in this encounter.  Meds ordered this encounter  Medications   HYDROcodone-acetaminophen (NORCO) 5-325 MG tablet    Sig: Take 1 tablet by mouth 2 (two) times daily as needed for moderate pain.    Dispense:  20 tablet    Refill:  0     Procedures: No procedures performed  Clinical Data: No  additional findings.  ROS:  All other systems negative, except as noted in the HPI. Review of Systems  Objective: Vital Signs: There were no vitals taken for this visit.  Specialty Comments:  No specialty comments available.  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   Arthralgia 07/12/2018   Solitary pulmonary nodule 07/12/2018   Neck pain 02/15/2018    Dizziness 02/15/2018   Elevated TSH 02/15/2018   Screening for HIV (human immunodeficiency virus) 10/05/2017   Neuropathy 10/05/2017   Screening for colon cancer 10/05/2017   Hyperlipidemia 08/04/2016   Prediabetes 08/04/2016   Back pain 08/04/2016   Bilateral knee pain 06/18/2016   History of DVT of lower extremity 04/01/2014   Pulmonary embolism (HCC) 05/04/2011   Past Medical History:  Diagnosis Date   DVT (deep venous thrombosis) (HCC)    DVT (deep venous thrombosis) (HCC) 2013   History of hypertension    Hypertension    Pulmonary embolism (HCC) 2013    Family History  Problem Relation Age of Onset   Hypertension Father    Heart disease Father 73   Thyroid disease Sister    Allergies Sister    CVA Neg Hx    Heart attack Neg Hx    High Cholesterol Neg Hx    Colon cancer Neg Hx     Past Surgical History:  Procedure Laterality Date   APPENDECTOMY     TONSILLECTOMY AND ADENOIDECTOMY     Social History   Occupational History   Not on file  Tobacco Use   Smoking status: Never   Smokeless tobacco: Never  Substance and Sexual Activity   Alcohol use: No   Drug use: No   Sexual activity: Not on file

## 2021-10-19 ENCOUNTER — Other Ambulatory Visit: Payer: Self-pay

## 2021-10-21 NOTE — Pre-Procedure Instructions (Signed)
Surgical Instructions    Your procedure is scheduled on October 30, 2021.  Report to [facility] Main Entrance "A" at 5:30 A.M., then check in with the Admitting office.  Call this number if you have problems the morning of surgery:  [phone]   If you have any questions prior to your surgery date call [phone]: Open Monday-Friday 8am-4pm    Remember:  Do not eat after midnight the night before your surgery  You may drink clear liquids until 4:30 AM the morning of your surgery.   Clear liquids allowed are: Water, Non-Citrus Juices (without pulp), Carbonated Beverages, Clear Tea, Black Coffee Only (NO MILK, CREAM OR POWDERED CREAMER of any kind), and Gatorade.    Take these medicines the morning of surgery with A SIP OF WATER:  loratadine (CLARITIN)    Take these medicines the morning of surgery AS NEEDED:  HYDROcodone-acetaminophen (NORCO)  As of today, STOP taking any Aspirin (unless otherwise instructed by your surgeon) Aleve, Naproxen, Ibuprofen, Motrin, Advil, Goody's, BC's, all herbal medications, fish oil, and all vitamins.                     Do NOT Smoke (Tobacco/Vaping) for 24 hours prior to your procedure.  If you use a CPAP at night, you may bring your mask/headgear for your overnight stay.   Contacts, glasses, piercing's, hearing aid's, dentures or partials may not be worn into surgery, please bring cases for these belongings.    For patients admitted to the hospital, discharge time will be determined by your treatment team.   Patients discharged the day of surgery will not be allowed to drive home, and someone needs to stay with them for 24 hours.  SURGICAL WAITING ROOM VISITATION Patients having surgery or a procedure may have two support people in the waiting room. These visitors may be switched out with other visitors if needed. Children under the age of 76 must have an adult accompany them who is not the patient. If the patient needs to stay at the hospital  during part of their recovery, the visitor guidelines for inpatient rooms apply.  Please refer to the [facility] website for the visitor guidelines for Inpatients (after your surgery is over and you are in a regular room).    Special instructions:   [place]- Preparing For Surgery  Before surgery, you can play an important role. Because skin is not sterile, your skin needs to be as free of germs as possible. You can reduce the number of germs on your skin by washing with CHG (chlorahexidine gluconate) Soap before surgery.  CHG is an antiseptic cleaner which kills germs and bonds with the skin to continue killing germs even after washing.    Oral Hygiene is also important to reduce your risk of infection.  Remember - BRUSH YOUR TEETH THE MORNING OF SURGERY WITH YOUR REGULAR TOOTHPASTE  Please do not use if you have an allergy to CHG or antibacterial soaps. If your skin becomes reddened/irritated stop using the CHG.  Do not shave (including legs and underarms) for at least 48 hours prior to first CHG shower. It is OK to shave your face.  Please follow these instructions carefully.   Shower the NIGHT BEFORE SURGERY and the MORNING OF SURGERY  If you chose to wash your hair, wash your hair first as usual with your normal shampoo.  After you shampoo, rinse your hair and body thoroughly to remove the shampoo.  Use CHG Soap as you  would any other liquid soap. You can apply CHG directly to the skin and wash gently with a scrungie or a clean washcloth.   Apply the CHG Soap to your body ONLY FROM THE NECK DOWN.  Do not use on open wounds or open sores. Avoid contact with your eyes, ears, mouth and genitals (private parts). Wash Face and genitals (private parts)  with your normal soap.   Wash thoroughly, paying special attention to the area where your surgery will be performed.  Thoroughly rinse your body with warm water from the neck down.  DO NOT shower/wash with your normal soap after using  and rinsing off the CHG Soap.  Pat yourself dry with a CLEAN TOWEL.  Wear CLEAN PAJAMAS to bed the night before surgery  Place CLEAN SHEETS on your bed the night before your surgery  DO NOT SLEEP WITH PETS.   Day of Surgery: Take a shower with CHG soap.  Do not wear jewelry or makeup Do not wear lotions, powders, perfumes/colognes, or deodorant. Do not shave 48 hours prior to surgery.   Do not bring valuables to the hospital.  [facility] is not responsible for any belongings or valuables. Do not wear nail polish, gel polish, artificial nails, or any other type of covering on natural nails (fingers and toes) If you have artificial nails or gel coating that need to be removed by a nail salon, please have this removed prior to surgery. Artificial nails or gel coating may interfere with anesthesia's ability to adequately monitor your vital signs.  Wear Clean/Comfortable clothing the morning of surgery  Remember to brush your teeth WITH YOUR REGULAR TOOTHPASTE.   Please read over the following fact sheets that you were given.    If you received a COVID test during your pre-op visit  it is requested that you wear a mask when out in public, stay away from anyone that may not be feeling well and notify your surgeon if you develop symptoms. If you have been in contact with anyone that has tested positive in the last 10 days please notify you surgeon.

## 2021-10-22 ENCOUNTER — Encounter (HOSPITAL_COMMUNITY): Payer: Self-pay

## 2021-10-22 ENCOUNTER — Other Ambulatory Visit: Payer: Self-pay

## 2021-10-22 ENCOUNTER — Encounter (HOSPITAL_COMMUNITY): Payer: Self-pay | Admitting: Physician Assistant

## 2021-10-22 ENCOUNTER — Telehealth: Payer: Self-pay | Admitting: Orthopedic Surgery

## 2021-10-22 ENCOUNTER — Telehealth: Payer: Self-pay | Admitting: Family

## 2021-10-22 ENCOUNTER — Encounter (HOSPITAL_COMMUNITY)
Admission: RE | Admit: 2021-10-22 | Discharge: 2021-10-22 | Disposition: A | Discharge status: Home / Self Care | Payer: Self-pay | Source: Ambulatory Visit | Attending: Orthopedic Surgery | Admitting: Orthopedic Surgery

## 2021-10-22 VITALS — BP 140/82 | HR 78 | Temp 98.8°F | Resp 17 | Ht 65.0 in | Wt 225.3 lb

## 2021-10-22 DIAGNOSIS — I2510 Atherosclerotic heart disease of native coronary artery without angina pectoris: Secondary | ICD-10-CM | POA: Insufficient documentation

## 2021-10-22 DIAGNOSIS — Z01818 Encounter for other preprocedural examination: Secondary | ICD-10-CM | POA: Insufficient documentation

## 2021-10-22 HISTORY — DX: Dyspnea, unspecified: R06.00

## 2021-10-22 HISTORY — DX: Prediabetes: R73.03

## 2021-10-22 HISTORY — DX: Gastro-esophageal reflux disease without esophagitis: K21.9

## 2021-10-22 HISTORY — DX: Peripheral vascular disease, unspecified: I73.9

## 2021-10-22 LAB — BASIC METABOLIC PANEL
Anion gap: 7 (ref 5–15)
BUN: 14 mg/dL (ref 8–23)
CO2: 25 mmol/L (ref 22–32)
Calcium: 9.4 mg/dL (ref 8.9–10.3)
Chloride: 108 mmol/L (ref 98–111)
Creatinine, Ser: 0.97 mg/dL (ref 0.44–1.00)
GFR, Estimated: >60 mL/min (ref >60)
Glucose, Bld: 91 mg/dL (ref 70–99)
Potassium: 3.8 mmol/L (ref 3.5–5.1)
Sodium: 140 mmol/L (ref 135–145)

## 2021-10-22 LAB — CBC
HCT: 40.3 % (ref 36.0–46.0)
Hemoglobin: 12.7 g/dL (ref 12.0–15.0)
MCH: 26.8 pg (ref 26.0–34.0)
MCHC: 31.5 g/dL (ref 30.0–36.0)
MCV: 85.2 fL (ref 80.0–100.0)
Platelets: 247 10*3/uL (ref 150–400)
RBC: 4.73 MIL/uL (ref 3.87–5.11)
RDW: 14.4 % (ref 11.5–15.5)
WBC: 7.1 10*3/uL (ref 4.0–10.5)
nRBC: 0 % (ref 0.0–0.2)

## 2021-10-22 LAB — SURGICAL PCR SCREEN

## 2021-10-22 NOTE — Telephone Encounter (Addendum)
Patient is having TKR on 10/30/21 but she is afraid of developing a blood clot if she stops taking the Eliquis? What would you advise?

## 2021-10-22 NOTE — Progress Notes (Addendum)
PCP - Rennie Plowman, FNP  Cardiologist - Denies  PPM/ICD - Denies Device Orders - n/a Rep Notified - n/a  Chest x-ray - denies EKG - 10/22/2021 Stress Test - 01/22/2020 ECHO - 01/01/2020  Cardiac Cath - denies  Sleep Study - Denies CPAP - n/a  No DM.   Blood Thinner Instructions: Per Dr. Lajoyce Corners, stop Eliquis 5 days prior to surgery.  Aspirin Instructions: n/a  ERAS Protcol - Clear liquids until 0430 the morning of surgery PRE-SURGERY Ensure or G2- n/a  COVID TEST- n/a   Anesthesia review: Yes. Hx of DVT with PE and currently on Eliquis.   Patient denies shortness of breath, fever, cough and chest pain at PAT appointment   All instructions explained to the patient, with a verbal understanding of the material. Patient agrees to go over the instructions while at home for a better understanding. Patient also instructed to self quarantine after being tested for COVID-19. The opportunity to ask questions was provided.

## 2021-10-22 NOTE — Telephone Encounter (Signed)
Patient is having knee surgery on 10/30/2021. [facility] needs patient off of apixaban (ELIQUIS) 5 MG TABS tablet 5 days prior to surgery. Patient wants to know if it is ok to go off medication.Please call patient.

## 2021-10-22 NOTE — Telephone Encounter (Signed)
FYI  Spoke with Misty Stanley with Pre-Admissions advised her per Dr. Lajoyce Corners patient need to stop taking Eliquis 5 days prior to surgery   Phone number if needed for Misty Stanley   [phone]

## 2021-10-23 NOTE — Telephone Encounter (Signed)
Patient aware and agrees to Lovenox bridge was very concerned about stopping eliquis patient said " I almost died with the last clot" so, patient disappointed about postponing surgery but understanding the need . Scheduled with provider for Tuesday 10/27/21 to discuss lovenox bridge and surgery.    Called [facility] [phone] had too leave message for nurse advising the need to postpone TKA for this patient ask nurse to callback to explain.

## 2021-10-26 ENCOUNTER — Other Ambulatory Visit: Payer: Self-pay

## 2021-10-26 MED ORDER — APIXABAN 5 MG PO TABS
5.0000 mg | ORAL_TABLET | Freq: Two times a day (BID) | ORAL | 3 refills | Status: DC
  Filled 2021-10-26: qty 180, 90d supply, fill #0

## 2021-10-26 MED FILL — Apixaban Tab 5 MG: ORAL | 90 days supply | Qty: 180 | Fill #1 | Status: CN

## 2021-10-26 MED FILL — Apixaban Tab 5 MG: ORAL | 90 days supply | Qty: 180 | Fill #0 | Status: CN

## 2021-10-26 NOTE — Progress Notes (Signed)
Patient will need to have surgical PCR recollected DOS due to invalid sample at pre-admission testing appointment.  Patient will be treated with Profend per protocol.

## 2021-10-27 ENCOUNTER — Other Ambulatory Visit: Payer: Self-pay | Admitting: Family

## 2021-10-27 ENCOUNTER — Ambulatory Visit (INDEPENDENT_AMBULATORY_CARE_PROVIDER_SITE_OTHER): Payer: No Typology Code available for payment source | Admitting: Family

## 2021-10-27 ENCOUNTER — Other Ambulatory Visit: Payer: Self-pay | Admitting: Emergency Medicine

## 2021-10-27 ENCOUNTER — Other Ambulatory Visit: Payer: Self-pay

## 2021-10-27 VITALS — BP 128/80 | HR 70 | Temp 97.9°F | Ht 66.0 in | Wt 225.2 lb

## 2021-10-27 DIAGNOSIS — Z86718 Personal history of other venous thrombosis and embolism: Secondary | ICD-10-CM

## 2021-10-27 MED ORDER — ENOXAPARIN SODIUM 100 MG/ML IJ SOSY
PREFILLED_SYRINGE | INTRAMUSCULAR | 0 refills | Status: DC
  Filled 2021-10-27: qty 10, 5d supply, fill #0

## 2021-10-27 NOTE — Progress Notes (Signed)
Subjective:    Patient ID: Leah Roberts, female    DOB: 06-15-1950, 71 y.o.   MRN: [id number]  CC: Leah Roberts is a 71 y.o. female who presents today for follow up.   HPI: Patient feels well today.  She is here to discuss anticoagulation ahead of left total knee replacement.     Compliant with eliquis 5mg  bid. No bleeding.   Left TKA with Dr had been scheduled 6/30 , postponed.   h/o  Left DVT 2013 after 16 hour flight, treated at that time in 2014 and then came off anticoagulation. She then had PE a year later. H/o thrombophlebitis. H/o thrombophlebitis.    She is not OCP. Non smoker. Sister has clotting disorder. She was never tested for bleeding disorder.   Pre op labs obtained 5 days ago. Platelets 247 HISTORY:  Past Medical History:  Diagnosis Date   DVT (deep venous thrombosis) (HCC)    DVT (deep venous thrombosis) (HCC) 2013   Dyspnea    GERD (gastroesophageal reflux disease)    History of hypertension    Hypertension    Peripheral vascular disease (HCC)    DVT   Pre-diabetes    Pulmonary embolism (HCC) 2013   Past Surgical History:  Procedure Laterality Date   APPENDECTOMY     TONSILLECTOMY AND ADENOIDECTOMY     Family History  Problem Relation Age of Onset   Hypertension Father    Heart disease Father 11   Thyroid disease Sister    Allergies Sister    CVA Neg Hx    Heart attack Neg Hx    High Cholesterol Neg Hx    Colon cancer Neg Hx     Allergies: Sulfa antibiotics and Sulfa antibiotics Current Outpatient Medications on File Prior to Visit  Medication Sig Dispense Refill   apixaban (ELIQUIS) 5 MG TABS tablet Take 1 tablet (5 mg total) by mouth 2 (two) times daily. 90 tablet 3   calcium carbonate (TUMS - DOSED IN MG ELEMENTAL CALCIUM) 500 MG chewable tablet Chew 1 tablet by mouth daily as needed for indigestion or heartburn.     HYDROcodone-acetaminophen (NORCO) 5-325 MG tablet Take 1 tablet by mouth 2 (two) times daily  as needed for moderate pain. 20 tablet 0   loratadine (CLARITIN) 10 MG tablet Take 10 mg by mouth daily.     Menthol, Topical Analgesic, (BIOFREEZE EX) Apply 1 Application topically daily as needed (leg pain).     Multiple Vitamin (MULTIVITAMIN WITH MINERALS) TABS tablet Take 1 tablet by mouth 2 (two) times a week.     No current facility-administered medications on file prior to visit.    Social History   Tobacco Use   Smoking status: Never   Smokeless tobacco: Never  Substance Use Topics   Alcohol use: Yes    Comment: once every three months   Drug use: No    Review of Systems  Constitutional:  Negative for chills and fever.  Respiratory:  Negative for cough.   Cardiovascular:  Negative for chest pain and palpitations.  Gastrointestinal:  Negative for nausea and vomiting.  Musculoskeletal:  Positive for arthralgias (left knee).      Objective:    BP 128/80 (BP Location: Left Arm, Patient Position: Sitting, Cuff Size: Normal)   Pulse 70   Temp 97.9 F (36.6 C) (Oral)   Ht 5\' 6"  (1.676 m)   Wt 225 lb 3.2 oz (102.2 kg)   SpO2 95%   BMI 36.35 kg/m  BP Readings from Last 3 Encounters:  10/27/21 128/80  10/22/21 140/82  09/24/21 131/87   Wt Readings from Last 3 Encounters:  10/27/21 225 lb 3.2 oz (102.2 kg)  10/22/21 225 lb 4.8 oz (102.2 kg)  09/24/21 227 lb 9.6 oz (103.2 kg)    Physical Exam Vitals reviewed.  Constitutional:      Appearance: She is well-developed.  Eyes:     Conjunctiva/sclera: Conjunctivae normal.  Cardiovascular:     Rate and Rhythm: Normal rate and regular rhythm.     Pulses: Normal pulses.     Heart sounds: Normal heart sounds.  Pulmonary:     Effort: Pulmonary effort is normal.     Breath sounds: Normal breath sounds. No wheezing, rhonchi or rales.  Skin:    General: Skin is warm and dry.  Neurological:     Mental Status: She is alert.  Psychiatric:        Speech: Speech normal.        Behavior: Behavior normal.        Thought  Content: Thought content normal.        Assessment & Plan:   Problem List Items Addressed This Visit       Other   History of DVT (deep vein thrombosis) - Primary    She has h/o  Left DVT 2013 after 16 hour flight, treated at that time in [place] and then came off anticoagulation. She then had PE a year later.   She is not OCP. Non smoker. Sister has clotting disorder. She was never tested for bleeding disorder.   Initially prescribed lovenox 100mg  bridge for 5 days prior to surgery. Consulted with Dr , hematology on the phone and via secure chat whom reviewed patient history and advised that she may hold eliquis 5mg  for 2 days prior to surgery if Dr Leah Roberts is comfortable to minimize disruption in anticoagulation. She would need to resume eliquis 5mg  bid as soon as hemostasis achieved and within 24 hours hours. Dr advised continued use of therapeutic dose of eliquis which she is currently on until hematology can evaluate her to screen for bleeding disorder and discuss possible prophylactic dose of eliquis 2.5mg  BID. Referral placed to hematology. CMA Leah Roberts called to cancel lovenox prescription for patient. Letter written to Dr Lajoyce Corners so he is aware of plan.       I have spent 40 minutes with a patient including precharting, exam, collaborating with hematology,  reviewing medical records, and discussion plan of care.      I have discontinued Leah Roberts's albuterol, Eliquis, and cyclobenzaprine. I am also having her maintain her loratadine, HYDROcodone-acetaminophen, multivitamin with minerals, (Menthol, Topical Analgesic, (BIOFREEZE EX)), calcium carbonate, and apixaban.   Meds ordered this encounter  Medications   DISCONTD: enoxaparin (LOVENOX) 100 MG/ML injection    Sig: Inject 1 mL (100 mg total) into the skin every 12 (twelve) hours for 5 days. Start 5 days before total knee replacement.    Dispense:  10 mL    Refill:  0    Order Specific Question:   Supervising  Provider    Answer:   [[id number]]    Return precautions given.   Risks, benefits, and alternatives of the medications and treatment plan prescribed today were discussed, and patient expressed understanding.   Education regarding symptom management and diagnosis given to patient on AVS.  Continue to follow with Leah Hoops, FNP for routine health maintenance.   Leah Roberts and  I agreed with plan.   [name], FNP

## 2021-10-28 ENCOUNTER — Telehealth: Payer: Self-pay | Admitting: Family

## 2021-10-28 ENCOUNTER — Other Ambulatory Visit: Payer: Self-pay

## 2021-10-28 ENCOUNTER — Other Ambulatory Visit: Payer: Self-pay | Admitting: Family

## 2021-10-28 DIAGNOSIS — Z86718 Personal history of other venous thrombosis and embolism: Secondary | ICD-10-CM

## 2021-10-28 MED FILL — Lidocaine Patch 5%: CUTANEOUS | 5 days supply | Qty: 10 | Fill #0 | Status: CN

## 2021-10-28 NOTE — Telephone Encounter (Signed)
Called pt after speaking with Dr Cathie Hoops   Initially prescribed lovenox 100mg  bridge for 5 days prior to surgery. Consulted with Dr , hematology on the phone and via secure chat whom reviewed patient history and advised that she may hold eliquis 5mg  for 2 days prior to surgery if Dr Cathie Hoops is comfortable to minimize disruption in anticoagulation. She would need to resume eliquis 5mg  bid as soon as hemostasis achieved and within 24 hours hours. Dr advised continued use of therapeutic dose of eliquis which she is currently on until hematology can evaluate her to screen for bleeding disorder and discuss possible prophylactic dose of eliquis 2.5mg  BID. Referral placed to hematology. CMA Sarah Pates called to cancel lovenox prescription for patient. Letter written to Dr Lajoyce Corners so he is aware of plan.   Patient verbalized understanding of plan.

## 2021-10-28 NOTE — Assessment & Plan Note (Signed)
She has h/o  Left DVT 2013 after 16 hour flight, treated at that time in [place] and then came off anticoagulation. She then had PE a year later.   She is not OCP. Non smoker. Sister has clotting disorder. She was never tested for bleeding disorder.   Initially prescribed lovenox 100mg  bridge for 5 days prior to surgery. Consulted with Dr , hematology on the phone and via secure chat whom reviewed patient history and advised that she may hold eliquis 5mg  for 2 days prior to surgery if Dr Cathie Hoops is comfortable to minimize disruption in anticoagulation. She would need to resume eliquis 5mg  bid as soon as hemostasis achieved and within 24 hours hours. Dr advised continued use of therapeutic dose of eliquis which she is currently on until hematology can evaluate her to screen for bleeding disorder and discuss possible prophylactic dose of eliquis 2.5mg  BID. Referral placed to hematology. CMA Sarah Pates called to cancel lovenox prescription for patient. Letter written to Dr Lajoyce Corners so he is aware of plan.

## 2021-10-29 ENCOUNTER — Other Ambulatory Visit: Payer: Self-pay

## 2021-10-30 ENCOUNTER — Ambulatory Visit (HOSPITAL_COMMUNITY)
Admission: RE | Admit: 2021-10-30 | Payer: No Typology Code available for payment source | Source: Home / Self Care | Admitting: Orthopedic Surgery

## 2021-10-30 ENCOUNTER — Encounter (HOSPITAL_COMMUNITY): Admission: RE | Payer: Self-pay | Source: Home / Self Care

## 2021-10-30 DIAGNOSIS — Z01818 Encounter for other preprocedural examination: Secondary | ICD-10-CM

## 2021-10-30 SURGERY — ARTHROPLASTY, KNEE, TOTAL
Anesthesia: Choice | Site: Knee | Laterality: Left

## 2021-11-05 ENCOUNTER — Other Ambulatory Visit: Payer: Self-pay

## 2021-11-10 ENCOUNTER — Inpatient Hospital Stay: Payer: No Typology Code available for payment source

## 2021-11-10 ENCOUNTER — Inpatient Hospital Stay: Payer: No Typology Code available for payment source | Attending: Oncology | Admitting: Oncology

## 2021-11-10 ENCOUNTER — Encounter: Payer: Self-pay | Admitting: Oncology

## 2021-11-10 ENCOUNTER — Other Ambulatory Visit: Payer: Self-pay

## 2021-11-10 VITALS — BP 116/77 | HR 85 | Temp 97.7°F | Resp 18 | Wt 221.4 lb

## 2021-11-10 DIAGNOSIS — Z86711 Personal history of pulmonary embolism: Secondary | ICD-10-CM | POA: Insufficient documentation

## 2021-11-10 DIAGNOSIS — Z832 Family history of diseases of the blood and blood-forming organs and certain disorders involving the immune mechanism: Secondary | ICD-10-CM | POA: Insufficient documentation

## 2021-11-10 DIAGNOSIS — Z86718 Personal history of other venous thrombosis and embolism: Secondary | ICD-10-CM | POA: Insufficient documentation

## 2021-11-10 DIAGNOSIS — Z7901 Long term (current) use of anticoagulants: Secondary | ICD-10-CM | POA: Insufficient documentation

## 2021-11-10 LAB — ANTITHROMBIN III: AntiThromb III Func: 98 % (ref 75–120)

## 2021-11-10 MED FILL — Lidocaine Patch 5%: CUTANEOUS | 5 days supply | Qty: 10 | Fill #0 | Status: CN

## 2021-11-10 NOTE — Progress Notes (Unsigned)
[facility]  Telephone:([phone]) [phone] Fax:(336) [phone]  ID: Leah Roberts OB: 02-Jul-1950  MR#: [id number]  NAT#:[id number]  Patient Care Team: Allegra Grana, FNP as PCP - General (Family Medicine)  CHIEF COMPLAINT: History of DVT and PE.  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  She deferred to clinic today for recommendations of anticoagulation management during knee replacement surgery as well as a full hypercoagulable work-up which per patient has never been done.  She currently feels well.  She has no neurologic complaints.  She denies any recent fevers or illnesses.  She has a good appetite and denies weight loss.  She has no chest pain, shortness of breath, vomiting, or hemoptysis.  She denies any nausea, vomiting, constipation, or diarrhea.  She has no urinary complaints.  Patient feels at her baseline offers no specific complaints today.  REVIEW OF SYSTEMS:   Review of Systems  Constitutional: Negative.  Negative for fever, malaise/fatigue and weight loss.  Respiratory: Negative.  Negative for cough, hemoptysis and shortness of breath.   Cardiovascular: Negative.  Negative for chest pain and leg swelling.  Gastrointestinal: Negative.  Negative for abdominal pain.  Genitourinary: Negative.  Negative for dysuria.  Musculoskeletal:  Positive for joint pain.  Skin: Negative.  Negative for rash.  Neurological: Negative.  Negative for dizziness, focal weakness, weakness and headaches.  Endo/Heme/Allergies:  Does not bruise/bleed easily.  Psychiatric/Behavioral: Negative.  The patient is not nervous/anxious.     As per HPI. Otherwise, a complete review of systems is negative.  PAST MEDICAL HISTORY: Past Medical History:  Diagnosis Date   DVT (deep venous thrombosis) (HCC)    DVT (deep venous  thrombosis) (HCC) 2013   Dyspnea    GERD (gastroesophageal reflux disease)    History of hypertension    Hypertension    Peripheral vascular disease (HCC)    DVT   Pre-diabetes    Pulmonary embolism (HCC) 2013    PAST SURGICAL HISTORY: Past Surgical History:  Procedure Laterality Date   APPENDECTOMY     TONSILLECTOMY AND ADENOIDECTOMY      FAMILY HISTORY: Family History  Problem Relation Age of Onset   Hypertension Father    Heart disease Father 96   Thyroid disease Sister    Allergies Sister    CVA Neg Hx    Heart attack Neg Hx    High Cholesterol Neg Hx    Colon cancer Neg Hx     ADVANCED DIRECTIVES (Y/N):  N  HEALTH MAINTENANCE: Social History   Tobacco Use   Smoking status: Never   Smokeless tobacco: Never  Substance Use Topics   Alcohol use: Yes    Comment: once every three months   Drug use: No     Colonoscopy:  PAP:  Bone density:  Lipid panel:  Allergies  Allergen Reactions   Sulfa Antibiotics Swelling and Rash   Sulfa Antibiotics Rash    Current Outpatient Medications  Medication Sig Dispense Refill   apixaban (ELIQUIS) 5 MG TABS tablet Take 1 tablet (5 mg total) by mouth 2 (two) times daily. 90 tablet 3   calcium carbonate (TUMS - DOSED IN MG ELEMENTAL CALCIUM) 500 MG chewable tablet Chew 1 tablet by mouth daily as needed for indigestion or heartburn.     HYDROcodone-acetaminophen (NORCO) 5-325 MG tablet Take 1 tablet by mouth 2 (two) times daily  as needed for moderate pain. 20 tablet 0   lidocaine (LIDODERM) 5 % Place 1 patch onto the skin every 12 (twelve) hours. Remove & Discard patch within 12 hours or as directed by MD 10 patch 0   loratadine (CLARITIN) 10 MG tablet Take 10 mg by mouth daily.     Menthol, Topical Analgesic, (BIOFREEZE EX) Apply 1 Application topically daily as needed (leg pain).     Multiple Vitamin (MULTIVITAMIN WITH MINERALS) TABS tablet Take 1 tablet by mouth 2 (two) times a week.     No current facility-administered  medications for this visit.    OBJECTIVE: Vitals:   11/10/21 1500  BP: 116/77  Pulse: 85  Resp: 18  Temp: 97.7 F (36.5 C)  SpO2: 99%     Body mass index is 35.73 kg/m.    ECOG FS:0 - Asymptomatic  General: Well-developed, well-nourished, no acute distress. Eyes: Pink conjunctiva, anicteric sclera. HEENT: Normocephalic, moist mucous membranes. Lungs: No audible wheezing or coughing. Heart: Regular rate and rhythm. Abdomen: Soft, nontender, no obvious distention. Musculoskeletal: No edema, cyanosis, or clubbing. Neuro: Alert, answering all questions appropriately. Cranial nerves grossly intact. Skin: No rashes or petechiae noted. Psych: Normal affect. Lymphatics: No cervical, calvicular, axillary or inguinal LAD.   LAB RESULTS:  Lab Results  Component Value Date   NA 140 10/22/2021   K 3.8 10/22/2021   CL 108 10/22/2021   CO2 25 10/22/2021   GLUCOSE 91 10/22/2021   BUN 14 10/22/2021   CREATININE 0.97 10/22/2021   CALCIUM 9.4 10/22/2021   PROT 7.1 06/18/2016   ALBUMIN 3.9 06/18/2016   AST 19 06/18/2016   ALT 13 06/18/2016   ALKPHOS 51 06/18/2016   BILITOT 0.4 06/18/2016   GFRNONAA >60 10/22/2021   GFRAA >60 01/01/2020    Lab Results  Component Value Date   WBC 7.1 10/22/2021   NEUTROABS 3.0 05/29/2020   HGB 12.7 10/22/2021   HCT 40.3 10/22/2021   MCV 85.2 10/22/2021   PLT 247 10/22/2021     STUDIES: No results found.  ASSESSMENT:  History of DVT and PE.  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  She also reports both her brother and sister have history of DVT.  Agree with previous recommendation of holding Eliquis for 2 days prior to her knee replacement surgery with a standard Lovenox bridge and then 3 starting Eliquis the day following surgery.  Will do a full hypercoagulable work-up today to assess if  there is any underlying etiology given her personal and family history of blood clot.  Either way, patient wishes to remain on lifelong Eliquis, but if there is no underlying genetic disorder, she may be able to decrease her dose to 2.5 mg twice daily.  Patient will have video-assisted telemedicine visit in 3 weeks to discuss the results. Knee replacement surgery: Lovenox bridge as above.  Results of hypercoagulable work-up should not affect scheduling of surgery.  I spent a total of 45 minutes reviewing chart data, face-to-face evaluation with the patient, counseling and coordination of care as detailed above.   Patient expressed understanding and was in agreement with this plan. She also understands that She can call clinic at any time with any questions, concerns, or complaints.     Jeralyn Ruths, MD   11/11/2021 8:36 AM

## 2021-11-11 LAB — CARDIOLIPIN ANTIBODIES, IGG, IGM, IGA
Anticardiolipin IgA: <9 APL U/mL (ref 0–11)
Anticardiolipin IgG: <9 GPL U/mL (ref 0–14)
Anticardiolipin IgM: <9 MPL U/mL (ref 0–12)

## 2021-11-12 LAB — LUPUS ANTICOAGULANT PANEL
DRVVT: 72.1 s — ABNORMAL HIGH (ref 0.0–47.0)
PTT Lupus Anticoagulant: 34 s (ref 0.0–43.5)

## 2021-11-12 LAB — PROTEIN S ACTIVITY: Protein S Activity: 77 % (ref 63–140)

## 2021-11-12 LAB — PROTEIN C ACTIVITY: Protein C Activity: 131 % (ref 73–180)

## 2021-11-12 LAB — PROTEIN S, TOTAL: Protein S Ag, Total: 137 % (ref 60–150)

## 2021-11-12 LAB — DRVVT MIX: dRVVT Mix: 53.2 s — ABNORMAL HIGH (ref 0.0–40.4)

## 2021-11-12 LAB — DRVVT CONFIRM: dRVVT Confirm: 1.2 ratio (ref 0.8–1.2)

## 2021-11-13 ENCOUNTER — Telehealth: Payer: Self-pay | Admitting: Orthopedic Surgery

## 2021-11-13 NOTE — Telephone Encounter (Signed)
Patient called needing Rx refilled Hydrocodone. The number to  contact patient is [phone]

## 2021-11-13 NOTE — Telephone Encounter (Signed)
Pt is pending right knee replacement, was recently canceled due to blood thinner and needing lovenox bridging. Hydrocodone last filled 10/15/21 #20. Thank you.

## 2021-11-14 ENCOUNTER — Other Ambulatory Visit: Payer: Self-pay | Admitting: Physician Assistant

## 2021-11-14 DIAGNOSIS — R936 Abnormal findings on diagnostic imaging of limbs: Secondary | ICD-10-CM

## 2021-11-14 DIAGNOSIS — M1710 Unilateral primary osteoarthritis, unspecified knee: Secondary | ICD-10-CM

## 2021-11-14 DIAGNOSIS — M259 Joint disorder, unspecified: Secondary | ICD-10-CM

## 2021-11-14 DIAGNOSIS — M5416 Radiculopathy, lumbar region: Secondary | ICD-10-CM

## 2021-11-14 DIAGNOSIS — G8929 Other chronic pain: Secondary | ICD-10-CM

## 2021-11-14 MED ORDER — HYDROCODONE-ACETAMINOPHEN 5-325 MG PO TABS
1.0000 | ORAL_TABLET | Freq: Two times a day (BID) | ORAL | 0 refills | Status: DC | PRN
  Filled 2021-11-14: qty 20, 10d supply, fill #0

## 2021-11-14 NOTE — Telephone Encounter (Signed)
refilled 

## 2021-11-15 ENCOUNTER — Other Ambulatory Visit: Payer: Self-pay

## 2021-11-16 ENCOUNTER — Other Ambulatory Visit: Payer: Self-pay

## 2021-11-16 ENCOUNTER — Telehealth: Payer: Self-pay | Admitting: Family

## 2021-11-16 DIAGNOSIS — Z86718 Personal history of other venous thrombosis and embolism: Secondary | ICD-10-CM

## 2021-11-16 LAB — PROTHROMBIN GENE MUTATION

## 2021-11-16 NOTE — Telephone Encounter (Signed)
Pt called stating that we canceled her knee surgery because the provider did not have the plan in place. Pt would like to be called regarding the provider talking to the hematologist about a plan for the surgery to be rescheduled

## 2021-11-17 ENCOUNTER — Other Ambulatory Visit: Payer: Self-pay

## 2021-11-17 NOTE — Telephone Encounter (Signed)
Dr Orlie Dakin  Thanks for seeing pt and reviewed your note  Pt calling about upcoming knee replacement to which she wants to schedule  I want to ensure that I am clear and will call patient.  Feel free to call me if easier. Shuntia Exton [phone] ( cell)    Hold Eliquis  5mg  BID 2 days prior to surgery Start lovenox 2 days prior to surgery Resume Eliquis 5mg  BID the day following surgery   Regarding dose-   Do you agree the below is correct based on her weight? ( BMI 35) .  I wasn't sure if I use 1mg /kg treatment dose or prophylaxis dose of 40mg  [place] QD.   I had originally prescribed the below-  Lovenox 100mg /mL [place]   Inject 60mL (100mg ) [place] BID x 2 days.

## 2021-11-20 LAB — PROTEIN C, TOTAL: Protein C, Total: 114 % (ref 60–150)

## 2021-11-21 ENCOUNTER — Encounter: Payer: Self-pay | Admitting: Family

## 2021-11-21 ENCOUNTER — Encounter: Payer: Self-pay | Admitting: Orthopedic Surgery

## 2021-11-25 LAB — FACTOR 5 LEIDEN

## 2021-11-28 NOTE — Progress Notes (Unsigned)
Newborn  [phone]) [phone] Fax:(336) [phone]  ID: Leah Roberts OB: 09-12-1950  MR#: [id number]  [id number]  Patient Care Team: Burnard Hawthorne, FNP as PCP - General (Family Medicine)  I connected with Leah Roberts on 12/03/21 at 11:00 AM EDT by video enabled telemedicine visit and verified that I am speaking with the correct person using two identifiers.   I discussed the limitations, risks, security and privacy concerns of performing an evaluation and management service by telemedicine and the availability of in-person appointments. I also discussed with the patient that there may be a patient responsible charge related to this service. The patient expressed understanding and agreed to proceed.   Other persons participating in the visit and their role in the encounter: Patient, MD.  Patient's location: Home. Provider's location: Clinic.  CHIEF COMPLAINT: History of DVT and PE.  INTERVAL HISTORY: Patient agreed to video assisted telemedicine visit for further evaluation and discussion of her hypercoagulable work-up.  She currently feels well and is asymptomatic.   She has no neurologic complaints.  She denies any recent fevers or illnesses.  She has a good appetite and denies weight loss.  She has no chest pain, shortness of breath, vomiting, or hemoptysis.  She denies any nausea, vomiting, constipation, or diarrhea.  She has no urinary complaints.  Patient offers no further specific complaints today.  REVIEW OF SYSTEMS:   Review of Systems  Constitutional: Negative.  Negative for fever, malaise/fatigue and weight loss.  Respiratory: Negative.  Negative for cough, hemoptysis and shortness of breath.   Cardiovascular: Negative.  Negative for chest pain and leg swelling.  Gastrointestinal: Negative.  Negative for abdominal pain.  Genitourinary: Negative.  Negative for dysuria.  Musculoskeletal:  Positive for joint pain.  Skin: Negative.   Negative for rash.  Neurological: Negative.  Negative for dizziness, focal weakness, weakness and headaches.  Endo/Heme/Allergies:  Does not bruise/bleed easily.  Psychiatric/Behavioral: Negative.  The patient is not nervous/anxious.     As per HPI. Otherwise, a complete review of systems is negative.  PAST MEDICAL HISTORY: Past Medical History:  Diagnosis Date   DVT (deep venous thrombosis) (HCC)    DVT (deep venous thrombosis) ([place]) 2013   Dyspnea    GERD (gastroesophageal reflux disease)    History of hypertension    Hypertension    Peripheral vascular disease (HCC)    DVT   Pre-diabetes    Pulmonary embolism ([place]) 2013    PAST SURGICAL HISTORY: Past Surgical History:  Procedure Laterality Date   APPENDECTOMY     TONSILLECTOMY AND ADENOIDECTOMY      FAMILY HISTORY: Family History  Problem Relation Age of Onset   Hypertension Father    Heart disease Father 38   Thyroid disease Sister    Allergies Sister    CVA Neg Hx    Heart attack Neg Hx    High Cholesterol Neg Hx    Colon cancer Neg Hx     ADVANCED DIRECTIVES (Y/N):  N  HEALTH MAINTENANCE: Social History   Tobacco Use   Smoking status: Never   Smokeless tobacco: Never  Substance Use Topics   Alcohol use: Yes    Comment: once every three months   Drug use: No     Colonoscopy:  PAP:  Bone density:  Lipid panel:  Allergies  Allergen Reactions   Sulfa Antibiotics Swelling and Rash   Sulfa Antibiotics Rash    Current Outpatient Medications  Medication Sig Dispense Refill   apixaban (ELIQUIS) 5  MG TABS tablet Take 1 tablet (5 mg total) by mouth 2 (two) times daily. 90 tablet 3   calcium carbonate (TUMS - DOSED IN MG ELEMENTAL CALCIUM) 500 MG chewable tablet Chew 1 tablet by mouth daily as needed for indigestion or heartburn.     enoxaparin (LOVENOX) 100 MG/ML injection Inject 1 mL (100 mg total) into the skin every 12 (twelve) hours for 2 days. Start 2 days before total knee replacement 4 mL 0    HYDROcodone-acetaminophen (NORCO) 5-325 MG tablet Take 1 tablet by mouth 2 (two) times daily as needed for moderate pain. 20 tablet 0   lidocaine (LIDODERM) 5 % Place 1 patch onto the skin every 12 (twelve) hours. Remove & Discard patch within 12 hours or as directed by MD 10 patch 0   loratadine (CLARITIN) 10 MG tablet Take 10 mg by mouth daily.     Menthol, Topical Analgesic, (BIOFREEZE EX) Apply 1 Application topically daily as needed (leg pain).     Multiple Vitamin (MULTIVITAMIN WITH MINERALS) TABS tablet Take 1 tablet by mouth 2 (two) times a week.     No current facility-administered medications for this visit.    OBJECTIVE: There were no vitals filed for this visit.    There is no height or weight on file to calculate BMI.    ECOG FS:0 - Asymptomatic  General: Well-developed, well-nourished, no acute distress. HEENT: Normocephalic. Neuro: Alert, answering all questions appropriately. Cranial nerves grossly intact. Psych: Normal affect.  LAB RESULTS:  Lab Results  Component Value Date   NA 140 10/22/2021   K 3.8 10/22/2021   CL 108 10/22/2021   CO2 25 10/22/2021   GLUCOSE 91 10/22/2021   BUN 14 10/22/2021   CREATININE 0.97 10/22/2021   CALCIUM 9.4 10/22/2021   PROT 7.1 06/18/2016   ALBUMIN 3.9 06/18/2016   AST 19 06/18/2016   ALT 13 06/18/2016   ALKPHOS 51 06/18/2016   BILITOT 0.4 06/18/2016   GFRNONAA >60 10/22/2021   GFRAA >60 01/01/2020    Lab Results  Component Value Date   WBC 7.1 10/22/2021   NEUTROABS 3.0 05/29/2020   HGB 12.7 10/22/2021   HCT 40.3 10/22/2021   MCV 85.2 10/22/2021   PLT 247 10/22/2021     STUDIES: No results found.  ASSESSMENT:  History of DVT and PE.  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  She also reports both her brother and 4 sisters have history of DVT.  Agree with previous  recommendation of holding Eliquis for 2 days prior to her knee replacement surgery with a standard Lovenox bridge and then 3 starting Eliquis the day following surgery.  Full hypercoagulable work-up is negative.  DRVVT is likely positive secondary to Eliquis.  Agree with lifelong Eliquis.  Patient has expressed interest in decreasing her dose to 2.5 mg twice per day.  She has been instructed to transition to this dosing schedule in September approximately 3 weeks after her knee replacement surgery.  No further intervention is needed.  No further follow-up has been scheduled.   Knee replacement surgery: Lovenox bridge as above.     I provided 20 minutes of face-to-face video visit time during this encounter which included chart review, counseling, and coordination of care as documented above.   Patient expressed understanding and was in agreement with this plan. She also understands  that She can call clinic at any time with any questions, concerns, or complaints.     Jeralyn Ruths, MD   12/03/2021 8:52 AM

## 2021-12-01 ENCOUNTER — Other Ambulatory Visit: Payer: Self-pay

## 2021-12-01 MED ORDER — ENOXAPARIN SODIUM 100 MG/ML IJ SOSY
100.0000 mg | PREFILLED_SYRINGE | Freq: Two times a day (BID) | INTRAMUSCULAR | 0 refills | Status: DC
  Filled 2021-12-01: qty 4, 2d supply, fill #0

## 2021-12-01 NOTE — Telephone Encounter (Signed)
   Call pt  I see that she is scheduled to to have knee replacement 12/09/21  Please advise of these directions and sch a VV visit if any questions at all.     Hold Eliquis  5mg  BID 2 days prior to surgery Start lovenox 2 days prior to surgery . I have sent in prescription to [facility].  Resume Eliquis 5mg  BID the day following surgery  Please keep appointment with Dr [facility] tomorrow

## 2021-12-01 NOTE — Addendum Note (Signed)
Addended by: Allegra Grana on: 12/01/2021 12:47 PM   Modules accepted: Orders

## 2021-12-01 NOTE — Telephone Encounter (Signed)
Spoke to patient and went ov. Beer directions to stop eliquis two days before surgery and to begin the lovenox 2 days before surgery Also to keep appointment with Dr Sherlynn Stalls tomorrow

## 2021-12-01 NOTE — Telephone Encounter (Signed)
Thanks Dr Orlie Dakin. She is seeing you tomorrow FYI. I went ahead and sent in lovenox bridge and we are calling patient with instructions.   Knee replacement scheduled for 12/09/21

## 2021-12-02 ENCOUNTER — Other Ambulatory Visit: Payer: Self-pay

## 2021-12-02 ENCOUNTER — Inpatient Hospital Stay: Payer: No Typology Code available for payment source | Attending: Oncology | Admitting: Oncology

## 2021-12-02 DIAGNOSIS — Z86718 Personal history of other venous thrombosis and embolism: Secondary | ICD-10-CM

## 2021-12-08 ENCOUNTER — Encounter (HOSPITAL_COMMUNITY): Payer: Self-pay | Admitting: Orthopedic Surgery

## 2021-12-08 MED ORDER — CHLORHEXIDINE GLUCONATE 0.12 % MT SOLN: 15.0000 mL | Freq: Once | OROMUCOSAL | Status: DC

## 2021-12-08 MED ORDER — ORAL CARE MOUTH RINSE: 15.0000 mL | Freq: Once | OROMUCOSAL | Status: DC

## 2021-12-08 MED ORDER — LACTATED RINGERS IV SOLN: INTRAVENOUS | Status: DC

## 2021-12-08 NOTE — Anesthesia Preprocedure Evaluation (Addendum)
Anesthesia Evaluation  Patient identified by MRN, date of birth, ID band Patient awake    Reviewed: Allergy & Precautions, NPO status , Patient's Chart, lab work & pertinent test results  History of Anesthesia Complications (+) Family history of anesthesia reaction  Airway Mallampati: II  TM Distance: >3 FB Neck ROM: Full    Dental no notable dental hx. (+) Dental Advisory Given, Loose,    Pulmonary shortness of breath and with exertion, PE Hx/o PTE 2013 Hx/o pulmonary nodule Hx/o + PPD   Pulmonary exam normal breath sounds clear to auscultation       Cardiovascular hypertension, + angina + Peripheral Vascular Disease, + DOE and + DVT  Normal cardiovascular exam Rhythm:Regular Rate:Normal  Hx/o DVT 2013 after a long flight   Neuro/Psych  Neuromuscular disease negative psych ROS   GI/Hepatic Neg liver ROS, GERD  Medicated,  Endo/Other  Pre Diabetes Obesity  Renal/GU negative Renal ROS  negative genitourinary   Musculoskeletal  (+) Arthritis , Osteoarthritis,  OA left knee   Abdominal (+) + obese,   Peds  Hematology  (+) Blood dyscrasia, anemia , Eliquis therapy- last dose 8/6 pm Lovenox therapy- last dose 8/8 pm   Anesthesia Other Findings   Reproductive/Obstetrics                          Anesthesia Physical Anesthesia Plan  ASA: 3  Anesthesia Plan: General   Post-op Pain Management: Regional block*, Precedex and Dilaudid IV   Induction: Intravenous  PONV Risk Score and Plan: 3 and Treatment may vary due to age or medical condition, Ondansetron and Dexamethasone  Airway Management Planned: Oral ETT  Additional Equipment: None  Intra-op Plan:   Post-operative Plan: Extubation in OR  Informed Consent: I have reviewed the patients History and Physical, chart, labs and discussed the procedure including the risks, benefits and alternatives for the proposed anesthesia with the  patient or authorized representative who has indicated his/her understanding and acceptance.     Dental advisory given  Plan Discussed with: CRNA and Anesthesiologist  Anesthesia Plan Comments: (PAT note written 12/08/2021 by Shonna Chock, PA-C. )      Anesthesia Quick Evaluation

## 2021-12-08 NOTE — Progress Notes (Signed)
Ms Leah Roberts denies chest pain or shortness of breath. Patient denies having any s/s of Covid in her household and denies any known exposure to Covid.   Leah Roberts has history of DVT and PEs , she is seen by Dr.Finnegan and orders and monitor blood thinners. Last dose of Eliquis was on 12/06/21 evening dose. Leah Roberts started Lovenox on Monday, 12/07/21, she will take last dose this evening.  Leah Roberts PCP is Leah Plowman, FNP

## 2021-12-08 NOTE — H&P (Signed)
TOTAL KNEE ADMISSION H&P  Patient is being admitted for left total knee arthroplasty.  Subjective:  Chief Complaint:left knee pain.  HPI: Leah Roberts, 71 y.o. female, has a history of pain and functional disability in the left knee due to arthritis and has failed non-surgical conservative treatments for greater than 12 weeks to includeNSAID's and/or analgesics, corticosteriod injections, flexibility and strengthening excercises, use of assistive devices, weight reduction as appropriate, and activity modification.  Onset of symptoms was gradual, starting 8 years ago with gradually worsening course since that time. The patient noted no past surgery on the left knee(s).  Patient currently rates pain in the left knee(s) at 8 out of 10 with activity. Patient has night pain, worsening of pain with activity and weight bearing, pain that interferes with activities of daily living, pain with passive range of motion, crepitus, and joint swelling.  Patient has evidence of subchondral cysts, subchondral sclerosis, periarticular osteophytes, joint subluxation, and joint space narrowing by imaging studies. This patient has had avascular necrosis of the knee. There is no active infection.  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   Arthralgia 07/12/2018   Solitary pulmonary nodule 07/12/2018   Neck pain 02/15/2018   Dizziness 02/15/2018   Elevated TSH 02/15/2018   Screening for HIV (human immunodeficiency  virus) 10/05/2017   Neuropathy 10/05/2017   Screening for colon cancer 10/05/2017   Hyperlipidemia 08/04/2016   Prediabetes 08/04/2016   Back pain 08/04/2016   Bilateral knee pain 06/18/2016   History of DVT of lower extremity 04/01/2014   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   GERD (gastroesophageal reflux disease)    History of blood transfusion    History of hypertension    Hypertension    Peripheral vascular disease (HCC)    DVT   Pre-diabetes    Pulmonary embolism (HCC) 2013    Past Surgical History:  Procedure Laterality Date   APPENDECTOMY     TONSILLECTOMY AND ADENOIDECTOMY      Current Facility-Administered Medications  Medication Dose Route Frequency Provider Last Rate Last Admin   chlorhexidine (PERIDEX) 0.12 % solution 15 mL  15 mL Mouth/Throat Once Jairo Ben, MD       Or   Oral care mouth rinse  15 mL Mouth Rinse Once Jairo Ben, MD       lactated ringers infusion   Intravenous Continuous Jairo Ben, MD       Current Outpatient Medications  Medication Sig Dispense Refill Last Dose   acetaminophen (TYLENOL) 500 MG tablet Take 1,000 mg by mouth every 6 (six) hours as needed for moderate pain.      apixaban (ELIQUIS) 5 MG TABS tablet Take 1 tablet (5 mg total) by mouth 2 (  two) times daily. 90 tablet 3    calcium carbonate (TUMS - DOSED IN MG ELEMENTAL CALCIUM) 500 MG chewable tablet Chew 1 tablet by mouth daily as needed for indigestion or heartburn.      HYDROcodone-acetaminophen (NORCO) 5-325 MG tablet Take 1 tablet by mouth 2 (two) times daily as needed for moderate pain. 20 tablet 0    loratadine (CLARITIN) 10 MG tablet Take 10 mg by mouth daily.      Menthol, Topical Analgesic, (BIOFREEZE EX) Apply 1 Application  topically daily as needed (leg pain).      Multiple Vitamin (MULTIVITAMIN WITH MINERALS) TABS tablet Take 1 tablet by mouth 2 (two) times a week.      enoxaparin (LOVENOX) 100 MG/ML injection Inject 1 mL (100 mg total) into the skin every 12 (twelve) hours for 2 days. Start 2 days before total knee replacement 4 mL 0    lidocaine (LIDODERM) 5 % Place 1 patch onto the skin every 12 (twelve) hours. Remove & Discard patch within 12 hours or as directed by MD (Patient not taking: Reported on 12/03/2021) 10 patch 0 Not Taking   Allergies  Allergen Reactions   Sulfa Antibiotics Swelling and Rash    Social History   Tobacco Use   Smoking status: Never   Smokeless tobacco: Never  Substance Use Topics   Alcohol use: Yes    Comment: Ocassional glass of wine    Family History  Problem Relation Age of Onset   Hypertension Father    Heart disease Father 21   Thyroid disease Sister    Allergies Sister    CVA Neg Hx    Heart attack Neg Hx    High Cholesterol Neg Hx    Colon cancer Neg Hx      Review of Systems  All other systems reviewed and are negative.   Objective:  Physical Exam  Vital signs in last 24 hours: Weight:  [90.7 kg] 90.7 kg (08/08 1500)  Labs:   Estimated body mass index is 32.28 kg/m as calculated from the following:   Height as of this encounter: 5\' 6"  (1.676 m).   Weight as of this encounter: 90.7 kg.   Imaging Review Plain radiographs demonstrate moderate degenerative joint disease of the left knee(s). The overall alignment ismild varus. The bone quality appears to be adequate for age and reported activity level.      Assessment/Plan:  End stage arthritis, left knee   The patient history, physical examination, clinical judgment of the provider and imaging studies are consistent with end stage degenerative joint disease of the left knee(s) and total knee arthroplasty is deemed medically necessary. The treatment options including medical management,  injection therapy arthroscopy and arthroplasty were discussed at length. The risks and benefits of total knee arthroplasty were presented and reviewed. The risks due to aseptic loosening, infection, stiffness, patella tracking problems, thromboembolic complications and other imponderables were discussed. The patient acknowledged the explanation, agreed to proceed with the plan and consent was signed. Patient is being admitted for inpatient treatment for surgery, pain control, PT, OT, prophylactic antibiotics, VTE prophylaxis, progressive ambulation and ADL's and discharge planning. The patient is planning to be discharged home with home health services     Patient's anticipated LOS is less than 2 midnights, meeting these requirements: - Younger than 52 - Lives within 1 hour of care - Has a competent adult at home to recover with post-op recover - NO history of  - Chronic pain requiring opiods  -  Diabetes  - Coronary Artery Disease  - Heart failure  - Heart attack  - Stroke  - DVT/VTE  - Cardiac arrhythmia  - Respiratory Failure/COPD  - Renal failure  - Anemia  - Advanced Liver disease

## 2021-12-08 NOTE — Progress Notes (Signed)
Anesthesia Chart Review: SAME DAY WORK-UP  Case: [id number] Date/Time: 12/09/21 0815   Procedure: LEFT TOTAL KNEE ARTHROPLASTY (Left: Knee)   Anesthesia type: Choice   Pre-op diagnosis: Osteoarthritis Left Knee   Location: [facility] ROOM 04 / [facility]   Surgeons: Nadara Mustard, MD       DISCUSSION: Patient is a 71 year old female scheduled for the above procedure. Surgery was initially scheduled for 10/30/21, but it was postponed until she could see hematology given history of DVT/PE (2013).  History includes never smoker, HTN, prediabetes, GERD, dyspnea, PE (2013), DVT (2013). By notes, she is originally from [place].   She had recent hematology evaluation with Dr. Orlie Dakin. 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 Siblings also with history of DVT. Her hypercoagulable work-up on  11/10/21 was negative. 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 She was interested in this but advised to wait approximately 3 weeks after TKR. He recommended holding Eliquis for 2 days prior to TKR with standard Lovenox bridging with resumption of Eliquis on POD#1.  Patient is a SAME DAY WORK-UP. Chart reviewed on 12/08/21. Eliquis and Lovenox instructions as above per hematology. Anesthesia team to evaluated on the day of surgery.    VS:  BP Readings from Last 3 Encounters:  11/10/21 116/77  10/27/21 128/80  10/22/21 140/82   Pulse Readings from Last 3 Encounters:  11/10/21 85  10/27/21 70  10/22/21 78     PROVIDERS: Allegra Grana, FNP is PCP   Gerarda Fraction, MD is HEM. Her hypercoagulable work-up on  11/10/21 was negative. 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  As needed follow-up recommended as of 12/02/21.   She is not followed routinely by cardiology, but did have an in-patient consultation with Harold Hedge, MD on 01/01/20 during admission for chest pain. Stress test was non-ischemic. Echo showed normal LV/RV function, EF 60-65%, normal PASP, trivial MR.    LABS: Labs on arrival as indicated. Most recent lab results include: Lab Results  Component Value Date   WBC 7.1 10/22/2021   HGB 12.7 10/22/2021   HCT 40.3 10/22/2021   PLT 247 10/22/2021   GLUCOSE 91 10/22/2021   NA 140 10/22/2021   K 3.8 10/22/2021   CL 108 10/22/2021   CREATININE 0.97 10/22/2021   BUN 14 10/22/2021   CO2 25 10/22/2021  Last A1c noted was 6.2% on 02/15/18--reported pre-DM history.   IMAGES: MRI Right Knee 09/11/21: IMPRESSION: 1. Large complex tear of the posterior horn-body junction of the medial meniscus. Complex tear of the posterior horn of the medial meniscus. Peripheral meniscal extrusion. 2. Tricompartmental cartilage abnormalities as described above, most severe in the medial femorotibial compartment.  Xray Left Knee 05/29/20: IMPRESSION: 1. Moderate severe tricompartmental degenerative changes. No acute bony abnormality. 2. Small joint effusion. 3. 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    EKG: 10/22/21: Normal sinus rhythm Left axis deviation Nonspecific T wave abnormality Abnormal ECG When compared with ECG of 02-Jan-2020 06:07, No significant change was found Confirmed by Sherryl Manges [phone]) on 10/22/2021 3:03:58 PM   CV: LLE Venous US 09/24/21: IMPRESSION: 1. No evidence of acute or chronic DVT within the left lower extremity. 2. No sonographic correlate for patient's  area of discomfort involving the left ankle.    Nuclear stress test 01/02/20: There was no ST segment deviation noted during stress. The study is normal. This is a low risk study. The left ventricular ejection fraction is normal (55-65%).  Negative lexisca stress. Lv function  normal No ischemia. Low risk study   Echo 01/01/20: IMPRESSIONS   1. Left ventricular ejection fraction, by estimation, is 60 to 65%. The  left ventricle has normal function. The left ventricle has no regional  wall motion abnormalities. Left ventricular diastolic parameters are  consistent with Grade I diastolic  dysfunction (impaired relaxation).   2. Right ventricular systolic function is normal. The right ventricular  size is normal. There is normal pulmonary artery systolic pressure.   3. The mitral valve is normal in structure. Trivial mitral valve  regurgitation.   4. The aortic valve is normal in structure. Aortic valve regurgitation is  not visualized.    Past Medical History:  Diagnosis Date   DVT (deep venous thrombosis) (HCC)    DVT (deep venous thrombosis) (HCC) 2013   Dyspnea    GERD (gastroesophageal reflux disease)    History of hypertension    Hypertension    Peripheral vascular disease (HCC)    DVT   Pre-diabetes    Pulmonary embolism (HCC) 2013    Past Surgical History:  Procedure Laterality Date   APPENDECTOMY     TONSILLECTOMY AND ADENOIDECTOMY      MEDICATIONS:  chlorhexidine (PERIDEX) 0.12 % solution 15 mL   Or   Oral care mouth rinse   lactated ringers infusion    acetaminophen (TYLENOL) 500 MG tablet   apixaban (ELIQUIS) 5 MG TABS tablet   calcium carbonate (TUMS - DOSED IN MG ELEMENTAL CALCIUM) 500 MG chewable tablet   HYDROcodone-acetaminophen (NORCO) 5-325 MG tablet   loratadine (CLARITIN) 10 MG tablet   Menthol, Topical Analgesic, (BIOFREEZE EX)   Multiple Vitamin (MULTIVITAMIN WITH MINERALS) TABS tablet   enoxaparin (LOVENOX) 100 MG/ML injection   lidocaine (LIDODERM) 5 %    Shonna Chock, PA-C Surgical Short Stay/Anesthesiology [facility] Phone [phone] [facility] Phone [phone] 12/08/2021 9:53 AM

## 2021-12-09 ENCOUNTER — Ambulatory Visit (HOSPITAL_COMMUNITY): Payer: No Typology Code available for payment source | Admitting: Vascular Surgery

## 2021-12-09 ENCOUNTER — Observation Stay (HOSPITAL_COMMUNITY)
Admission: RE | Admit: 2021-12-09 | Discharge: 2021-12-11 | Disposition: A | Discharge status: Home Health | Payer: No Typology Code available for payment source | Attending: Orthopedic Surgery | Admitting: Orthopedic Surgery

## 2021-12-09 ENCOUNTER — Ambulatory Visit (HOSPITAL_BASED_OUTPATIENT_CLINIC_OR_DEPARTMENT_OTHER): Payer: No Typology Code available for payment source | Admitting: Vascular Surgery

## 2021-12-09 ENCOUNTER — Other Ambulatory Visit: Payer: Self-pay

## 2021-12-09 ENCOUNTER — Encounter (HOSPITAL_COMMUNITY)
Admission: RE | Disposition: A | Discharge status: Home Health | Payer: Self-pay | Source: Home / Self Care | Attending: Orthopedic Surgery

## 2021-12-09 ENCOUNTER — Encounter (HOSPITAL_COMMUNITY): Payer: Self-pay | Admitting: Orthopedic Surgery

## 2021-12-09 DIAGNOSIS — Z6832 Body mass index (BMI) 32.0-32.9, adult: Secondary | ICD-10-CM

## 2021-12-09 DIAGNOSIS — Z7901 Long term (current) use of anticoagulants: Secondary | ICD-10-CM | POA: Insufficient documentation

## 2021-12-09 DIAGNOSIS — E669 Obesity, unspecified: Secondary | ICD-10-CM

## 2021-12-09 DIAGNOSIS — Z86718 Personal history of other venous thrombosis and embolism: Secondary | ICD-10-CM | POA: Insufficient documentation

## 2021-12-09 DIAGNOSIS — Z96652 Presence of left artificial knee joint: Secondary | ICD-10-CM

## 2021-12-09 DIAGNOSIS — Z79899 Other long term (current) drug therapy: Secondary | ICD-10-CM | POA: Insufficient documentation

## 2021-12-09 DIAGNOSIS — I10 Essential (primary) hypertension: Secondary | ICD-10-CM | POA: Insufficient documentation

## 2021-12-09 DIAGNOSIS — M1712 Unilateral primary osteoarthritis, left knee: Principal | ICD-10-CM | POA: Insufficient documentation

## 2021-12-09 HISTORY — DX: Unspecified osteoarthritis, unspecified site: M19.90

## 2021-12-09 HISTORY — DX: Family history of other specified conditions: Z84.89

## 2021-12-09 HISTORY — DX: Personal history of other medical treatment: Z92.89

## 2021-12-09 HISTORY — PX: TOTAL KNEE ARTHROPLASTY: SHX125

## 2021-12-09 LAB — BASIC METABOLIC PANEL
Anion gap: 8 (ref 5–15)
BUN: 13 mg/dL (ref 8–23)
CO2: 24 mmol/L (ref 22–32)
Calcium: 9.6 mg/dL (ref 8.9–10.3)
Chloride: 110 mmol/L (ref 98–111)
Creatinine, Ser: 1.04 mg/dL — ABNORMAL HIGH (ref 0.44–1.00)
GFR, Estimated: 58 mL/min — ABNORMAL LOW (ref >60)
Glucose, Bld: 94 mg/dL (ref 70–99)
Potassium: 4.2 mmol/L (ref 3.5–5.1)
Sodium: 142 mmol/L (ref 135–145)

## 2021-12-09 LAB — CBC
HCT: 40.1 % (ref 36.0–46.0)
Hemoglobin: 12.6 g/dL (ref 12.0–15.0)
MCH: 26.8 pg (ref 26.0–34.0)
MCHC: 31.4 g/dL (ref 30.0–36.0)
MCV: 85.3 fL (ref 80.0–100.0)
Platelets: 228 10*3/uL (ref 150–400)
RBC: 4.7 MIL/uL (ref 3.87–5.11)
RDW: 14.7 % (ref 11.5–15.5)
WBC: 8.5 10*3/uL (ref 4.0–10.5)
nRBC: 0 % (ref 0.0–0.2)

## 2021-12-09 LAB — SURGICAL PCR SCREEN
MRSA, PCR: NEGATIVE
Staphylococcus aureus: NEGATIVE

## 2021-12-09 SURGERY — ARTHROPLASTY, KNEE, TOTAL
Anesthesia: General | Site: Knee | Laterality: Left

## 2021-12-09 MED ORDER — DOCUSATE SODIUM 100 MG PO CAPS
100.0000 mg | ORAL_CAPSULE | Freq: Two times a day (BID) | ORAL | Status: DC
  Administered 2021-12-09 – 2021-12-11 (×4): 100 mg via ORAL
  Filled 2021-12-09 (×4): qty 1

## 2021-12-09 MED ORDER — APIXABAN 5 MG PO TABS
5.0000 mg | ORAL_TABLET | Freq: Two times a day (BID) | ORAL | Status: DC
  Administered 2021-12-09 – 2021-12-11 (×4): 5 mg via ORAL
  Filled 2021-12-09 (×4): qty 1

## 2021-12-09 MED ORDER — METOCLOPRAMIDE HCL 5 MG PO TABS: 5.0000 mg | ORAL_TABLET | Freq: Three times a day (TID) | ORAL | Status: DC | PRN

## 2021-12-09 MED ORDER — LABETALOL HCL 5 MG/ML IV SOLN
INTRAVENOUS | Status: DC | PRN
  Administered 2021-12-09: 2.5 mg via INTRAVENOUS
  Administered 2021-12-09: 5 mg via INTRAVENOUS

## 2021-12-09 MED ORDER — MAGNESIUM CITRATE PO SOLN: 1.0000 | Freq: Once | ORAL | Status: DC | PRN

## 2021-12-09 MED ORDER — ROCURONIUM BROMIDE 100 MG/10ML IV SOLN
INTRAVENOUS | Status: DC | PRN
  Administered 2021-12-09: 80 mg via INTRAVENOUS

## 2021-12-09 MED ORDER — ONDANSETRON HCL 4 MG/2ML IJ SOLN
4.0000 mg | Freq: Once | INTRAMUSCULAR | Status: AC | PRN
  Administered 2021-12-09: 4 mg via INTRAVENOUS

## 2021-12-09 MED ORDER — HYDROMORPHONE HCL 1 MG/ML IJ SOLN
INTRAMUSCULAR | Status: AC
  Filled 2021-12-09: qty 0.5

## 2021-12-09 MED ORDER — 0.9 % SODIUM CHLORIDE (POUR BTL) OPTIME
TOPICAL | Status: DC | PRN
  Administered 2021-12-09: 1000 mL

## 2021-12-09 MED ORDER — ROCURONIUM BROMIDE 10 MG/ML (PF) SYRINGE
PREFILLED_SYRINGE | INTRAVENOUS | Status: AC
Start: 2021-12-09 — End: ?
  Filled 2021-12-09: qty 10

## 2021-12-09 MED ORDER — ONDANSETRON HCL 4 MG/2ML IJ SOLN
4.0000 mg | Freq: Four times a day (QID) | INTRAMUSCULAR | Status: DC | PRN
  Administered 2021-12-09: 4 mg via INTRAVENOUS
  Filled 2021-12-09: qty 2

## 2021-12-09 MED ORDER — CEFAZOLIN SODIUM-DEXTROSE 2-4 GM/100ML-% IV SOLN
2.0000 g | Freq: Four times a day (QID) | INTRAVENOUS | Status: AC
  Administered 2021-12-09 (×2): 2 g via INTRAVENOUS
  Filled 2021-12-09 (×2): qty 100

## 2021-12-09 MED ORDER — BISACODYL 10 MG RE SUPP: 10.0000 mg | Freq: Every day | RECTAL | Status: DC | PRN

## 2021-12-09 MED ORDER — ESMOLOL HCL-SODIUM CHLORIDE 2000 MG/100ML IV SOLN
INTRAVENOUS | Status: DC | PRN
  Administered 2021-12-09: 20 mg via INTRAVENOUS

## 2021-12-09 MED ORDER — FENTANYL CITRATE (PF) 250 MCG/5ML IJ SOLN
INTRAMUSCULAR | Status: AC
  Filled 2021-12-09: qty 5

## 2021-12-09 MED ORDER — PHENOL 1.4 % MT LIQD: 1.0000 | OROMUCOSAL | Status: DC | PRN

## 2021-12-09 MED ORDER — HYDROMORPHONE HCL 1 MG/ML IJ SOLN
INTRAMUSCULAR | Status: AC
  Filled 2021-12-09: qty 1

## 2021-12-09 MED ORDER — ONDANSETRON HCL 4 MG/2ML IJ SOLN
INTRAMUSCULAR | Status: AC
Start: 2021-12-09 — End: ?
  Filled 2021-12-09: qty 2

## 2021-12-09 MED ORDER — CHLORHEXIDINE GLUCONATE 0.12 % MT SOLN
15.0000 mL | OROMUCOSAL | Status: AC
  Administered 2021-12-09: 15 mL via OROMUCOSAL
  Filled 2021-12-09 (×2): qty 15

## 2021-12-09 MED ORDER — POVIDONE-IODINE 10 % EX SWAB
2.0000 | Freq: Once | CUTANEOUS | Status: DC
  Administered 2021-12-09: 2 via TOPICAL

## 2021-12-09 MED ORDER — POLYETHYLENE GLYCOL 3350 17 G PO PACK: 17.0000 g | PACK | Freq: Every day | ORAL | Status: DC | PRN

## 2021-12-09 MED ORDER — ONDANSETRON HCL 4 MG/2ML IJ SOLN
INTRAMUSCULAR | Status: DC | PRN
  Administered 2021-12-09: 4 mg via INTRAVENOUS

## 2021-12-09 MED ORDER — ONDANSETRON HCL 4 MG PO TABS: 4.0000 mg | ORAL_TABLET | Freq: Four times a day (QID) | ORAL | Status: DC | PRN

## 2021-12-09 MED ORDER — PROPOFOL 10 MG/ML IV BOLUS
INTRAVENOUS | Status: AC
  Filled 2021-12-09: qty 20

## 2021-12-09 MED ORDER — DEXAMETHASONE SODIUM PHOSPHATE 10 MG/ML IJ SOLN
INTRAMUSCULAR | Status: DC | PRN
  Administered 2021-12-09: 10 mg via INTRAVENOUS

## 2021-12-09 MED ORDER — FENTANYL CITRATE (PF) 100 MCG/2ML IJ SOLN
INTRAMUSCULAR | Status: DC | PRN
  Administered 2021-12-09: 50 ug via INTRAVENOUS
  Administered 2021-12-09 (×2): 100 ug via INTRAVENOUS

## 2021-12-09 MED ORDER — MENTHOL 3 MG MT LOZG
1.0000 | LOZENGE | OROMUCOSAL | Status: DC | PRN
Start: 2021-12-09 — End: 2021-12-11
  Administered 2021-12-09 – 2021-12-10 (×2): 3 mg via ORAL
  Filled 2021-12-09 (×2): qty 9

## 2021-12-09 MED ORDER — LACTATED RINGERS IV SOLN: INTRAVENOUS | Status: DC

## 2021-12-09 MED ORDER — DEXMEDETOMIDINE (PRECEDEX) IN NS 20 MCG/5ML (4 MCG/ML) IV SYRINGE
PREFILLED_SYRINGE | INTRAVENOUS | Status: DC | PRN
  Administered 2021-12-09: 8 ug via INTRAVENOUS
  Administered 2021-12-09: 4 ug via INTRAVENOUS

## 2021-12-09 MED ORDER — LIDOCAINE 2% (20 MG/ML) 5 ML SYRINGE
INTRAMUSCULAR | Status: AC
  Filled 2021-12-09: qty 5

## 2021-12-09 MED ORDER — PROPOFOL 10 MG/ML IV BOLUS
INTRAVENOUS | Status: DC | PRN
  Administered 2021-12-09: 160 mg via INTRAVENOUS

## 2021-12-09 MED ORDER — SUGAMMADEX SODIUM 200 MG/2ML IV SOLN
INTRAVENOUS | Status: DC | PRN
  Administered 2021-12-09: 200 mg via INTRAVENOUS

## 2021-12-09 MED ORDER — DEXMEDETOMIDINE HCL IN NACL 80 MCG/20ML IV SOLN
INTRAVENOUS | Status: AC
  Filled 2021-12-09: qty 20

## 2021-12-09 MED ORDER — SODIUM CHLORIDE 0.9 % IR SOLN
Status: DC | PRN
  Administered 2021-12-09: 3000 mL

## 2021-12-09 MED ORDER — LABETALOL HCL 5 MG/ML IV SOLN
INTRAVENOUS | Status: AC
Start: 2021-12-09 — End: ?
  Filled 2021-12-09: qty 4

## 2021-12-09 MED ORDER — HYDROMORPHONE HCL 1 MG/ML IJ SOLN
0.2500 mg | INTRAMUSCULAR | Status: DC | PRN
  Administered 2021-12-09 (×2): 0.25 mg via INTRAVENOUS

## 2021-12-09 MED ORDER — SODIUM CHLORIDE 0.9 % IV SOLN: INTRAVENOUS | Status: DC

## 2021-12-09 MED ORDER — OXYCODONE HCL 5 MG PO TABS
10.0000 mg | ORAL_TABLET | ORAL | Status: DC | PRN
  Administered 2021-12-09: 10 mg via ORAL
  Administered 2021-12-10 – 2021-12-11 (×2): 15 mg via ORAL
  Filled 2021-12-09: qty 2
  Filled 2021-12-09 (×2): qty 3
  Filled 2021-12-09: qty 2

## 2021-12-09 MED ORDER — MIDAZOLAM HCL 5 MG/5ML IJ SOLN
INTRAMUSCULAR | Status: DC | PRN
  Administered 2021-12-09: 1 mg via INTRAVENOUS

## 2021-12-09 MED ORDER — METHOCARBAMOL 500 MG PO TABS
500.0000 mg | ORAL_TABLET | Freq: Four times a day (QID) | ORAL | Status: DC | PRN
  Administered 2021-12-09 – 2021-12-11 (×4): 500 mg via ORAL
  Filled 2021-12-09 (×4): qty 1

## 2021-12-09 MED ORDER — OXYCODONE HCL 5 MG PO TABS
5.0000 mg | ORAL_TABLET | Freq: Once | ORAL | Status: AC | PRN
  Administered 2021-12-09: 5 mg via ORAL

## 2021-12-09 MED ORDER — TRANEXAMIC ACID 1000 MG/10ML IV SOLN
2000.0000 mg | INTRAVENOUS | Status: DC
  Administered 2021-12-09: 2000 mg via TOPICAL
  Filled 2021-12-09: qty 20

## 2021-12-09 MED ORDER — HYDROMORPHONE HCL 1 MG/ML IJ SOLN
0.5000 mg | INTRAMUSCULAR | Status: DC | PRN
  Administered 2021-12-09 – 2021-12-11 (×4): 1 mg via INTRAVENOUS
  Filled 2021-12-09 (×4): qty 1

## 2021-12-09 MED ORDER — ACETAMINOPHEN 10 MG/ML IV SOLN
INTRAVENOUS | Status: AC
  Filled 2021-12-09: qty 100

## 2021-12-09 MED ORDER — OXYCODONE HCL 5 MG/5ML PO SOLN: 5.0000 mg | Freq: Once | ORAL | Status: AC | PRN

## 2021-12-09 MED ORDER — MIDAZOLAM HCL 2 MG/2ML IJ SOLN
INTRAMUSCULAR | Status: AC
Start: 2021-12-09 — End: ?
  Filled 2021-12-09: qty 2

## 2021-12-09 MED ORDER — CEFAZOLIN SODIUM-DEXTROSE 2-4 GM/100ML-% IV SOLN
2.0000 g | INTRAVENOUS | Status: AC
  Administered 2021-12-09: 2 g via INTRAVENOUS
  Filled 2021-12-09: qty 100

## 2021-12-09 MED ORDER — ACETAMINOPHEN 325 MG PO TABS: 325.0000 mg | ORAL_TABLET | Freq: Four times a day (QID) | ORAL | Status: DC | PRN

## 2021-12-09 MED ORDER — OXYCODONE HCL 5 MG PO TABS
ORAL_TABLET | ORAL | Status: AC
  Filled 2021-12-09: qty 1

## 2021-12-09 MED ORDER — TRANEXAMIC ACID 1000 MG/10ML IV SOLN
INTRAVENOUS | Status: DC | PRN
  Administered 2021-12-09: 2000 mg via TOPICAL

## 2021-12-09 MED ORDER — OXYCODONE HCL 5 MG PO TABS
5.0000 mg | ORAL_TABLET | ORAL | Status: DC | PRN
  Administered 2021-12-10 – 2021-12-11 (×3): 10 mg via ORAL
  Filled 2021-12-09: qty 1
  Filled 2021-12-09 (×2): qty 2

## 2021-12-09 MED ORDER — METOCLOPRAMIDE HCL 5 MG/ML IJ SOLN
5.0000 mg | Freq: Three times a day (TID) | INTRAMUSCULAR | Status: DC | PRN
  Administered 2021-12-09: 10 mg via INTRAVENOUS
  Filled 2021-12-09: qty 2

## 2021-12-09 MED ORDER — ONDANSETRON HCL 4 MG/2ML IJ SOLN
INTRAMUSCULAR | Status: AC
  Filled 2021-12-09: qty 2

## 2021-12-09 MED ORDER — LIDOCAINE 2% (20 MG/ML) 5 ML SYRINGE
INTRAMUSCULAR | Status: DC | PRN
  Administered 2021-12-09: 100 mg via INTRAVENOUS

## 2021-12-09 MED ORDER — ACETAMINOPHEN 10 MG/ML IV SOLN
INTRAVENOUS | Status: DC | PRN
  Administered 2021-12-09: 1000 mg via INTRAVENOUS

## 2021-12-09 MED ORDER — TRANEXAMIC ACID-NACL 1000-0.7 MG/100ML-% IV SOLN
1000.0000 mg | INTRAVENOUS | Status: AC
  Administered 2021-12-09: 1000 mg via INTRAVENOUS
  Filled 2021-12-09: qty 100

## 2021-12-09 MED ORDER — DIPHENHYDRAMINE HCL 12.5 MG/5ML PO ELIX: 12.5000 mg | ORAL_SOLUTION | ORAL | Status: DC | PRN

## 2021-12-09 MED ORDER — METHOCARBAMOL 1000 MG/10ML IJ SOLN: 500.0000 mg | Freq: Four times a day (QID) | INTRAVENOUS | Status: DC | PRN

## 2021-12-09 MED ORDER — DEXAMETHASONE SODIUM PHOSPHATE 10 MG/ML IJ SOLN
INTRAMUSCULAR | Status: AC
  Filled 2021-12-09: qty 1

## 2021-12-09 MED ORDER — ROPIVACAINE HCL 7.5 MG/ML IJ SOLN
INTRAMUSCULAR | Status: DC | PRN
  Administered 2021-12-09: 20 mL via PERINEURAL

## 2021-12-09 MED ORDER — HYDROMORPHONE HCL 1 MG/ML IJ SOLN
INTRAMUSCULAR | Status: DC | PRN
  Administered 2021-12-09: .5 mg via INTRAVENOUS

## 2021-12-09 SURGICAL SUPPLY — 59 items
BAG COUNTER SPONGE SURGICOUNT (BAG) ×2 IMPLANT
BLADE SAGITTAL 25.0X1.19X90 (BLADE) ×2 IMPLANT
BLADE SAW SGTL 13X75X1.27 (BLADE) ×2 IMPLANT
BLADE SURG 21 STRL SS (BLADE) ×4 IMPLANT
BNDG COHESIVE 6X5 TAN STRL LF (GAUZE/BANDAGES/DRESSINGS) ×4 IMPLANT
BNDG GAUZE ELAST 4 BULKY (GAUZE/BANDAGES/DRESSINGS) ×2 IMPLANT
BOWL SMART MIX CTS (DISPOSABLE) ×2 IMPLANT
CEMENT BONE R 1X40 (Cement) ×4 IMPLANT
COMP FEM CEMT PERSONA STD SZ7 (Knees) ×2 IMPLANT
COMPONENT FEM CMT PRSN STD SZ7 (Knees) IMPLANT
COOLER ICEMAN CLASSIC (MISCELLANEOUS) ×2 IMPLANT
COVER SURGICAL LIGHT HANDLE (MISCELLANEOUS) ×2 IMPLANT
CUFF TOURN SGL QUICK 34 (TOURNIQUET CUFF) ×1
CUFF TOURN SGL QUICK 42 (TOURNIQUET CUFF) IMPLANT
CUFF TRNQT CYL 34X4.125X (TOURNIQUET CUFF) ×1 IMPLANT
DRAPE EXTREMITY T 121X128X90 (DISPOSABLE) ×2 IMPLANT
DRAPE HALF SHEET 40X57 (DRAPES) ×4 IMPLANT
DRAPE U-SHAPE 47X51 STRL (DRAPES) ×2 IMPLANT
DRSG ADAPTIC 3X8 NADH LF (GAUZE/BANDAGES/DRESSINGS) ×2 IMPLANT
DRSG EMULSION OIL 3X16 NADH (GAUZE/BANDAGES/DRESSINGS) ×1 IMPLANT
DRSG PAD ABDOMINAL 8X10 ST (GAUZE/BANDAGES/DRESSINGS) ×2 IMPLANT
DURAPREP 26ML APPLICATOR (WOUND CARE) ×2 IMPLANT
ELECT PENCIL ROCKER SW 15FT (MISCELLANEOUS) ×1 IMPLANT
ELECT REM PT RETURN 9FT ADLT (ELECTROSURGICAL) ×2
ELECTRODE REM PT RTRN 9FT ADLT (ELECTROSURGICAL) ×1 IMPLANT
FACESHIELD WRAPAROUND (MASK) ×2 IMPLANT
FACESHIELD WRAPAROUND OR TEAM (MASK) ×1 IMPLANT
GAUZE SPONGE 4X4 12PLY STRL (GAUZE/BANDAGES/DRESSINGS) ×2 IMPLANT
GLOVE BIOGEL PI IND STRL 9 (GLOVE) ×1 IMPLANT
GLOVE BIOGEL PI INDICATOR 9 (GLOVE) ×1
GLOVE SURG ORTHO 9.0 STRL STRW (GLOVE) ×2 IMPLANT
GOWN STRL REUS W/ TWL XL LVL3 (GOWN DISPOSABLE) ×2 IMPLANT
GOWN STRL REUS W/TWL XL LVL3 (GOWN DISPOSABLE) ×2
HANDPIECE INTERPULSE COAX TIP (DISPOSABLE) ×1
HDLS TROCR DRIL PIN KNEE 75 (PIN) ×1
KIT BASIN OR (CUSTOM PROCEDURE TRAY) ×2 IMPLANT
KIT TURNOVER KIT B (KITS) ×2 IMPLANT
MANIFOLD NEPTUNE II (INSTRUMENTS) ×2 IMPLANT
NS IRRIG 1000ML POUR BTL (IV SOLUTION) ×2 IMPLANT
PACK TOTAL JOINT (CUSTOM PROCEDURE TRAY) ×2 IMPLANT
PAD ABD 7.5X8 STRL (GAUZE/BANDAGES/DRESSINGS) ×1 IMPLANT
PAD ARMBOARD 7.5X6 YLW CONV (MISCELLANEOUS) ×2 IMPLANT
PAD COLD SHLDR WRAP-ON (PAD) ×2 IMPLANT
PIN DRILL HDLS TROCAR 75 4PK (PIN) IMPLANT
SCREW FEMALE HEX FIX 25X2.5 (ORTHOPEDIC DISPOSABLE SUPPLIES) ×1 IMPLANT
SET HNDPC FAN SPRY TIP SCT (DISPOSABLE) ×1 IMPLANT
STAPLER VISISTAT 35W (STAPLE) ×2 IMPLANT
STEM POLY PAT PLY 32M KNEE (Knees) ×1 IMPLANT
STEM TIBIA 5 DEG SZ F L KNEE (Knees) IMPLANT
STEM TIBIAL SZ6-7 EF 14 LT (Joint) ×1 IMPLANT
SUCTION FRAZIER HANDLE 10FR (MISCELLANEOUS)
SUCTION TUBE FRAZIER 10FR DISP (MISCELLANEOUS) IMPLANT
SUT VIC AB 0 CT1 27 (SUTURE) ×4
SUT VIC AB 0 CT1 27XBRD ANBCTR (SUTURE) ×1 IMPLANT
SUT VIC AB 1 CTX 36 (SUTURE)
SUT VIC AB 1 CTX36XBRD ANBCTR (SUTURE) IMPLANT
TIBIA STEM 5 DEG SZ F L KNEE (Knees) ×2 IMPLANT
TOWEL GREEN STERILE (TOWEL DISPOSABLE) ×2 IMPLANT
TOWEL GREEN STERILE FF (TOWEL DISPOSABLE) ×2 IMPLANT

## 2021-12-09 NOTE — Discharge Instructions (Addendum)

## 2021-12-09 NOTE — Anesthesia Postprocedure Evaluation (Signed)
Anesthesia Post Note  Patient: Leah Roberts  Procedure(s) Performed: LEFT TOTAL KNEE ARTHROPLASTY (Left: Knee)     Patient location during evaluation: PACU Anesthesia Type: General Level of consciousness: awake and alert and oriented Pain management: pain level controlled Vital Signs Assessment: post-procedure vital signs reviewed and stable Respiratory status: spontaneous breathing, nonlabored ventilation and respiratory function stable Cardiovascular status: blood pressure returned to baseline and stable Postop Assessment: no apparent nausea or vomiting Anesthetic complications: no   No notable events documented.  Last Vitals:  Vitals:   12/09/21 1030 12/09/21 1045  BP: (!) 153/94 (!) 144/95  Pulse: 62 62  Resp: (!) 9 (!) 9  Temp:    SpO2: 96% 95%    Last Pain:  Vitals:   12/09/21 1030  TempSrc:   PainSc: Asleep                 Fernande Treiber A.

## 2021-12-09 NOTE — Transfer of Care (Signed)
Immediate Anesthesia Transfer of Care Note  Patient: Leah Roberts  Procedure(s) Performed: LEFT TOTAL KNEE ARTHROPLASTY (Left: Knee)  Patient Location: PACU  Anesthesia Type:General and Regional  Level of Consciousness: drowsy and patient cooperative  Airway & Oxygen Therapy: Patient Spontanous Breathing and Patient connected to face mask oxygen  Post-op Assessment: Report given to RN and Post -op Vital signs reviewed and stable  Post vital signs: Reviewed and stable  Last Vitals:  Vitals Value Taken Time  BP    Temp    Pulse    Resp    SpO2      Last Pain:  Vitals:   12/09/21 0733  TempSrc:   PainSc: 0-No pain      Patients Stated Pain Goal: 0 (12/09/21 0733)  Complications: No notable events documented.

## 2021-12-09 NOTE — Op Note (Addendum)
DATE OF SURGERY:  12/09/2021  TIME: 10:15 AM  PATIENT NAME:  Leah Roberts    AGE: 71 y.o.    PRE-OPERATIVE DIAGNOSIS:  Osteoarthritis Left Knee  POST-OPERATIVE DIAGNOSIS:  Osteoarthritis Left Knee  PROCEDURE:  Procedure(s): LEFT TOTAL KNEE ARTHROPLASTY  SURGEON: Aldean Baker  ASSISTANT: joey dixon  OPERATIVE IMPLANTS: Zimmer Persona.  Femur size 7, Tibia size F, Patella size 35 3-peg oval button, with a 14 mm polyethylene insert medial congruent.  @ENCIMAGES @     PREOPERATIVE INDICATIONS:   Leah Roberts is a 71 y.o. year old female with end stage degenerative arthritis of the knee who failed conservative treatment and elected for Total Knee Arthroplasty.   The risks, benefits, and alternatives were discussed at length including but not limited to the risks of infection, bleeding, nerve injury, stiffness, blood clots, the need for revision surgery, cardiopulmonary complications, among others, and they were willing to proceed.  OPERATIVE DESCRIPTION:  The patient was brought to the operative room and placed in a supine position.  General anesthesia was administered.  IV antibiotics were given.  The lower extremity was prepped and draped in the usual sterile fashion.  78 was used to cover all exposed skin. Time out was performed.    Anterior quadriceps tendon splitting approach was performed.  The patella was everted and osteophytes were removed.  The anterior horn of the medial and lateral meniscus was removed.   The distal femur was opened with the drill and the intramedullary distal femoral cutting jig was utilized, set at 5 degrees valgus resecting 9 mm off the distal femur.  Care was taken to protect the collateral ligaments.  Then the extramedullary tibial cutting jig was utilized set for 3 degree posterior slope.  Care was taken during the cut to protect the medial and collateral ligaments.  The proximal tibia was removed along with the posterior horns of  the menisci.  The PCL was sacrificed.    The extensor gap was measured and was approximately 14 mm.    The distal femoral sizing jig was applied, taking care to avoid notching.  Then the 4-in-1 cutting jig was applied and the anterior and posterior femur was cut, along with the chamfer cuts.  All posterior osteophytes were removed.  The flexion gap was then measured and was symmetric with the extension gap.  The distal femoral preparation using the appropriate jig to prepare the box.  The patella was then measured, and cut with the saw.    The proximal tibia sized and prepared accordingly with the reamer and the punch, and then all components were trialed with the poly insert.  The knee was found to have stable balance and full motion.  The knee was irrigated with normal saline and the knee was soaked with TXA.  The above named components were then cemented into place and all excess cement was removed.  The final polyethylene component was in place during cementation.  The knee was kept in extension until the cement hardened.  The knee was then taken through a range of motion and the patella tracked well and the knee irrigated copiously and the parapatellar and subcutaneous tissue closed with vicryl, and skin closed with staples..  A sterile dressing was applied and patient  was taken to the PACU in stable  condition.  There were no complications.  Total tourniquet time was 40 minutes.

## 2021-12-09 NOTE — Anesthesia Procedure Notes (Addendum)
  Anesthesia Regional Block: Adductor canal block   Pre-Anesthetic Checklist: , timeout performed,  Correct Patient, Correct Site, Correct Laterality,  Correct Procedure, Correct Position, site marked,  Risks and benefits discussed,  Surgical consent,  Pre-op evaluation,  At surgeon's request and post-op pain management  Laterality: Left  Prep: chloraprep       Needles:  Injection technique: Single-shot  Needle Type: Echogenic Stimulator Needle     Needle Length: 10cm  Needle Gauge: 21   Needle insertion depth: 7 cm   Additional Needles:   Procedures:,,,, ultrasound used (permanent image in chart),,    Narrative:  Start time: 12/09/2021 7:54 AM End time: 12/09/2021 7:59 AM Injection made incrementally with aspirations every 5 mL.  Performed by: Personally  Anesthesiologist: Mal Amabile, MD  Additional Notes: Timeout performed. Patient sedated. Relevant anatomy ID'd using [place]. Incremental 2-38ml injection of LA with frequent aspiration. Patient tolerated procedure well.

## 2021-12-09 NOTE — Anesthesia Procedure Notes (Signed)
Procedure Name: Intubation Date/Time: 12/09/2021 8:39 AM  Performed by: Marny Lowenstein, CRNAPre-anesthesia Checklist: Patient identified, Emergency Drugs available, Suction available and Patient being monitored Patient Re-evaluated:Patient Re-evaluated prior to induction Oxygen Delivery Method: Circle system utilized Preoxygenation: Pre-oxygenation with 100% oxygen Induction Type: IV induction Ventilation: Mask ventilation without difficulty Laryngoscope Size: Miller and 2 Grade View: Grade I Tube type: Oral Tube size: 7.0 mm Number of attempts: 1 Airway Equipment and Method: Patient positioned with wedge pillow and Stylet Placement Confirmation: ETT inserted through vocal cords under direct vision, positive ETCO2 and breath sounds checked- equal and bilateral Secured at: 22 cm Tube secured with: Tape Dental Injury: Teeth and Oropharynx as per pre-operative assessment

## 2021-12-09 NOTE — Interval H&P Note (Signed)
History and Physical Interval Note:  12/09/2021 6:59 AM  Leah Roberts  has presented today for surgery, with the diagnosis of Osteoarthritis Left Knee.  The various methods of treatment have been discussed with the patient and family. After consideration of risks, benefits and other options for treatment, the patient has consented to  Procedure(s): LEFT TOTAL KNEE ARTHROPLASTY (Left) as a surgical intervention.  The patient's history has been reviewed, patient examined, no change in status, stable for surgery.  I have reviewed the patient's chart and labs.  Questions were answered to the patient's satisfaction.     Nadara Mustard

## 2021-12-10 ENCOUNTER — Other Ambulatory Visit: Payer: Self-pay

## 2021-12-10 ENCOUNTER — Encounter (HOSPITAL_COMMUNITY): Payer: Self-pay | Admitting: Orthopedic Surgery

## 2021-12-10 MED ORDER — OXYCODONE-ACETAMINOPHEN 5-325 MG PO TABS
1.0000 | ORAL_TABLET | ORAL | 0 refills | Status: DC | PRN
  Filled 2021-12-10: qty 30, 5d supply, fill #0

## 2021-12-10 NOTE — Discharge Summary (Cosign Needed Addendum)
Discharge Diagnoses:  Principal Problem:   Total knee replacement status, left Active Problems:   Unilateral primary osteoarthritis, left knee   Surgeries: Procedure(s): LEFT TOTAL KNEE ARTHROPLASTY on 12/09/2021    Consultants:   Discharged Condition: Improved  Hospital Course: Leah Roberts is an 71 y.o. female who was admitted 12/09/2021 with a chief complaint of osteoarthritis left knee, with a final diagnosis of Osteoarthritis Left Knee.  Patient was brought to the operating room on 12/09/2021 and underwent Procedure(s): LEFT TOTAL KNEE ARTHROPLASTY.    Patient was given perioperative antibiotics:  Anti-infectives (From admission, onward)    Start     Dose/Rate Route Frequency Ordered Stop   12/09/21 1700  ceFAZolin (ANCEF) IVPB 2g/100 mL premix        2 g 200 mL/hr over 30 Minutes Intravenous Every 6 hours 12/09/21 1204 12/09/21 2308   12/09/21 0700  ceFAZolin (ANCEF) IVPB 2g/100 mL premix        2 g 200 mL/hr over 30 Minutes Intravenous On call to O.R. 12/09/21 6144 12/09/21 0913     .  Patient was given sequential compression devices, early ambulation, and aspirin for DVT prophylaxis.  Recent vital signs: Patient Vitals for the past 24 hrs:  BP Temp Temp src Pulse Resp SpO2  12/09/21 1927 (!) 150/86 98.2 F (36.8 C) -- 93 19 100 %  12/09/21 1325 124/79 98 F (36.7 C) Oral 60 -- 95 %  12/09/21 1206 (!) 144/90 (!) 97.5 F (36.4 C) Oral (!) 56 12 100 %  12/09/21 1145 (!) 144/94 97.7 F (36.5 C) -- 60 11 96 %  12/09/21 1130 132/84 -- -- (!) 56 12 96 %  12/09/21 1115 (!) 164/83 -- -- 61 16 90 %  12/09/21 1100 (!) 130/103 -- -- (!) 56 (!) 9 92 %  12/09/21 1045 (!) 144/95 -- -- 62 (!) 9 95 %  12/09/21 1030 (!) 153/94 -- -- 62 (!) 9 96 %  12/09/21 1015 136/83 -- -- 60 12 96 %  12/09/21 1009 (!) 136/90 97.7 F (36.5 C) -- 61 10 99 %  .  Recent laboratory studies: No results found.  Discharge Medications:   Allergies as of 12/10/2021       Reactions   Sulfa  Antibiotics Swelling, Rash        Medication List     STOP taking these medications    acetaminophen 500 MG tablet Commonly known as: TYLENOL   HYDROcodone-acetaminophen 5-325 MG tablet Commonly known as: Norco       TAKE these medications    BIOFREEZE EX Apply 1 Application topically daily as needed (leg pain).   calcium carbonate 500 MG chewable tablet Commonly known as: TUMS - dosed in mg elemental calcium Chew 1 tablet by mouth daily as needed for indigestion or heartburn.   Eliquis 5 MG Tabs tablet Generic drug: apixaban Take 1 tablet (5 mg total) by mouth 2 (two) times daily.   enoxaparin 100 MG/ML injection Commonly known as: LOVENOX Inject 1 mL (100 mg total) into the skin every 12 (twelve) hours for 2 days. Start 2 days before total knee replacement   lidocaine 5 % Commonly known as: LIDODERM Place 1 patch onto the skin every 12 (twelve) hours. Remove & Discard patch within 12 hours or as directed by MD   loratadine 10 MG tablet Commonly known as: CLARITIN Take 10 mg by mouth daily.   multivitamin with minerals Tabs tablet Take 1 tablet by mouth 2 (two) times a week.  oxyCODONE-acetaminophen 5-325 MG tablet Commonly known as: PERCOCET/ROXICET Take 1 tablet by mouth every 4 (four) hours as needed.               Discharge Care Instructions  (From admission, onward)           Start     Ordered   12/10/21 0000  Weight bearing as tolerated       Question Answer Comment  Laterality left   Extremity Lower      12/10/21 0740            Diagnostic Studies: No results found.  Patient benefited maximally from their hospital stay and there were no complications.     Disposition: Discharge disposition: 01-Home or Self Care      Discharge Instructions     Call MD / Call 911   Complete by: As directed    If you experience chest pain or shortness of breath, CALL 911 and be transported to the hospital emergency room.  If you  develope a fever above 101 F, pus (white drainage) or increased drainage or redness at the wound, or calf pain, call your surgeon's office.   Constipation Prevention   Complete by: As directed    Drink plenty of fluids.  Prune juice may be helpful.  You may use a stool softener, such as Colace (over the counter) 100 mg twice a day.  Use MiraLax (over the counter) for constipation as needed.   Diet - low sodium heart healthy   Complete by: As directed    Increase activity slowly as tolerated   Complete by: As directed    Post-operative opioid taper instructions:   Complete by: As directed    POST-OPERATIVE OPIOID TAPER INSTRUCTIONS: It is important to wean off of your opioid medication as soon as possible. If you do not need pain medication after your surgery it is ok to stop day one. Opioids include: Codeine, Hydrocodone(Norco, Vicodin), Oxycodone(Percocet, oxycontin) and hydromorphone amongst others.  Long term and even short term use of opiods can cause: Increased pain response Dependence Constipation Depression Respiratory depression And more.  Withdrawal symptoms can include Flu like symptoms Nausea, vomiting And more Techniques to manage these symptoms Hydrate well Eat regular healthy meals Stay active Use relaxation techniques(deep breathing, meditating, yoga) Do Not substitute Alcohol to help with tapering If you have been on opioids for less than two weeks and do not have pain than it is ok to stop all together.  Plan to wean off of opioids This plan should start within one week post op of your joint replacement. Maintain the same interval or time between taking each dose and first decrease the dose.  Cut the total daily intake of opioids by one tablet each day Next start to increase the time between doses. The last dose that should be eliminated is the evening dose.      Weight bearing as tolerated   Complete by: As directed    Laterality: left   Extremity: Lower        Follow-up Information     Nadara Mustard, MD Follow up in 1 week(s).   Specialty: Orthopedic Surgery Contact information: [address] [place] [address] [phone]                  Signed: Nadara Mustard 12/10/2021, 7:40 AM

## 2021-12-10 NOTE — Plan of Care (Signed)
  Problem: Education: Goal: Knowledge of General Education information will improve Description: Including pain rating scale, medication(s)/side effects and non-pharmacologic comfort measures Outcome: Not Progressing   Problem: Health Behavior/Discharge Planning: Goal: Ability to manage health-related needs will improve Outcome: Not Progressing   Problem: Clinical Measurements: Goal: Ability to maintain clinical measurements within normal limits will improve Outcome: Not Progressing Goal: Will remain free from infection Outcome: Not Progressing Goal: Diagnostic test results will improve Outcome: Not Progressing Goal: Respiratory complications will improve Outcome: Not Progressing Goal: Cardiovascular complication will be avoided Outcome: Not Progressing   Problem: Coping: Goal: Level of anxiety will decrease Outcome: Not Progressing   Problem: Elimination: Goal: Will not experience complications related to bowel motility Outcome: Not Progressing Goal: Will not experience complications related to urinary retention Outcome: Not Progressing   Problem: Pain Managment: Goal: General experience of comfort will improve Outcome: Not Progressing   Problem: Safety: Goal: Ability to remain free from injury will improve Outcome: Not Progressing   

## 2021-12-10 NOTE — Progress Notes (Addendum)
Physical Therapy Treatment Patient Details Name: Leah Roberts MRN: [id number] DOB: Aug 26, 1950 Today's Date: 12/10/2021   History of Present Illness Pt is a 71 y/o female s/p L TKA. Pt has PMH of HTN, HLD, DVT, unstable angina, anemia, and bronchitis.    PT Comments    Pt with limited progress but able to perform stand pivot transfer from bed to chair. Pt remains limited by pain, weakness, and decreased balance. Pt will continue to benefit from skilled, acute care physical therapy interventions to maximize her current level of function and progress towards established goals. Please see below for discharge recommendations.    Recommendations for follow up therapy are one component of a multi-disciplinary discharge planning process, led by the attending physician.  Recommendations may be updated based on patient status, additional functional criteria and insurance authorization.  Follow Up Recommendations  Other (comment) (Pt will need 24/7 assist at this time upon discharge. Pt's son is leaving Sunday and pt will not have transportation to outpatient physical therapy. Recommend SNF or IPR if pt does not make adequate progress in acute care.)     Assistance Recommended at Discharge Frequent or constant Supervision/Assistance  Patient can return home with the following A lot of help with walking and/or transfers;A lot of help with bathing/dressing/bathroom;Assistance with cooking/housework;Assist for transportation   Equipment Recommendations  Rolling walker (2 wheels);BSC/3in1    Recommendations for Other Services OT consult     Precautions / Restrictions Precautions Precautions: Fall Required Braces or Orthoses: Knee Immobilizer - Left Knee Immobilizer - Left: On when out of bed or walking Restrictions Weight Bearing Restrictions: No LLE Weight Bearing: Weight bearing as tolerated     Mobility  Bed Mobility Overal bed mobility: Needs Assistance Bed Mobility: Supine to Sit,  Sit to Supine     Supine to sit: HOB elevated, Min assist, +2 for safety/equipment Sit to supine: Mod assist, +2 for physical assistance   General bed mobility comments: Pt performed two supine to sit transfers during this encounter (one after knee immobilizer doned in supine).    Transfers Overall transfer level: Needs assistance Equipment used: Rolling walker (2 wheels) Transfers: Sit to/from Stand, Bed to chair/wheelchair/BSC Sit to Stand: Mod assist, Max assist, From elevated surface, +2 physical assistance Stand pivot transfers: Min assist, +2 physical assistance         General transfer comment: Pt performed two sit <> stands (first without KI and second with KI). Cues provided for hand placements and technique. Pt performed side steps at EOB prior to stand pivot transfer to chair. Pt with unsteadiness and required increased cues for sequencing.    Ambulation/Gait Ambulation/Gait assistance: Min assist, +2 physical assistance Gait Distance (Feet): 2 Feet Assistive device: Rolling walker (2 wheels) Gait Pattern/deviations: Decreased stance time - left, Decreased step length - left, Decreased step length - right, Antalgic Gait velocity: decreased     General Gait Details: Minimal side steps performed at EOB prior to stand pivot transfer to chair.   Stairs             Wheelchair Mobility    Modified Rankin (Stroke Patients Only)       Balance                                            Cognition Arousal/Alertness: Awake/alert Behavior During Therapy: WFL for tasks assessed/performed Overall Cognitive Status: Within Functional  Limits for tasks assessed                                 General Comments: Pt requires increased cues to follow commands safely.        Exercises Total Joint Exercises Ankle Circles/Pumps:  (reviewed) Quad Sets:  (reviewed) Heel Slides:  (reviewed)    General Comments General comments (skin  integrity, edema, etc.): BP at 140/92 when pt reported dizziness while sitting EOB      Pertinent Vitals/Pain Pain Assessment Pain Assessment: 0-10 Pain Score: 7  Pain Location: left knee Pain Descriptors / Indicators: Aching (stiffness) Pain Intervention(s): Limited activity within patient's tolerance, Monitored during session, Premedicated before session    Home Living                          Prior Function            PT Goals (current goals can now be found in the care plan section) Acute Rehab PT Goals Patient Stated Goal: none stated PT Goal Formulation: With patient Time For Goal Achievement: 12/17/21 Potential to Achieve Goals: Fair Progress towards PT goals: Progressing toward goals (slow and limited)    Frequency    7X/week      PT Plan Current plan remains appropriate    Co-evaluation              AM-PAC PT "6 Clicks" Mobility   Outcome Measure  Help needed turning from your back to your side while in a flat bed without using bedrails?: A Lot Help needed moving from lying on your back to sitting on the side of a flat bed without using bedrails?: A Lot Help needed moving to and from a bed to a chair (including a wheelchair)?: Total Help needed standing up from a chair using your arms (e.g., wheelchair or bedside chair)?: Total Help needed to walk in hospital room?: Total Help needed climbing 3-5 steps with a railing? : Total 6 Click Score: 8    End of Session Equipment Utilized During Treatment: Gait belt;Left knee immobilizer Activity Tolerance: Patient limited by pain Patient left: with call bell/phone within reach;in chair;with chair alarm set;with family/visitor present Nurse Communication: Mobility status;Precautions PT Visit Diagnosis: Other abnormalities of gait and mobility (R26.89);Muscle weakness (generalized) (M62.81);Pain Pain - Right/Left: Left Pain - part of body: Knee     Time: 3532-9924 PT Time Calculation (min)  (ACUTE ONLY): 36 min  Charges:  $Therapeutic Activity: 23-37 mins                     Tana Coast, PT    [employer] 12/10/2021, 3:49 PM

## 2021-12-10 NOTE — Evaluation (Signed)
Physical Therapy Evaluation Patient Details Name: Leah Roberts MRN: [id number] DOB: 10-20-1950 Today's Date: 12/10/2021  History of Present Illness  Pt is a 71 y/o female s/p L TKA. Pt has PMH of HTN, HLD, DVT, unstable angina, anemia, and bronchitis.  Clinical Impression  Pt agreeable to physical therapy evaluation/treatment session. Pt with poor tolerance today 2/2 poor balance, weakness, and severe pain (was premedicated). Pt required moderate to maximal assistance to achieve standing position with RW. Pt unsteady and further mobility deferred. Pt currently presents with functional limitations secondary to impairments listed in PT problem list. Pt to benefit from skilled, acute care physical therapy interventions to maximize her independence level and quality of life.      Recommendations for follow up therapy are one component of a multi-disciplinary discharge planning process, led by the attending physician.  Recommendations may be updated based on patient status, additional functional criteria and insurance authorization.  Follow Up Recommendations Other (comment) (pending pt progress)      Assistance Recommended at Discharge Frequent or constant Supervision/Assistance  Patient can return home with the following  A lot of help with walking and/or transfers;A lot of help with bathing/dressing/bathroom;Assistance with cooking/housework;Assist for transportation    Equipment Recommendations Rolling walker (2 wheels);BSC/3in1  Recommendations for Other Services  OT consult    Functional Status Assessment Patient has had a recent decline in their functional status and demonstrates the ability to make significant improvements in function in a reasonable and predictable amount of time.     Precautions / Restrictions Precautions Precautions: Fall Restrictions Weight Bearing Restrictions: No LLE Weight Bearing: Weight bearing as tolerated      Mobility  Bed Mobility Overal  bed mobility: Needs Assistance Bed Mobility: Supine to Sit, Sit to Supine     Supine to sit: Mod assist, HOB elevated Sit to supine: Max assist   General bed mobility comments: Pt with significant L LE pain with movement. Bed rail utilized.    Transfers Overall transfer level: Needs assistance Equipment used: Rolling walker (2 wheels) Transfers: Sit to/from Stand Sit to Stand: Mod assist, Max assist, From elevated surface           General transfer comment: Pt required two attempts to achieve full standing position. Pt with unsteadiness, left knee buckling, and severe pain. Cues provided for hand placements and technique. Further mobility deferred.    Ambulation/Gait               General Gait Details: unable  Stairs            Wheelchair Mobility    Modified Rankin (Stroke Patients Only)       Balance                                             Pertinent Vitals/Pain Pain Assessment Pain Assessment: 0-10 Pain Score: 7  Pain Location: left knee Pain Descriptors / Indicators: Aching (stiffness) Pain Intervention(s): Monitored during session, Premedicated before session, Limited activity within patient's tolerance Ice applied    Home Living Family/patient expects to be discharged to:: Private residence Living Arrangements:  (lives with sister who is disabled; son is coming in from out of town until Sunday) Available Help at Discharge: Available 24 hours/day (until Sunday from son) Type of Home: House Home Access: Level entry       Home Layout: One level Home Equipment: Air traffic controller  seat;Grab bars - tub/shower (hurry cane)      Prior Function Prior Level of Function : Independent/Modified Independent;Driving (pt is retired)             Mobility Comments: Used hurrycane prior to surgery. ADLs Comments: Pt independent with dressing, bathing, and cooking (limited). Friends have been taking care of groceries lately. One of pt's  relatives has been helping with housework lately.     Hand Dominance   Dominant Hand: Right    Extremity/Trunk Assessment   Upper Extremity Assessment Upper Extremity Assessment: Generalized weakness    Lower Extremity Assessment Lower Extremity Assessment:  (2-/5 L LE; grossly 4-/5 R LE)       Communication   Communication: Other (comment) (Pt's secondary language is English and has mild difficulty with communication. Wanda Plump is primary language)  Cognition Arousal/Alertness: Awake/alert Behavior During Therapy: WFL for tasks assessed/performed Overall Cognitive Status: Within Functional Limits for tasks assessed                                 General Comments: Pt A and O x 4. Pt requires increased cues to follow commands safely.        General Comments General comments (skin integrity, edema, etc.): HR and SpO2 stable on RA    Exercises Total Joint Exercises Ankle Circles/Pumps: Both, 15 reps, Supine Quad Sets: Left, Supine, 5 reps Heel Slides: Left, 5 reps, Supine (with gait belt)   Assessment/Plan    PT Assessment Patient needs continued PT services  PT Problem List Decreased strength;Decreased range of motion;Decreased activity tolerance;Decreased balance;Decreased mobility;Decreased knowledge of use of DME;Decreased safety awareness;Decreased knowledge of precautions;Pain       PT Treatment Interventions DME instruction;Gait training;Functional mobility training;Therapeutic activities;Therapeutic exercise;Balance training;Neuromuscular re-education;Patient/family education;Modalities    PT Goals (Current goals can be found in the Care Plan section)  Acute Rehab PT Goals Patient Stated Goal: none stated PT Goal Formulation: With patient Time For Goal Achievement: 12/17/21 Potential to Achieve Goals: Fair    Frequency BID     Co-evaluation               AM-PAC PT "6 Clicks" Mobility  Outcome Measure Help needed turning from your  back to your side while in a flat bed without using bedrails?: A Lot Help needed moving from lying on your back to sitting on the side of a flat bed without using bedrails?: A Lot Help needed moving to and from a bed to a chair (including a wheelchair)?: Total Help needed standing up from a chair using your arms (e.g., wheelchair or bedside chair)?: Total Help needed to walk in hospital room?: Total Help needed climbing 3-5 steps with a railing? : Total 6 Click Score: 8    End of Session Equipment Utilized During Treatment: Gait belt Activity Tolerance: Patient limited by pain Patient left: in bed;with call bell/phone within reach;with bed alarm set;with SCD's reapplied Nurse Communication: Mobility status;Precautions;Patient requests pain meds PT Visit Diagnosis: Other abnormalities of gait and mobility (R26.89);Muscle weakness (generalized) (M62.81);Pain Pain - Right/Left: Left Pain - part of body: Knee    Time: 8299-3716 PT Time Calculation (min) (ACUTE ONLY): 47 min   Charges:   PT Evaluation $PT Eval Low Complexity: 1 Low PT Treatments $Therapeutic Exercise: 8-22 mins $Therapeutic Activity: 8-22 mins        Tana Coast, PT   [employer] 12/10/2021, 9:42 AM

## 2021-12-10 NOTE — Progress Notes (Signed)
Patient ID: Leah Roberts, female   DOB: 1950/06/17, 71 y.o.   MRN: [id number] Patient is postoperative day 1 left total knee arthroplasty.  There is no ice on her knee.  There are pillows under her knee to keep it bent.  Physical therapy did not see her yesterday.  Plan for discharge today.  Patient to be discharged with the [facility] ice machine.

## 2021-12-10 NOTE — Plan of Care (Signed)
  Problem: Education: Goal: Knowledge of General Education information will improve Description: Including pain rating scale, medication(s)/side effects and non-pharmacologic comfort measures Outcome: Progressing   Problem: Health Behavior/Discharge Planning: Goal: Ability to manage health-related needs will improve Outcome: Progressing   Problem: Clinical Measurements: Goal: Will remain free from infection Outcome: Progressing Goal: Respiratory complications will improve Outcome: Progressing   Problem: Pain Managment: Goal: General experience of comfort will improve Outcome: Progressing

## 2021-12-10 NOTE — Progress Notes (Signed)
Orthopedic Tech Progress Note Patient Details:  Leah Roberts August 01, 1950 [id number]  Ortho Devices Type of Ortho Device: Knee Immobilizer Ortho Device/Splint Interventions: Ordered      Zaleah Ternes A Julyan Gales 12/10/2021, 2:10 PM

## 2021-12-10 NOTE — TOC Transition Note (Signed)
Transition of Care [facility]) - CM/SW Discharge Note   Patient Details  Name: Leah Roberts MRN: [id number] Date of Birth: Jul 11, 1950  Transition of [facility]) CM/SW Contact:  Tom-Johnson, Hershal Coria, RN Phone Number: 12/10/2021, 12:17 PM   Clinical Narrative:     Patient is scheduled for discharge today. From home with her sister. Has three adult children that are out of state. Her son came down from [place] to assist. Patient is originally from [place], awaiting her [employer]. Does not have medical insurance, home health referral called in to [facility] as they are agency doing [facility] this week. Morrie Sheldon voiced acceptance and info on AVS. RW ordered from [facility] through [place] as well, Arnold Long to deliver at bedside.  CM spoke with patient's son and he states he secured PCS from [facility] through his benefits from his job.  Son to transport at discharge. No further TOC needs noted.   Final next level of care: Home w Home Health Services Barriers to Discharge: Barriers Resolved   Patient Goals and CMS Choice Patient states their goals for this hospitalization and ongoing recovery are:: To return home CMS Medicare.gov Compare Post Acute Care list provided to:: Patient Choice offered to / list presented to : Patient, Adult Children (Son, [place].)  Discharge Placement                Patient to be transferred to facility by: Son      Discharge Plan and Services                DME Arranged: 3-N-1, Walker rolling (Through [place] DME Agency: [facility] Date DME Agency Contacted: 12/10/21 Time DME Agency Contacted: 1200 Representative spoke with at DME Agency: Arnold Long HH Arranged: PT, OT HH Agency: [facility] (Through [facility]) Date HH Agency Contacted: 12/10/21 Time HH Agency Contacted: 1150 Representative spoke with at [facility] Agency: Morrie Sheldon  Social Determinants of Health (SDOH) Interventions     Readmission Risk Interventions     No  data to display

## 2021-12-10 NOTE — Care Management Obs Status (Deleted)
MEDICARE OBSERVATION STATUS NOTIFICATION   Patient Details  Name: Leah Roberts MRN: [id number] Date of Birth: 09/19/1950   Medicare Observation Status Notification Given:  Yes    Tom-Johnson, Hershal Coria, RN 12/10/2021, 9:09 AM

## 2021-12-11 NOTE — Plan of Care (Signed)
  Problem: Education: Goal: Knowledge of General Education information will improve Description: Including pain rating scale, medication(s)/side effects and non-pharmacologic comfort measures Outcome: Not Progressing   Problem: Health Behavior/Discharge Planning: Goal: Ability to manage health-related needs will improve Outcome: Not Progressing   Problem: Clinical Measurements: Goal: Ability to maintain clinical measurements within normal limits will improve Outcome: Not Progressing Goal: Will remain free from infection Outcome: Not Progressing Goal: Respiratory complications will improve Outcome: Not Progressing Goal: Cardiovascular complication will be avoided Outcome: Not Progressing   Problem: Nutrition: Goal: Adequate nutrition will be maintained Outcome: Not Progressing   Problem: Coping: Goal: Level of anxiety will decrease Outcome: Not Progressing   Problem: Elimination: Goal: Will not experience complications related to bowel motility Outcome: Not Progressing Goal: Will not experience complications related to urinary retention Outcome: Not Progressing

## 2021-12-11 NOTE — Evaluation (Signed)
Patient states iceman too cold. Removed per patient. Patient also states knee immobilizer "pricking" her leg. Removed per patient.

## 2021-12-11 NOTE — Evaluation (Signed)
Patient insists pillows under thigh and calf to affected leg will reduce pain. Education provided for rationale of post-op orders to keep knee straight. Pillows inserted via patient following teaching.

## 2021-12-11 NOTE — Progress Notes (Signed)
Physical Therapy Treatment Patient Details Name: Leah Roberts MRN: [id number] DOB: [birth year] Today's Date: 12/11/2021   History of Present Illness Pt is a 71 y/o female s/p L TKA. Pt has PMH of HTN, HLD, DVT, unstable angina, anemia, and bronchitis.    PT Comments    Patient progressing well for afternoon session. Motivated to progress mobility/gait. Session focused on knee AAROM/AROM and gait progression. Improved ambulation distance to 30' with chair follow needing Min guard-Min A for safety. Knee AROM 8-45 degrees. Emphasized importance of flexing knee during transitions and keeping in KI when in bed. PLans to d/c home with support of family soon. Will follow.    Recommendations for follow up therapy are one component of a multi-disciplinary discharge planning process, led by the attending physician.  Recommendations may be updated based on patient status, additional functional criteria and insurance authorization.  Follow Up Recommendations  Follow physician's recommendations for discharge plan and follow up therapies     Assistance Recommended at Discharge Frequent or constant Supervision/Assistance  Patient can return home with the following A lot of help with walking and/or transfers;A lot of help with bathing/dressing/bathroom;Assistance with cooking/housework;Assist for transportation   Equipment Recommendations  Rolling walker (2 wheels);BSC/3in1    Recommendations for Other Services       Precautions / Restrictions Precautions Precautions: Fall Required Braces or Orthoses: Knee Immobilizer - Left Knee Immobilizer - Left: On when out of bed or walking Restrictions Weight Bearing Restrictions: Yes LLE Weight Bearing: Weight bearing as tolerated     Mobility  Bed Mobility Overal bed mobility: Needs Assistance Bed Mobility: Supine to Sit     Supine to sit: Mod assist, HOB elevated     General bed mobility comments: Up in chair upon PT arrival.     Transfers Overall transfer level: Needs assistance Equipment used: Rolling walker (2 wheels) Transfers: Sit to/from Stand Sit to Stand: Mod assist Stand pivot transfers: Min assist         General transfer comment: Mod A to power to standing from chair x1, with cues for hand placement/technique. Cues to let left knee flex during transitions.    Ambulation/Gait Ambulation/Gait assistance: Min assist Gait Distance (Feet): 30 Feet Assistive device: Rolling walker (2 wheels) Gait Pattern/deviations: Decreased stance time - left, Decreased step length - left, Decreased step length - right, Antalgic, Step-through pattern Gait velocity: decreased Gait velocity interpretation: <1.31 ft/sec, indicative of household ambulator   General Gait Details: Slow, mildly unsteady gait with heavy reliance on UEs for support, dragging LLE at times, fatigues. 1 standing rest break. Cues for knee extension during stance phase.   Stairs             Wheelchair Mobility    Modified Rankin (Stroke Patients Only)       Balance Overall balance assessment: Needs assistance Sitting-balance support: Feet supported, No upper extremity supported Sitting balance-[name]: Fair Sitting balance - Comments: Does better with at least 1 UE support.   Standing balance support: During functional activity, Reliant on assistive device for balance Standing balance-[name]: Poor Standing balance comment: Requires close Min guard for safety and Min A at times during dynamic tasks                            Cognition Arousal/Alertness: Awake/alert Behavior During Therapy: WFL for tasks assessed/performed Overall Cognitive Status: Within Functional Limits for tasks assessed  Exercises Total Joint Exercises Ankle Circles/Pumps: AROM, Both, 10 reps, Supine Quad Sets: AROM, Both, 10 reps, Supine Knee Flexion: AAROM, Left, 5 reps,  Seated Goniometric ROM: ~8-45 degrees knee AROM    General Comments        Pertinent Vitals/Pain Pain Assessment Pain Assessment: Faces Pain Score: 6  Faces Pain Scale: Hurts little more Pain Location: left knee Pain Descriptors / Indicators: Aching, Sore, Operative site guarding Pain Intervention(s): Monitored during session, Premedicated before session, Repositioned    Home Living                          Prior Function            PT Goals (current goals can now be found in the care plan section) Progress towards PT goals: Progressing toward goals    Frequency    7X/week      PT Plan Current plan remains appropriate    Co-evaluation              AM-PAC PT "6 Clicks" Mobility   Outcome Measure  Help needed turning from your back to your side while in a flat bed without using bedrails?: A Lot Help needed moving from lying on your back to sitting on the side of a flat bed without using bedrails?: A Lot Help needed moving to and from a bed to a chair (including a wheelchair)?: A Lot Help needed standing up from a chair using your arms (e.g., wheelchair or bedside chair)?: A Lot Help needed to walk in hospital room?: A Little Help needed climbing 3-5 steps with a railing? : Total 6 Click Score: 12    End of Session Equipment Utilized During Treatment: Gait belt Activity Tolerance: Patient tolerated treatment well Patient left: in chair;with call bell/phone within reach Nurse Communication: Mobility status;Other (comment) (helpe getting dressed) PT Visit Diagnosis: Other abnormalities of gait and mobility (R26.89);Muscle weakness (generalized) (M62.81);Pain Pain - Right/Left: Left Pain - part of body: Knee     Time: 2130-8657 PT Time Calculation (min) (ACUTE ONLY): 19 min  Charges:  $Gait Training: 8-22 mins                     Vale Haven, PT, DPT Acute Rehabilitation Services Secure chat preferred Office [phone]      Blake Divine A  Lanier Ensign 12/11/2021, 12:59 PM

## 2021-12-11 NOTE — Progress Notes (Addendum)
Patient ID: Leah Roberts, female   DOB: 14-Aug-1950, 71 y.o.   MRN: [id number] Patient is postoperative day 2  left total knee arthroplasty. In immobilizer. Now has ice man ice machine has not been tolerating using ice machine. States pain and mobility some better than yesterday. Case Manager working on setting patient up with [facility] PT and in home assistance. Plan for discharge home today after PT.  Barnie Del, FNP [phone]

## 2021-12-11 NOTE — TOC Transition Note (Signed)
Transition of Care [facility]) - CM/SW Discharge Note   Patient Details  Name: Hanalei Glace MRN: [id number] Date of Birth: July 16, 1950  [facility] of [facility] CM/SW Contact:  Bess Kinds, RN Phone Number: [phone] 12/11/2021, 11:13 AM   Clinical Narrative:     Patient to transition home today. Charity care arranged for home health PT through [facility]. HH orders requested. No further TOC needs identified at this time.   Final next level of care: Home w Home Health Services Barriers to Discharge: Barriers Resolved   Patient Goals and CMS Choice Patient states their goals for this hospitalization and ongoing recovery are:: To return home CMS Medicare.gov Compare Post Acute Care list provided to:: Patient Choice offered to / list presented to : Patient, Adult Children (Son, [place].)  Discharge Placement                Patient to be transferred to facility by: Son      Discharge Plan and Services                DME Arranged: 3-N-1, Walker rolling (Through [place] DME Agency: [employer] Date DME Agency Contacted: 12/10/21 Time DME Agency Contacted: 1200 Representative spoke with at DME Agency: Arnold Long HH Arranged: PT, OT HH Agency: [facility]) Date HH Agency Contacted: 12/10/21 Time HH Agency Contacted: 1150 Representative spoke with at [facility] Agency: Morrie Sheldon  Social Determinants of Health (SDOH) Interventions     Readmission Risk Interventions     No data to display

## 2021-12-11 NOTE — Progress Notes (Addendum)
Physical Therapy Treatment Patient Details Name: Leah Roberts MRN: [id number] DOB: [birth year] Today's Date: 12/11/2021   History of Present Illness Pt is a 71 y/o female s/p L TKA. Pt has PMH of HTN, HLD, DVT, unstable angina, anemia, and bronchitis.    PT Comments    Patient progressing well towards PT goals. Session focused on progressive ambulation and functional transfers. Continues to require Mod A for bed mobility, heavy Mod A to stand from [employer] and Min A for gait training. Able to improve distance today with Min A and use of RW for support. Limited by pain and fatigue. Reviewed HEP. Discussed car transfer, positioning of LLE, problem solved how to get to bedroom from front door etc. Encouraged family using gait belt and rollator seat as needed pending fatigue level to safely get to bedroom. Will follow.    Recommendations for follow up therapy are one component of a multi-disciplinary discharge planning process, led by the attending physician.  Recommendations may be updated based on patient status, additional functional criteria and insurance authorization.  Follow Up Recommendations  Follow physician's recommendations for discharge plan and follow up therapies     Assistance Recommended at Discharge Frequent or constant Supervision/Assistance  Patient can return home with the following A lot of help with walking and/or transfers;A lot of help with bathing/dressing/bathroom;Assistance with cooking/housework;Assist for transportation   Equipment Recommendations  Rolling walker (2 wheels);BSC/3in1    Recommendations for Other Services       Precautions / Restrictions Precautions Precautions: Fall Required Braces or Orthoses: Knee Immobilizer - Left Knee Immobilizer - Left: On when out of bed or walking Restrictions Weight Bearing Restrictions: Yes LLE Weight Bearing: Weight bearing as tolerated     Mobility  Bed Mobility Overal bed mobility: Needs  Assistance Bed Mobility: Supine to Sit     Supine to sit: Mod assist, HOB elevated     General bed mobility comments: Assist with LLE and trunk to get to EOB< increased time. KI donned.    Transfers Overall transfer level: Needs assistance Equipment used: Rolling walker (2 wheels) Transfers: Sit to/from Stand, Bed to chair/wheelchair/BSC Sit to Stand: Mod assist, From elevated surface Stand pivot transfers: Min assist         General transfer comment: Stood from EOB x1 with heavy Mod A with cues for hand placement/technique, stood from [facility] x1, transferred to chair post ambulation.    Ambulation/Gait Ambulation/Gait assistance: Min assist Gait Distance (Feet): 10 Feet Assistive device: Rolling walker (2 wheels) Gait Pattern/deviations: Decreased stance time - left, Decreased step length - left, Decreased step length - right, Antalgic, Step-through pattern Gait velocity: decreased Gait velocity interpretation: <1.31 ft/sec, indicative of household ambulator   General Gait Details: Slow, mildly unsteady gait with heavy reliance on UEs for support, fatigues. Leaning on RW with forearms with cues to stay upright.  Walked with KI Surveyor, quantity Rankin (Stroke Patients Only)       Balance Overall balance assessment: Needs assistance Sitting-balance support: Feet supported, Single extremity supported Sitting balance-[name]: Fair Sitting balance - Comments: Does better with at least 1 UE support.   Standing balance support: During functional activity, Reliant on assistive device for balance Standing balance-[name]: Poor Standing balance comment: Requires close Min guard for safety and Min A at times during dynamic tasks  Cognition Arousal/Alertness: Awake/alert Behavior During Therapy: WFL for tasks assessed/performed Overall Cognitive Status: Within Functional Limits for  tasks assessed                                          Exercises Total Joint Exercises Ankle Circles/Pumps: AROM, Both, 10 reps, Supine Quad Sets: AROM, Both, 10 reps, Supine    General Comments        Pertinent Vitals/Pain Pain Assessment Pain Assessment: 0-10 Pain Score: 6  Pain Location: left knee Pain Descriptors / Indicators: Aching, Sore, Operative site guarding Pain Intervention(s): Premedicated before session, Monitored during session, Repositioned, Limited activity within patient's tolerance    Home Living                          Prior Function            PT Goals (current goals can now be found in the care plan section) Progress towards PT goals: Progressing toward goals    Frequency    7X/week      PT Plan Current plan remains appropriate    Co-evaluation              AM-PAC PT "6 Clicks" Mobility   Outcome Measure  Help needed turning from your back to your side while in a flat bed without using bedrails?: A Lot Help needed moving from lying on your back to sitting on the side of a flat bed without using bedrails?: A Lot Help needed moving to and from a bed to a chair (including a wheelchair)?: A Lot Help needed standing up from a chair using your arms (e.g., wheelchair or bedside chair)?: A Lot Help needed to walk in hospital room?: A Little Help needed climbing 3-5 steps with a railing? : Total 6 Click Score: 12    End of Session Equipment Utilized During Treatment: Gait belt;Left knee immobilizer Activity Tolerance: Patient tolerated treatment well;Patient limited by fatigue Patient left: in chair;with call bell/phone within reach Nurse Communication: Mobility status;Patient requests pain meds PT Visit Diagnosis: Other abnormalities of gait and mobility (R26.89);Muscle weakness (generalized) (M62.81);Pain Pain - Right/Left: Left Pain - part of body: Knee     Time: 6314-9702 PT Time Calculation (min)  (ACUTE ONLY): 26 min  Charges:  $Gait Training: 8-22 mins $Therapeutic Activity: 8-22 mins                     Vale Haven, PT, DPT Acute Rehabilitation Services Secure chat preferred Office [phone]      Blake Divine A Priyah Schmuck 12/11/2021, 12:54 PM

## 2021-12-11 NOTE — Plan of Care (Signed)
  Problem: Education: Goal: Knowledge of General Education information will improve Description: Including pain rating scale, medication(s)/side effects and non-pharmacologic comfort measures Outcome: Progressing   Problem: Clinical Measurements: Goal: Will remain free from infection Outcome: Progressing   Problem: Nutrition: Goal: Adequate nutrition will be maintained Outcome: Progressing   Problem: Skin Integrity: Goal: Risk for impaired skin integrity will decrease Outcome: Progressing   

## 2021-12-14 ENCOUNTER — Other Ambulatory Visit: Payer: Self-pay

## 2021-12-14 ENCOUNTER — Telehealth: Payer: Self-pay | Admitting: Orthopedic Surgery

## 2021-12-14 NOTE — Telephone Encounter (Signed)
LM on Id'd VM of approval of VO

## 2021-12-14 NOTE — Telephone Encounter (Signed)
Gerri Spore called. He is the PT working with patient in home. Would like verbal orders for PT. 3x wk for 2 wks and 2x for 1wk. [phone] is his cb number.

## 2021-12-17 ENCOUNTER — Telehealth: Payer: Self-pay | Admitting: Orthopedic Surgery

## 2021-12-17 ENCOUNTER — Other Ambulatory Visit: Payer: Self-pay | Admitting: Orthopedic Surgery

## 2021-12-17 ENCOUNTER — Other Ambulatory Visit: Payer: Self-pay

## 2021-12-17 MED ORDER — OXYCODONE-ACETAMINOPHEN 5-325 MG PO TABS
1.0000 | ORAL_TABLET | ORAL | 0 refills | Status: DC | PRN
  Filled 2021-12-17: qty 30, 5d supply, fill #0

## 2021-12-17 NOTE — Telephone Encounter (Signed)
Pt called requesting a refill of pain medication and muscle relaxer. Pt is also asking for a call back concerning her wrap on wound. Please send meds to pharmacy on file. Pt phone number is [phone].

## 2021-12-17 NOTE — Telephone Encounter (Signed)
This pt is s/p a total knee and requesting refill on pain medication last refill was 12/10/21 #30 percocet 5/325 please advise.

## 2021-12-18 NOTE — Telephone Encounter (Signed)
Leah Roberts from home health care called in about patient condition stating she is having trouble managing her pain and poor tolerance to exercises and he rang on knee is about 50 degrees

## 2021-12-18 NOTE — Telephone Encounter (Signed)
I called pt to advise of message below. Will call with any questions.

## 2021-12-22 NOTE — Telephone Encounter (Signed)
Pt is coming in for an appt this week and we can evaluate her ROM at that time.

## 2021-12-23 ENCOUNTER — Ambulatory Visit (INDEPENDENT_AMBULATORY_CARE_PROVIDER_SITE_OTHER): Payer: Self-pay

## 2021-12-23 ENCOUNTER — Encounter: Payer: Self-pay | Admitting: Family

## 2021-12-23 ENCOUNTER — Ambulatory Visit (INDEPENDENT_AMBULATORY_CARE_PROVIDER_SITE_OTHER): Payer: Self-pay | Admitting: Family

## 2021-12-23 DIAGNOSIS — Z96652 Presence of left artificial knee joint: Secondary | ICD-10-CM

## 2021-12-23 NOTE — Progress Notes (Signed)
Post-Op Visit Note   Patient: Leah Roberts           Date of Birth: 18-Apr-1951           MRN: [id number] Visit Date: 12/23/2021 PCP: Allegra Grana, FNP  Chief Complaint:  Chief Complaint  Patient presents with   Left Knee - Routine Post Op    12/09/21 left TKA    HPI:  HPI The patient is a 71  year old woman seen status post left total knee arthroplasty on 12/09/21. Has had HH PT. States is 3 times per week.  Ortho Exam On examination of left TKA incision is well healed. Staples in place. No erythema, edema or sign of infection lacks 10 degrees full extension, flexion to 100.  Visit Diagnoses:  1. Status post total left knee replacement     Plan: Staples harvested today without incident.  Continue daily Dial soap cleansing dry dressings discussed the importance of physical therapy.   Follow-Up Instructions: Return in about 4 weeks (around 01/20/2022).   Imaging: No results found.  Orders:  Orders Placed This Encounter  Procedures   XR Knee 1-2 Views Left   No orders of the defined types were placed in this encounter.    PMFS History: Patient Active Problem List   Diagnosis Date Noted   Total knee replacement status, left 12/09/2021   Unilateral primary osteoarthritis, left knee    Shoulder disorder 09/03/2021   Arthritis of knee 09/03/2021   Lumbar radiculopathy 09/03/2021   Abnormal MRI, knee 09/03/2021   Chronic pain of right knee 09/03/2021   Bronchitis 03/25/2021   Anemia 01/09/2020   HTN (hypertension) 12/31/2019   Chest pain 12/31/2019   DOE (dyspnea on exertion) 12/31/2019   Acute coronary syndrome (HCC) 12/31/2019   Unstable angina (HCC) 12/31/2019   Atherosclerosis 08/23/2018   Screening for tuberculosis 07/12/2018   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   Arthralgia 07/12/2018   Solitary pulmonary nodule 07/12/2018   Neck pain 02/15/2018   Dizziness 02/15/2018   Elevated TSH 02/15/2018   Screening for HIV (human immunodeficiency virus)  10/05/2017   Neuropathy 10/05/2017   Screening for colon cancer 10/05/2017   Hyperlipidemia 08/04/2016   Prediabetes 08/04/2016   Back pain 08/04/2016   Bilateral knee pain 06/18/2016   History of DVT of lower extremity 04/01/2014   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   GERD (gastroesophageal reflux disease)    History of blood transfusion    History of hypertension    Hypertension    Peripheral vascular disease (HCC)    DVT   Pre-diabetes    Pulmonary embolism (HCC) 2013    Family History  Problem Relation Age of Onset   Hypertension Father    Heart disease Father 108   Thyroid disease Sister    Allergies Sister    CVA Neg Hx    Heart attack Neg Hx    High Cholesterol Neg Hx    Colon cancer Neg Hx     Past Surgical History:  Procedure Laterality Date   APPENDECTOMY     TONSILLECTOMY AND ADENOIDECTOMY     TOTAL KNEE ARTHROPLASTY Left 12/09/2021   Procedure: LEFT TOTAL KNEE ARTHROPLASTY;  Surgeon: Aldean Baker  V, MD;  Location: [facility] OR;  Service: Orthopedics;  Laterality: Left;   Social History   Occupational History   Not on file  Tobacco Use   Smoking status: Never   Smokeless tobacco: Never  Substance and Sexual Activity   Alcohol use: Yes    Comment: Ocassional glass of wine   Drug use: No   Sexual activity: Not on file

## 2021-12-28 ENCOUNTER — Telehealth: Payer: Self-pay | Admitting: Orthopedic Surgery

## 2021-12-28 ENCOUNTER — Other Ambulatory Visit: Payer: Self-pay

## 2021-12-28 ENCOUNTER — Other Ambulatory Visit: Payer: Self-pay | Admitting: Orthopedic Surgery

## 2021-12-28 MED ORDER — METHOCARBAMOL 500 MG PO TABS
500.0000 mg | ORAL_TABLET | Freq: Three times a day (TID) | ORAL | 0 refills | Status: DC | PRN
  Filled 2021-12-28: qty 20, 7d supply, fill #0

## 2021-12-28 MED ORDER — OXYCODONE-ACETAMINOPHEN 5-325 MG PO TABS
1.0000 | ORAL_TABLET | ORAL | 0 refills | Status: DC | PRN
  Filled 2021-12-28: qty 30, 5d supply, fill #0

## 2021-12-28 NOTE — Telephone Encounter (Signed)
Patient called she would like a refill on oxycodone and muscle relaxer. Her call back number is [phone]

## 2021-12-28 NOTE — Telephone Encounter (Signed)
I called pt and advised that rx sent to pharm.

## 2021-12-28 NOTE — Telephone Encounter (Signed)
Pt is s/p a left total knee 12/09/2021 requesting refill of Oxycodone 5/325 last fill was 12/07/21 #30 I do not see that we gave rx for muscle relaxer which the pt is also requesting.

## 2021-12-30 ENCOUNTER — Inpatient Hospital Stay
Admission: EM | Admit: 2021-12-30 | Discharge: 2022-01-04 | DRG: 163 | Disposition: A | Discharge status: Home / Self Care | Payer: No Typology Code available for payment source | Attending: Internal Medicine | Admitting: Internal Medicine

## 2021-12-30 ENCOUNTER — Emergency Department: Payer: No Typology Code available for payment source

## 2021-12-30 ENCOUNTER — Telehealth: Payer: Self-pay | Admitting: Orthopedic Surgery

## 2021-12-30 ENCOUNTER — Other Ambulatory Visit: Payer: Self-pay

## 2021-12-30 DIAGNOSIS — R0602 Shortness of breath: Secondary | ICD-10-CM

## 2021-12-30 DIAGNOSIS — Y838 Other surgical procedures as the cause of abnormal reaction of the patient, or of later complication, without mention of misadventure at the time of the procedure: Secondary | ICD-10-CM | POA: Diagnosis not present

## 2021-12-30 DIAGNOSIS — Z96652 Presence of left artificial knee joint: Secondary | ICD-10-CM | POA: Diagnosis present

## 2021-12-30 DIAGNOSIS — I2609 Other pulmonary embolism with acute cor pulmonale: Principal | ICD-10-CM | POA: Diagnosis present

## 2021-12-30 DIAGNOSIS — I739 Peripheral vascular disease, unspecified: Secondary | ICD-10-CM | POA: Diagnosis present

## 2021-12-30 DIAGNOSIS — I2699 Other pulmonary embolism without acute cor pulmonale: Principal | ICD-10-CM

## 2021-12-30 DIAGNOSIS — Z20822 Contact with and (suspected) exposure to covid-19: Secondary | ICD-10-CM | POA: Diagnosis present

## 2021-12-30 DIAGNOSIS — I97618 Postprocedural hemorrhage and hematoma of a circulatory system organ or structure following other circulatory system procedure: Secondary | ICD-10-CM | POA: Diagnosis not present

## 2021-12-30 DIAGNOSIS — K219 Gastro-esophageal reflux disease without esophagitis: Secondary | ICD-10-CM | POA: Diagnosis present

## 2021-12-30 DIAGNOSIS — Z8349 Family history of other endocrine, nutritional and metabolic diseases: Secondary | ICD-10-CM

## 2021-12-30 DIAGNOSIS — Z79899 Other long term (current) drug therapy: Secondary | ICD-10-CM

## 2021-12-30 DIAGNOSIS — Z882 Allergy status to sulfonamides status: Secondary | ICD-10-CM

## 2021-12-30 DIAGNOSIS — Z7901 Long term (current) use of anticoagulants: Secondary | ICD-10-CM

## 2021-12-30 DIAGNOSIS — R778 Other specified abnormalities of plasma proteins: Secondary | ICD-10-CM | POA: Diagnosis present

## 2021-12-30 DIAGNOSIS — I10 Essential (primary) hypertension: Secondary | ICD-10-CM | POA: Diagnosis present

## 2021-12-30 DIAGNOSIS — R7303 Prediabetes: Secondary | ICD-10-CM | POA: Diagnosis present

## 2021-12-30 DIAGNOSIS — Z8249 Family history of ischemic heart disease and other diseases of the circulatory system: Secondary | ICD-10-CM

## 2021-12-30 DIAGNOSIS — Z86718 Personal history of other venous thrombosis and embolism: Secondary | ICD-10-CM

## 2021-12-30 DIAGNOSIS — J9601 Acute respiratory failure with hypoxia: Secondary | ICD-10-CM | POA: Diagnosis present

## 2021-12-30 DIAGNOSIS — M1990 Unspecified osteoarthritis, unspecified site: Secondary | ICD-10-CM | POA: Diagnosis present

## 2021-12-30 LAB — BRAIN NATRIURETIC PEPTIDE: B Natriuretic Peptide: 146.2 pg/mL — ABNORMAL HIGH (ref 0.0–100.0)

## 2021-12-30 LAB — COMPREHENSIVE METABOLIC PANEL
ALT: 10 U/L (ref 0–44)
AST: 21 U/L (ref 15–41)
Albumin: 3.7 g/dL (ref 3.5–5.0)
Alkaline Phosphatase: 67 U/L (ref 38–126)
Anion gap: 10 (ref 5–15)
BUN: 13 mg/dL (ref 8–23)
CO2: 19 mmol/L — ABNORMAL LOW (ref 22–32)
Calcium: 9.4 mg/dL (ref 8.9–10.3)
Chloride: 110 mmol/L (ref 98–111)
Creatinine, Ser: 0.87 mg/dL (ref 0.44–1.00)
GFR, Estimated: >60 mL/min (ref >60)
Glucose, Bld: 121 mg/dL — ABNORMAL HIGH (ref 70–99)
Potassium: 4 mmol/L (ref 3.5–5.1)
Sodium: 139 mmol/L (ref 135–145)
Total Bilirubin: 0.7 mg/dL (ref 0.3–1.2)
Total Protein: 7.5 g/dL (ref 6.5–8.1)

## 2021-12-30 LAB — CBC
HCT: 35.8 % — ABNORMAL LOW (ref 36.0–46.0)
Hemoglobin: 11.2 g/dL — ABNORMAL LOW (ref 12.0–15.0)
MCH: 26.2 pg (ref 26.0–34.0)
MCHC: 31.3 g/dL (ref 30.0–36.0)
MCV: 83.8 fL (ref 80.0–100.0)
Platelets: 275 10*3/uL (ref 150–400)
RBC: 4.27 MIL/uL (ref 3.87–5.11)
RDW: 15.7 % — ABNORMAL HIGH (ref 11.5–15.5)
WBC: 8 10*3/uL (ref 4.0–10.5)
nRBC: 0 % (ref 0.0–0.2)

## 2021-12-30 LAB — TROPONIN I (HIGH SENSITIVITY)
Troponin I (High Sensitivity): 271 ng/L (ref <18)
Troponin I (High Sensitivity): 275 ng/L (ref <18)

## 2021-12-30 LAB — D-DIMER, QUANTITATIVE: D-Dimer, Quant: 10.4 ug/mL-FEU — ABNORMAL HIGH (ref 0.00–0.50)

## 2021-12-30 MED ORDER — IOHEXOL 350 MG/ML SOLN
75.0000 mL | Freq: Once | INTRAVENOUS | Status: AC | PRN
  Administered 2021-12-30: 75 mL via INTRAVENOUS

## 2021-12-30 MED ORDER — ONDANSETRON HCL 4 MG/2ML IJ SOLN
4.0000 mg | Freq: Once | INTRAMUSCULAR | Status: AC
  Administered 2021-12-30: 4 mg via INTRAVENOUS
  Filled 2021-12-30: qty 2

## 2021-12-30 MED ORDER — ASPIRIN 81 MG PO CHEW
324.0000 mg | CHEWABLE_TABLET | Freq: Once | ORAL | Status: AC
  Administered 2021-12-30: 324 mg via ORAL
  Filled 2021-12-30: qty 4

## 2021-12-30 MED ORDER — SODIUM CHLORIDE 0.9 % IV SOLN: INTRAVENOUS | Status: DC

## 2021-12-30 MED ORDER — IOHEXOL 300 MG/ML  SOLN: 75.0000 mL | Freq: Once | INTRAMUSCULAR | Status: DC | PRN

## 2021-12-30 MED ORDER — MORPHINE SULFATE (PF) 4 MG/ML IV SOLN
4.0000 mg | Freq: Once | INTRAVENOUS | Status: AC
  Administered 2021-12-30: 4 mg via INTRAVENOUS
  Filled 2021-12-30: qty 1

## 2021-12-30 NOTE — Telephone Encounter (Signed)
Lm on [name]'s confirmed VM of verbal okay on PT

## 2021-12-30 NOTE — ED Notes (Signed)
Pt brought to CT,

## 2021-12-30 NOTE — ED Provider Notes (Signed)
[facility] Provider Note    Event Date/Time   First MD Initiated Contact with Patient 12/30/21 2258     (approximate)   History   Shortness of Breath   HPI  Leah Roberts is a 71 y.o. female with history of previous PE and DVT after a long flight, recent left total knee by Dr. Lajoyce Corners on 12/09/2021 who has been on Eliquis who presents to the emergency department with shortness of breath.  Had some chest tightness with exertion with physical therapy today.  No fever.  No cough.   History provided by patient.    Past Medical History:  Diagnosis Date   Arthritis    DVT (deep venous thrombosis) (HCC) 2013   Dyspnea    due to knee pain   Family history of adverse reaction to anesthesia    Brother and sister hard to wake up.   GERD (gastroesophageal reflux disease)    History of blood transfusion    History of hypertension    Hypertension    Peripheral vascular disease (HCC)    DVT   Pre-diabetes    Pulmonary embolism (HCC) 2013    Past Surgical History:  Procedure Laterality Date   APPENDECTOMY     TONSILLECTOMY AND ADENOIDECTOMY     TOTAL KNEE ARTHROPLASTY Left 12/09/2021   Procedure: LEFT TOTAL KNEE ARTHROPLASTY;  Surgeon: Nadara Mustard, MD;  Location: [facility] OR;  Service: Orthopedics;  Laterality: Left;    MEDICATIONS:  Prior to Admission medications   Medication Sig Start Date End Date Taking? Authorizing Provider  apixaban (ELIQUIS) 5 MG TABS tablet Take 1 tablet (5 mg total) by mouth 2 (two) times daily. 03/17/21   Allegra Grana, FNP  calcium carbonate (TUMS - DOSED IN MG ELEMENTAL CALCIUM) 500 MG chewable tablet Chew 1 tablet by mouth daily as needed for indigestion or heartburn.    [provider]  lidocaine (LIDODERM) 5 % Place 1 patch onto the skin every 12 (twelve) hours. Remove & Discard patch within 12 hours or as directed by MD 10/28/21 10/28/22  Allegra Grana, FNP  loratadine (CLARITIN) 10 MG tablet Take 10 mg by  mouth daily.    [provider]  Menthol, Topical Analgesic, (BIOFREEZE EX) Apply 1 Application topically daily as needed (leg pain).    [provider]  methocarbamol (ROBAXIN) 500 MG tablet Take 1 tablet (500 mg total) by mouth every 8 (eight) hours as needed for muscle spasms. 12/28/21   Nadara Mustard, MD  Multiple Vitamin (MULTIVITAMIN WITH MINERALS) TABS tablet Take 1 tablet by mouth 2 (two) times a week.    [provider]  oxyCODONE-acetaminophen (PERCOCET/ROXICET) 5-325 MG tablet Take 1 tablet by mouth every 4 (four) hours as needed. 12/28/21   Nadara Mustard, MD    Physical Exam   Triage Vital Signs: ED Triage Vitals  Enc Vitals Group     BP 12/30/21 1821 (!) 131/90     Pulse Rate 12/30/21 1821 97     Resp 12/30/21 1821 19     Temp 12/30/21 1821 98.4 F (36.9 C)     Temp Source 12/30/21 1821 Oral     SpO2 12/30/21 1821 97 %     Weight 12/30/21 1822 199 lb 15.3 oz (90.7 kg)     Height 12/30/21 1822 5\' 6"  (1.676 m)     Head Circumference --      Peak Flow --      Pain Score 12/30/21 1822  9     Pain Loc --      Pain Edu? --      Excl. in [place]? --     Most recent vital signs: Vitals:   12/31/21 0008 12/31/21 0030  BP:  (!) 136/97  Pulse: 83 95  Resp: 16 20  Temp:    SpO2:  100%    CONSTITUTIONAL: Alert and oriented and responds appropriately to questions. Well-appearing; well-nourished HEAD: Normocephalic, atraumatic EYES: Conjunctivae clear, pupils appear equal, sclera nonicteric ENT: normal nose; moist mucous membranes NECK: Supple, normal ROM CARD: RRR; S1 and S2 appreciated; no murmurs, no clicks, no rubs, no gallops RESP: Normal chest excursion without splinting or tachypnea; breath sounds clear and equal bilaterally; no wheezes, no rhonchi, no rales, no hypoxia or respiratory distress, speaking full sentences ABD/GI: Normal bowel sounds; non-distended; soft, non-tender, no rebound, no guarding, no peritoneal signs BACK: The back  appears normal EXT: Patient has swelling noted throughout the left leg without pitting edema.  She has a joint effusion of the left knee but no redness, warmth or significant tenderness.  Incision site is completely healed, clean, dry.  No noted calf tenderness bilaterally.  Compartments of the legs are soft. SKIN: Normal color for age and race; warm; no rash on exposed skin NEURO: Moves all extremities equally, normal speech PSYCH: The patient's mood and manner are appropriate.   ED Results / Procedures / Treatments   LABS: (all labs ordered are listed, but only abnormal results are displayed) Labs Reviewed  COMPREHENSIVE METABOLIC PANEL - Abnormal; Notable for the following components:      Result Value   CO2 19 (*)    Glucose, Bld 121 (*)    All other components within normal limits  CBC - Abnormal; Notable for the following components:   Hemoglobin 11.2 (*)    HCT 35.8 (*)    RDW 15.7 (*)    All other components within normal limits  D-DIMER, QUANTITATIVE - Abnormal; Notable for the following components:   D-Dimer, Quant 10.40 (*)    All other components within normal limits  BRAIN NATRIURETIC PEPTIDE - Abnormal; Notable for the following components:   B Natriuretic Peptide 146.2 (*)    All other components within normal limits  HEPARIN LEVEL (UNFRACTIONATED) - Abnormal; Notable for the following components:   Heparin Unfractionated >1.10 (*)    All other components within normal limits  PROTIME-INR - Abnormal; Notable for the following components:   Prothrombin Time 18.2 (*)    INR 1.5 (*)    All other components within normal limits  TROPONIN I (HIGH SENSITIVITY) - Abnormal; Notable for the following components:   Troponin I (High Sensitivity) 271 (*)    All other components within normal limits  TROPONIN I (HIGH SENSITIVITY) - Abnormal; Notable for the following components:   Troponin I (High Sensitivity) 275 (*)    All other components within normal limits  TROPONIN I  (HIGH SENSITIVITY) - Abnormal; Notable for the following components:   Troponin I (High Sensitivity) 267 (*)    All other components within normal limits  RESP PANEL BY RT-PCR (FLU A&B, COVID) ARPGX2  APTT  HEPARIN LEVEL (UNFRACTIONATED)  APTT  CBC     EKG:  EKG Interpretation  Date/Time:  Wednesday December 30 2021 18:26:49 EDT Ventricular Rate:  96 PR Interval:  166 QRS Duration: 76 QT Interval:  370 QTC Calculation: 467 R Axis:   -2 Text Interpretation: Normal sinus rhythm T wave abnormality, consider anterolateral ischemia  Abnormal ECG When compared with ECG of 22-Oct-2021 14:58, QRS axis Shifted right T wave inversion now evident in Inferior leads T wave inversion now evident in Anterolateral leads Confirmed by Pryor Curia [phone]) on 12/30/2021 11:13:00 PM         RADIOLOGY: My personal review and interpretation of imaging: CT PE study shows bilateral pulmonary emboli with signs of right heart strain.  Chest x-ray shows no edema or infiltrate.  I have personally reviewed all radiology reports.   CT Angio Chest PE W and/or Wo Contrast  Result Date: 12/31/2021 CLINICAL DATA:  Concern for pulmonary embolism. EXAM: CT ANGIOGRAPHY CHEST WITH CONTRAST TECHNIQUE: Multidetector CT imaging of the chest was performed using the standard protocol during bolus administration of intravenous contrast. Multiplanar CT image reconstructions and MIPs were obtained to evaluate the vascular anatomy. RADIATION DOSE REDUCTION: This exam was performed according to the departmental dose-optimization program which includes automated exposure control, adjustment of the mA and/or kV according to patient size and/or use of iterative reconstruction technique. CONTRAST:  66mL OMNIPAQUE IOHEXOL 350 MG/ML SOLN COMPARISON:  Chest radiograph dated 12/30/2021. CT dated 01/01/2020. FINDINGS: Cardiovascular: Top-normal cardiac size. No pericardial effusion. There is retrograde flow of contrast from the right atrium  into the IVC suggestive of right heart dysfunction. There is enlargement of the right heart chambers with RV/LV ratio of 1.5 consistent with right heart straining. Mild atherosclerotic calcification of the aortic arch. No aneurysmal dilatation or dissection. The origins of the great vessels of the aortic arch appear patent. There is dilatation of the pulmonary arteries suggestive of pulmonary hypertension. Moderate-sized bilateral pulmonary artery emboli involving the lobar pulmonary artery in the lingula and right middle lobe as well as additional segmental emboli bilaterally. Mediastinum/Nodes: No hilar or mediastinal adenopathy. The esophagus and the thyroid gland are grossly unremarkable. No mediastinal fluid collection. Lungs/Pleura: No focal consolidation, pleural effusion, or pneumothorax. The central airways are patent. Upper Abdomen: No acute abnormality. Musculoskeletal: Mild degenerative changes. No acute osseous pathology. Review of the MIP images confirms the above findings. IMPRESSION: 1. Positive for acute PE with CT evidence of right heart strain (RV/LV Ratio = 1.5) consistent with at least submassive (intermediate risk) PE. The presence of right heart strain has been associated with an increased risk of morbidity and mortality. Please refer to the "Code PE Focused" order set in EPIC. 2. Aortic Atherosclerosis (ICD10-I70.0). Electronically Signed   By: Anner Crete M.D.   On: 12/31/2021 00:13   DG Chest Port 1 View  Result Date: 12/30/2021 CLINICAL DATA:  Shortness of breath EXAM: PORTABLE CHEST 1 VIEW COMPARISON:  12/31/2019 FINDINGS: Cardiomegaly with mild central congestion. No acute airspace disease, pleural effusion or pneumothorax. Central pulmonary vessels appear slightly enlarged. IMPRESSION: 1. Cardiomegaly with mild central congestion 2. Prominent central pulmonary vessels, question due to pulmonary arterial hypertension. Electronically Signed   By: Donavan Foil M.D.   On:  12/30/2021 19:09     PROCEDURES:  Critical Care performed: Yes, see critical care procedure note(s)   CRITICAL CARE Performed by: Cyril Mourning Hersey Maclellan   Total critical care time: 45 minutes  Critical care time was exclusive of separately billable procedures and treating other patients.  Critical care was necessary to treat or prevent imminent or life-threatening deterioration.  Critical care was time spent personally by me on the following activities: development of treatment plan with patient and/or surrogate as well as nursing, discussions with consultants, evaluation of patient's response to treatment, examination of patient, obtaining history from patient or  surrogate, ordering and performing treatments and interventions, ordering and review of laboratory studies, ordering and review of radiographic studies, pulse oximetry and re-evaluation of patient's condition.   Marland Kitchen1-3 Lead EKG Interpretation  Performed by: Jisell Majer, Layla Maw, DO Authorized by: Tatem Holsonback, Layla Maw, DO     Interpretation: normal     ECG rate:  97   ECG rate assessment: normal     Rhythm: sinus rhythm     Ectopy: none     Conduction: normal       IMPRESSION / MDM / ASSESSMENT AND PLAN / ED COURSE  I reviewed the triage vital signs and the nursing notes.    Patient here with complaints of chest tightness with exertion, shortness of breath.  History of PE and DVT.  Recent left total knee arthroplasty on 12/09/2021.  The patient is on the cardiac monitor to evaluate for evidence of arrhythmia and/or significant heart rate changes.   DIFFERENTIAL DIAGNOSIS (includes but not limited to):   PE, ACS, CHF, COVID, pneumonia, less likely pneumothorax   Patient's presentation is most consistent with acute presentation with potential threat to life or bodily function.   PLAN: CBC, BMP, D-dimer obtained from triage.  EKG shows new anterior lateral and inferior T wave inversions that are new.  Troponin added onto previous  labs and second troponin pending.  Chest x-ray obtained from triage reviewed and interpreted by myself and the radiologist and shows no acute abnormality.  No leukocytosis.  Normal electrolytes.  D-dimer elevated at 10.  We will proceed with CTA of the chest.  She is complaining of increasing pain in her left knee since being in the waiting room for several hours unfortunately.  Will provide with pain medication and apply ice to her leg.  We will also obtain venous Dopplers of bilateral lower extremities to evaluate for DVT.   MEDICATIONS GIVEN IN ED: Medications  0.9 %  sodium chloride infusion ( Intravenous New Bag/Given 12/30/21 2309)  heparin ADULT infusion 100 units/mL (25000 units/221mL) (950 Units/hr Intravenous New Bag/Given 12/31/21 0045)  morphine (PF) 4 MG/ML injection 4 mg (4 mg Intravenous Given 12/30/21 2310)  ondansetron (ZOFRAN) injection 4 mg (4 mg Intravenous Given 12/30/21 2310)  aspirin chewable tablet 324 mg (324 mg Oral Given 12/30/21 2342)  iohexol (OMNIPAQUE) 350 MG/ML injection 75 mL (75 mLs Intravenous Contrast Given 12/30/21 2348)     ED COURSE: Patient's troponins are elevated in the 270s but flat.  EKG does show new T wave inversions.  Will give aspirin.  CT reviewed by myself and the radiologist.  I was contacted by the radiologist as patient has bilateral pulmonary emboli with signs of right heart strain.  On reevaluation her sats are 86% on room air.  She does not wear oxygen at home.  Doing well on 2 L nasal cannula.  No respiratory distress, hypotension.  We will start heparin and discuss with hospitalist for admission.   CONSULTS:  Consulted and discussed patient's case with hospitalist, Dr. Para March.  I have recommended admission and consulting physician agrees and will place admission orders.  Patient (and family if present) agree with this plan.   I reviewed all nursing notes, vitals, pertinent previous records.  All labs, EKGs, imaging ordered have been independently  reviewed and interpreted by myself.    OUTSIDE RECORDS REVIEWED: Reviewed patient's operative note on 12/09/2021 and last orthopedic visit on 12/23/2021.       FINAL CLINICAL IMPRESSION(S) / ED DIAGNOSES   Final diagnoses:  Shortness  of breath  Bilateral pulmonary embolism (HCC)  Acute respiratory failure with hypoxia ([place])     Rx / DC Orders   ED Discharge Orders     None        Note:  This document was prepared using Dragon voice recognition software and may include unintentional dictation errors.   Saralynn Langhorst, Delice Bison, DO 12/31/21 0117

## 2021-12-30 NOTE — ED Triage Notes (Signed)
Pt had a knee replacement on the 9th and began having shob since last night. Pt is on a blood thinner and possibly may have missed a day.

## 2021-12-30 NOTE — ED Provider Triage Note (Signed)
Emergency Medicine Provider Triage Evaluation Note  Korinne Greenstein , a 71 y.o. female  was evaluated in triage.  Pt complains of shortness of breath that started overnight and has worsened throughout the day. History of DVT and on Eliquis. May have missed a dose on Monday, but is no sure. Otherwise compliant with her medications. She had a knee replacement on 12/09/21 and has done well until this event.  Physical Exam  BP (!) 131/90 (BP Location: Right Arm)   Pulse 97   Temp 98.4 F (36.9 C) (Oral)   Resp 19   Ht 5\' 6"  (1.676 m)   Wt 90.7 kg   SpO2 97%   BMI 32.27 kg/m  Gen:   Awake, no distress   Resp:  Normal effort  MSK:   Moves extremities without difficulty  Other:    Medical Decision Making  Medically screening exam initiated at 6:23 PM.  Appropriate orders placed.  Dorrene Medal was informed that the remainder of the evaluation will be completed by another provider, this initial triage assessment does not replace that evaluation, and the importance of remaining in the ED until their evaluation is complete.  Shortness of breath protocol initiated.    Oralia Rud, FNP 12/30/21 2001

## 2021-12-30 NOTE — ED Notes (Signed)
Pt brought to ED rm 16 at this time, this RN now assuming care. 

## 2021-12-30 NOTE — Telephone Encounter (Signed)
Belenda Cruise from [facility] is calling for orders to continue P.T. for Leah Roberts.  They are asking for P.T. 2 times a week for 3 weeks.  Call back # is [phone].

## 2021-12-31 ENCOUNTER — Emergency Department: Payer: No Typology Code available for payment source

## 2021-12-31 ENCOUNTER — Inpatient Hospital Stay: Admit: 2021-12-31 | Payer: No Typology Code available for payment source

## 2021-12-31 DIAGNOSIS — I2699 Other pulmonary embolism without acute cor pulmonale: Principal | ICD-10-CM | POA: Diagnosis present

## 2021-12-31 DIAGNOSIS — J9601 Acute respiratory failure with hypoxia: Secondary | ICD-10-CM

## 2021-12-31 LAB — CBC
HCT: 34.1 % — ABNORMAL LOW (ref 36.0–46.0)
Hemoglobin: 10.5 g/dL — ABNORMAL LOW (ref 12.0–15.0)
MCH: 26.3 pg (ref 26.0–34.0)
MCHC: 30.8 g/dL (ref 30.0–36.0)
MCV: 85.5 fL (ref 80.0–100.0)
Platelets: 253 10*3/uL (ref 150–400)
RBC: 3.99 MIL/uL (ref 3.87–5.11)
RDW: 15.9 % — ABNORMAL HIGH (ref 11.5–15.5)
WBC: 6.4 10*3/uL (ref 4.0–10.5)
nRBC: 0 % (ref 0.0–0.2)

## 2021-12-31 LAB — APTT
aPTT: 32 seconds (ref 24–36)
aPTT: 58 seconds — ABNORMAL HIGH (ref 24–36)
aPTT: 71 seconds — ABNORMAL HIGH (ref 24–36)
aPTT: 84 seconds — ABNORMAL HIGH (ref 24–36)

## 2021-12-31 LAB — HEPARIN LEVEL (UNFRACTIONATED)
Heparin Unfractionated: >1.1 IU/mL — ABNORMAL HIGH (ref 0.30–0.70)
Heparin Unfractionated: >1.1 IU/mL — ABNORMAL HIGH (ref 0.30–0.70)

## 2021-12-31 LAB — PROTIME-INR
INR: 1.5 — ABNORMAL HIGH (ref 0.8–1.2)
Prothrombin Time: 18.2 seconds — ABNORMAL HIGH (ref 11.4–15.2)

## 2021-12-31 LAB — RESP PANEL BY RT-PCR (FLU A&B, COVID) ARPGX2
Influenza A by PCR: NEGATIVE
Influenza B by PCR: NEGATIVE
SARS Coronavirus 2 by RT PCR: NEGATIVE

## 2021-12-31 LAB — TROPONIN I (HIGH SENSITIVITY): Troponin I (High Sensitivity): 267 ng/L (ref <18)

## 2021-12-31 LAB — HIV ANTIBODY (ROUTINE TESTING W REFLEX): HIV Screen 4th Generation wRfx: NONREACTIVE

## 2021-12-31 MED ORDER — METHYLPREDNISOLONE SODIUM SUCC 125 MG IJ SOLR: 125.0000 mg | Freq: Once | INTRAMUSCULAR | Status: DC | PRN

## 2021-12-31 MED ORDER — SODIUM CHLORIDE 0.9 % IV SOLN: INTRAVENOUS | Status: DC

## 2021-12-31 MED ORDER — OXYCODONE HCL 5 MG PO TABS: 5.0000 mg | ORAL_TABLET | ORAL | Status: DC | PRN

## 2021-12-31 MED ORDER — TRAZODONE HCL 50 MG PO TABS
50.0000 mg | ORAL_TABLET | Freq: Every evening | ORAL | Status: DC | PRN
  Administered 2022-01-03: 50 mg via ORAL
  Filled 2021-12-31: qty 1

## 2021-12-31 MED ORDER — HYDRALAZINE HCL 20 MG/ML IJ SOLN: 10.0000 mg | INTRAMUSCULAR | Status: DC | PRN

## 2021-12-31 MED ORDER — FAMOTIDINE 20 MG PO TABS: 40.0000 mg | ORAL_TABLET | Freq: Once | ORAL | Status: DC | PRN

## 2021-12-31 MED ORDER — HYDROMORPHONE HCL 1 MG/ML IJ SOLN: 1.0000 mg | Freq: Once | INTRAMUSCULAR | Status: DC | PRN

## 2021-12-31 MED ORDER — ONDANSETRON HCL 4 MG/2ML IJ SOLN: 4.0000 mg | Freq: Four times a day (QID) | INTRAMUSCULAR | Status: DC | PRN

## 2021-12-31 MED ORDER — HEPARIN (PORCINE) 25000 UT/250ML-% IV SOLN
1400.0000 [IU]/h | INTRAVENOUS | Status: DC
  Administered 2021-12-31: 950 [IU]/h via INTRAVENOUS
  Administered 2021-12-31: 1100 [IU]/h via INTRAVENOUS
  Filled 2021-12-31 (×2): qty 250

## 2021-12-31 MED ORDER — METOPROLOL TARTRATE 5 MG/5ML IV SOLN: 5.0000 mg | INTRAVENOUS | Status: DC | PRN

## 2021-12-31 MED ORDER — ACETAMINOPHEN 650 MG RE SUPP: 650.0000 mg | Freq: Four times a day (QID) | RECTAL | Status: DC | PRN

## 2021-12-31 MED ORDER — CEFAZOLIN SODIUM-DEXTROSE 2-4 GM/100ML-% IV SOLN
2.0000 g | INTRAVENOUS | Status: AC
  Filled 2021-12-31: qty 100

## 2021-12-31 MED ORDER — SENNOSIDES-DOCUSATE SODIUM 8.6-50 MG PO TABS
1.0000 | ORAL_TABLET | Freq: Every evening | ORAL | Status: DC | PRN
  Administered 2022-01-01 – 2022-01-02 (×2): 1 via ORAL
  Filled 2021-12-31 (×2): qty 1

## 2021-12-31 MED ORDER — ONDANSETRON HCL 4 MG PO TABS: 4.0000 mg | ORAL_TABLET | Freq: Four times a day (QID) | ORAL | Status: DC | PRN

## 2021-12-31 MED ORDER — GUAIFENESIN 100 MG/5ML PO LIQD
5.0000 mL | ORAL | Status: DC | PRN
  Filled 2021-12-31: qty 10

## 2021-12-31 MED ORDER — IPRATROPIUM-ALBUTEROL 0.5-2.5 (3) MG/3ML IN SOLN: 3.0000 mL | RESPIRATORY_TRACT | Status: DC | PRN

## 2021-12-31 MED ORDER — ACETAMINOPHEN 325 MG PO TABS: 650.0000 mg | ORAL_TABLET | Freq: Four times a day (QID) | ORAL | Status: DC | PRN

## 2021-12-31 MED ORDER — HEPARIN BOLUS VIA INFUSION
1200.0000 [IU] | Freq: Once | INTRAVENOUS | Status: AC
  Administered 2021-12-31: 1200 [IU] via INTRAVENOUS
  Filled 2021-12-31: qty 1200

## 2021-12-31 MED ORDER — MORPHINE SULFATE (PF) 2 MG/ML IV SOLN
2.0000 mg | INTRAVENOUS | Status: DC | PRN
  Administered 2021-12-31 – 2022-01-01 (×3): 2 mg via INTRAVENOUS
  Filled 2021-12-31 (×3): qty 1

## 2021-12-31 MED ORDER — DOCUSATE SODIUM 100 MG PO CAPS
100.0000 mg | ORAL_CAPSULE | Freq: Two times a day (BID) | ORAL | Status: DC
  Administered 2021-12-31 – 2022-01-04 (×7): 100 mg via ORAL
  Filled 2021-12-31 (×8): qty 1

## 2021-12-31 MED ORDER — MIDAZOLAM HCL 2 MG/ML PO SYRP: 8.0000 mg | ORAL_SOLUTION | Freq: Once | ORAL | Status: DC | PRN

## 2021-12-31 MED ORDER — DIPHENHYDRAMINE HCL 50 MG/ML IJ SOLN: 50.0000 mg | Freq: Once | INTRAMUSCULAR | Status: DC | PRN

## 2021-12-31 NOTE — H&P (View-Only) (Signed)
[facility] Vascular Consult Note  MRN : [id number]  Leah Roberts is a 71 y.o. (1950/09/14) female who presents with chief complaint of  Chief Complaint  Patient presents with   Shortness of Breath  .   Consulting Physician: Lindajo Royal, MD Reason for consult: Pulmonary embolism History of Present Illness: The patient is a 71 year old female that presents to [facility] after having 2 days of sharp left-sided chest pain and shortness of breath.  The patient has a previous history of pulmonary embolism in 2014 as well as in 2013.  She also recently underwent a left knee replacement about 3 weeks ago.  She notes that she is generally compliant with her Eliquis however she notes that she may have missed a dose or 2 over the last few years.  Initially the patient had some hypoxia requiring 2 L nasal cannula however currently she is at rest with room air with O2 sats in the 90s.  The patient underwent a CT scan which showed bilateral pulmonary embolism with evidence of right heart strain.  There is no DVT noted bilaterally however there is evidence of Baker's cyst laterally.  Current Facility-Administered Medications  Medication Dose Route Frequency Provider Last Rate Last Admin   0.9 %  sodium chloride infusion   Intravenous Continuous Ward, Layla Maw, DO 125 mL/hr at 12/30/21 2309 New Bag at 12/30/21 2309   acetaminophen (TYLENOL) tablet 650 mg  650 mg Oral Q6H PRN Andris Baumann, MD       Or   acetaminophen (TYLENOL) suppository 650 mg  650 mg Rectal Q6H PRN Andris Baumann, MD       docusate sodium (COLACE) capsule 100 mg  100 mg Oral BID Amin, Ankit Chirag, MD   100 mg at 12/31/21 1108   guaiFENesin (ROBITUSSIN) 100 MG/5ML liquid 5 mL  5 mL Oral Q4H PRN Amin, Ankit Chirag, MD       heparin ADULT infusion 100 units/mL (25000 units/222mL)  1,100 Units/hr Intravenous Continuous Celene Squibb, RPH 11 mL/hr at 12/31/21 1108 1,100 Units/hr  at 12/31/21 1108   hydrALAZINE (APRESOLINE) injection 10 mg  10 mg Intravenous Q4H PRN Amin, Ankit Chirag, MD       ipratropium-albuterol (DUONEB) 0.5-2.5 (3) MG/3ML nebulizer solution 3 mL  3 mL Nebulization Q4H PRN Amin, Ankit Chirag, MD       metoprolol tartrate (LOPRESSOR) injection 5 mg  5 mg Intravenous Q4H PRN Amin, Ankit Chirag, MD       morphine (PF) 2 MG/ML injection 2 mg  2 mg Intravenous Q2H PRN Andris Baumann, MD   2 mg at 12/31/21 0746   ondansetron (ZOFRAN) tablet 4 mg  4 mg Oral Q6H PRN Andris Baumann, MD       Or   ondansetron [facility]) injection 4 mg  4 mg Intravenous Q6H PRN Andris Baumann, MD       oxyCODONE (Oxy IR/ROXICODONE) immediate release tablet 5 mg  5 mg Oral Q4H PRN Amin, Ankit Chirag, MD       senna-docusate (Senokot-S) tablet 1 tablet  1 tablet Oral QHS PRN Amin, Ankit Chirag, MD       traZODone (DESYREL) tablet 50 mg  50 mg Oral QHS PRN Amin, Loura Halt, MD        Past Medical History:  Diagnosis Date   Arthritis    DVT (deep venous thrombosis) (HCC) 2013   Dyspnea    due to knee pain   Family  history of adverse reaction to anesthesia    Brother and sister hard to wake up.   GERD (gastroesophageal reflux disease)    History of blood transfusion    History of hypertension    Hypertension    Peripheral vascular disease (HCC)    DVT   Pre-diabetes    Pulmonary embolism (HCC) 2013    Past Surgical History:  Procedure Laterality Date   APPENDECTOMY     TONSILLECTOMY AND ADENOIDECTOMY     TOTAL KNEE ARTHROPLASTY Left 12/09/2021   Procedure: LEFT TOTAL KNEE ARTHROPLASTY;  Surgeon: Nadara Mustard, MD;  Location: [facility] OR;  Service: Orthopedics;  Laterality: Left;    Social History Social History   Tobacco Use   Smoking status: Never   Smokeless tobacco: Never  Substance Use Topics   Alcohol use: Yes    Comment: Ocassional glass of wine   Drug use: No    Family History Family History  Problem Relation Age of Onset   Hypertension Father     Heart disease Father 73   Thyroid disease Sister    Allergies Sister    CVA Neg Hx    Heart attack Neg Hx    High Cholesterol Neg Hx    Colon cancer Neg Hx     Allergies  Allergen Reactions   Sulfa Antibiotics Swelling and Rash     REVIEW OF SYSTEMS (Negative unless checked)  Constitutional: [] Weight loss  [] Fever  [] Chills Cardiac: [] Chest pain   [] Chest pressure   [] Palpitations   [] Shortness of breath when laying flat   [] Shortness of breath at rest   [x] Shortness of breath with exertion. Vascular:  [] Pain in legs with walking   [] Pain in legs at rest   [] Pain in legs when laying flat   [] Claudication   [] Pain in feet when walking  [] Pain in feet at rest  [] Pain in feet when laying flat   [] History of DVT   [] Phlebitis   [x] Swelling in legs   [] Varicose veins   [] Non-healing ulcers Pulmonary:   [] Uses home oxygen   [] Productive cough   [] Hemoptysis   [] Wheeze  [] COPD   [] Asthma Neurologic:  [] Dizziness  [] Blackouts   [] Seizures   [] History of stroke   [] History of TIA  [] Aphasia   [] Temporary blindness   [] Dysphagia   [] Weakness or numbness in arms   [] Weakness or numbness in legs Musculoskeletal:  [] Arthritis   [] Joint swelling   [] Joint pain   [] Low back pain Hematologic:  [] Easy bruising  [] Easy bleeding   [] Hypercoagulable state   [] Anemic  [] Hepatitis Gastrointestinal:  [] Blood in stool   [] Vomiting blood  [] Gastroesophageal reflux/heartburn   [] Difficulty swallowing. Genitourinary:  [] Chronic kidney disease   [] Difficult urination  [] Frequent urination  [] Burning with urination   [] Blood in urine Skin:  [] Rashes   [] Ulcers   [] Wounds Psychological:  [] History of anxiety   []  History of major depression.  Physical Examination  Vitals:   12/31/21 1700 12/31/21 1801 12/31/21 1900 12/31/21 2130  BP:   122/75 117/89  Pulse:   78 77  Resp:   20 20  Temp: 98.1 F (36.7 C) 98.1 F (36.7 C) 99 F (37.2 C) 98.2 F (36.8 C)  TempSrc: Oral   Oral  SpO2:   98% 100%  Weight:       Height:       Body mass index is 32.27 kg/m. Gen:  WD/WN, NAD Head: Ada/AT, No temporalis wasting. Prominent temp pulse not noted. Ear/Nose/Throat: Hearing  grossly intact, nares w/o erythema or drainage, oropharynx w/o Erythema/Exudate Eyes: Sclera non-icteric, conjunctiva clear Neck: Trachea midline.  No JVD.  Pulmonary:  Good air movement, respirations not labored, equal bilaterally at rest, Cardiac: RRR, normal S1, S2. Vascular: 1+ edema bilaterally Vessel Right Left  Radial Palpable Palpable  PT Palpable Palpable  DP Palpable Palpable   Gastrointestinal: soft, non-tender/non-distended. No guarding/reflex.  Musculoskeletal: M/S 5/5 throughout.  Extremities without ischemic changes.  No deformity or atrophy Neurologic: Sensation grossly intact in extremities.  Symmetrical.  Speech is fluent. Motor exam as listed above. Psychiatric: Judgment intact, Mood & affect appropriate for pt's clinical situation. Dermatologic: No rashes or ulcers noted.  No cellulitis or open wounds. Lymph : No Cervical, Axillary, or Inguinal lymphadenopathy.    CBC Lab Results  Component Value Date   WBC 6.4 12/31/2021   HGB 10.5 (L) 12/31/2021   HCT 34.1 (L) 12/31/2021   MCV 85.5 12/31/2021   PLT 253 12/31/2021    BMET    Component Value Date/Time   NA 139 12/30/2021 1847   K 4.0 12/30/2021 1847   CL 110 12/30/2021 1847   CO2 19 (L) 12/30/2021 1847   GLUCOSE 121 (H) 12/30/2021 1847   BUN 13 12/30/2021 1847   CREATININE 0.87 12/30/2021 1847   CALCIUM 9.4 12/30/2021 1847   GFRNONAA >60 12/30/2021 1847   GFRAA >60 01/01/2020 0236   Estimated Creatinine Clearance: 69.3 mL/min (by C-G formula based on SCr of 0.87 mg/dL).  COAG Lab Results  Component Value Date   INR 1.5 (H) 12/31/2021   INR 1.5 (H) 06/18/2016    Radiology US Venous Img Lower Bilateral  Result Date: 12/31/2021 CLINICAL DATA:  Recent left knee replacement presenting with chest pain and shortness of breath. EXAM:  BILATERAL LOWER EXTREMITY VENOUS DOPPLER ULTRASOUND TECHNIQUE: Gray-scale sonography with graded compression, as well as color Doppler and duplex ultrasound were performed to evaluate the lower extremity deep venous systems from the level of the common femoral vein and including the common femoral, femoral, profunda femoral, popliteal and calf veins including the posterior tibial, peroneal and gastrocnemius veins when visible. The superficial great saphenous vein was also interrogated. Spectral Doppler was utilized to evaluate flow at rest and with distal augmentation maneuvers in the common femoral, femoral and popliteal veins. COMPARISON:  None Available. FINDINGS: RIGHT LOWER EXTREMITY Common Femoral Vein: No evidence of thrombus. Normal compressibility, respiratory phasicity and response to augmentation. Saphenofemoral Junction: No evidence of thrombus. Normal compressibility and flow on color Doppler imaging. Profunda Femoral Vein: No evidence of thrombus. Normal compressibility and flow on color Doppler imaging. Femoral Vein: No evidence of thrombus. Normal compressibility, respiratory phasicity and response to augmentation. Popliteal Vein: No evidence of thrombus. Normal compressibility, respiratory phasicity and response to augmentation. Calf Veins: No evidence of thrombus. Normal compressibility and flow on color Doppler imaging. Superficial Great Saphenous Vein: No evidence of thrombus. Normal compressibility. Venous Reflux:  None. Other Findings: A 2.7 cm x 0.6 cm x 2.5 cm anechoic structure is seen within the soft tissues of the right popliteal fossa. No abnormal flow is noted within this region on color Doppler evaluation. LEFT LOWER EXTREMITY Common Femoral Vein: No evidence of thrombus. Normal compressibility, respiratory phasicity and response to augmentation. Saphenofemoral Junction: No evidence of thrombus. Normal compressibility and flow on color Doppler imaging. Profunda Femoral Vein: No evidence  of thrombus. Normal compressibility and flow on color Doppler imaging. Femoral Vein: No evidence of thrombus. Normal compressibility, respiratory phasicity and response to augmentation. Popliteal  Vein: No evidence of thrombus. Normal compressibility, respiratory phasicity and response to augmentation. Calf Veins: No evidence of thrombus. Normal compressibility and flow on color Doppler imaging. Superficial Great Saphenous Vein: No evidence of thrombus. Normal compressibility. Venous Reflux:  None. Other Findings: A 3.9 cm x 1.1 cm x 2.7 cm hypoechoic structure is seen within the soft tissues of the left popliteal fossa. No abnormal flow is noted within this region on color Doppler evaluation. IMPRESSION: 1. No evidence of deep venous thrombosis within the RIGHT lower extremity or LEFT lower extremity. 2. Findings consistent with BILATERAL Baker's cysts. Electronically Signed   By: Thaddeus  Houston M.D.   On: 12/31/2021 01:29   CT Angio Chest PE W and/or Wo Contrast  Result Date: 12/31/2021 CLINICAL DATA:  Concern for pulmonary embolism. EXAM: CT ANGIOGRAPHY CHEST WITH CONTRAST TECHNIQUE: Multidetector CT imaging of the chest was performed using the standard protocol during bolus administration of intravenous contrast. Multiplanar CT image reconstructions and MIPs were obtained to evaluate the vascular anatomy. RADIATION DOSE REDUCTION: This exam was performed according to the departmental dose-optimization program which includes automated exposure control, adjustment of the mA and/or kV according to patient size and/or use of iterative reconstruction technique. CONTRAST:  75mL OMNIPAQUE IOHEXOL 350 MG/ML SOLN COMPARISON:  Chest radiograph dated 12/30/2021. CT dated 01/01/2020. FINDINGS: Cardiovascular: Top-normal cardiac size. No pericardial effusion. There is retrograde flow of contrast from the right atrium into the IVC suggestive of right heart dysfunction. There is enlargement of the right heart chambers  with RV/LV ratio of 1.5 consistent with right heart straining. Mild atherosclerotic calcification of the aortic arch. No aneurysmal dilatation or dissection. The origins of the great vessels of the aortic arch appear patent. There is dilatation of the pulmonary arteries suggestive of pulmonary hypertension. Moderate-sized bilateral pulmonary artery emboli involving the lobar pulmonary artery in the lingula and right middle lobe as well as additional segmental emboli bilaterally. Mediastinum/Nodes: No hilar or mediastinal adenopathy. The esophagus and the thyroid gland are grossly unremarkable. No mediastinal fluid collection. Lungs/Pleura: No focal consolidation, pleural effusion, or pneumothorax. The central airways are patent. Upper Abdomen: No acute abnormality. Musculoskeletal: Mild degenerative changes. No acute osseous pathology. Review of the MIP images confirms the above findings. IMPRESSION: 1. Positive for acute PE with CT evidence of right heart strain (RV/LV Ratio = 1.5) consistent with at least submassive (intermediate risk) PE. The presence of right heart strain has been associated with an increased risk of morbidity and mortality. Please refer to the "Code PE Focused" order set in EPIC. 2. Aortic Atherosclerosis (ICD10-I70.0). Electronically Signed   By: Arash  Radparvar M.D.   On: 12/31/2021 00:13   DG Chest Port 1 View  Result Date: 12/30/2021 CLINICAL DATA:  Shortness of breath EXAM: PORTABLE CHEST 1 VIEW COMPARISON:  12/31/2019 FINDINGS: Cardiomegaly with mild central congestion. No acute airspace disease, pleural effusion or pneumothorax. Central pulmonary vessels appear slightly enlarged. IMPRESSION: 1. Cardiomegaly with mild central congestion 2. Prominent central pulmonary vessels, question due to pulmonary arterial hypertension. Electronically Signed   By: Kim  Fujinaga M.D.   On: 12/30/2021 19:09   XR Knee 1-2 Views Left  Result Date: 12/23/2021 Radiographs of the left knee show  stable alignment total knee arthroplasty hardware.  No sign of loosening.     Assessment/Plan 1.  Pulmonary embolism  Currently the patient is a submassive bilateral pulmonary embolism with right heart strain.  Given the patient's recurrent pulmonary embolisms, the risk for scarring and long-term consequences is increased.    Based on this the patient would be a candidate for pulmonary thrombectomy.  I have discussed the procedure with the patient as well as the risk benefits and alternatives and she is agreeable to proceed.  Based on patient's description of timing with pulmonary embolism this is likely not a failure of her Eliquis.  Patient is recommended to continue with Eliquis at discharge 2.  Essential hypertension Currently not on medications outpatient, no recommendations for addition.   Family Communication: None present at bedside however contacted the patient's son and daughter-in-law by phone as patient requested.  Was able to discuss procedure as well as risk, benefits and alternatives with the patient's son  Thank you for allowing [place] to participate in the care of this patient.   Georgiana Spinner, NP [facility] [phone] (Office Phone) [phone] (Office Fax) [phone] (Pager)  12/31/2021 9:46 PM  Staff may message me via secure chat in Epic  but this may not receive immediate response,  please page for urgent matters!  Dictation software was used to generate the above note. Typos may occur and escape review, as with typed/written notes. Any error is purely unintentional.  Please contact me directly for clarity if needed.

## 2021-12-31 NOTE — H&P (Signed)
History and Physical    Patient: Leah Roberts CHY:[id number] DOB: [birth year] DOA: 12/30/2021 DOS: the patient was seen and examined on 12/31/2021 PCP: Allegra Grana, FNP  Patient coming from: Home  Chief Complaint:  Chief Complaint  Patient presents with   Shortness of Breath    HPI: Leah Roberts is a 71 y.o. female with medical history significant for DVT in 2013 following a 16-hour plane flight followed by a PE in 2014, currently on Eliquis and who underwent left total knee replacement on 12/09/2021 who presents to the ED with a 2-day history of sharp left-sided chest pain and shortness of breath, worse with exertion.  She reports having missed a dose or 2 of Eliquis a few days prior otherwise has been compliant.  She has been on Eliquis for the past 2 years and previously was on Coumadin.  She denies cough fever or chills.  Denies pain in the legs. ED course and data review: BP 131/90 with pulse 97.  O2 sat was as low as 87% requiring 2 L to maintain sats of 100%.  Blood work showed D-dimer of 10, hemoglobin 11.2, troponin 275 and BNP 146.  EKG personally viewed and interpreted showed sinus at 96 with nonspecific ST-T wave changes CTA chest was positive for acute PE with CT evidence of right heart strain consistent with at least submassive PE, RV LV ratio 1.5.  Left lower extremity Doppler pending. Patient started on a heparin infusion and hospitalist consulted for admission.     Past Medical History:  Diagnosis Date   Arthritis    DVT (deep venous thrombosis) (HCC) 2013   Dyspnea    due to knee pain   Family history of adverse reaction to anesthesia    Brother and sister hard to wake up.   GERD (gastroesophageal reflux disease)    History of blood transfusion    History of hypertension    Hypertension    Peripheral vascular disease (HCC)    DVT   Pre-diabetes    Pulmonary embolism (HCC) 2013   Past Surgical History:  Procedure Laterality Date   APPENDECTOMY      TONSILLECTOMY AND ADENOIDECTOMY     TOTAL KNEE ARTHROPLASTY Left 12/09/2021   Procedure: LEFT TOTAL KNEE ARTHROPLASTY;  Surgeon: Nadara Mustard, MD;  Location: [facility] OR;  Service: Orthopedics;  Laterality: Left;   Social History:  reports that she has never smoked. She has never used smokeless tobacco. She reports current alcohol use. She reports that she does not use drugs.  Allergies  Allergen Reactions   Sulfa Antibiotics Swelling and Rash    Family History  Problem Relation Age of Onset   Hypertension Father    Heart disease Father 14   Thyroid disease Sister    Allergies Sister    CVA Neg Hx    Heart attack Neg Hx    High Cholesterol Neg Hx    Colon cancer Neg Hx     Prior to Admission medications   Medication Sig Start Date End Date Taking? Authorizing Provider  apixaban (ELIQUIS) 5 MG TABS tablet Take 1 tablet (5 mg total) by mouth 2 (two) times daily. 03/17/21   Allegra Grana, FNP  calcium carbonate (TUMS - DOSED IN MG ELEMENTAL CALCIUM) 500 MG chewable tablet Chew 1 tablet by mouth daily as needed for indigestion or heartburn.    [provider]  lidocaine (LIDODERM) 5 % Place 1 patch onto the skin every 12 (twelve) hours. Remove & Discard patch within  12 hours or as directed by MD 10/28/21 10/28/22  Allegra Grana, FNP  loratadine (CLARITIN) 10 MG tablet Take 10 mg by mouth daily.    [provider]  Menthol, Topical Analgesic, (BIOFREEZE EX) Apply 1 Application topically daily as needed (leg pain).    [provider]  methocarbamol (ROBAXIN) 500 MG tablet Take 1 tablet (500 mg total) by mouth every 8 (eight) hours as needed for muscle spasms. 12/28/21   Nadara Mustard, MD  Multiple Vitamin (MULTIVITAMIN WITH MINERALS) TABS tablet Take 1 tablet by mouth 2 (two) times a week.    [provider]  oxyCODONE-acetaminophen (PERCOCET/ROXICET) 5-325 MG tablet Take 1 tablet by mouth every 4 (four) hours as needed. 12/28/21   Nadara Mustard, MD     Physical Exam: Vitals:   12/30/21 2350 12/31/21 0000 12/31/21 0008 12/31/21 0030  BP:  120/79  (!) 136/97  Pulse:  82 83 95  Resp:  18 16 20   Temp:      TempSrc:      SpO2: (!) 87% 93%  100%  Weight:      Height:       Physical Exam Vitals and nursing note reviewed.  Constitutional:      General: She is not in acute distress. HENT:     Head: Normocephalic and atraumatic.  Cardiovascular:     Rate and Rhythm: Normal rate and regular rhythm.     Heart sounds: Normal heart sounds.  Pulmonary:     Effort: Pulmonary effort is normal.     Breath sounds: Normal breath sounds.  Abdominal:     Palpations: Abdomen is soft.     Tenderness: There is no abdominal tenderness.  Neurological:     Mental Status: Mental status is at baseline.     Labs on Admission: I have personally reviewed following labs and imaging studies  CBC: Recent Labs  Lab 12/30/21 1847  WBC 8.0  HGB 11.2*  HCT 35.8*  MCV 83.8  PLT 275   Basic Metabolic Panel: Recent Labs  Lab 12/30/21 1847  NA 139  K 4.0  CL 110  CO2 19*  GLUCOSE 121*  BUN 13  CREATININE 0.87  CALCIUM 9.4   GFR: Estimated Creatinine Clearance: 69.3 mL/min (by C-G formula based on SCr of 0.87 mg/dL). Liver Function Tests: Recent Labs  Lab 12/30/21 1847  AST 21  ALT 10  ALKPHOS 67  BILITOT 0.7  PROT 7.5  ALBUMIN 3.7   No results for input(s): "LIPASE", "AMYLASE" in the last 168 hours. No results for input(s): "AMMONIA" in the last 168 hours. Coagulation Profile: Recent Labs  Lab 12/31/21 0040  INR 1.5*   Cardiac Enzymes: No results for input(s): "CKTOTAL", "CKMB", "CKMBINDEX", "TROPONINI" in the last 168 hours. BNP (last 3 results) No results for input(s): "PROBNP" in the last 8760 hours. HbA1C: No results for input(s): "HGBA1C" in the last 72 hours. CBG: No results for input(s): "GLUCAP" in the last 168 hours. Lipid Profile: No results for input(s): "CHOL", "HDL", "LDLCALC", "TRIG", "CHOLHDL",  "LDLDIRECT" in the last 72 hours. Thyroid Function Tests: No results for input(s): "TSH", "T4TOTAL", "FREET4", "T3FREE", "THYROIDAB" in the last 72 hours. Anemia Panel: No results for input(s): "VITAMINB12", "FOLATE", "FERRITIN", "TIBC", "IRON", "RETICCTPCT" in the last 72 hours. Urine analysis:    Component Value Date/Time   COLORURINE YELLOW (A) 11/02/2015 1335   APPEARANCEUR HAZY (A) 11/02/2015 1335   LABSPEC 1.018 11/02/2015 1335   PHURINE 5.0 11/02/2015 1335   GLUCOSEU  NEGATIVE 11/02/2015 1335   HGBUR NEGATIVE 11/02/2015 1335   BILIRUBINUR NEGATIVE 11/02/2015 1335   KETONESUR NEGATIVE 11/02/2015 1335   PROTEINUR NEGATIVE 11/02/2015 1335   NITRITE NEGATIVE 11/02/2015 1335   LEUKOCYTESUR 1+ (A) 11/02/2015 1335    Radiological Exams on Admission: CT Angio Chest PE W and/or Wo Contrast  Result Date: 12/31/2021 CLINICAL DATA:  Concern for pulmonary embolism. EXAM: CT ANGIOGRAPHY CHEST WITH CONTRAST TECHNIQUE: Multidetector CT imaging of the chest was performed using the standard protocol during bolus administration of intravenous contrast. Multiplanar CT image reconstructions and MIPs were obtained to evaluate the vascular anatomy. RADIATION DOSE REDUCTION: This exam was performed according to the departmental dose-optimization program which includes automated exposure control, adjustment of the mA and/or kV according to patient size and/or use of iterative reconstruction technique. CONTRAST:  39mL OMNIPAQUE IOHEXOL 350 MG/ML SOLN COMPARISON:  Chest radiograph dated 12/30/2021. CT dated 01/01/2020. FINDINGS: Cardiovascular: Top-normal cardiac size. No pericardial effusion. There is retrograde flow of contrast from the right atrium into the IVC suggestive of right heart dysfunction. There is enlargement of the right heart chambers with RV/LV ratio of 1.5 consistent with right heart straining. Mild atherosclerotic calcification of the aortic arch. No aneurysmal dilatation or dissection. The  origins of the great vessels of the aortic arch appear patent. There is dilatation of the pulmonary arteries suggestive of pulmonary hypertension. Moderate-sized bilateral pulmonary artery emboli involving the lobar pulmonary artery in the lingula and right middle lobe as well as additional segmental emboli bilaterally. Mediastinum/Nodes: No hilar or mediastinal adenopathy. The esophagus and the thyroid gland are grossly unremarkable. No mediastinal fluid collection. Lungs/Pleura: No focal consolidation, pleural effusion, or pneumothorax. The central airways are patent. Upper Abdomen: No acute abnormality. Musculoskeletal: Mild degenerative changes. No acute osseous pathology. Review of the MIP images confirms the above findings. IMPRESSION: 1. Positive for acute PE with CT evidence of right heart strain (RV/LV Ratio = 1.5) consistent with at least submassive (intermediate risk) PE. The presence of right heart strain has been associated with an increased risk of morbidity and mortality. Please refer to the "Code PE Focused" order set in EPIC. 2. Aortic Atherosclerosis (ICD10-I70.0). Electronically Signed   By: Elgie Collard M.D.   On: 12/31/2021 00:13   DG Chest Port 1 View  Result Date: 12/30/2021 CLINICAL DATA:  Shortness of breath EXAM: PORTABLE CHEST 1 VIEW COMPARISON:  12/31/2019 FINDINGS: Cardiomegaly with mild central congestion. No acute airspace disease, pleural effusion or pneumothorax. Central pulmonary vessels appear slightly enlarged. IMPRESSION: 1. Cardiomegaly with mild central congestion 2. Prominent central pulmonary vessels, question due to pulmonary arterial hypertension. Electronically Signed   By: Jasmine Pang M.D.   On: 12/30/2021 19:09     Data Reviewed: Relevant notes from primary care and specialist visits, past discharge summaries as available in EHR, including Care Everywhere. Prior diagnostic testing as pertinent to current admission diagnoses Updated medications and problem  lists for reconciliation ED course, including vitals, labs, imaging, treatment and response to treatment Triage notes, nursing and pharmacy notes and ED provider's notes Notable results as noted in HPI   Assessment and Plan: * Bilateral pulmonary embolism with right heart strain RV/LV ratio 1:5 (HCC) History of DVT and PE 2013 in 2014 Chronic anticoagulation Continue heparin infusion We will get echocardiogram to evaluate degree of right heart strain We will consult vascular to evaluate for need for thrombectomy/thrombolytic therapy Keep n.p.o. in case of procedure Consider hematology consult  Acute respiratory failure with hypoxia (HCC) O2 sat was  87% in the ED patient was tachypneic, requiring 2 L to maintain sats at 100% Continue supplemental O2 via nasal cannula and wean as tolerated        DVT prophylaxis: Heparin infusion  Consults: Vascular, Dr. Marena Chancy  Advance Care Planning:   Code Status: Prior   Family Communication: none  Disposition Plan: Back to previous home environment  Severity of Illness: The appropriate patient status for this patient is INPATIENT. Inpatient status is judged to be reasonable and necessary in order to provide the required intensity of service to ensure the patient's safety. The patient's presenting symptoms, physical exam findings, and initial radiographic and laboratory data in the context of their chronic comorbidities is felt to place them at high risk for further clinical deterioration. Furthermore, it is not anticipated that the patient will be medically stable for discharge from the hospital within 2 midnights of admission.   * I certify that at the point of admission it is my clinical judgment that the patient will require inpatient hospital care spanning beyond 2 midnights from the point of admission due to high intensity of service, high risk for further deterioration and high frequency of surveillance required.*  Author: Andris Baumann, MD 12/31/2021 1:11 AM  For on call review [email or web address].

## 2021-12-31 NOTE — Progress Notes (Addendum)
ANTICOAGULATION CONSULT NOTE - Initial Consult  Pharmacy Consult for Heparin  Indication: PE  Allergies  Allergen Reactions   Sulfa Antibiotics Swelling and Rash    Patient Measurements: Height: 5\' 6"  (167.6 cm) Weight: 90.7 kg (199 lb 15.3 oz) IBW/kg (Calculated) : 59.3 Heparin Dosing Weight: 79.1 kg   Vital Signs: Temp: 98.4 F (36.9 C) (08/30 1821) Temp Source: Oral (08/30 1821) BP: 120/79 (08/31 0000) Pulse Rate: 83 (08/31 0008)  Labs: Recent Labs    12/30/21 1847 12/30/21 2313  HGB 11.2*  --   HCT 35.8*  --   PLT 275  --   CREATININE 0.87  --   TROPONINIHS 271* 275*    Estimated Creatinine Clearance: 69.3 mL/min (by C-G formula based on SCr of 0.87 mg/dL).   Medical History: Past Medical History:  Diagnosis Date   Arthritis    DVT (deep venous thrombosis) (HCC) 2013   Dyspnea    due to knee pain   Family history of adverse reaction to anesthesia    Brother and sister hard to wake up.   GERD (gastroesophageal reflux disease)    History of blood transfusion    History of hypertension    Hypertension    Peripheral vascular disease (HCC)    DVT   Pre-diabetes    Pulmonary embolism (HCC) 2013    Medications:  (Not in a hospital admission)   Assessment: Pharmacy consulted to dose heparin in this 71 year old female admitted with PE.  Pt was on Eliquis 5 mg PO BID ,  last dose was in ED lobby around 8/31 @ 2300.  CrCl = 69.3 ml/min   Goal of Therapy:  INR 2-3 aPTT 66 - 102  seconds Monitor platelets by anticoagulation protocol: Yes   Plan:  Pt received Eliquis 5 mg PO on 8/30 @ ~ 2300.  Will not bolus this pt but will start heparin drip at 950 units/hr.  Will use aPTT to guide dosing until HL and aPTT correlate. Will check HL daily.    Chemika Nightengale D 12/31/2021,12:32 AM

## 2021-12-31 NOTE — Assessment & Plan Note (Signed)
History of DVT and PE 2013 in 2014 Chronic anticoagulation Continue heparin infusion We will get echocardiogram to evaluate degree of right heart strain We will consult vascular to evaluate for need for thrombectomy/thrombolytic therapy Keep n.p.o. in case of procedure Consider hematology consult

## 2021-12-31 NOTE — Progress Notes (Signed)
Patient arrives floor at 21:30 pm. Patient is alert and oriented. Calm and cooperative. She has Heparin going at 11 ml/hr in Left anterior forearm 20 G IV. She requests Morphine 2 mg for pain rated 8/10. Administered IV.  Skin assessment is completed with Charge Nurse, Rosalia Hammers, RN. See Flowsheets.  New approximated surgical incision on left lower limb extremity running vertically from knee to shin. Edema +1 on that limb.  Patient is on 2 L of O2 [place].   Patient will be NPO as of midnight for thrombectomy in the morning.

## 2021-12-31 NOTE — Progress Notes (Signed)
PROGRESS NOTE    Leah Roberts  QJJ:[id number] DOB: 03/14/1951 DOA: 12/30/2021 PCP: Allegra Grana, FNP   Brief Narrative:  71 y.o. female with medical history significant for DVT in 2013 following a 16-hour plane flight followed by a PE in 2014, currently on Eliquis and who underwent left total knee replacement on 12/09/2021 who presents to the ED with a 2-day history of sharp left-sided chest pain and shortness of breath, worse with exertion.  She reports having missed a dose or 2 of Eliquis a few days prior otherwise has been compliant.  She has been on Eliquis for the past 2 years and previously was on Coumadin.  CTA chest was positive for acute PE with CT evidence of right heart strain consistent with at least submassive PE, RV LV ratio 1.5. She was started on Heparin Drip.    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 Sueanne Margarita.  She has been compliant with Eliquis except when it was held for left knee total arthroplasty on 8/9.  This is not considered failure of Eliquis.  Discussed with Dr. Orlie Dakin who is in agreement with the plan - Echocardiogram - Lower extremity Dopplers-negative - Continue heparin drip, eventually transition back to Eliquis  Acute respiratory failure with hypoxia (HCC) Psych due to pulmonary embolism, supplemental oxygen.  Essential hypertension -Not on home meds  GERD -Not on home meds   DVT prophylaxis: Subcu heparin Code Status: Full  code Family Communication:    Status is: Inpatient Remains inpatient appropriate because: Maintain hospital stay for management of submassive PE     Subjective: Seen and examined at bedside, tells me she feels little better compared to yesterday.  She was able to ambulate in the room without overt shortness of breath.   Examination:  General exam: Appears calm and comfortable  Respiratory system: Clear to auscultation. Respiratory effort normal. Cardiovascular system: S1 & S2 heard, RRR. No JVD, murmurs, rubs, gallops or clicks. No pedal edema. Gastrointestinal system: Abdomen is nondistended, soft and nontender. No organomegaly or masses felt. Normal bowel sounds heard. Central nervous system: Alert and oriented. No focal neurological deficits. Extremities: Symmetric 5 x 5 power. Skin: No rashes, lesions or ulcers Psychiatry: Judgement and insight appear normal. Mood & affect appropriate.     Objective: Vitals:   12/31/21 0430 12/31/21 0500 12/31/21 0530 12/31/21 0741  BP: 123/80 117/78 105/73   Pulse: 72 91 88   Resp: 17 14 19    Temp:    98 F (36.7 C)  TempSrc:    Oral  SpO2: 96% 92% 95%   Weight:      Height:       No intake or output data in the 24 hours ending 12/31/21 0819 Filed Weights   12/30/21 1822  Weight: 90.7 kg     Data Reviewed:   CBC: Recent Labs  Lab 12/30/21 1847  WBC 8.0  HGB 11.2*  HCT 35.8*  MCV 83.8  PLT 275   Basic Metabolic  Panel: Recent Labs  Lab 12/30/21 1847  NA 139  K 4.0  CL 110  CO2 19*  GLUCOSE 121*  BUN 13  CREATININE 0.87  CALCIUM 9.4   GFR: Estimated Creatinine Clearance: 69.3 mL/min (by C-G formula based on SCr of 0.87 mg/dL). Liver Function Tests: Recent Labs  Lab 12/30/21 1847  AST 21  ALT 10  ALKPHOS 67  BILITOT 0.7  PROT 7.5  ALBUMIN 3.7   No results for input(s): "LIPASE", "AMYLASE" in the last 168 hours. No results for input(s): "AMMONIA" in the last 168 hours. Coagulation Profile: Recent  Labs  Lab 12/31/21 0040  INR 1.5*   Cardiac Enzymes: No results for input(s): "CKTOTAL", "CKMB", "CKMBINDEX", "TROPONINI" in the last 168 hours. BNP (last 3 results) No results for input(s): "PROBNP" in the last 8760 hours. HbA1C: No results for input(s): "HGBA1C" in the last 72 hours. CBG: No results for input(s): "GLUCAP" in the last 168 hours. Lipid Profile: No results for input(s): "CHOL", "HDL", "LDLCALC", "TRIG", "CHOLHDL", "LDLDIRECT" in the last 72 hours. Thyroid Function Tests: No results for input(s): "TSH", "T4TOTAL", "FREET4", "T3FREE", "THYROIDAB" in the last 72 hours. Anemia Panel: No results for input(s): "VITAMINB12", "FOLATE", "FERRITIN", "TIBC", "IRON", "RETICCTPCT" in the last 72 hours. Sepsis Labs: No results for input(s): "PROCALCITON", "LATICACIDVEN" in the last 168 hours.  Recent Results (from the past 240 hour(s))  Resp Panel by RT-PCR (Flu A&B, Covid) Anterior Nasal Swab     Status: None   Collection Time: 12/30/21 11:12 PM   Specimen: Anterior Nasal Swab  Result Value Ref Range Status   SARS Coronavirus 2 by RT PCR NEGATIVE NEGATIVE Final    Comment: (NOTE) SARS-CoV-2 target nucleic acids are NOT DETECTED.  The SARS-CoV-2 RNA is generally detectable in upper respiratory specimens during the acute phase of infection. The lowest concentration of SARS-CoV-2 viral copies this assay can detect is 138 copies/mL. A negative result does not preclude SARS-Cov-2 infection and should not be used as the sole basis for treatment or other patient management decisions. A negative result may occur with  improper specimen collection/handling, submission of specimen other than nasopharyngeal swab, presence of viral mutation(s) within the areas targeted by this assay, and inadequate number of viral copies(<138 copies/mL). A negative result must be combined with clinical observations, patient history, and epidemiological information. The expected result is  Negative.  Fact Sheet for Patients:  BloggerCourse.com  Fact Sheet for Healthcare Providers:  SeriousBroker.it  This test is no t yet approved or cleared by the [place] FDA and  has been authorized for detection and/or diagnosis of SARS-CoV-2 by FDA under an Emergency Use Authorization (EUA). This EUA will remain  in effect (meaning this test can be used) for the duration of the COVID-19 declaration under Section 564(b)(1) of the Act, 21 U.S.C.section 360bbb-3(b)(1), unless the authorization is terminated  or revoked sooner.       Influenza A by PCR NEGATIVE NEGATIVE Final   Influenza B by PCR NEGATIVE NEGATIVE Final    Comment: (NOTE) The Xpert Xpress SARS-CoV-2/FLU/RSV plus assay is intended as an aid in the diagnosis of influenza from Nasopharyngeal swab specimens and should not be used as a sole basis for treatment. Nasal washings and aspirates are unacceptable for Xpert Xpress SARS-CoV-2/FLU/RSV testing.  Fact Sheet for Patients: BloggerCourse.com  Fact Sheet for Healthcare Providers: SeriousBroker.it  This test is not yet approved or cleared by the [place] FDA and has been authorized for detection and/or diagnosis of SARS-CoV-2 by FDA under  an Emergency Use Authorization (EUA). This EUA will remain in effect (meaning this test can be used) for the duration of the COVID-19 declaration under Section 564(b)(1) of the Act, 21 U.S.C. section 360bbb-3(b)(1), unless the authorization is terminated or revoked.  Performed at [facility], [address] [place], [place] [address]          Radiology Studies: US Venous Img Lower Bilateral  Result Date: 12/31/2021 CLINICAL DATA:  Recent left knee replacement presenting with chest pain and shortness of breath. EXAM: BILATERAL LOWER EXTREMITY VENOUS DOPPLER ULTRASOUND TECHNIQUE: Gray-scale sonography  with graded compression, as well as color Doppler and duplex ultrasound were performed to evaluate the lower extremity deep venous systems from the level of the common femoral vein and including the common femoral, femoral, profunda femoral, popliteal and calf veins including the posterior tibial, peroneal and gastrocnemius veins when visible. The superficial great saphenous vein was also interrogated. Spectral Doppler was utilized to evaluate flow at rest and with distal augmentation maneuvers in the common femoral, femoral and popliteal veins. COMPARISON:  None Available. FINDINGS: RIGHT LOWER EXTREMITY Common Femoral Vein: No evidence of thrombus. Normal compressibility, respiratory phasicity and response to augmentation. Saphenofemoral Junction: No evidence of thrombus. Normal compressibility and flow on color Doppler imaging. Profunda Femoral Vein: No evidence of thrombus. Normal compressibility and flow on color Doppler imaging. Femoral Vein: No evidence of thrombus. Normal compressibility, respiratory phasicity and response to augmentation. Popliteal Vein: No evidence of thrombus. Normal compressibility, respiratory phasicity and response to augmentation. Calf Veins: No evidence of thrombus. Normal compressibility and flow on color Doppler imaging. Superficial Great Saphenous Vein: No evidence of thrombus. Normal compressibility. Venous Reflux:  None. Other Findings: A 2.7 cm x 0.6 cm x 2.5 cm anechoic structure is seen within the soft tissues of the right popliteal fossa. No abnormal flow is noted within this region on color Doppler evaluation. LEFT LOWER EXTREMITY Common Femoral Vein: No evidence of thrombus. Normal compressibility, respiratory phasicity and response to augmentation. Saphenofemoral Junction: No evidence of thrombus. Normal compressibility and flow on color Doppler imaging. Profunda Femoral Vein: No evidence of thrombus. Normal compressibility and flow on color Doppler imaging. Femoral Vein:  No evidence of thrombus. Normal compressibility, respiratory phasicity and response to augmentation. Popliteal Vein: No evidence of thrombus. Normal compressibility, respiratory phasicity and response to augmentation. Calf Veins: No evidence of thrombus. Normal compressibility and flow on color Doppler imaging. Superficial Great Saphenous Vein: No evidence of thrombus. Normal compressibility. Venous Reflux:  None. Other Findings: A 3.9 cm x 1.1 cm x 2.7 cm hypoechoic structure is seen within the soft tissues of the left popliteal fossa. No abnormal flow is noted within this region on color Doppler evaluation. IMPRESSION: 1. No evidence of deep venous thrombosis within the RIGHT lower extremity or LEFT lower extremity. 2. Findings consistent with BILATERAL Baker's cysts. Electronically Signed   By: Aram Candela M.D.   On: 12/31/2021 01:29   CT Angio Chest PE W and/or Wo Contrast  Result Date: 12/31/2021 CLINICAL DATA:  Concern for pulmonary embolism. EXAM: CT ANGIOGRAPHY CHEST WITH CONTRAST TECHNIQUE: Multidetector CT imaging of the chest was performed using the standard protocol during bolus administration of intravenous contrast. Multiplanar CT image reconstructions and MIPs were obtained to evaluate the vascular anatomy. RADIATION DOSE REDUCTION: This exam was performed according to the departmental dose-optimization program which includes automated exposure control, adjustment of the mA and/or kV according to patient size and/or use of iterative reconstruction technique. CONTRAST:  65mL OMNIPAQUE  IOHEXOL 350 MG/ML SOLN COMPARISON:  Chest radiograph dated 12/30/2021. CT dated 01/01/2020. FINDINGS: Cardiovascular: Top-normal cardiac size. No pericardial effusion. There is retrograde flow of contrast from the right atrium into the IVC suggestive of right heart dysfunction. There is enlargement of the right heart chambers with RV/LV ratio of 1.5 consistent with right heart straining. Mild atherosclerotic  calcification of the aortic arch. No aneurysmal dilatation or dissection. The origins of the great vessels of the aortic arch appear patent. There is dilatation of the pulmonary arteries suggestive of pulmonary hypertension. Moderate-sized bilateral pulmonary artery emboli involving the lobar pulmonary artery in the lingula and right middle lobe as well as additional segmental emboli bilaterally. Mediastinum/Nodes: No hilar or mediastinal adenopathy. The esophagus and the thyroid gland are grossly unremarkable. No mediastinal fluid collection. Lungs/Pleura: No focal consolidation, pleural effusion, or pneumothorax. The central airways are patent. Upper Abdomen: No acute abnormality. Musculoskeletal: Mild degenerative changes. No acute osseous pathology. Review of the MIP images confirms the above findings. IMPRESSION: 1. Positive for acute PE with CT evidence of right heart strain (RV/LV Ratio = 1.5) consistent with at least submassive (intermediate risk) PE. The presence of right heart strain has been associated with an increased risk of morbidity and mortality. Please refer to the "Code PE Focused" order set in EPIC. 2. Aortic Atherosclerosis (ICD10-I70.0). Electronically Signed   By: Elgie Collard M.D.   On: 12/31/2021 00:13   DG Chest Port 1 View  Result Date: 12/30/2021 CLINICAL DATA:  Shortness of breath EXAM: PORTABLE CHEST 1 VIEW COMPARISON:  12/31/2019 FINDINGS: Cardiomegaly with mild central congestion. No acute airspace disease, pleural effusion or pneumothorax. Central pulmonary vessels appear slightly enlarged. IMPRESSION: 1. Cardiomegaly with mild central congestion 2. Prominent central pulmonary vessels, question due to pulmonary arterial hypertension. Electronically Signed   By: Jasmine Pang M.D.   On: 12/30/2021 19:09        Scheduled Meds: Continuous Infusions:  sodium chloride 125 mL/hr at 12/30/21 2309   heparin 950 Units/hr (12/31/21 0045)     LOS: 0 days   Time spent= 35  mins    Jacinda Kanady Joline Maxcy, MD [facility]  If 7PM-7AM, please contact night-coverage  12/31/2021, 8:19 AM

## 2021-12-31 NOTE — Progress Notes (Signed)
ANTICOAGULATION CONSULT NOTE  Pharmacy Consult for Heparin infusion Indication: acute submassive PE  Patient Measurements: Height: 5\' 6"  (167.6 cm) Weight: 90.7 kg (199 lb 15.3 oz) IBW/kg (Calculated) : 59.3 Heparin Dosing Weight: 79.1 kg   Labs: Recent Labs    12/30/21 1847 12/30/21 2313 12/31/21 0040 12/31/21 0040 12/31/21 0838 12/31/21 1700 12/31/21 2309  HGB 11.2*  --   --   --  10.5*  --   --   HCT 35.8*  --   --   --  34.1*  --   --   PLT 275  --   --   --  253  --   --   APTT  --   --  32   < > 58* 84* 71*  LABPROT  --   --  18.2*  --   --   --   --   INR  --   --  1.5*  --   --   --   --   HEPARINUNFRC  --   --  >1.10*  --  >1.10*  --   --   CREATININE 0.87  --   --   --   --   --   --   TROPONINIHS 271* 275* 267*  --   --   --   --    < > = values in this interval not displayed.     Estimated Creatinine Clearance: 69.3 mL/min (by C-G formula based on SCr of 0.87 mg/dL).  Medical History: Past Medical History:  Diagnosis Date   Arthritis    DVT (deep venous thrombosis) (HCC) 2013   Dyspnea    due to knee pain   Family history of adverse reaction to anesthesia    Brother and sister hard to wake up.   GERD (gastroesophageal reflux disease)    History of blood transfusion    History of hypertension    Hypertension    Peripheral vascular disease (HCC)    DVT   Pre-diabetes    Pulmonary embolism [facility]) 2013   Assessment: Leah Roberts is a 71 year old female admitted with PE. PMH significant for history of DVT (2/2 16 hr flight in 2014, on eliquis), arthritis, GERD, HTN, PVD, pre-diabetes. Patient on Eliquis 5 mg PO BID with last dose taken in ED lobby 8/30 @ ~2300. 8/30 CTA chest positve for acute PE with CT evidence of right heart strain (RV/LV Ratio = 1.5) consistent with at least submassive (intermediate risk) PE. 8/31 venous 9/31 negative for DVT in either lower extremity. Pharmacy has been consulted to initiate and manage heparin  infusion.  Baseline Labs: aPTT 32, HL >1.10, PT 18.2, INR 1.5, Hgb 11.2, Hct 35.7, Plt 275  Goal of Therapy:  Heparin level 0.3-0.7 units/ml aPTT 66 - 102  seconds Monitor platelets by anticoagulation protocol: Yes  Monitoring  Date Time HL/aPTT Rate/Comment  8/31 0838 >1.10/58 SUBtherapeutic 8/31 1700 N/A / 84 Therapeutic x 1 8/31     2309         71              Therapeutic X 2    Plan:  8/31:  aPTT @ 2309 = 71, therapeutic X 2 Will continue pt on current rate and recheck aPTT and HL on 9/1 with AM labs.   Thank you for allowing pharmacy to be a part of this patient's care.  Tylon Kemmerling D  12/31/2021 11:38 PM

## 2021-12-31 NOTE — Progress Notes (Signed)
ANTICOAGULATION CONSULT NOTE - Initial Consult  Pharmacy Consult for Heparin infusion Indication: acute submassive PE  Allergies  Allergen Reactions   Sulfa Antibiotics Swelling and Rash    Patient Measurements: Height: 5\' 6"  (167.6 cm) Weight: 90.7 kg (199 lb 15.3 oz) IBW/kg (Calculated) : 59.3 Heparin Dosing Weight: 79.1 kg   Vital Signs: BP: 105/73 (08/31 0530) Pulse Rate: 88 (08/31 0530)  Labs: Recent Labs    12/30/21 1847 12/30/21 2313 12/31/21 0040  HGB 11.2*  --   --   HCT 35.8*  --   --   PLT 275  --   --   APTT  --   --  32  LABPROT  --   --  18.2*  INR  --   --  1.5*  HEPARINUNFRC  --   --  >1.10*  CREATININE 0.87  --   --   TROPONINIHS 271* 275* 267*     Estimated Creatinine Clearance: 69.3 mL/min (by C-G formula based on SCr of 0.87 mg/dL).   Medical History: Past Medical History:  Diagnosis Date   Arthritis    DVT (deep venous thrombosis) (HCC) 2013   Dyspnea    due to knee pain   Family history of adverse reaction to anesthesia    Brother and sister hard to wake up.   GERD (gastroesophageal reflux disease)    History of blood transfusion    History of hypertension    Hypertension    Peripheral vascular disease (HCC)    DVT   Pre-diabetes    Pulmonary embolism (HCC) 2013    Medications:  Scheduled:  Infusions:   sodium chloride 125 mL/hr at 12/30/21 2309   heparin 950 Units/hr (12/31/21 0045)   PRN: acetaminophen **OR** acetaminophen, morphine injection, ondansetron **OR** ondansetron (ZOFRAN) IV  Assessment: Leah Roberts is a 71 year old female admitted with PE. PMH significant for history of DVT (2/2 16 hr flight in 2014, on eliquis), arthritis, GERD, HTN, PVD, pre-diabetes. Patient on Eliquis 5 mg PO BID with last dose taken in ED lobby 8/30 @ ~2300. 8/30 CTA chest positve for acute PE with CT evidence of right heart strain (RV/LV Ratio = 1.5) consistent with at least submassive (intermediate risk) PE. 8/31 venous 9/31 negative  for DVT in either lower extremity. Pharmacy has been consulted to initiate and manage heparin infusion.  Baseline Labs: aPTT 32, HL >1.10, PT 18.2, INR 1.5, Hgb 11.2, Hct 35.7, Plt 275  Goal of Therapy:  Heparin level 0.3-0.7 units/ml aPTT 66 - 102  seconds Monitor platelets by anticoagulation protocol: Yes  Monitoring  Date Time HL/aPTT Rate/Comment  8/30 0838 >1.10/58 aPTT SUBtherapeutic   Plan:  Give heparin bolus of 1200 units then increase heparin drip to 1100 units/hr.  Check aPTT in 6 hours. Continue to monitor aPTT until HL and aPTT correlate.  Check HL daily for correlation until HL and aPTT correlate. Switch to HL monitoring once HL and aPTT correlate. Monitor CBC daily while on heparin infusion  Thank you for allowing pharmacy to be a part of this patient's care.  02-19-2000, PharmD PGY1 Pharmacy Resident 12/31/2021 7:21 AM

## 2021-12-31 NOTE — Consult Note (Signed)
[facility] Vascular Consult Note  MRN : [id number]  Leah Roberts is a 71 y.o. (1950/09/14) female who presents with chief complaint of  Chief Complaint  Patient presents with   Shortness of Breath  .   Consulting Physician: Lindajo Royal, MD Reason for consult: Pulmonary embolism History of Present Illness: The patient is a 71 year old female that presents to [facility] after having 2 days of sharp left-sided chest pain and shortness of breath.  The patient has a previous history of pulmonary embolism in 2014 as well as in 2013.  She also recently underwent a left knee replacement about 3 weeks ago.  She notes that she is generally compliant with her Eliquis however she notes that she may have missed a dose or 2 over the last few years.  Initially the patient had some hypoxia requiring 2 L nasal cannula however currently she is at rest with room air with O2 sats in the 90s.  The patient underwent a CT scan which showed bilateral pulmonary embolism with evidence of right heart strain.  There is no DVT noted bilaterally however there is evidence of Baker's cyst laterally.  Current Facility-Administered Medications  Medication Dose Route Frequency Provider Last Rate Last Admin   0.9 %  sodium chloride infusion   Intravenous Continuous Ward, Layla Maw, DO 125 mL/hr at 12/30/21 2309 New Bag at 12/30/21 2309   acetaminophen (TYLENOL) tablet 650 mg  650 mg Oral Q6H PRN Andris Baumann, MD       Or   acetaminophen (TYLENOL) suppository 650 mg  650 mg Rectal Q6H PRN Andris Baumann, MD       docusate sodium (COLACE) capsule 100 mg  100 mg Oral BID Amin, Ankit Chirag, MD   100 mg at 12/31/21 1108   guaiFENesin (ROBITUSSIN) 100 MG/5ML liquid 5 mL  5 mL Oral Q4H PRN Amin, Ankit Chirag, MD       heparin ADULT infusion 100 units/mL (25000 units/222mL)  1,100 Units/hr Intravenous Continuous Celene Squibb, RPH 11 mL/hr at 12/31/21 1108 1,100 Units/hr  at 12/31/21 1108   hydrALAZINE (APRESOLINE) injection 10 mg  10 mg Intravenous Q4H PRN Amin, Ankit Chirag, MD       ipratropium-albuterol (DUONEB) 0.5-2.5 (3) MG/3ML nebulizer solution 3 mL  3 mL Nebulization Q4H PRN Amin, Ankit Chirag, MD       metoprolol tartrate (LOPRESSOR) injection 5 mg  5 mg Intravenous Q4H PRN Amin, Ankit Chirag, MD       morphine (PF) 2 MG/ML injection 2 mg  2 mg Intravenous Q2H PRN Andris Baumann, MD   2 mg at 12/31/21 0746   ondansetron (ZOFRAN) tablet 4 mg  4 mg Oral Q6H PRN Andris Baumann, MD       Or   ondansetron [facility]) injection 4 mg  4 mg Intravenous Q6H PRN Andris Baumann, MD       oxyCODONE (Oxy IR/ROXICODONE) immediate release tablet 5 mg  5 mg Oral Q4H PRN Amin, Ankit Chirag, MD       senna-docusate (Senokot-S) tablet 1 tablet  1 tablet Oral QHS PRN Amin, Ankit Chirag, MD       traZODone (DESYREL) tablet 50 mg  50 mg Oral QHS PRN Amin, Loura Halt, MD        Past Medical History:  Diagnosis Date   Arthritis    DVT (deep venous thrombosis) (HCC) 2013   Dyspnea    due to knee pain   Family  history of adverse reaction to anesthesia    Brother and sister hard to wake up.   GERD (gastroesophageal reflux disease)    History of blood transfusion    History of hypertension    Hypertension    Peripheral vascular disease (HCC)    DVT   Pre-diabetes    Pulmonary embolism (HCC) 2013    Past Surgical History:  Procedure Laterality Date   APPENDECTOMY     TONSILLECTOMY AND ADENOIDECTOMY     TOTAL KNEE ARTHROPLASTY Left 12/09/2021   Procedure: LEFT TOTAL KNEE ARTHROPLASTY;  Surgeon: Nadara Mustard, MD;  Location: [facility] OR;  Service: Orthopedics;  Laterality: Left;    Social History Social History   Tobacco Use   Smoking status: Never   Smokeless tobacco: Never  Substance Use Topics   Alcohol use: Yes    Comment: Ocassional glass of wine   Drug use: No    Family History Family History  Problem Relation Age of Onset   Hypertension Father     Heart disease Father 73   Thyroid disease Sister    Allergies Sister    CVA Neg Hx    Heart attack Neg Hx    High Cholesterol Neg Hx    Colon cancer Neg Hx     Allergies  Allergen Reactions   Sulfa Antibiotics Swelling and Rash     REVIEW OF SYSTEMS (Negative unless checked)  Constitutional: [] Weight loss  [] Fever  [] Chills Cardiac: [] Chest pain   [] Chest pressure   [] Palpitations   [] Shortness of breath when laying flat   [] Shortness of breath at rest   [x] Shortness of breath with exertion. Vascular:  [] Pain in legs with walking   [] Pain in legs at rest   [] Pain in legs when laying flat   [] Claudication   [] Pain in feet when walking  [] Pain in feet at rest  [] Pain in feet when laying flat   [] History of DVT   [] Phlebitis   [x] Swelling in legs   [] Varicose veins   [] Non-healing ulcers Pulmonary:   [] Uses home oxygen   [] Productive cough   [] Hemoptysis   [] Wheeze  [] COPD   [] Asthma Neurologic:  [] Dizziness  [] Blackouts   [] Seizures   [] History of stroke   [] History of TIA  [] Aphasia   [] Temporary blindness   [] Dysphagia   [] Weakness or numbness in arms   [] Weakness or numbness in legs Musculoskeletal:  [] Arthritis   [] Joint swelling   [] Joint pain   [] Low back pain Hematologic:  [] Easy bruising  [] Easy bleeding   [] Hypercoagulable state   [] Anemic  [] Hepatitis Gastrointestinal:  [] Blood in stool   [] Vomiting blood  [] Gastroesophageal reflux/heartburn   [] Difficulty swallowing. Genitourinary:  [] Chronic kidney disease   [] Difficult urination  [] Frequent urination  [] Burning with urination   [] Blood in urine Skin:  [] Rashes   [] Ulcers   [] Wounds Psychological:  [] History of anxiety   []  History of major depression.  Physical Examination  Vitals:   12/31/21 1700 12/31/21 1801 12/31/21 1900 12/31/21 2130  BP:   122/75 117/89  Pulse:   78 77  Resp:   20 20  Temp: 98.1 F (36.7 C) 98.1 F (36.7 C) 99 F (37.2 C) 98.2 F (36.8 C)  TempSrc: Oral   Oral  SpO2:   98% 100%  Weight:       Height:       Body mass index is 32.27 kg/m. Gen:  WD/WN, NAD Head: Ada/AT, No temporalis wasting. Prominent temp pulse not noted. Ear/Nose/Throat: Hearing  grossly intact, nares w/o erythema or drainage, oropharynx w/o Erythema/Exudate Eyes: Sclera non-icteric, conjunctiva clear Neck: Trachea midline.  No JVD.  Pulmonary:  Good air movement, respirations not labored, equal bilaterally at rest, Cardiac: RRR, normal S1, S2. Vascular: 1+ edema bilaterally Vessel Right Left  Radial Palpable Palpable  PT Palpable Palpable  DP Palpable Palpable   Gastrointestinal: soft, non-tender/non-distended. No guarding/reflex.  Musculoskeletal: M/S 5/5 throughout.  Extremities without ischemic changes.  No deformity or atrophy Neurologic: Sensation grossly intact in extremities.  Symmetrical.  Speech is fluent. Motor exam as listed above. Psychiatric: Judgment intact, Mood & affect appropriate for pt's clinical situation. Dermatologic: No rashes or ulcers noted.  No cellulitis or open wounds. Lymph : No Cervical, Axillary, or Inguinal lymphadenopathy.    CBC Lab Results  Component Value Date   WBC 6.4 12/31/2021   HGB 10.5 (L) 12/31/2021   HCT 34.1 (L) 12/31/2021   MCV 85.5 12/31/2021   PLT 253 12/31/2021    BMET    Component Value Date/Time   NA 139 12/30/2021 1847   K 4.0 12/30/2021 1847   CL 110 12/30/2021 1847   CO2 19 (L) 12/30/2021 1847   GLUCOSE 121 (H) 12/30/2021 1847   BUN 13 12/30/2021 1847   CREATININE 0.87 12/30/2021 1847   CALCIUM 9.4 12/30/2021 1847   GFRNONAA >60 12/30/2021 1847   GFRAA >60 01/01/2020 0236   Estimated Creatinine Clearance: 69.3 mL/min (by C-G formula based on SCr of 0.87 mg/dL).  COAG Lab Results  Component Value Date   INR 1.5 (H) 12/31/2021   INR 1.5 (H) 06/18/2016    Radiology US Venous Img Lower Bilateral  Result Date: 12/31/2021 CLINICAL DATA:  Recent left knee replacement presenting with chest pain and shortness of breath. EXAM:  BILATERAL LOWER EXTREMITY VENOUS DOPPLER ULTRASOUND TECHNIQUE: Gray-scale sonography with graded compression, as well as color Doppler and duplex ultrasound were performed to evaluate the lower extremity deep venous systems from the level of the common femoral vein and including the common femoral, femoral, profunda femoral, popliteal and calf veins including the posterior tibial, peroneal and gastrocnemius veins when visible. The superficial great saphenous vein was also interrogated. Spectral Doppler was utilized to evaluate flow at rest and with distal augmentation maneuvers in the common femoral, femoral and popliteal veins. COMPARISON:  None Available. FINDINGS: RIGHT LOWER EXTREMITY Common Femoral Vein: No evidence of thrombus. Normal compressibility, respiratory phasicity and response to augmentation. Saphenofemoral Junction: No evidence of thrombus. Normal compressibility and flow on color Doppler imaging. Profunda Femoral Vein: No evidence of thrombus. Normal compressibility and flow on color Doppler imaging. Femoral Vein: No evidence of thrombus. Normal compressibility, respiratory phasicity and response to augmentation. Popliteal Vein: No evidence of thrombus. Normal compressibility, respiratory phasicity and response to augmentation. Calf Veins: No evidence of thrombus. Normal compressibility and flow on color Doppler imaging. Superficial Great Saphenous Vein: No evidence of thrombus. Normal compressibility. Venous Reflux:  None. Other Findings: A 2.7 cm x 0.6 cm x 2.5 cm anechoic structure is seen within the soft tissues of the right popliteal fossa. No abnormal flow is noted within this region on color Doppler evaluation. LEFT LOWER EXTREMITY Common Femoral Vein: No evidence of thrombus. Normal compressibility, respiratory phasicity and response to augmentation. Saphenofemoral Junction: No evidence of thrombus. Normal compressibility and flow on color Doppler imaging. Profunda Femoral Vein: No evidence  of thrombus. Normal compressibility and flow on color Doppler imaging. Femoral Vein: No evidence of thrombus. Normal compressibility, respiratory phasicity and response to augmentation. Popliteal  Vein: No evidence of thrombus. Normal compressibility, respiratory phasicity and response to augmentation. Calf Veins: No evidence of thrombus. Normal compressibility and flow on color Doppler imaging. Superficial Great Saphenous Vein: No evidence of thrombus. Normal compressibility. Venous Reflux:  None. Other Findings: A 3.9 cm x 1.1 cm x 2.7 cm hypoechoic structure is seen within the soft tissues of the left popliteal fossa. No abnormal flow is noted within this region on color Doppler evaluation. IMPRESSION: 1. No evidence of deep venous thrombosis within the RIGHT lower extremity or LEFT lower extremity. 2. Findings consistent with BILATERAL Baker's cysts. Electronically Signed   By: Aram Candelahaddeus  Houston M.D.   On: 12/31/2021 01:29   CT Angio Chest PE W and/or Wo Contrast  Result Date: 12/31/2021 CLINICAL DATA:  Concern for pulmonary embolism. EXAM: CT ANGIOGRAPHY CHEST WITH CONTRAST TECHNIQUE: Multidetector CT imaging of the chest was performed using the standard protocol during bolus administration of intravenous contrast. Multiplanar CT image reconstructions and MIPs were obtained to evaluate the vascular anatomy. RADIATION DOSE REDUCTION: This exam was performed according to the departmental dose-optimization program which includes automated exposure control, adjustment of the mA and/or kV according to patient size and/or use of iterative reconstruction technique. CONTRAST:  75mL OMNIPAQUE IOHEXOL 350 MG/ML SOLN COMPARISON:  Chest radiograph dated 12/30/2021. CT dated 01/01/2020. FINDINGS: Cardiovascular: Top-normal cardiac size. No pericardial effusion. There is retrograde flow of contrast from the right atrium into the IVC suggestive of right heart dysfunction. There is enlargement of the right heart chambers  with RV/LV ratio of 1.5 consistent with right heart straining. Mild atherosclerotic calcification of the aortic arch. No aneurysmal dilatation or dissection. The origins of the great vessels of the aortic arch appear patent. There is dilatation of the pulmonary arteries suggestive of pulmonary hypertension. Moderate-sized bilateral pulmonary artery emboli involving the lobar pulmonary artery in the lingula and right middle lobe as well as additional segmental emboli bilaterally. Mediastinum/Nodes: No hilar or mediastinal adenopathy. The esophagus and the thyroid gland are grossly unremarkable. No mediastinal fluid collection. Lungs/Pleura: No focal consolidation, pleural effusion, or pneumothorax. The central airways are patent. Upper Abdomen: No acute abnormality. Musculoskeletal: Mild degenerative changes. No acute osseous pathology. Review of the MIP images confirms the above findings. IMPRESSION: 1. Positive for acute PE with CT evidence of right heart strain (RV/LV Ratio = 1.5) consistent with at least submassive (intermediate risk) PE. The presence of right heart strain has been associated with an increased risk of morbidity and mortality. Please refer to the "Code PE Focused" order set in EPIC. 2. Aortic Atherosclerosis (ICD10-I70.0). Electronically Signed   By: Elgie CollardArash  Radparvar M.D.   On: 12/31/2021 00:13   DG Chest Port 1 View  Result Date: 12/30/2021 CLINICAL DATA:  Shortness of breath EXAM: PORTABLE CHEST 1 VIEW COMPARISON:  12/31/2019 FINDINGS: Cardiomegaly with mild central congestion. No acute airspace disease, pleural effusion or pneumothorax. Central pulmonary vessels appear slightly enlarged. IMPRESSION: 1. Cardiomegaly with mild central congestion 2. Prominent central pulmonary vessels, question due to pulmonary arterial hypertension. Electronically Signed   By: Jasmine PangKim  Fujinaga M.D.   On: 12/30/2021 19:09   XR Knee 1-2 Views Left  Result Date: 12/23/2021 Radiographs of the left knee show  stable alignment total knee arthroplasty hardware.  No sign of loosening.     Assessment/Plan 1.  Pulmonary embolism  Currently the patient is a submassive bilateral pulmonary embolism with right heart strain.  Given the patient's recurrent pulmonary embolisms, the risk for scarring and long-term consequences is increased.  Based on this the patient would be a candidate for pulmonary thrombectomy.  I have discussed the procedure with the patient as well as the risk benefits and alternatives and she is agreeable to proceed.  Based on patient's description of timing with pulmonary embolism this is likely not a failure of her Eliquis.  Patient is recommended to continue with Eliquis at discharge 2.  Essential hypertension Currently not on medications outpatient, no recommendations for addition.   Family Communication: None present at bedside however contacted the patient's son and daughter-in-law by phone as patient requested.  Was able to discuss procedure as well as risk, benefits and alternatives with the patient's son  Thank you for allowing [place] to participate in the care of this patient.   Georgiana Spinner, NP [facility] [phone] (Office Phone) [phone] (Office Fax) [phone] (Pager)  12/31/2021 9:46 PM  Staff may message me via secure chat in Epic  but this may not receive immediate response,  please page for urgent matters!  Dictation software was used to generate the above note. Typos may occur and escape review, as with typed/written notes. Any error is purely unintentional.  Please contact me directly for clarity if needed.

## 2021-12-31 NOTE — ED Notes (Signed)
US tech at bedside

## 2021-12-31 NOTE — Progress Notes (Signed)
ANTICOAGULATION CONSULT NOTE  Pharmacy Consult for Heparin infusion Indication: acute submassive PE  Patient Measurements: Height: 5\' 6"  (167.6 cm) Weight: 90.7 kg (199 lb 15.3 oz) IBW/kg (Calculated) : 59.3 Heparin Dosing Weight: 79.1 kg   Labs: Recent Labs    12/30/21 1847 12/30/21 2313 12/31/21 0040 12/31/21 0838 12/31/21 1700  HGB 11.2*  --   --  10.5*  --   HCT 35.8*  --   --  34.1*  --   PLT 275  --   --  253  --   APTT  --   --  32 58* 84*  LABPROT  --   --  18.2*  --   --   INR  --   --  1.5*  --   --   HEPARINUNFRC  --   --  >1.10* >1.10*  --   CREATININE 0.87  --   --   --   --   TROPONINIHS 271* 275* 267*  --   --      Estimated Creatinine Clearance: 69.3 mL/min (by C-G formula based on SCr of 0.87 mg/dL).  Medical History: Past Medical History:  Diagnosis Date   Arthritis    DVT (deep venous thrombosis) (HCC) 2013   Dyspnea    due to knee pain   Family history of adverse reaction to anesthesia    Brother and sister hard to wake up.   GERD (gastroesophageal reflux disease)    History of blood transfusion    History of hypertension    Hypertension    Peripheral vascular disease (HCC)    DVT   Pre-diabetes    Pulmonary embolism [facility]) 2013   Assessment: Leah Roberts is a 71 year old female admitted with PE. PMH significant for history of DVT (2/2 16 hr flight in 2014, on eliquis), arthritis, GERD, HTN, PVD, pre-diabetes. Patient on Eliquis 5 mg PO BID with last dose taken in ED lobby 8/30 @ ~2300. 8/30 CTA chest positve for acute PE with CT evidence of right heart strain (RV/LV Ratio = 1.5) consistent with at least submassive (intermediate risk) PE. 8/31 venous 9/31 negative for DVT in either lower extremity. Pharmacy has been consulted to initiate and manage heparin infusion.  Baseline Labs: aPTT 32, HL >1.10, PT 18.2, INR 1.5, Hgb 11.2, Hct 35.7, Plt 275  Goal of Therapy:  Heparin level 0.3-0.7 units/ml aPTT 66 - 102  seconds Monitor platelets  by anticoagulation protocol: Yes  Monitoring  Date Time HL/aPTT Rate/Comment  8/31 0838 >1.10/58 SUBtherapeutic 8/31 1700 N/A / 84 Therapeutic x 1   Plan:  --aPTT is therapeutic x 1 --Continue heparin infusion at 1100 units/hr --Re-check confirmatory aPTT in 6 hours Switch to HL monitoring once HL and aPTT correlate --Daily CBC per protocol while on IV heparin   Thank you for allowing pharmacy to be a part of this patient's care.  9/31  12/31/2021 7:57 PM

## 2021-12-31 NOTE — Progress Notes (Signed)
Patient is NPO at midninght

## 2021-12-31 NOTE — Assessment & Plan Note (Signed)
O2 sat was 87% in the ED patient was tachypneic, requiring 2 L to maintain sats at 100% Continue supplemental O2 via nasal cannula and wean as tolerated

## 2022-01-01 ENCOUNTER — Encounter
Admission: EM | Disposition: A | Discharge status: Home / Self Care | Payer: Self-pay | Source: Home / Self Care | Attending: Internal Medicine

## 2022-01-01 ENCOUNTER — Inpatient Hospital Stay (HOSPITAL_COMMUNITY)
Admit: 2022-01-01 | Discharge: 2022-01-01 | Disposition: A | Discharge status: Home / Self Care | Payer: No Typology Code available for payment source | Attending: Internal Medicine | Admitting: Internal Medicine

## 2022-01-01 DIAGNOSIS — I519 Heart disease, unspecified: Secondary | ICD-10-CM

## 2022-01-01 DIAGNOSIS — I5031 Acute diastolic (congestive) heart failure: Secondary | ICD-10-CM

## 2022-01-01 HISTORY — PX: PULMONARY THROMBECTOMY: CATH118295

## 2022-01-01 LAB — HEPARIN LEVEL (UNFRACTIONATED)
Heparin Unfractionated: 0.23 IU/mL — ABNORMAL LOW (ref 0.30–0.70)
Heparin Unfractionated: 1.04 IU/mL — ABNORMAL HIGH (ref 0.30–0.70)

## 2022-01-01 LAB — BASIC METABOLIC PANEL
Anion gap: 3 — ABNORMAL LOW (ref 5–15)
BUN: 13 mg/dL (ref 8–23)
CO2: 26 mmol/L (ref 22–32)
Calcium: 8.8 mg/dL — ABNORMAL LOW (ref 8.9–10.3)
Chloride: 112 mmol/L — ABNORMAL HIGH (ref 98–111)
Creatinine, Ser: 0.96 mg/dL (ref 0.44–1.00)
GFR, Estimated: >60 mL/min (ref >60)
Glucose, Bld: 90 mg/dL (ref 70–99)
Potassium: 4.4 mmol/L (ref 3.5–5.1)
Sodium: 141 mmol/L (ref 135–145)

## 2022-01-01 LAB — CBC
HCT: 32.5 % — ABNORMAL LOW (ref 36.0–46.0)
Hemoglobin: 10 g/dL — ABNORMAL LOW (ref 12.0–15.0)
MCH: 26 pg (ref 26.0–34.0)
MCHC: 30.8 g/dL (ref 30.0–36.0)
MCV: 84.4 fL (ref 80.0–100.0)
Platelets: 246 10*3/uL (ref 150–400)
RBC: 3.85 MIL/uL — ABNORMAL LOW (ref 3.87–5.11)
RDW: 15.9 % — ABNORMAL HIGH (ref 11.5–15.5)
WBC: 6.2 10*3/uL (ref 4.0–10.5)
nRBC: 0 % (ref 0.0–0.2)

## 2022-01-01 LAB — ECHOCARDIOGRAM COMPLETE
Height: 66 in
S' Lateral: 2.9 cm
Weight: 3199.32 oz

## 2022-01-01 LAB — APTT
aPTT: 30 seconds (ref 24–36)
aPTT: 150 seconds — ABNORMAL HIGH (ref 24–36)

## 2022-01-01 LAB — MAGNESIUM: Magnesium: 2.1 mg/dL (ref 1.7–2.4)

## 2022-01-01 SURGERY — PULMONARY THROMBECTOMY
Anesthesia: Moderate Sedation | Laterality: Bilateral

## 2022-01-01 MED ORDER — SODIUM CHLORIDE FLUSH 0.9 % IV SOLN
INTRAVENOUS | Status: AC
  Filled 2022-01-01: qty 10

## 2022-01-01 MED ORDER — FENTANYL CITRATE (PF) 100 MCG/2ML IJ SOLN
INTRAMUSCULAR | Status: AC
  Filled 2022-01-01: qty 2

## 2022-01-01 MED ORDER — HEPARIN BOLUS VIA INFUSION
2300.0000 [IU] | Freq: Once | INTRAVENOUS | Status: AC
  Administered 2022-01-01: 2300 [IU] via INTRAVENOUS
  Filled 2022-01-01: qty 2300

## 2022-01-01 MED ORDER — HYDRALAZINE HCL 20 MG/ML IJ SOLN: 5.0000 mg | INTRAMUSCULAR | Status: DC | PRN

## 2022-01-01 MED ORDER — MIDAZOLAM HCL 2 MG/2ML IJ SOLN
INTRAMUSCULAR | Status: DC | PRN
  Administered 2022-01-01: 2 mg via INTRAVENOUS
  Administered 2022-01-01: 1 mg via INTRAVENOUS
  Administered 2022-01-01: 2 mg via INTRAVENOUS

## 2022-01-01 MED ORDER — SODIUM CHLORIDE 0.9% FLUSH: 3.0000 mL | INTRAVENOUS | Status: DC | PRN

## 2022-01-01 MED ORDER — LABETALOL HCL 5 MG/ML IV SOLN: 10.0000 mg | INTRAVENOUS | Status: DC | PRN

## 2022-01-01 MED ORDER — CEFAZOLIN SODIUM-DEXTROSE 2-4 GM/100ML-% IV SOLN
INTRAVENOUS | Status: AC
  Administered 2022-01-01: 2 g via INTRAVENOUS
  Filled 2022-01-01: qty 100

## 2022-01-01 MED ORDER — SODIUM CHLORIDE 0.9 % IV SOLN: INTRAVENOUS | Status: AC

## 2022-01-01 MED ORDER — HEPARIN SODIUM (PORCINE) 1000 UNIT/ML IJ SOLN
INTRAMUSCULAR | Status: AC
  Filled 2022-01-01: qty 10

## 2022-01-01 MED ORDER — SODIUM CHLORIDE 0.9 % IV SOLN
250.0000 mL | INTRAVENOUS | Status: DC | PRN
Start: 2022-01-01 — End: 2022-01-04

## 2022-01-01 MED ORDER — SODIUM CHLORIDE 0.9% FLUSH
3.0000 mL | Freq: Two times a day (BID) | INTRAVENOUS | Status: DC
  Administered 2022-01-01 – 2022-01-04 (×6): 3 mL via INTRAVENOUS

## 2022-01-01 MED ORDER — HEPARIN (PORCINE) 25000 UT/250ML-% IV SOLN
1400.0000 [IU]/h | INTRAVENOUS | Status: DC
Start: 2022-01-01 — End: 2022-01-02
  Administered 2022-01-01 (×2): 1400 [IU]/h via INTRAVENOUS
  Filled 2022-01-01: qty 250

## 2022-01-01 MED ORDER — ACETAMINOPHEN 325 MG PO TABS
650.0000 mg | ORAL_TABLET | ORAL | Status: DC | PRN
  Administered 2022-01-01: 650 mg via ORAL
  Filled 2022-01-01: qty 2

## 2022-01-01 MED ORDER — FENTANYL CITRATE (PF) 100 MCG/2ML IJ SOLN
INTRAMUSCULAR | Status: DC | PRN
  Administered 2022-01-01: 25 ug via INTRAVENOUS
  Administered 2022-01-01 (×2): 50 ug via INTRAVENOUS

## 2022-01-01 MED ORDER — HEPARIN SODIUM (PORCINE) 1000 UNIT/ML IJ SOLN
INTRAMUSCULAR | Status: DC | PRN
  Administered 2022-01-01: 3000 [IU] via INTRAVENOUS

## 2022-01-01 MED ORDER — OXYCODONE HCL 5 MG PO TABS
5.0000 mg | ORAL_TABLET | ORAL | Status: DC | PRN
  Administered 2022-01-01 – 2022-01-03 (×7): 5 mg via ORAL
  Administered 2022-01-03 – 2022-01-04 (×2): 10 mg via ORAL
  Filled 2022-01-01: qty 2
  Filled 2022-01-01 (×3): qty 1
  Filled 2022-01-01: qty 2
  Filled 2022-01-01 (×4): qty 1

## 2022-01-01 MED ORDER — ALTEPLASE 2 MG IJ SOLR
INTRAMUSCULAR | Status: DC | PRN
  Administered 2022-01-01 (×2): 4 mg

## 2022-01-01 MED ORDER — MORPHINE SULFATE (PF) 4 MG/ML IV SOLN
2.0000 mg | INTRAVENOUS | Status: DC | PRN
  Administered 2022-01-01 – 2022-01-04 (×5): 2 mg via INTRAVENOUS
  Filled 2022-01-01 (×5): qty 1

## 2022-01-01 MED ORDER — ALTEPLASE 2 MG IJ SOLR
INTRAMUSCULAR | Status: AC
  Filled 2022-01-01: qty 4

## 2022-01-01 MED ORDER — FENTANYL CITRATE PF 50 MCG/ML IJ SOSY
PREFILLED_SYRINGE | INTRAMUSCULAR | Status: AC
  Filled 2022-01-01: qty 1

## 2022-01-01 MED ORDER — MIDAZOLAM HCL 2 MG/2ML IJ SOLN
INTRAMUSCULAR | Status: AC
  Filled 2022-01-01: qty 2

## 2022-01-01 MED ORDER — IODIXANOL 320 MG/ML IV SOLN
INTRAVENOUS | Status: DC | PRN
  Administered 2022-01-01: 90 mL

## 2022-01-01 MED ORDER — ONDANSETRON HCL 4 MG/2ML IJ SOLN
4.0000 mg | Freq: Four times a day (QID) | INTRAMUSCULAR | Status: DC | PRN
Start: 2022-01-01 — End: 2022-01-04

## 2022-01-01 SURGICAL SUPPLY — 21 items
CANISTER PENUMBRA ENGINE (MISCELLANEOUS) IMPLANT
CATH ANGIO 5F PIGTAIL 100CM (CATHETERS) IMPLANT
CATH INDIGO 12XTORQ 100 (CATHETERS) IMPLANT
CATH INDIGO SEP 12 (CATHETERS) IMPLANT
CLOSURE PERCLOSE PROSTYLE (VASCULAR PRODUCTS) IMPLANT
COVER PROBE U/S 5X48 (MISCELLANEOUS) IMPLANT
DEVICE SAFEGUARD 24CM (GAUZE/BANDAGES/DRESSINGS) IMPLANT
DEVICE STARCLOSE SE CLOSURE (Vascular Products) IMPLANT
GAUZE SPONGE 4X4 12PLY STRL (GAUZE/BANDAGES/DRESSINGS) IMPLANT
GLIDEWIRE ANGLED SS 035X260CM (WIRE) IMPLANT
NDL ENTRY 21GA 7CM ECHOTIP (NEEDLE) IMPLANT
NEEDLE ENTRY 21GA 7CM ECHOTIP (NEEDLE) ×1 IMPLANT
PACK ANGIOGRAPHY (CUSTOM PROCEDURE TRAY) ×1 IMPLANT
SET INTRO CAPELLA COAXIAL (SET/KITS/TRAYS/PACK) IMPLANT
SHEATH BRITE TIP 6FRX11 (SHEATH) IMPLANT
SHEATH PINNACLE 11FRX10 (SHEATH) IMPLANT
SUT SILK 0 FSL (SUTURE) IMPLANT
SYR MEDRAD MARK 7 150ML (SYRINGE) IMPLANT
TUBING CONTRAST HIGH PRESS 72 (TUBING) IMPLANT
WIRE GUIDERIGHT .035X150 (WIRE) IMPLANT
WIRE MAGIC TORQUE 260C (WIRE) IMPLANT

## 2022-01-01 NOTE — Plan of Care (Signed)
  Problem: Education: Goal: Knowledge of General Education information will improve Description: Including pain rating scale, medication(s)/side effects and non-pharmacologic comfort measures Outcome: Progressing   Problem: Health Behavior/Discharge Planning: Goal: Ability to manage health-related needs will improve Outcome: Progressing   Problem: Clinical Measurements: Goal: Ability to maintain clinical measurements within normal limits will improve Outcome: Progressing Goal: Will remain free from infection Outcome: Progressing Goal: Diagnostic test results will improve Outcome: Progressing Goal: Respiratory complications will improve Outcome: Progressing Goal: Cardiovascular complication will be avoided Outcome: Progressing   Problem: Nutrition: Goal: Adequate nutrition will be maintained Outcome: Progressing   Problem: Activity: Goal: Risk for activity intolerance will decrease Outcome: Progressing   Problem: Skin Integrity: Goal: Risk for impaired skin integrity will decrease Outcome: Progressing   Problem: Safety: Goal: Ability to remain free from injury will improve Outcome: Progressing   Problem: Pain Managment: Goal: General experience of comfort will improve Outcome: Progressing

## 2022-01-01 NOTE — Progress Notes (Signed)
PROGRESS NOTE    Leah Roberts  XBL:[id number] DOB: 08-19-1950 DOA: 12/30/2021 PCP: Allegra Grana, FNP   Brief Narrative:  71 y.o. female with medical history significant for DVT in 2013 following a 16-hour plane flight followed by a PE in 2014, currently on Eliquis and who underwent left total knee replacement on 12/09/2021 who presents to the ED with a 2-day history of sharp left-sided chest pain and shortness of breath, worse with exertion.  She reports having missed a dose or 2 of Eliquis a few days prior otherwise has been compliant.  She has been on Eliquis for the past 2 years and previously was on Coumadin.  CTA chest was positive for acute PE with CT evidence of right heart strain consistent with at least submassive PE, RV LV ratio 1.5. She was started on Heparin Drip.    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 Sueanne Margarita.  She has been compliant with Eliquis except when it was held for left knee total arthroplasty on 8/9.  This is not considered failure of Eliquis.  Discussed with Dr. Orlie Dakin who is in agreement with the plan.  Hemodynamically stable at this time.  Does have elevated troponins and BNP. - Echocardiogram-pending - Lower extremity Dopplers-negative - Continue heparin drip, eventually transition back to Eliquis -Vascular following, considering thrombectomy.   Acute respiratory failure with hypoxia (HCC) Psych due to pulmonary embolism, supplemental  oxygen.  Essential hypertension -Not on home meds  GERD -Not on home meds   DVT prophylaxis: Subcu heparin Code Status: Full code Family Communication:    Status is: Inpatient Remains inpatient appropriate because: Maintain hospital stay for management of submassive PE, continue heparin drip for at least another 24 hours     Subjective: Seen and examined at bedside, no complaints  Examination:  Constitutional: Not in acute distress Respiratory: Clear to auscultation bilaterally Cardiovascular: Normal sinus rhythm, no rubs Abdomen: Nontender nondistended good bowel sounds Musculoskeletal: No edema noted Skin: No rashes seen Neurologic: CN 2-12 grossly intact.  And nonfocal Psychiatric: Normal judgment and insight. Alert and oriented x 3. Normal mood.  Objective: Vitals:   12/31/21 1900 12/31/21 2130 12/31/21 2335 01/01/22 0328  BP: 122/75 117/89 112/66 126/85  Pulse: 78 77 95 73  Resp: 20 20 20 20   Temp: 99 F (37.2 C) 98.2 F (36.8 C) 98.1 F (36.7 C) 98.1 F (36.7 C)  TempSrc:  Oral Oral Oral  SpO2: 98% 100% 97% 97%  Weight:      Height:        Intake/Output Summary (Last 24 hours) at 01/01/2022 0731 Last data filed at 01/01/2022 0418 Gross per 24 hour  Intake 1287.34 ml  Output 200 ml  Net 1087.34 ml   Filed Weights   12/30/21 1822  Weight: 90.7 kg     Data Reviewed:   CBC: Recent Labs  Lab 12/30/21 1847 12/31/21 0838 01/01/22 0506  WBC 8.0 6.4 6.2  HGB 11.2* 10.5* 10.0*  HCT 35.8* 34.1*  32.5*  MCV 83.8 85.5 84.4  PLT 275 253 246   Basic Metabolic Panel: Recent Labs  Lab 12/30/21 1847 01/01/22 0506  NA 139 141  K 4.0 4.4  CL 110 112*  CO2 19* 26  GLUCOSE 121* 90  BUN 13 13  CREATININE 0.87 0.96  CALCIUM 9.4 8.8*  MG  --  2.1   GFR: Estimated Creatinine Clearance: 62.8 mL/min (by C-G formula based on SCr of 0.96 mg/dL). Liver Function Tests: Recent Labs  Lab 12/30/21 1847  AST 21  ALT 10  ALKPHOS 67  BILITOT 0.7  PROT  7.5  ALBUMIN 3.7   No results for input(s): "LIPASE", "AMYLASE" in the last 168 hours. No results for input(s): "AMMONIA" in the last 168 hours. Coagulation Profile: Recent Labs  Lab 12/31/21 0040  INR 1.5*   Cardiac Enzymes: No results for input(s): "CKTOTAL", "CKMB", "CKMBINDEX", "TROPONINI" in the last 168 hours. BNP (last 3 results) No results for input(s): "PROBNP" in the last 8760 hours. HbA1C: No results for input(s): "HGBA1C" in the last 72 hours. CBG: No results for input(s): "GLUCAP" in the last 168 hours. Lipid Profile: No results for input(s): "CHOL", "HDL", "LDLCALC", "TRIG", "CHOLHDL", "LDLDIRECT" in the last 72 hours. Thyroid Function Tests: No results for input(s): "TSH", "T4TOTAL", "FREET4", "T3FREE", "THYROIDAB" in the last 72 hours. Anemia Panel: No results for input(s): "VITAMINB12", "FOLATE", "FERRITIN", "TIBC", "IRON", "RETICCTPCT" in the last 72 hours. Sepsis Labs: No results for input(s): "PROCALCITON", "LATICACIDVEN" in the last 168 hours.  Recent Results (from the past 240 hour(s))  Resp Panel by RT-PCR (Flu A&B, Covid) Anterior Nasal Swab     Status: None   Collection Time: 12/30/21 11:12 PM   Specimen: Anterior Nasal Swab  Result Value Ref Range Status   SARS Coronavirus 2 by RT PCR NEGATIVE NEGATIVE Final    Comment: (NOTE) SARS-CoV-2 target nucleic acids are NOT DETECTED.  The SARS-CoV-2 RNA is generally detectable in upper respiratory specimens during the acute phase of infection. The lowest concentration of SARS-CoV-2 viral copies this assay can detect is 138 copies/mL. A negative result does not preclude SARS-Cov-2 infection and should not be used as the sole basis for treatment or other patient management decisions. A negative result may occur with  improper specimen collection/handling, submission of specimen other than nasopharyngeal swab, presence of viral mutation(s) within the areas targeted by this assay, and inadequate number of  viral copies(<138 copies/mL). A negative result must be combined with clinical observations, patient history, and epidemiological information. The expected result is Negative.  Fact Sheet for Patients:  BloggerCourse.com  Fact Sheet for Healthcare Providers:  SeriousBroker.it  This test is no t yet approved or cleared by the [place] FDA and  has been authorized for detection and/or diagnosis of SARS-CoV-2 by FDA under an Emergency Use Authorization (EUA). This EUA will remain  in effect (meaning this test can be used) for the duration of the COVID-19 declaration under Section 564(b)(1) of the Act, 21 U.S.C.section 360bbb-3(b)(1), unless the authorization is terminated  or revoked sooner.       Influenza A by PCR NEGATIVE NEGATIVE Final   Influenza B by PCR NEGATIVE NEGATIVE Final    Comment: (NOTE) The Xpert Xpress SARS-CoV-2/FLU/RSV plus assay is intended as an aid in the diagnosis of influenza from Nasopharyngeal swab specimens and should not be used as a sole basis for treatment. Nasal washings and aspirates are unacceptable for Xpert Xpress SARS-CoV-2/FLU/RSV testing.  Fact Sheet for Patients: BloggerCourse.com  Fact Sheet  for Healthcare Providers: SeriousBroker.it  This test is not yet approved or cleared by the [place] and has been authorized for detection and/or diagnosis of SARS-CoV-2 by FDA under an Emergency Use Authorization (EUA). This EUA will remain in effect (meaning this test can be used) for the duration of the COVID-19 declaration under Section 564(b)(1) of the Act, 21 U.S.C. section 360bbb-3(b)(1), unless the authorization is terminated or revoked.  Performed at [facility], [address] [place], [place] [address]          Radiology Studies: US Venous Img Lower Bilateral  Result Date: 12/31/2021 CLINICAL DATA:   Recent left knee replacement presenting with chest pain and shortness of breath. EXAM: BILATERAL LOWER EXTREMITY VENOUS DOPPLER ULTRASOUND TECHNIQUE: Gray-scale sonography with graded compression, as well as color Doppler and duplex ultrasound were performed to evaluate the lower extremity deep venous systems from the level of the common femoral vein and including the common femoral, femoral, profunda femoral, popliteal and calf veins including the posterior tibial, peroneal and gastrocnemius veins when visible. The superficial great saphenous vein was also interrogated. Spectral Doppler was utilized to evaluate flow at rest and with distal augmentation maneuvers in the common femoral, femoral and popliteal veins. COMPARISON:  None Available. FINDINGS: RIGHT LOWER EXTREMITY Common Femoral Vein: No evidence of thrombus. Normal compressibility, respiratory phasicity and response to augmentation. Saphenofemoral Junction: No evidence of thrombus. Normal compressibility and flow on color Doppler imaging. Profunda Femoral Vein: No evidence of thrombus. Normal compressibility and flow on color Doppler imaging. Femoral Vein: No evidence of thrombus. Normal compressibility, respiratory phasicity and response to augmentation. Popliteal Vein: No evidence of thrombus. Normal compressibility, respiratory phasicity and response to augmentation. Calf Veins: No evidence of thrombus. Normal compressibility and flow on color Doppler imaging. Superficial Great Saphenous Vein: No evidence of thrombus. Normal compressibility. Venous Reflux:  None. Other Findings: A 2.7 cm x 0.6 cm x 2.5 cm anechoic structure is seen within the soft tissues of the right popliteal fossa. No abnormal flow is noted within this region on color Doppler evaluation. LEFT LOWER EXTREMITY Common Femoral Vein: No evidence of thrombus. Normal compressibility, respiratory phasicity and response to augmentation. Saphenofemoral Junction: No evidence of thrombus.  Normal compressibility and flow on color Doppler imaging. Profunda Femoral Vein: No evidence of thrombus. Normal compressibility and flow on color Doppler imaging. Femoral Vein: No evidence of thrombus. Normal compressibility, respiratory phasicity and response to augmentation. Popliteal Vein: No evidence of thrombus. Normal compressibility, respiratory phasicity and response to augmentation. Calf Veins: No evidence of thrombus. Normal compressibility and flow on color Doppler imaging. Superficial Great Saphenous Vein: No evidence of thrombus. Normal compressibility. Venous Reflux:  None. Other Findings: A 3.9 cm x 1.1 cm x 2.7 cm hypoechoic structure is seen within the soft tissues of the left popliteal fossa. No abnormal flow is noted within this region on color Doppler evaluation. IMPRESSION: 1. No evidence of deep venous thrombosis within the RIGHT lower extremity or LEFT lower extremity. 2. Findings consistent with BILATERAL Baker's cysts. Electronically Signed   By: Aram Candela M.D.   On: 12/31/2021 01:29   CT Angio Chest PE W and/or Wo Contrast  Result Date: 12/31/2021 CLINICAL DATA:  Concern for pulmonary embolism. EXAM: CT ANGIOGRAPHY CHEST WITH CONTRAST TECHNIQUE: Multidetector CT imaging of the chest was performed using the standard protocol during bolus administration of intravenous contrast. Multiplanar CT image reconstructions and MIPs were obtained to evaluate the vascular anatomy. RADIATION DOSE REDUCTION: This exam was performed  according to the departmental dose-optimization program which includes automated exposure control, adjustment of the mA and/or kV according to patient size and/or use of iterative reconstruction technique. CONTRAST:  29mL OMNIPAQUE IOHEXOL 350 MG/ML SOLN COMPARISON:  Chest radiograph dated 12/30/2021. CT dated 01/01/2020. FINDINGS: Cardiovascular: Top-normal cardiac size. No pericardial effusion. There is retrograde flow of contrast from the right atrium into the  IVC suggestive of right heart dysfunction. There is enlargement of the right heart chambers with RV/LV ratio of 1.5 consistent with right heart straining. Mild atherosclerotic calcification of the aortic arch. No aneurysmal dilatation or dissection. The origins of the great vessels of the aortic arch appear patent. There is dilatation of the pulmonary arteries suggestive of pulmonary hypertension. Moderate-sized bilateral pulmonary artery emboli involving the lobar pulmonary artery in the lingula and right middle lobe as well as additional segmental emboli bilaterally. Mediastinum/Nodes: No hilar or mediastinal adenopathy. The esophagus and the thyroid gland are grossly unremarkable. No mediastinal fluid collection. Lungs/Pleura: No focal consolidation, pleural effusion, or pneumothorax. The central airways are patent. Upper Abdomen: No acute abnormality. Musculoskeletal: Mild degenerative changes. No acute osseous pathology. Review of the MIP images confirms the above findings. IMPRESSION: 1. Positive for acute PE with CT evidence of right heart strain (RV/LV Ratio = 1.5) consistent with at least submassive (intermediate risk) PE. The presence of right heart strain has been associated with an increased risk of morbidity and mortality. Please refer to the "Code PE Focused" order set in EPIC. 2. Aortic Atherosclerosis (ICD10-I70.0). Electronically Signed   By: Elgie Collard M.D.   On: 12/31/2021 00:13   DG Chest Port 1 View  Result Date: 12/30/2021 CLINICAL DATA:  Shortness of breath EXAM: PORTABLE CHEST 1 VIEW COMPARISON:  12/31/2019 FINDINGS: Cardiomegaly with mild central congestion. No acute airspace disease, pleural effusion or pneumothorax. Central pulmonary vessels appear slightly enlarged. IMPRESSION: 1. Cardiomegaly with mild central congestion 2. Prominent central pulmonary vessels, question due to pulmonary arterial hypertension. Electronically Signed   By: Jasmine Pang M.D.   On: 12/30/2021 19:09         Scheduled Meds:  docusate sodium  100 mg Oral BID   heparin  2,300 Units Intravenous Once   Continuous Infusions:  sodium chloride 125 mL/hr at 01/01/22 0418   sodium chloride      ceFAZolin (ANCEF) IV     heparin 1,400 Units/hr (01/01/22 0728)     LOS: 1 day   Time spent= 35 mins    Ayrabella Labombard Joline Maxcy, MD [facility]  If 7PM-7AM, please contact night-coverage  01/01/2022, 7:31 AM

## 2022-01-01 NOTE — Progress Notes (Signed)
ANTICOAGULATION CONSULT NOTE  Pharmacy Consult for Heparin infusion Indication: acute submassive PE  Patient Measurements: Height: 5\' 6"  (167.6 cm) Weight: 90.7 kg (199 lb 15.3 oz) IBW/kg (Calculated) : 59.3 Heparin Dosing Weight: 79.1 kg   Labs: Recent Labs    12/30/21 1847 12/30/21 1847 12/30/21 2313 12/31/21 0040 12/31/21 0838 12/31/21 1700 12/31/21 2309 01/01/22 0506  HGB 11.2*  --   --   --  10.5*  --   --  10.0*  HCT 35.8*  --   --   --  34.1*  --   --  32.5*  PLT 275  --   --   --  253  --   --  246  APTT  --    < >  --  32 58* 84* 71* 30  LABPROT  --   --   --  18.2*  --   --   --   --   INR  --   --   --  1.5*  --   --   --   --   HEPARINUNFRC  --   --   --  >1.10* >1.10*  --   --  0.23*  CREATININE 0.87  --   --   --   --   --   --  0.96  TROPONINIHS 271*  --  275* 267*  --   --   --   --    < > = values in this interval not displayed.     Estimated Creatinine Clearance: 62.8 mL/min (by C-G formula based on SCr of 0.96 mg/dL).  Medical History: Past Medical History:  Diagnosis Date   Arthritis    DVT (deep venous thrombosis) (HCC) 2013   Dyspnea    due to knee pain   Family history of adverse reaction to anesthesia    Brother and sister hard to wake up.   GERD (gastroesophageal reflux disease)    History of blood transfusion    History of hypertension    Hypertension    Peripheral vascular disease (HCC)    DVT   Pre-diabetes    Pulmonary embolism [facility]) 2013   Assessment: Leah Roberts is a 71 year old female admitted with PE. PMH significant for history of DVT (2/2 16 hr flight in 2014, on eliquis), arthritis, GERD, HTN, PVD, pre-diabetes. Patient on Eliquis 5 mg PO BID with last dose taken in ED lobby 8/30 @ ~2300. 8/30 CTA chest positve for acute PE with CT evidence of right heart strain (RV/LV Ratio = 1.5) consistent with at least submassive (intermediate risk) PE. 8/31 venous 9/31 negative for DVT in either lower extremity. Pharmacy has been  consulted to initiate and manage heparin infusion.  Baseline Labs: aPTT 32, HL >1.10, PT 18.2, INR 1.5, Hgb 11.2, Hct 35.7, Plt 275  Goal of Therapy:  Heparin level 0.3-0.7 units/ml aPTT 66 - 102  seconds Monitor platelets by anticoagulation protocol: Yes  Monitoring  Date Time HL/aPTT Rate/Comment  8/31 0838 >1.10/58 SUBtherapeutic 8/31 1700 N/A / 84 Therapeutic x 1 8/31     2309         71              Therapeutic X 2  9/01     0506    0.23, 30          SUBtherapeutic   Plan:  - Heparin stopped d/t vascular procedure. Will restart drip at previous rate of 1400 units/hr.  -  Will recheck aPTT/HL in 6 hrs after restart Will continue to dose based off of aPTT until both levels correlate, then will transition to HL dosing. - CBC/HL daily  Thank you for allowing pharmacy to be a part of this patient's care.  Harrell Lark A Jadee Golebiewski  01/01/2022 6:42 PM

## 2022-01-01 NOTE — Progress Notes (Signed)
Informed consent in chart

## 2022-01-01 NOTE — Progress Notes (Signed)
22G PIV inserted in Right Hand

## 2022-01-01 NOTE — Progress Notes (Signed)
*  PRELIMINARY RESULTS* Echocardiogram 2D Echocardiogram has been performed.  Leah Roberts 01/01/2022, 8:22 AM

## 2022-01-01 NOTE — Progress Notes (Signed)
Family, sister, at bedside visiting

## 2022-01-01 NOTE — Progress Notes (Signed)
ANTICOAGULATION CONSULT NOTE  Pharmacy Consult for Heparin infusion Indication: acute submassive PE  Patient Measurements: Height: 5\' 6"  (167.6 cm) Weight: 90.7 kg (199 lb 15.3 oz) IBW/kg (Calculated) : 59.3 Heparin Dosing Weight: 79.1 kg   Labs: Recent Labs    12/30/21 1847 12/30/21 1847 12/30/21 2313 12/31/21 0040 12/31/21 0838 12/31/21 1700 12/31/21 2309 01/01/22 0506  HGB 11.2*  --   --   --  10.5*  --   --  10.0*  HCT 35.8*  --   --   --  34.1*  --   --  32.5*  PLT 275  --   --   --  253  --   --  246  APTT  --    < >  --  32 58* 84* 71* 30  LABPROT  --   --   --  18.2*  --   --   --   --   INR  --   --   --  1.5*  --   --   --   --   HEPARINUNFRC  --   --   --  >1.10* >1.10*  --   --  0.23*  CREATININE 0.87  --   --   --   --   --   --  0.96  TROPONINIHS 271*  --  275* 267*  --   --   --   --    < > = values in this interval not displayed.     Estimated Creatinine Clearance: 62.8 mL/min (by C-G formula based on SCr of 0.96 mg/dL).  Medical History: Past Medical History:  Diagnosis Date   Arthritis    DVT (deep venous thrombosis) (HCC) 2013   Dyspnea    due to knee pain   Family history of adverse reaction to anesthesia    Brother and sister hard to wake up.   GERD (gastroesophageal reflux disease)    History of blood transfusion    History of hypertension    Hypertension    Peripheral vascular disease (HCC)    DVT   Pre-diabetes    Pulmonary embolism [facility]) 2013   Assessment: Leah Roberts is a 71 year old female admitted with PE. PMH significant for history of DVT (2/2 16 hr flight in 2014, on eliquis), arthritis, GERD, HTN, PVD, pre-diabetes. Patient on Eliquis 5 mg PO BID with last dose taken in ED lobby 8/30 @ ~2300. 8/30 CTA chest positve for acute PE with CT evidence of right heart strain (RV/LV Ratio = 1.5) consistent with at least submassive (intermediate risk) PE. 8/31 venous 9/31 negative for DVT in either lower extremity. Pharmacy has been  consulted to initiate and manage heparin infusion.  Baseline Labs: aPTT 32, HL >1.10, PT 18.2, INR 1.5, Hgb 11.2, Hct 35.7, Plt 275  Goal of Therapy:  Heparin level 0.3-0.7 units/ml aPTT 66 - 102  seconds Monitor platelets by anticoagulation protocol: Yes  Monitoring  Date Time HL/aPTT Rate/Comment  8/31 0838 >1.10/58 SUBtherapeutic 8/31 1700 N/A / 84 Therapeutic x 1 8/31     2309         71              Therapeutic X 2  9/01     0506    0.23, 30          SUBtherapeutic   Plan:  9/1 @ 0506:  aPTT = 30,  HL = 0.23 , SUBtherapeutic  - Will order  heparin 2300 units IV X 1 bolus and increase drip rate to 1400 units/hr.  - Will recheck aPTT 6 hrs after rate change - Will recheck HL on 9/2 with AM labs.   Thank you for allowing pharmacy to be a part of this patient's care.  Zamoria Boss D  01/01/2022 6:47 AM

## 2022-01-01 NOTE — Interval H&P Note (Signed)
History and Physical Interval Note:  01/01/2022 1:41 PM  Leah Roberts  has presented today for surgery, with the diagnosis of PE.  The various methods of treatment have been discussed with the patient and family. After consideration of risks, benefits and other options for treatment, the patient has consented to  Procedure(s): PULMONARY THROMBECTOMY (Bilateral) as a surgical intervention.  The patient's history has been reviewed, patient examined, no change in status, stable for surgery.  I have reviewed the patient's chart and labs.  Questions were answered to the patient's satisfaction.     Levora Dredge

## 2022-01-01 NOTE — Progress Notes (Signed)
Dr Gilda Crease at bedside talking with pt

## 2022-01-01 NOTE — Op Note (Signed)
[facility]  Percutaneous Study/Intervention Procedural Note   Date of Surgery: 01/01/2022,3:23 PM  Surgeon:Mehran Guderian, Latina Craver   Pre-operative Diagnosis: Symptomatic pulmonary emboli with right heart strain and hypoxia  Post-operative diagnosis:  Same  Procedure(s) Performed:  1.  Selective injection right subsegmental pulmonary arteries; right upper lobe, middle lobe and lower lobe pulmonary arteries.  2.  Selective injection left subsegmental pulmonary arteries; I left lower lobe pulmonary arteries  3.  Infusion of TPA for thrombolysis  4.  Mechanical thrombectomy bilateral lobar pulmonary arteries for removal of pulmonary emboli using the Penumbra CAT 8 thrombectomy catheter.    Anesthesia: Conscious sedation was administered under my direct supervision by the interventional radiology RN. IV Versed plus fentanyl were utilized. Continuous ECG, pulse oximetry and blood pressure was monitored throughout the entire procedure.  Conscious sedation was administered for a total of 1 hour 20 minutes and 31 seconds.  Sheath: 11 French 11 cm Pinnacle sheath antegrade right common femoral vein; iatrogenic 6 French sheath right common femoral artery  Contrast: 90 cc   Fluoroscopy Time: 14.7 minutes  Indications:  Patient presented to the hospital with hypoxia and shortness of breath. The patient is unable to get out of bed and transfer to a chair without increased dyspnea. The patient's O2 saturations off nasal cannula are in the 80%. CT angiogram demonstrated bilateral pulmonary emboli associated with significant right heart strain. Given the long-term sequela and the patient's symptomatic condition the risks and benefits for angiography with thrombectomy are reviewed all questions are answered, the patient agrees to proceed.  Procedure:  Leah Roberts a 71 y.o. female who was identified and appropriate procedural time out was performed.  The patient was then placed  supine on the table and prepped and draped in the usual sterile fashion.  Ultrasound was used to evaluate the right right common femoral vein.  It was compressible indicating it is patent .  A digital ultrasound image was acquired for the permanent record.  A micropuncture needle was used to access the right common femoral vein under direct ultrasound guidance.  A microwire was then advanced under fluoroscopic guidance followed by micro-sheath.  A 0.035 J wire was advanced without resistance and a 6 Fr sheath was placed.  However, when I advanced the Perclose over the wire the sideport demonstrated pulsatile flow suspicious for arterial catheter placement.  The Perclose was deployed as usual removed over the wire and the 6 French sheath reinserted.  Hand-injection of contrast in an RAO projection demonstrated that indeed this first sheath had been placed in the mid common femoral.  The sheath was flushed with heparinized saline.  The ultrasound was returned to the field in a new sterile sleeve. Ultrasound was used to evaluate the right right common femoral vein.  It was compressible indicating it is patent .  A digital ultrasound image was acquired for the permanent record.  A micropuncture needle was used to access the right common femoral vein under direct ultrasound guidance.  A microwire was then advanced under fluoroscopic guidance followed by micro-sheath.  A 0.035 J wire was advanced without resistance and a 6 Fr sheath was placed.    3000 units of heparin was then given and allowed to circulate.  TPA was reconstituted and delivered onto the table. A total of 8 milligrams of TPA was utilized.  4 was administered on the left side and 4 or was administered on the right side. This was then allowed to dwell for 20-30 minutes.  The J-wire and pigtail catheter was advanced up to the right atrium where a bolus injection contrast was used to demonstrate the pulmonary artery outflow.  Stiff angled Glidewire  was then exchanged for the J-wire and the pigtail catheter was exchanged for a pigtail catheter. Using the combination of the stiff angled Glidewire and the pigtail catheter the pulmonary outflow track was selected.  The left main pulmonary artery was evaluated first.  An Amplatz wire was then exchanged for the stiff angle Glidewire and the pigtail catheter was exchanged back to the pigtail catheter.  Then with the catheter in the left pulmonary artery bolus injection contrast was utilized to demonstrate the thrombus as well as the segmental pulmonary artery vasculature. This demonstrated thrombus in the distal left main pulmonary artery extending into the left upper lobe artery as well as the left lower lobe artery and noting that it was near occlusive.  The pigtail catheter was then advanced out so that it was positioned within the thrombus and 4 milligrams of TPA were infused directly into the clot.  After an appropriate dwell time the Amplatz wire was reintroduced through the pigtail catheter and the pigtail catheter removed.  The Penumbra Cat 12 extra torque catheter was then advanced into the thrombus in the left lower lobe pulmonary artery.  Hand-injection contrast was used to verify the positioning and evaluate the distal anatomy.  Mechanical aspiration was performed using the CAT 12 catheter and a separator.  After multiple passes the catheter was then repositioned into the left main pulmonary artery and hand-injection of contrast was performed to verify positioning and evaluate the distal anatomy to give an assessment of the left pulmonary vasculature and the effectiveness of thrombectomy.  Satisfied with the thrombectomy on the left, I then used the penumbra CAT 12 device as well as a Glidewire and an angled catheter to select the right main pulmonary artery  A select catheter was then advanced over the Amplatz wire into the distal right main pulmonary artery and hand-injection confirmed the  thrombus.  4 mg of tPA was then injected directly into the thrombus within the right distal main pulmonary artery.  After an appropriate dwell time the Amplatz wire was reintroduced through the select catheter and the select catheter removed.  The Penumbra Cat 12 extra torque catheter was then advanced into the thrombus in the right lower lobe pulmonary artery.  Hand-injection contrast was used to verify positioning and evaluate the distal anatomy.  Mechanical aspiration was performed using the CAT 12 catheter and a separator.  After multiple passes the catheter was then repositioned into the right middle lobe pulmonary artery and again hand-injection contrast was performed to verify position and evaluate the distal anatomy.  Multiple passes were made using mechanical aspiration in association with a separator.  Lastly, using a combination of the separator catheter and Glidewire the CAT 12 device was negotiated into the right upper lobe pulmonary artery.  Hand-injection of contrast was used to verify positioning and evaluate the distal anatomy.  Multiple passes were then performed using the separator with the CAT 12 penumbra catheter.  Once there was free flow of blood from all 3 lobar arteries the catheter was repositioned to the right main pulmonary artery.  Hand-injection contrast was then used to give an assessment of the effectiveness of thrombectomy.  After review these images the catheter and sheath were removed and pressure held. There were no immediate complications.  Findings:   Right pulmonary artery: Initial images of the right  pulmonary artery demonstrated occlusion of the right upper lobe artery with significant thrombus within the distal right middle and lower lobar arteries as well as the segmental branches.  Following thrombectomy there is resolution of the vast majority of the thrombus.  Left pulmonary:  Initial images of the left pulmonary artery demonstrated thrombus in the lower lobar  arteries as well as the segmental branches.  Following thrombectomy there is resolution of the vast majority of the thrombus.    Disposition: Patient was taken to the recovery room in stable condition having tolerated the procedure well.  Earl Lites Sorcha Rotunno 01/01/2022,3:23 PM

## 2022-01-01 NOTE — Progress Notes (Signed)
Pt drinking cranberry drink

## 2022-01-02 ENCOUNTER — Inpatient Hospital Stay: Payer: No Typology Code available for payment source

## 2022-01-02 LAB — BASIC METABOLIC PANEL
Anion gap: 3 — ABNORMAL LOW (ref 5–15)
BUN: 14 mg/dL (ref 8–23)
CO2: 22 mmol/L (ref 22–32)
Calcium: 8.1 mg/dL — ABNORMAL LOW (ref 8.9–10.3)
Chloride: 112 mmol/L — ABNORMAL HIGH (ref 98–111)
Creatinine, Ser: 0.9 mg/dL (ref 0.44–1.00)
GFR, Estimated: >60 mL/min (ref >60)
Glucose, Bld: 96 mg/dL (ref 70–99)
Potassium: 4 mmol/L (ref 3.5–5.1)
Sodium: 137 mmol/L (ref 135–145)

## 2022-01-02 LAB — CBC
HCT: 30.2 % — ABNORMAL LOW (ref 36.0–46.0)
Hemoglobin: 9.6 g/dL — ABNORMAL LOW (ref 12.0–15.0)
MCH: 26.5 pg (ref 26.0–34.0)
MCHC: 31.8 g/dL (ref 30.0–36.0)
MCV: 83.4 fL (ref 80.0–100.0)
Platelets: 207 10*3/uL (ref 150–400)
RBC: 3.62 MIL/uL — ABNORMAL LOW (ref 3.87–5.11)
RDW: 15.4 % (ref 11.5–15.5)
WBC: 8.5 10*3/uL (ref 4.0–10.5)
nRBC: 0 % (ref 0.0–0.2)

## 2022-01-02 LAB — HEPARIN LEVEL (UNFRACTIONATED)
Heparin Unfractionated: 0.68 IU/mL (ref 0.30–0.70)
Heparin Unfractionated: 0.68 IU/mL (ref 0.30–0.70)

## 2022-01-02 LAB — MAGNESIUM: Magnesium: 1.8 mg/dL (ref 1.7–2.4)

## 2022-01-02 LAB — APTT
aPTT: 174 seconds (ref 24–36)
aPTT: 93 seconds — ABNORMAL HIGH (ref 24–36)

## 2022-01-02 MED ORDER — HEPARIN (PORCINE) 25000 UT/250ML-% IV SOLN: 1100.0000 [IU]/h | INTRAVENOUS | Status: DC

## 2022-01-02 MED ORDER — HEPARIN (PORCINE) 25000 UT/250ML-% IV SOLN
1050.0000 [IU]/h | INTRAVENOUS | Status: DC
  Administered 2022-01-03: 1050 [IU]/h via INTRAVENOUS
  Filled 2022-01-02 (×2): qty 250

## 2022-01-02 NOTE — Progress Notes (Signed)
ANTICOAGULATION CONSULT NOTE  Pharmacy Consult for Heparin infusion Indication: acute submassive PE  Patient Measurements: Height: 5\' 6"  (167.6 cm) Weight: 90.7 kg (199 lb 15.3 oz) IBW/kg (Calculated) : 59.3 Heparin Dosing Weight: 79.1 kg   Labs: Recent Labs    12/30/21 1847 12/30/21 1847 12/30/21 2313 12/31/21 0040 12/31/21 0838 12/31/21 1700 01/01/22 0506 01/01/22 2320 01/02/22 0132 01/02/22 1223 01/02/22 1758  HGB 11.2*  --   --   --  10.5*  --  10.0*  --  9.6*  --   --   HCT 35.8*  --   --   --  34.1*  --  32.5*  --  30.2*  --   --   PLT 275  --   --   --  253  --  246  --  207  --   --   APTT  --    < >  --  32 58*   < > 30 150* 174* 93*  --   LABPROT  --   --   --  18.2*  --   --   --   --   --   --   --   INR  --   --   --  1.5*  --   --   --   --   --   --   --   HEPARINUNFRC  --    < >  --  >1.10* >1.10*  --  0.23* 1.04*  --  0.68 0.68  CREATININE 0.87  --   --   --   --   --  0.96  --  0.90  --   --   TROPONINIHS 271*  --  275* 267*  --   --   --   --   --   --   --    < > = values in this interval not displayed.     Estimated Creatinine Clearance: 67 mL/min (by C-G formula based on SCr of 0.9 mg/dL).  Medical History: Past Medical History:  Diagnosis Date   Arthritis    DVT (deep venous thrombosis) (HCC) 2013   Dyspnea    due to knee pain   Family history of adverse reaction to anesthesia    Brother and sister hard to wake up.   GERD (gastroesophageal reflux disease)    History of blood transfusion    History of hypertension    Hypertension    Peripheral vascular disease (HCC)    DVT   Pre-diabetes    Pulmonary embolism [facility]) 2013   Assessment: Leah Roberts is a 71 year old female admitted with PE. PMH significant for history of DVT (2/2 16 hr flight in 2014, on eliquis), arthritis, GERD, HTN, PVD, pre-diabetes. Patient on Eliquis 5 mg PO BID with last dose taken in ED lobby 8/30 @ ~2300. 8/30 CTA chest positve for acute PE with CT evidence  of right heart strain (RV/LV Ratio = 1.5) consistent with at least submassive (intermediate risk) PE. 8/31 venous 9/31 negative for DVT in either lower extremity. Pharmacy has been consulted to initiate and manage heparin infusion.  Baseline Labs: aPTT 32, HL >1.10, PT 18.2, INR 1.5, Hgb 11.2, Hct 35.7, Plt 275  Goal of Therapy:  Heparin level 0.3-0.7 units/ml aPTT 66 - 102  seconds Monitor platelets by anticoagulation protocol: Yes  Monitoring  Date Time HL/aPTT Rate/Comment  8/31 0838 >1.10/58 SUBtherapeutic 8/31 1700 N/A / 84 Therapeutic x 1 8/31  2309         71              Therapeutic X 2  9/01     0506    0.23, 30          SUBtherapeutic 9/01     2320     150                Elevated after restart, lab error ? 9/02     0132     174                Elevated (repeat previous level) -RN reports pt oozing blood at puncture site.  Vascular advised to hold heparin drip for additional hour (2 hrs)so will restart around 0530.  9/02 1223 0.68/93 9/02     1758    0.68    Plan:  -9/02 1758 HL=0.68 therapeutic x 1 after rate decrease to 1050 units/hr. F/u HL in 6 hrs CBC daily  Thank you for allowing pharmacy to be a part of this patient's care.  Clovia Cuff, PharmD, BCPS 01/02/2022 6:45 PM

## 2022-01-02 NOTE — Significant Event (Signed)
PTT elevated. Discussed with pharmacist and repeat PTT ordered and per pharmacist continue heparin until repeat result. Repeat PTT heparin also elevated; discussed with pharmacy and heparin stopped with plan to restart at lower dose in 1 hour. No bleeding or hematoma at right groin.   A little over 1 hour later, went to restart heparin but noted blood oozing from right groin. Unable to see active bleeding from under PAD. No hematoma felt.   Paged on call vascular, Dr. Myra Gianotti. Per MD: Remove PAD from right groin and hold manual pressure for 15 minutes then place gauze dressing. Hold heparin for 1 more hour then ok to restart if no more bleeding.  Above done and heparin restarted at lower dose 1 hour later; no further bleeding noted at time of heparin restart.  Pedal pulses 2+

## 2022-01-02 NOTE — Progress Notes (Addendum)
ANTICOAGULATION CONSULT NOTE  Pharmacy Consult for Heparin infusion Indication: acute submassive PE  Patient Measurements: Height: 5\' 6"  (167.6 cm) Weight: 90.7 kg (199 lb 15.3 oz) IBW/kg (Calculated) : 59.3 Heparin Dosing Weight: 79.1 kg   Labs: Recent Labs    12/30/21 1847 12/30/21 1847 12/30/21 2313 12/31/21 0040 12/31/21 0838 12/31/21 1700 01/01/22 0506 01/01/22 2320 01/02/22 0132  HGB 11.2*  --   --   --  10.5*  --  10.0*  --  9.6*  HCT 35.8*  --   --   --  34.1*  --  32.5*  --  30.2*  PLT 275  --   --   --  253  --  246  --  207  APTT  --    < >  --  32 58*   < > 30 150* 174*  LABPROT  --   --   --  18.2*  --   --   --   --   --   INR  --   --   --  1.5*  --   --   --   --   --   HEPARINUNFRC  --    < >  --  >1.10* >1.10*  --  0.23* 1.04*  --   CREATININE 0.87  --   --   --   --   --  0.96  --  0.90  TROPONINIHS 271*  --  275* 267*  --   --   --   --   --    < > = values in this interval not displayed.     Estimated Creatinine Clearance: 67 mL/min (by C-G formula based on SCr of 0.9 mg/dL).  Medical History: Past Medical History:  Diagnosis Date   Arthritis    DVT (deep venous thrombosis) (HCC) 2013   Dyspnea    due to knee pain   Family history of adverse reaction to anesthesia    Brother and sister hard to wake up.   GERD (gastroesophageal reflux disease)    History of blood transfusion    History of hypertension    Hypertension    Peripheral vascular disease (HCC)    DVT   Pre-diabetes    Pulmonary embolism [facility]) 2013   Assessment: Leah Roberts is a 71 year old female admitted with PE. PMH significant for history of DVT (2/2 16 hr flight in 2014, on eliquis), arthritis, GERD, HTN, PVD, pre-diabetes. Patient on Eliquis 5 mg PO BID with last dose taken in ED lobby 8/30 @ ~2300. 8/30 CTA chest positve for acute PE with CT evidence of right heart strain (RV/LV Ratio = 1.5) consistent with at least submassive (intermediate risk) PE. 8/31 venous 9/31  negative for DVT in either lower extremity. Pharmacy has been consulted to initiate and manage heparin infusion.  Baseline Labs: aPTT 32, HL >1.10, PT 18.2, INR 1.5, Hgb 11.2, Hct 35.7, Plt 275  Goal of Therapy:  Heparin level 0.3-0.7 units/ml aPTT 66 - 102  seconds Monitor platelets by anticoagulation protocol: Yes  Monitoring  Date Time HL/aPTT Rate/Comment  8/31 0838 >1.10/58 SUBtherapeutic 8/31 1700 N/A / 84 Therapeutic x 1 8/31     2309         71              Therapeutic X 2  9/01     0506    0.23, 30          SUBtherapeutic  9/01     2320     150                Elevated after restart, lab error ? 9/02     0132     174                Elevated (repeat previous level)   Plan:  - Heparin stopped d/t vascular procedure. Will restart drip at previous rate of 1400 units/hr.  - Will recheck aPTT/HL in 6 hrs after restart Will continue to dose based off of aPTT until both levels correlate, then will transition to HL dosing. - CBC/HL daily  9/1: aPTT @ 2320 = 150, elevated - suspect elevated due to lab error.  Ordered STAT repeat  9/2: aPTT @ 0132 = 174, elevated X 2  - previous level would be valid - Will hold heparin drip for 1 hr and restart @ 0415 at 1100 units/hr.  - Will recheck HL and aPTT 6 hrs after restart  9/2:  RN reports pt oozing blood at puncture site.  Vascular advised to hold heparin drip for additional hour so will restart around 0530.   Thank you for allowing pharmacy to be a part of this patient's care.  Karisha Marlin D  01/02/2022 3:19 AM

## 2022-01-02 NOTE — Progress Notes (Signed)
ANTICOAGULATION CONSULT NOTE  Pharmacy Consult for Heparin infusion Indication: acute submassive PE  Patient Measurements: Height: 5\' 6"  (167.6 cm) Weight: 90.7 kg (199 lb 15.3 oz) IBW/kg (Calculated) : 59.3 Heparin Dosing Weight: 79.1 kg   Labs: Recent Labs    12/30/21 1847 12/30/21 1847 12/30/21 2313 12/31/21 0040 12/31/21 0838 12/31/21 1700 01/01/22 0506 01/01/22 2320 01/02/22 0132 01/02/22 1223  HGB 11.2*  --   --   --  10.5*  --  10.0*  --  9.6*  --   HCT 35.8*  --   --   --  34.1*  --  32.5*  --  30.2*  --   PLT 275  --   --   --  253  --  246  --  207  --   APTT  --    < >  --  32 58*   < > 30 150* 174* 93*  LABPROT  --   --   --  18.2*  --   --   --   --   --   --   INR  --   --   --  1.5*  --   --   --   --   --   --   HEPARINUNFRC  --    < >  --  >1.10* >1.10*  --  0.23* 1.04*  --  0.68  CREATININE 0.87  --   --   --   --   --  0.96  --  0.90  --   TROPONINIHS 271*  --  275* 267*  --   --   --   --   --   --    < > = values in this interval not displayed.     Estimated Creatinine Clearance: 67 mL/min (by C-G formula based on SCr of 0.9 mg/dL).  Medical History: Past Medical History:  Diagnosis Date   Arthritis    DVT (deep venous thrombosis) (HCC) 2013   Dyspnea    due to knee pain   Family history of adverse reaction to anesthesia    Brother and sister hard to wake up.   GERD (gastroesophageal reflux disease)    History of blood transfusion    History of hypertension    Hypertension    Peripheral vascular disease (HCC)    DVT   Pre-diabetes    Pulmonary embolism [facility]) 2013   Assessment: Leah Roberts is a 71 year old female admitted with PE. PMH significant for history of DVT (2/2 16 hr flight in 2014, on eliquis), arthritis, GERD, HTN, PVD, pre-diabetes. Patient on Eliquis 5 mg PO BID with last dose taken in ED lobby 8/30 @ ~2300. 8/30 CTA chest positve for acute PE with CT evidence of right heart strain (RV/LV Ratio = 1.5) consistent with  at least submassive (intermediate risk) PE. 8/31 venous 9/31 negative for DVT in either lower extremity. Pharmacy has been consulted to initiate and manage heparin infusion.  Baseline Labs: aPTT 32, HL >1.10, PT 18.2, INR 1.5, Hgb 11.2, Hct 35.7, Plt 275  Goal of Therapy:  Heparin level 0.3-0.7 units/ml aPTT 66 - 102  seconds Monitor platelets by anticoagulation protocol: Yes  Monitoring  Date Time HL/aPTT Rate/Comment  8/31 0838 >1.10/58 SUBtherapeutic 8/31 1700 N/A / 84 Therapeutic x 1 8/31     2309         71  Therapeutic X 2  9/01     0506    0.23, 30          SUBtherapeutic 9/01     2320     150                Elevated after restart, lab error ? 9/02     0132     174                Elevated (repeat previous level) -RN reports pt oozing blood at puncture site.  Vascular advised to hold heparin drip for additional hour (2 hrs)so will restart around 0530.  9/02 1223 0.68/93    Plan:  -9/02 1223 HL=0.68/aPTT=93  both therapeutic Will slightly decrease drip rate as on upper therapeutic end to 1050 units/hr. Hgb 9.6  plt 207 HL and aPTT are correlating- will order HL going forward F/u HL in 6 hrs CBC daily  Thank you for allowing pharmacy to be a part of this patient's care.  Alazia Crocket A  01/02/2022 1:12 PM

## 2022-01-02 NOTE — Progress Notes (Signed)
PROGRESS NOTE    Leah Roberts  MLY:[id number] DOB: 02-11-1951 DOA: 12/30/2021 PCP: Allegra Grana, FNP   Brief Narrative:  71 y.o. female with medical history significant for DVT in 2013 following a 16-hour plane flight followed by a PE in 2014, currently on Eliquis and who underwent left total knee replacement on 12/09/2021 who presents to the ED with a 2-day history of sharp left-sided chest pain and shortness of breath, worse with exertion.  She reports having missed a dose or 2 of Eliquis a few days prior otherwise has been compliant.  She has been on Eliquis for the past 2 years and previously was on Coumadin.  CTA chest was positive for acute PE with CT evidence of right heart strain consistent with at least submassive PE, RV LV ratio 1.5. She was started on Heparin Drip.  She was seen by vascular and underwent thrombectomy on 01/01/2022.  Following day had some bleeding.   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 Sueanne Margarita.  She has been compliant with Eliquis except when it was held for left knee total arthroplasty on 8/9.  This is not considered failure of Eliquis.  Discussed with Dr. Orlie Dakin who is in agreement with the plan.  Hemodynamically stable at this time.  Does have elevated troponins and BNP. - Echocardiogram-EF 60%. - Lower extremity Dopplers-negative - Continue heparin drip, eventually transition back to Eliquis - Status  post thrombectomy by vascular on 9/1.  Postop today she is having bleeding from right groin.  Ordered ultrasound to rule out pseudoaneurysm  Acute respiratory failure with hypoxia (HCC) Psych due to pulmonary embolism, supplemental oxygen.  Essential hypertension -Not on home meds  GERD -Not on home meds   DVT prophylaxis: Subcu heparin Code Status: Full code Family Communication:    Status is: Inpatient Remains inpatient appropriate because: Some bleeding from her right groin.  Maintain hospital stay to closely monitor this.  Subjective: Her thrombectomy went well yesterday.  Shortness of breath slightly better.  This morning started having bleeding from her right groin.  Vascular was notified.  Examination: Constitutional: Not in acute distress Respiratory: Clear to auscultation bilaterally Cardiovascular: Normal sinus rhythm, no rubs Abdomen: Nontender nondistended good bowel sounds Musculoskeletal: No edema noted Skin: No rashes seen, right groin dressing noted.  No active bleeding noted but tender to palpation. Neurologic: CN 2-12 grossly intact.  And nonfocal Psychiatric: Normal judgment and insight. Alert and oriented x 3. Normal mood. Objective: Vitals:   01/01/22 1944 01/01/22 2334 01/02/22 0405 01/02/22 0730  BP: 131/81 116/76 126/81 118/76  Pulse: 81  81 79  Resp: 16 16 19 18   Temp: (!) 97.5 F (36.4 C) 97.8 F (36.6 C) (!) 97.3 F (36.3 C) 98.2 F (36.8 C)  TempSrc:  Oral Oral   SpO2: 96% 97% 93% 96%  Weight:      Height:        Intake/Output Summary (Last 24 hours) at 01/02/2022  [place] filed at 01/01/2022 2134 Gross per 24 hour  Intake 578.37 ml  Output 550 ml  Net 28.37 ml   Filed Weights   12/30/21 1822  Weight: 90.7 kg     Data Reviewed:   CBC: Recent Labs  Lab 12/30/21 1847 12/31/21 0838 01/01/22 0506 01/02/22 0132  WBC 8.0 6.4 6.2 8.5  HGB 11.2* 10.5* 10.0* 9.6*  HCT 35.8* 34.1* 32.5* 30.2*  MCV 83.8 85.5 84.4 83.4  PLT  275 253 246 [id number]   Basic Metabolic Panel: Recent Labs  Lab 12/30/21 1847 01/01/22 0506 01/02/22 0132  NA 139 141 137  K 4.0 4.4 4.0  CL 110 112* 112*  CO2 19* 26 22  GLUCOSE 121* 90 96  BUN 13 13 14   CREATININE 0.87 0.96 0.90  CALCIUM 9.4 8.8* 8.1*  MG  --  2.1 1.8   GFR: Estimated Creatinine Clearance: 67 mL/min (by C-G formula based on SCr of 0.9 mg/dL). Liver Function Tests: Recent Labs  Lab 12/30/21 1847  AST 21  ALT 10  ALKPHOS 67  BILITOT 0.7  PROT 7.5  ALBUMIN 3.7   No results for input(s): "LIPASE", "AMYLASE" in the last 168 hours. No results for input(s): "AMMONIA" in the last 168 hours. Coagulation Profile: Recent Labs  Lab 12/31/21 0040  INR 1.5*   Cardiac Enzymes: No results for input(s): "CKTOTAL", "CKMB", "CKMBINDEX", "TROPONINI" in the last 168 hours. BNP (last 3 results) No results for input(s): "PROBNP" in the last 8760 hours. HbA1C: No results for input(s): "HGBA1C" in the last 72 hours. CBG: No results for input(s): "GLUCAP" in the last 168 hours. Lipid Profile: No results for input(s): "CHOL", "HDL", "LDLCALC", "TRIG", "CHOLHDL", "LDLDIRECT" in the last 72 hours. Thyroid Function Tests: No results for input(s): "TSH", "T4TOTAL", "FREET4", "T3FREE", "THYROIDAB" in the last 72 hours. Anemia Panel: No results for input(s): "VITAMINB12", "FOLATE", "FERRITIN", "TIBC", "IRON", "RETICCTPCT" in the last 72 hours. Sepsis Labs: No results for input(s): "PROCALCITON", "LATICACIDVEN" in the last 168 hours.  Recent Results (from the past 240 hour(s))  Resp Panel by RT-PCR (Flu A&B, Covid) Anterior Nasal Swab     Status: None   Collection Time: 12/30/21 11:12 PM   Specimen: Anterior Nasal Swab  Result Value Ref Range Status   SARS Coronavirus 2 by RT PCR NEGATIVE NEGATIVE Final    Comment: (NOTE) SARS-CoV-2 target nucleic acids are NOT DETECTED.  The SARS-CoV-2 RNA is generally detectable in upper respiratory specimens during the acute phase of  infection. The lowest concentration of SARS-CoV-2 viral copies this assay can detect is 138 copies/mL. A negative result does not preclude SARS-Cov-2 infection and should not be used as the sole basis for treatment or other patient management decisions. A negative result may occur with  improper specimen collection/handling, submission of specimen other than nasopharyngeal swab, presence of viral mutation(s) within the areas targeted by this assay, and inadequate number of viral copies(<138 copies/mL). A negative result must be combined with clinical observations, patient history, and epidemiological information. The expected result is Negative.  Fact Sheet for Patients:  EntrepreneurPulse.com.au  Fact Sheet for Healthcare Providers:  IncredibleEmployment.be  This test is no t yet approved or cleared by the [place] FDA and  has been authorized for detection and/or diagnosis of SARS-CoV-2 by FDA under an Emergency Use Authorization (EUA). This EUA will remain  in effect (meaning this test can be used) for the duration of the COVID-19 declaration under Section 564(b)(1) of the Act, 21 U.S.C.section 360bbb-3(b)(1), unless  the authorization is terminated  or revoked sooner.       Influenza A by PCR NEGATIVE NEGATIVE Final   Influenza B by PCR NEGATIVE NEGATIVE Final    Comment: (NOTE) The Xpert Xpress SARS-CoV-2/FLU/RSV plus assay is intended as an aid in the diagnosis of influenza from Nasopharyngeal swab specimens and should not be used as a sole basis for treatment. Nasal washings and aspirates are unacceptable for Xpert Xpress SARS-CoV-2/FLU/RSV testing.  Fact Sheet for Patients: BloggerCourse.com  Fact Sheet for Healthcare Providers: SeriousBroker.it  This test is not yet approved or cleared by the [place] FDA and has been authorized for detection and/or diagnosis of SARS-CoV-2  by FDA under an Emergency Use Authorization (EUA). This EUA will remain in effect (meaning this test can be used) for the duration of the COVID-19 declaration under Section 564(b)(1) of the Act, 21 U.S.C. section 360bbb-3(b)(1), unless the authorization is terminated or revoked.  Performed at [facility], [address] [place], [place] [address]          Radiology Studies: PERIPHERAL VASCULAR CATHETERIZATION  Result Date: 01/01/2022 See surgical note for result.  ECHOCARDIOGRAM COMPLETE  Result Date: 01/01/2022    ECHOCARDIOGRAM REPORT   Patient Name:   AYNSLEY FLEET Date of Exam: 01/01/2022 Medical Rec #:  [id number]           Height:       66.0 in Accession #:    [id number]          Weight:       200.0 lb Date of Birth:  [birth year]           BSA:          2.000 m Patient Age:    71 years            BP:           136/83 mmHg Patient Gender: F                   HR:           75 bpm. Exam Location:  [facility] Procedure: 2D Echo, Cardiac Doppler and Color Doppler Indications:     CHF-acute diastolic I50.31  History:         Patient has prior history of Echocardiogram examinations, most                  recent 01/01/2020. Signs/Symptoms:Dyspnea; Risk                  Factors:Hypertension. Pulmonary embolism.  Sonographer:     Cristela Blue Referring Phys:  [id number] Andris Baumann Diagnosing Phys: Julien Nordmann MD  Sonographer Comments: Technically challenging study due to limited acoustic windows, no apical window and no subcostal window. IMPRESSIONS  1. Left ventricular ejection fraction, by estimation, is 55 to 60%. The left ventricle has normal function. The left ventricle has no regional wall motion abnormalities. Left ventricular diastolic parameters are indeterminate.  2. Right ventricular systolic function is normal. The right ventricular size is normal. There is moderately elevated pulmonary artery systolic pressure. The estimated right ventricular systolic pressure is 46.7 mmHg.  3.  The mitral valve is normal in structure. No evidence of mitral valve regurgitation. No evidence of mitral stenosis.  4. The aortic valve was not well visualized. Aortic valve regurgitation is not visualized. No aortic stenosis is present.  5. The inferior vena cava is normal in size with greater than 50% respiratory variability, suggesting right  atrial pressure of 3 mmHg. FINDINGS  Left Ventricle: Left ventricular ejection fraction, by estimation, is 55 to 60%. The left ventricle has normal function. The left ventricle has no regional wall motion abnormalities. The left ventricular internal cavity size was normal in size. There is  no left ventricular hypertrophy. Left ventricular diastolic parameters are indeterminate. Right Ventricle: The right ventricular size is normal. No increase in right ventricular wall thickness. Right ventricular systolic function is normal. There is moderately elevated pulmonary artery systolic pressure. The tricuspid regurgitant velocity is 3.23 m/s, and with an assumed right atrial pressure of 5 mmHg, the estimated right ventricular systolic pressure is 123456 mmHg. Left Atrium: Left atrial size was normal in size. Right Atrium: Right atrial size was normal in size. Pericardium: There is no evidence of pericardial effusion. Mitral Valve: The mitral valve is normal in structure. Mild mitral annular calcification. No evidence of mitral valve regurgitation. No evidence of mitral valve stenosis. Tricuspid Valve: The tricuspid valve is normal in structure. Tricuspid valve regurgitation is mild . No evidence of tricuspid stenosis. Aortic Valve: The aortic valve was not well visualized. Aortic valve regurgitation is not visualized. No aortic stenosis is present. Pulmonic Valve: The pulmonic valve was normal in structure. Pulmonic valve regurgitation is not visualized. No evidence of pulmonic stenosis. Aorta: The aortic root is normal in size and structure. Venous: The inferior vena cava is  normal in size with greater than 50% respiratory variability, suggesting right atrial pressure of 3 mmHg. IAS/Shunts: No atrial level shunt detected by color flow Doppler.  LEFT VENTRICLE PLAX 2D LVIDd:         4.40 cm LVIDs:         2.90 cm LV PW:         1.00 cm LV IVS:        1.10 cm LVOT diam:     2.10 cm LVOT Area:     3.46 cm  LEFT ATRIUM         Index LA diam:    3.70 cm 1.85 cm/m   AORTA Ao Root diam: 3.30 cm TRICUSPID VALVE TR Peak grad:   41.7 mmHg TR Vmax:        323.00 cm/s  SHUNTS Systemic Diam: 2.10 cm Ida Rogue MD Electronically signed by Ida Rogue MD Signature Date/Time: 01/01/2022/10:09:55 AM    Final         Scheduled Meds:  docusate sodium  100 mg Oral BID   sodium chloride flush  3 mL Intravenous Q12H   Continuous Infusions:  sodium chloride     heparin 1,100 Units/hr (01/02/22 0545)     LOS: 2 days   Time spent= 35 mins    Juana Montini Arsenio Loader, MD [facility]  If 7PM-7AM, please contact night-coverage  01/02/2022, 10:50 AM

## 2022-01-02 NOTE — Plan of Care (Signed)

## 2022-01-03 LAB — BASIC METABOLIC PANEL
Anion gap: 5 (ref 5–15)
BUN: 14 mg/dL (ref 8–23)
CO2: 22 mmol/L (ref 22–32)
Calcium: 8.8 mg/dL — ABNORMAL LOW (ref 8.9–10.3)
Chloride: 111 mmol/L (ref 98–111)
Creatinine, Ser: 1.06 mg/dL — ABNORMAL HIGH (ref 0.44–1.00)
GFR, Estimated: 57 mL/min — ABNORMAL LOW (ref >60)
Glucose, Bld: 98 mg/dL (ref 70–99)
Potassium: 4.4 mmol/L (ref 3.5–5.1)
Sodium: 138 mmol/L (ref 135–145)

## 2022-01-03 LAB — CBC
HCT: 29.3 % — ABNORMAL LOW (ref 36.0–46.0)
Hemoglobin: 9.3 g/dL — ABNORMAL LOW (ref 12.0–15.0)
MCH: 26.5 pg (ref 26.0–34.0)
MCHC: 31.7 g/dL (ref 30.0–36.0)
MCV: 83.5 fL (ref 80.0–100.0)
Platelets: 184 10*3/uL (ref 150–400)
RBC: 3.51 MIL/uL — ABNORMAL LOW (ref 3.87–5.11)
RDW: 15.7 % — ABNORMAL HIGH (ref 11.5–15.5)
WBC: 7.7 10*3/uL (ref 4.0–10.5)
nRBC: 0 % (ref 0.0–0.2)

## 2022-01-03 LAB — HEPARIN LEVEL (UNFRACTIONATED): Heparin Unfractionated: 0.59 IU/mL (ref 0.30–0.70)

## 2022-01-03 LAB — MAGNESIUM: Magnesium: 2 mg/dL (ref 1.7–2.4)

## 2022-01-03 MED ORDER — POLYETHYLENE GLYCOL 3350 17 G PO PACK
17.0000 g | PACK | Freq: Every day | ORAL | Status: DC
  Administered 2022-01-03 – 2022-01-04 (×2): 17 g via ORAL
  Filled 2022-01-03 (×2): qty 1

## 2022-01-03 NOTE — TOC Initial Note (Signed)
Transition of Care [facility]) - Initial/Assessment Note    Patient Details  Name: Leah Roberts MRN: [id number] Date of Birth: 1951-01-11  [facility] CM/SW Contact:    Gildardo Griffes, LCSW Phone Number: 01/03/2022, 10:14 AM  Clinical Narrative:                  Per chart review of recent readmission,  patient is from home with her sister. Has three adult children that are out of state. Her son came down from [place] to assist. Patient is originally from [place], awaiting her [employer]. Does not have medical insurance.  Patient has hx of home health PT through [facility] with [facility] and received RW through [employer] [facility] last  admission.   Son has secured personal care services (PCS) from [employer] through his benefits from his job.   Son to transport at discharge.  TOC will continue to follow for dc planning needs.   Expected Discharge Plan:  (TBD) Barriers to Discharge: Continued Medical Work up   Patient Goals and CMS Choice Patient states their goals for this hospitalization and ongoing recovery are:: to go home CMS Medicare.gov Compare Post Acute Care list provided to:: Patient Choice offered to / list presented to : Patient  Expected Discharge Plan and Services Expected Discharge Plan:  (TBD)       Living arrangements for the past 2 months: Single Family Home                                      Prior Living Arrangements/Services Living arrangements for the past 2 months: Single Family Home Lives with:: Relatives                   Activities of Daily Living Home Assistive Devices/Equipment: Cane (specify quad or straight) ADL Screening (condition at time of admission) Patient's cognitive ability adequate to safely complete daily activities?: Yes Is the patient deaf or have difficulty hearing?: No Does the patient have difficulty seeing, even when wearing glasses/contacts?: No Does the patient have difficulty concentrating,  remembering, or making decisions?: No Patient able to express need for assistance with ADLs?: Yes Does the patient have difficulty dressing or bathing?: No Independently performs ADLs?: Yes (appropriate for developmental age) Does the patient have difficulty walking or climbing stairs?: No Weakness of Legs: None Weakness of Arms/Hands: None  Permission Sought/Granted                  Emotional Assessment       Orientation: : Oriented to Self, Oriented to Place, Oriented to  Time, Oriented to Situation Alcohol / Substance Use: Not Applicable Psych Involvement: No (comment)  Admission diagnosis:  Shortness of breath [R06.02] PE (pulmonary thromboembolism) (HCC) [I26.99] Bilateral pulmonary embolism (HCC) [I26.99] Acute respiratory failure with hypoxia (HCC) [J96.01] Patient Active Problem List   Diagnosis Date Noted   Bilateral pulmonary embolism with right heart strain RV/LV ratio 1:5 (HCC) 12/31/2021   Acute respiratory failure with hypoxia (HCC) 12/31/2021   Total knee replacement status, left 12/09/21 12/09/2021   Unilateral primary osteoarthritis, left knee    Shoulder disorder 09/03/2021   Arthritis of knee 09/03/2021   Lumbar radiculopathy 09/03/2021   Abnormal MRI, knee 09/03/2021   Chronic pain of right knee 09/03/2021   Bronchitis 03/25/2021   Anemia 01/09/2020   HTN (hypertension) 12/31/2019   Chest pain 12/31/2019   DOE (dyspnea on  exertion) 12/31/2019   Acute coronary syndrome (HCC) 12/31/2019   Unstable angina (HCC) 12/31/2019   Atherosclerosis 08/23/2018   Screening for tuberculosis 07/12/2018   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 [place] [address] Phone: [phone] Fax: [phone]  [facility] [address] [place] [address] Phone: [phone] Fax: [phone]  [facility] - CLOSED PERMANENTLY [address] [place] [address] Phone: [phone] Fax: [phone]     Social Determinants of Health (SDOH) Interventions    Readmission Risk Interventions     No data to display

## 2022-01-03 NOTE — Progress Notes (Signed)
ANTICOAGULATION CONSULT NOTE  Pharmacy Consult for Heparin infusion Indication: acute submassive PE  Patient Measurements: Height: 5\' 6"  (167.6 cm) Weight: 90.7 kg (199 lb 15.3 oz) IBW/kg (Calculated) : 59.3 Heparin Dosing Weight: 79.1 kg   Labs: Recent Labs    01/01/22 0506 01/01/22 2320 01/02/22 0132 01/02/22 1223 01/02/22 1758 01/03/22 0029  HGB 10.0*  --  9.6*  --   --  9.3*  HCT 32.5*  --  30.2*  --   --  29.3*  PLT 246  --  207  --   --  184  APTT 30 150* 174* 93*  --   --   HEPARINUNFRC 0.23* 1.04*  --  0.68 0.68 0.59  CREATININE 0.96  --  0.90  --   --  1.06*     Estimated Creatinine Clearance: 56.9 mL/min (A) (by C-G formula based on SCr of 1.06 mg/dL (H)).  Medical History: Past Medical History:  Diagnosis Date   Arthritis    DVT (deep venous thrombosis) (HCC) 2013   Dyspnea    due to knee pain   Family history of adverse reaction to anesthesia    Brother and sister hard to wake up.   GERD (gastroesophageal reflux disease)    History of blood transfusion    History of hypertension    Hypertension    Peripheral vascular disease (HCC)    DVT   Pre-diabetes    Pulmonary embolism [facility]) 2013   Assessment: Leah Roberts is a 71 year old female admitted with PE. PMH significant for history of DVT (2/2 16 hr flight in 2014, on eliquis), arthritis, GERD, HTN, PVD, pre-diabetes. Patient on Eliquis 5 mg PO BID with last dose taken in ED lobby 8/30 @ ~2300. 8/30 CTA chest positve for acute PE with CT evidence of right heart strain (RV/LV Ratio = 1.5) consistent with at least submassive (intermediate risk) PE. 8/31 venous 9/31 negative for DVT in either lower extremity. Pharmacy has been consulted to initiate and manage heparin infusion.  Baseline Labs: aPTT 32, HL >1.10, PT 18.2, INR 1.5, Hgb 11.2, Hct 35.7, Plt 275  Goal of Therapy:  Heparin level 0.3-0.7 units/ml aPTT 66 - 102  seconds Monitor platelets by anticoagulation protocol: Yes  Monitoring   Date Time HL/aPTT Rate/Comment  8/31 0838 >1.10/58 SUBtherapeutic 8/31 1700 N/A / 84 Therapeutic x 1 8/31     2309         71              Therapeutic X 2  9/01     0506    0.23, 30          SUBtherapeutic 9/01     2320     150                Elevated after restart, lab error ? 9/02     0132     174                Elevated (repeat previous level) -RN reports pt oozing blood at puncture site.  Vascular advised to hold heparin drip for additional hour (2 hrs)so will restart around 0530.  9/02 1223 0.68/93 9/02     1758    0.68 9/02     0029    0.59, therapeutic X 3    Plan:  09/03:  HL @ 0029 = 0.59, therapeutic X 3 - will continue pt on current rate and recheck HL on 9/4 with AM labs. -  will recheck CBC on 9/03 with AM labs.   CBC daily  Thank you for allowing pharmacy to be a part of this patient's care.  Mikael Debell D 01/03/2022 1:06 AM

## 2022-01-03 NOTE — Progress Notes (Signed)
PROGRESS NOTE    Leah Roberts  NUU:[id number] DOB: Jun 04, 1950 DOA: 12/30/2021 PCP: Allegra Grana, FNP   Brief Narrative:  71 y.o. female with medical history significant for DVT in 2013 following a 16-hour plane flight followed by a PE in 2014, currently on Eliquis and who underwent left total knee replacement on 12/09/2021 who presents to the ED with a 2-day history of sharp left-sided chest pain and shortness of breath, worse with exertion.  She reports having missed a dose or 2 of Eliquis a few days prior otherwise has been compliant.  She has been on Eliquis for the past 2 years and previously was on Coumadin.  CTA chest was positive for acute PE with CT evidence of right heart strain consistent with at least submassive PE, RV LV ratio 1.5. She was started on Heparin Drip.  She was seen by vascular and underwent thrombectomy on 01/01/2022.  Following day had some bleeding.   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 Sueanne Margarita.  She has been compliant with Eliquis except when it was held for left knee total arthroplasty on 8/9.  This is not considered failure of Eliquis.  Discussed with Dr. Orlie Dakin who is in agreement with the plan.  Hemodynamically stable at this time.  Does have elevated troponins and BNP. - Echocardiogram-EF 60%. - Lower extremity Dopplers-negative - Continue heparin drip, eventually transition back to Eliquis - Status  post thrombectomy by vascular on 9/1.  Postop today she is having bleeding from right groin.  She is still having significant amount of groin pain.  Ultrasound has been done, read is still pending.  Acute respiratory failure with hypoxia (HCC) Psych due to pulmonary embolism, supplemental oxygen.  Essential hypertension -Not on home meds  GERD -Not on home meds   DVT prophylaxis: Subcu heparin Code Status: Full code Family Communication:    Status is: Inpatient Remains inpatient appropriate because: Bleeding from her right groin has subsided but still having significant amount of discomfort.  Still waiting for ultrasound to result thereafter we can consider transitioning over to oral Eliquis.  Subjective: Right groin pain/discomfort this morning.  Bleeding is subsided.  Examination: Constitutional: Not in acute distress Respiratory: Clear to auscultation bilaterally Cardiovascular: Normal sinus rhythm, no rubs Abdomen: Nontender nondistended good bowel sounds Musculoskeletal: No edema noted Skin: Her right groin is tender to touch around the dressing site.  Some hematoma felt.  No obvious evidence of active bleeding at this time. Neurologic: CN 2-12 grossly intact.  And nonfocal Psychiatric: Normal judgment and insight. Alert and oriented x 3. Normal mood.  Mood. Objective: Vitals:   01/02/22 1935 01/03/22 0033 01/03/22 0414 01/03/22 0906  BP: 122/70 116/80 126/78 116/75  Pulse: 84 80 83 74  Resp: 20 18 19 17   Temp: 98.7 F (37.1 C) 99.4 F (37.4 C) 98.5 F (36.9 C) 97.6 F (36.4 C)  TempSrc:  Oral Oral   SpO2: 100% 96% 92% 95%  Weight:      Height:        Intake/Output Summary (Last 24 hours) at 01/03/2022 1251 Last data filed at 01/03/2022 0600 Gross per 24 hour  Intake 502.01 ml  Output 600 ml  Net -97.99 ml   Filed Weights   12/30/21 1822  Weight: 90.7 kg     Data Reviewed:   CBC: Recent Labs  Lab 12/30/21 1847 12/31/21 0838 01/01/22 0506  01/02/22 0132 01/03/22 0029  WBC 8.0 6.4 6.2 8.5 7.7  HGB 11.2* 10.5* 10.0* 9.6* 9.3*  HCT 35.8* 34.1* 32.5* 30.2* 29.3*  MCV 83.8 85.5 84.4 83.4 83.5  PLT 275 253 246 207 184   Basic Metabolic Panel: Recent Labs  Lab 12/30/21 1847 01/01/22 0506 01/02/22 0132 01/03/22 0029  NA 139 141 137 138  K 4.0 4.4 4.0 4.4  CL 110 112* 112* 111  CO2 19* 26 22 22   GLUCOSE 121* 90 96 98  BUN 13 13 14 14   CREATININE 0.87 0.96 0.90 1.06*  CALCIUM 9.4 8.8* 8.1* 8.8*  MG  --  2.1 1.8 2.0   GFR: Estimated Creatinine Clearance: 56.9 mL/min (A) (by C-G formula based on SCr of 1.06 mg/dL (H)). Liver Function Tests: Recent Labs  Lab 12/30/21 1847  AST 21  ALT 10  ALKPHOS 67  BILITOT 0.7  PROT 7.5  ALBUMIN 3.7   No results for input(s): "LIPASE", "AMYLASE" in the last 168 hours. No results for input(s): "AMMONIA" in the last 168 hours. Coagulation Profile: Recent Labs  Lab 12/31/21 0040  INR 1.5*   Cardiac Enzymes: No results for input(s): "CKTOTAL", "CKMB", "CKMBINDEX", "TROPONINI" in the last 168 hours. BNP (last 3 results) No results for input(s): "PROBNP" in the last 8760 hours. HbA1C: No results for input(s): "HGBA1C" in the last 72 hours. CBG: No results for input(s): "GLUCAP" in the last 168 hours. Lipid Profile: No results for input(s): "CHOL", "HDL", "LDLCALC", "TRIG", "CHOLHDL", "LDLDIRECT" in the last 72 hours. Thyroid Function Tests: No results for input(s): "TSH", "T4TOTAL", "FREET4", "T3FREE", "THYROIDAB" in the last 72 hours. Anemia Panel: No results for input(s): "VITAMINB12", "FOLATE", "FERRITIN", "TIBC", "IRON", "RETICCTPCT" in the last 72 hours. Sepsis Labs: No results for input(s): "PROCALCITON", "LATICACIDVEN" in the last 168 hours.  Recent Results (from the past 240 hour(s))  Resp Panel by RT-PCR (Flu A&B, Covid) Anterior Nasal Swab     Status: None   Collection Time: 12/30/21 11:12 PM   Specimen: Anterior Nasal Swab  Result Value Ref Range Status    SARS Coronavirus 2 by RT PCR NEGATIVE NEGATIVE Final    Comment: (NOTE) SARS-CoV-2 target nucleic acids are NOT DETECTED.  The SARS-CoV-2 RNA is generally detectable in upper respiratory specimens during the acute phase of infection. The lowest concentration of SARS-CoV-2 viral copies this assay can detect is 138 copies/mL. A negative result does not preclude SARS-Cov-2 infection and should not be used as the sole basis for treatment or other patient management decisions. A negative result may occur with  improper specimen collection/handling, submission of specimen other than nasopharyngeal swab, presence of viral mutation(s) within the areas targeted by this assay, and inadequate number of viral copies(<138 copies/mL). A negative result must be combined with clinical observations, patient history, and epidemiological information. The expected result is Negative.  Fact Sheet for Patients:  01/02/22  Fact Sheet for Healthcare Providers:  01/01/22  This test is no t yet approved or cleared by the [employer] FDA and  has been authorized for detection and/or  diagnosis of SARS-CoV-2 by FDA under an Emergency Use Authorization (EUA). This EUA will remain  in effect (meaning this test can be used) for the duration of the COVID-19 declaration under Section 564(b)(1) of the Act, 21 U.S.C.section 360bbb-3(b)(1), unless the authorization is terminated  or revoked sooner.       Influenza A by PCR NEGATIVE NEGATIVE Final   Influenza B by PCR NEGATIVE NEGATIVE Final    Comment: (NOTE) The Xpert Xpress SARS-CoV-2/FLU/RSV plus assay is intended as an aid in the diagnosis of influenza from Nasopharyngeal swab specimens and should not be used as a sole basis for treatment. Nasal washings and aspirates are unacceptable for Xpert Xpress SARS-CoV-2/FLU/RSV testing.  Fact Sheet for  Patients: [employer]  Fact Sheet for Healthcare Providers: [employer]  This test is not yet approved or cleared by the [place] FDA and has been authorized for detection and/or diagnosis of SARS-CoV-2 by FDA under an Emergency Use Authorization (EUA). This EUA will remain in effect (meaning this test can be used) for the duration of the COVID-19 declaration under Section 564(b)(1) of the Act, 21 U.S.C. section 360bbb-3(b)(1), unless the authorization is terminated or revoked.  Performed at [facility], [address], [place], [place] [address]          Radiology Studies: PERIPHERAL VASCULAR CATHETERIZATION  Result Date: 01/01/2022 See surgical note for result.       Scheduled Meds:  docusate sodium  100 mg Oral BID   polyethylene glycol  17 g Oral Daily   sodium chloride flush  3 mL Intravenous Q12H   Continuous Infusions:  sodium chloride     heparin 1,050 Units/hr (01/02/22 1431)     LOS: 3 days   Time spent= 35 mins    Camyla Camposano Joline Maxcy, MD [facility]  If 7PM-7AM, please contact night-coverage  01/03/2022, 12:51 PM

## 2022-01-04 ENCOUNTER — Other Ambulatory Visit: Payer: Self-pay

## 2022-01-04 LAB — CBC
HCT: 29.3 % — ABNORMAL LOW (ref 36.0–46.0)
Hemoglobin: 9.1 g/dL — ABNORMAL LOW (ref 12.0–15.0)
MCH: 25.9 pg — ABNORMAL LOW (ref 26.0–34.0)
MCHC: 31.1 g/dL (ref 30.0–36.0)
MCV: 83.5 fL (ref 80.0–100.0)
Platelets: 182 10*3/uL (ref 150–400)
RBC: 3.51 MIL/uL — ABNORMAL LOW (ref 3.87–5.11)
RDW: 15.8 % — ABNORMAL HIGH (ref 11.5–15.5)
WBC: 8.1 10*3/uL (ref 4.0–10.5)
nRBC: 0 % (ref 0.0–0.2)

## 2022-01-04 LAB — BASIC METABOLIC PANEL
Anion gap: 6 (ref 5–15)
BUN: 15 mg/dL (ref 8–23)
CO2: 19 mmol/L — ABNORMAL LOW (ref 22–32)
Calcium: 8.5 mg/dL — ABNORMAL LOW (ref 8.9–10.3)
Chloride: 110 mmol/L (ref 98–111)
Creatinine, Ser: 0.96 mg/dL (ref 0.44–1.00)
GFR, Estimated: >60 mL/min (ref >60)
Glucose, Bld: 101 mg/dL — ABNORMAL HIGH (ref 70–99)
Potassium: 4.1 mmol/L (ref 3.5–5.1)
Sodium: 135 mmol/L (ref 135–145)

## 2022-01-04 LAB — HEPARIN LEVEL (UNFRACTIONATED): Heparin Unfractionated: 0.46 IU/mL (ref 0.30–0.70)

## 2022-01-04 LAB — MAGNESIUM: Magnesium: 2 mg/dL (ref 1.7–2.4)

## 2022-01-04 MED ORDER — APIXABAN 5 MG PO TABS
ORAL_TABLET | ORAL | 0 refills | Status: DC
  Filled 2022-01-04: qty 74, 30d supply, fill #0

## 2022-01-04 MED ORDER — OXYCODONE-ACETAMINOPHEN 5-325 MG PO TABS
1.0000 | ORAL_TABLET | Freq: Four times a day (QID) | ORAL | 0 refills | Status: DC | PRN
Start: 2022-01-04 — End: 2022-01-08
  Filled 2022-01-04: qty 15, 4d supply, fill #0

## 2022-01-04 MED ORDER — SENNOSIDES-DOCUSATE SODIUM 8.6-50 MG PO TABS
1.0000 | ORAL_TABLET | Freq: Every evening | ORAL | 0 refills | Status: DC | PRN
  Filled 2022-01-04: qty 30, 30d supply, fill #0

## 2022-01-04 MED ORDER — APIXABAN 5 MG PO TABS
10.0000 mg | ORAL_TABLET | Freq: Two times a day (BID) | ORAL | Status: DC
  Administered 2022-01-04: 10 mg via ORAL
  Filled 2022-01-04: qty 2

## 2022-01-04 MED ORDER — POLYETHYLENE GLYCOL 3350 17 G PO PACK
17.0000 g | PACK | Freq: Every day | ORAL | 0 refills | Status: DC
  Filled 2022-01-04: qty 14, 14d supply, fill #0

## 2022-01-04 MED ORDER — APIXABAN 5 MG PO TABS: 5.0000 mg | ORAL_TABLET | Freq: Two times a day (BID) | ORAL | Status: DC

## 2022-01-04 NOTE — Progress Notes (Signed)
ANTICOAGULATION CONSULT NOTE  Pharmacy Consult for Apixaban Indication: acute submassive PE  Patient Measurements: Height: 5\' 6"  (167.6 cm) Weight: 90.7 kg (199 lb 15.3 oz) IBW/kg (Calculated) : 59.3 Heparin Dosing Weight: 79.1 kg   Labs: Recent Labs    01/01/22 2320 01/01/22 2320 01/02/22 0132 01/02/22 1223 01/02/22 1758 01/03/22 0029 01/04/22 0413  HGB  --    < > 9.6*  --   --  9.3* 9.1*  HCT  --   --  30.2*  --   --  29.3* 29.3*  PLT  --   --  207  --   --  184 182  APTT 150*  --  174* 93*  --   --   --   HEPARINUNFRC 1.04*  --   --  0.68 0.68 0.59 0.46  CREATININE  --   --  0.90  --   --  1.06* 0.96   < > = values in this interval not displayed.     Estimated Creatinine Clearance: 62.8 mL/min (by C-G formula based on SCr of 0.96 mg/dL).  Medical History: Past Medical History:  Diagnosis Date   Arthritis    DVT (deep venous thrombosis) (HCC) 2013   Dyspnea    due to knee pain   Family history of adverse reaction to anesthesia    Brother and sister hard to wake up.   GERD (gastroesophageal reflux disease)    History of blood transfusion    History of hypertension    Hypertension    Peripheral vascular disease (HCC)    DVT   Pre-diabetes    Pulmonary embolism [facility]) 2013   Assessment: Leah Roberts is a 71 year old female admitted with PE. PMH significant for history of DVT (2/2 16 hr flight in 2014, on eliquis), arthritis, GERD, HTN, PVD, pre-diabetes. Patient on Eliquis 5 mg PO BID with last dose taken in ED lobby 8/30 @ ~2300. 8/30 CTA chest positve for acute PE with CT evidence of right heart strain (RV/LV Ratio = 1.5) consistent with at least submassive (intermediate risk) PE. 8/31 venous 9/31 negative for DVT in either lower extremity. Patient underwent thrombectomy by vascular on 9/1. Pharmacy has been consulted to transition from heparin infusion to apixaban.  Baseline Labs: aPTT 32, HL >1.10, PT 18.2, INR 1.5, Hgb 11.2, Hct 35.7, Plt 275  Goal of  Therapy: Therapeutic anticoagulation with apixaban  Monitoring     Plan:  Discontinue heparin infusion. Start apixaban 10 mg BID x7 days (9/4-9/10) then switch to apixaban 5 mg BID thereafter (starting 9/11). Pharmacy will sign off.  Thank you for allowing pharmacy to be a part of this patient's care.  11/11, PharmD PGY1 Pharmacy Resident 01/04/2022 11:20 AM

## 2022-01-04 NOTE — Discharge Summary (Signed)
Physician Discharge Summary  Natosha Bou EXB:[id number] DOB: 30-Mar-1951 DOA: 12/30/2021  PCP: Allegra Grana, FNP  Admit date: 12/30/2021 Discharge date: 01/04/2022  Admitted From: Home Disposition:  Home  Recommendations for Outpatient Follow-up:  Follow up with PCP in 1-2 weeks Please obtain BMP/CBC in one week your next doctors visit.  Follow up outpatient with Dr Orlie Dakin in the next 2-3 weeks Eliquis reloaded per pharmacy, 10mg  po bid x 7 days followed by 5mg  po bid.  Pain meds with Bowel regimen.    Discharge Condition: Stable CODE STATUS: Full  Diet recommendation: Regular.   Brief/Interim Summary: 71 y.o. female with medical history significant for DVT in 2013 following a 16-hour plane flight followed by a PE in 2014, currently on Eliquis and who underwent left total knee replacement on 12/09/2021 who presents to the ED with a 2-day history of sharp left-sided chest pain and shortness of breath, worse with exertion.  She reports having missed a dose or 2 of Eliquis a few days prior otherwise has been compliant.  She has been on Eliquis for the past 2 years and previously was on Coumadin.  CTA chest was positive for acute PE with CT evidence of right heart strain consistent with at least submassive PE, RV LV ratio 1.5. She was started on Heparin Drip.  She was seen by vascular and underwent thrombectomy on 01/01/2022.  Following day had some bleeding and the ultrasound was negative for pseudoaneurysm.  It showed some hematoma.  Hemoglobin remained stable.  Today she is medically stable for discharge, transition to oral Eliquis.  Rest of the recommendations as mentioned above.     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 2014.  She has been compliant with Eliquis except when it was held for left knee total arthroplasty on 8/9.  This is not considered failure of Eliquis.  Discussed with Dr. Sueanne Margarita who is in agreement with the plan.  Hemodynamically stable at this time.  Does have elevated troponins and BNP.  Echocardiogram showed EF of 60%, lower extremity Dopplers negative for DVT.  Status post thrombectomy by vascular 9/1 which she tolerated well.  Her breathing symptoms are much better.  Postoperatively had some complication with right groin bleeding and pain.  Ultrasound was negative for any pseudoaneurysm.  There was a small hematoma but hemoglobin remained stable.  Plan is to transition her back to Eliquis today with loading dose 10 mg twice daily for 7 days followed by 5 mg twice daily.   Acute respiratory failure with hypoxia [facility]) Resolved   Essential hypertension -Not on home meds   GERD -Not on home meds       Body mass index is 32.27 kg/m.       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  Breathing has significantly improved.  Her right groin pain is also better.  Patient is ambulatory without any issues.  Had a large bowel movement yesterday as well  Discharge Exam: Vitals:   01/04/22 0837 01/04/22 1157  BP: 102/67 107/73  Pulse: 76 78  Resp: 18 18  Temp: 98.1 F (36.7 C) 98.4 F  (36.9 C)  SpO2: 94% 91%   Vitals:   01/04/22 0017 01/04/22 0400 01/04/22 0837 01/04/22 1157  BP: 115/71 116/75 102/67 107/73  Pulse: 78 82 76 78  Resp: 20 19 18 18   Temp: 98.9 F (37.2 C) 97.7 F (36.5 C) 98.1 F (36.7 C) 98.4 F (36.9 C)  TempSrc: Oral Oral    SpO2: 99% 92% 94% 91%  Weight:      Height:        General: Pt is alert, awake, not in acute distress Cardiovascular: RRR, S1/S2 +, no rubs, no gallops Respiratory: CTA bilaterally, no wheezing, no rhonchi Abdominal: Soft, NT, ND, bowel sounds + Extremities: no edema, no cyanosis  Discharge Instructions   Allergies as of 01/04/2022       Reactions   Sulfa Antibiotics Swelling, Rash        Medication List     TAKE these medications    apixaban 5 MG Tabs tablet Commonly known as: ELIQUIS Take 2 tablets (10 mg total) by mouth 2 (two) times daily for 7 days, THEN 1 tablet (5 mg total) 2 (two) times daily for 23 days. Start taking on: January 04, 2022 What changed: See the new instructions.   BIOFREEZE EX Apply 1 Application topically daily as needed (leg pain).   calcium carbonate 500 MG chewable tablet Commonly known as: TUMS - dosed in mg elemental calcium Chew 1 tablet by mouth daily as needed for indigestion or heartburn.   lidocaine 5 % Commonly known as: LIDODERM Place 1 patch onto the skin every 12 (twelve) hours. Remove & Discard patch within 12 hours or as directed by MD   loratadine 10 MG tablet Commonly known as: CLARITIN Take 10 mg by mouth daily.   methocarbamol 500 MG tablet Commonly known as: ROBAXIN Take 1 tablet (500 mg total) by mouth every 8 (eight) hours as needed for muscle spasms.   multivitamin with minerals Tabs tablet Take 1 tablet by mouth 2 (two) times a week.   oxyCODONE-acetaminophen 5-325 MG tablet Commonly known as: PERCOCET/ROXICET Take 1 tablet by mouth every 6 (six) hours as needed for severe pain. What changed:  when to take this reasons to take this    polyethylene glycol 17 g packet Commonly known as: MIRALAX / GLYCOLAX Take 17 g by mouth daily. Start taking on: January 05, 2022   senna-docusate 8.6-50 MG tablet Commonly known as: Senokot-S Take 1 tablet by mouth at bedtime as needed for moderate constipation.        Follow-up Information     Allegra Granarnett, Margaret G, FNP Follow up in 1 week(s).   Specialty: Family Medicine Contact information: [address] [place] [address] [phone]         Jeralyn RuthsFinnegan, Timothy J, MD Follow up in 2 week(s).   Specialty: Oncology Contact information: [address] [place] [id number] [phone]                Allergies  Allergen Reactions  Sulfa Antibiotics Swelling and Rash    You were cared for by a hospitalist during your hospital stay. If you have any questions about your discharge medications or the care you received while you were in the hospital after you are discharged, you can call the unit and asked to speak with the hospitalist on call if the hospitalist that took care of you is not available. Once you are discharged, your primary care physician will handle any further medical issues. Please note that no refills for any discharge medications will be authorized once you are discharged, as it is imperative that you return to your primary care physician (or establish a relationship with a primary care physician if you do not have one) for your aftercare needs so that they can reassess your need for medications and monitor your lab values.   Procedures/Studies: [place] Lower Ext Art Right Ltd  Result Date: 01/03/2022 CLINICAL DATA:  Thrombectomy performed on 01/01/2022. Right inguinal pain and swelling. Concern for pseudoaneurysm. EXAM: ULTRASOUND OF RIGHT GROIN SOFT TISSUES DOPPLER EVALUATION OF THE RIGHT INGUINAL VESSELS. TECHNIQUE: Ultrasound examination of the groin soft tissues was performed in the area of clinical concern. COMPARISON:  None  Available. FINDINGS: Two small fluid collections lie at the level of the puncture site, separate from the femoral vessels, with no internal blood flow. No evidence of a pseudoaneurysm. Normal flow noted in the common, femoral and deep femoral arteries at the level of the groin. IMPRESSION: 1. No evidence of a pseudoaneurysm. 2. Two small adjacent fluid collections in the right inguinal region, superficial to femoral vessels, consistent with small hematomas, combined size measuring 1.5 cm in length by 0.8 cm anterior to posterior. Electronically Signed   By: Amie Portland M.D.   On: 01/03/2022 16:42   PERIPHERAL VASCULAR CATHETERIZATION  Result Date: 01/01/2022 See surgical note for result.  ECHOCARDIOGRAM COMPLETE  Result Date: 01/01/2022    ECHOCARDIOGRAM REPORT   Patient Name:   AARALYN KIL Date of Exam: 01/01/2022 Medical Rec #:  [id number]           Height:       66.0 in Accession #:    [id number]          Weight:       200.0 lb Date of Birth:  May 22, 1950           BSA:          2.000 m Patient Age:    71 years            BP:           136/83 mmHg Patient Gender: F                   HR:           75 bpm. Exam Location:  [facility] Procedure: 2D Echo, Cardiac Doppler and Color Doppler Indications:     CHF-acute diastolic I50.31  History:         Patient has prior history of Echocardiogram examinations, most                  recent 01/01/2020. Signs/Symptoms:Dyspnea; Risk                  Factors:Hypertension. Pulmonary embolism.  Sonographer:     Cristela Blue Referring Phys:  [id number] Andris Baumann Diagnosing Phys: Julien Nordmann MD  Sonographer Comments: Technically challenging study due to limited acoustic windows, no apical window and no subcostal window. IMPRESSIONS  1. Left ventricular ejection fraction, by estimation, is 55 to 60%. The left ventricle has normal function. The left ventricle has no regional wall motion abnormalities. Left ventricular diastolic parameters are indeterminate.  2. Right  ventricular systolic function is normal. The right ventricular size is normal. There is moderately elevated pulmonary artery systolic pressure. The estimated right ventricular systolic pressure is 46.7 mmHg.  3. The mitral valve is normal in structure. No evidence of mitral valve regurgitation. No evidence of mitral stenosis.  4. The aortic valve was not well visualized. Aortic valve regurgitation is not visualized. No aortic stenosis is present.  5. The inferior vena cava is normal in size with greater than 50% respiratory variability, suggesting right atrial pressure of 3 mmHg. FINDINGS  Left Ventricle: Left ventricular ejection fraction, by estimation, is 55 to 60%. The left ventricle has normal function. The left ventricle has no regional wall motion abnormalities. The left ventricular internal cavity size was normal in size. There is  no left ventricular hypertrophy. Left ventricular diastolic parameters are indeterminate. Right Ventricle: The right ventricular size is normal. No increase in right ventricular wall thickness. Right ventricular systolic function is normal. There is moderately elevated pulmonary artery systolic pressure. The tricuspid regurgitant velocity is 3.23 m/s, and with an assumed right atrial pressure of 5 mmHg, the estimated right ventricular systolic pressure is 46.7 mmHg. Left Atrium: Left atrial size was normal in size. Right Atrium: Right atrial size was normal in size. Pericardium: There is no evidence of pericardial effusion. Mitral Valve: The mitral valve is normal in structure. Mild mitral annular calcification. No evidence of mitral valve regurgitation. No evidence of mitral valve stenosis. Tricuspid Valve: The tricuspid valve is normal in structure. Tricuspid valve regurgitation is mild . No evidence of tricuspid stenosis. Aortic Valve: The aortic valve was not well visualized. Aortic valve regurgitation is not visualized. No aortic stenosis is present. Pulmonic Valve: The  pulmonic valve was normal in structure. Pulmonic valve regurgitation is not visualized. No evidence of pulmonic stenosis. Aorta: The aortic root is normal in size and structure. Venous: The inferior vena cava is normal in size with greater than 50% respiratory variability, suggesting right atrial pressure of 3 mmHg. IAS/Shunts: No atrial level shunt detected by color flow Doppler.  LEFT VENTRICLE PLAX 2D LVIDd:         4.40 cm LVIDs:         2.90 cm LV PW:         1.00 cm LV IVS:        1.10 cm LVOT diam:     2.10 cm LVOT Area:     3.46 cm  LEFT ATRIUM         Index LA diam:    3.70 cm 1.85 cm/m   AORTA Ao Root diam: 3.30 cm TRICUSPID VALVE TR Peak grad:   41.7 mmHg TR Vmax:        323.00 cm/s  SHUNTS Systemic Diam: 2.10 cm Julien Nordmann MD Electronically signed by Julien Nordmann MD Signature Date/Time: 01/01/2022/10:09:55 AM    Final    US Venous Img Lower Bilateral  Result Date: 12/31/2021 CLINICAL DATA:  Recent left knee replacement presenting with chest pain and shortness of breath. EXAM: BILATERAL LOWER EXTREMITY VENOUS DOPPLER ULTRASOUND TECHNIQUE: Gray-scale sonography with graded compression, as well as color Doppler and duplex ultrasound were performed to evaluate the lower extremity deep venous systems from the level of the common femoral vein and including the common femoral, femoral, profunda femoral,  popliteal and calf veins including the posterior tibial, peroneal and gastrocnemius veins when visible. The superficial great saphenous vein was also interrogated. Spectral Doppler was utilized to evaluate flow at rest and with distal augmentation maneuvers in the common femoral, femoral and popliteal veins. COMPARISON:  None Available. FINDINGS: RIGHT LOWER EXTREMITY Common Femoral Vein: No evidence of thrombus. Normal compressibility, respiratory phasicity and response to augmentation. Saphenofemoral Junction: No evidence of thrombus. Normal compressibility and flow on color Doppler imaging. Profunda  Femoral Vein: No evidence of thrombus. Normal compressibility and flow on color Doppler imaging. Femoral Vein: No evidence of thrombus. Normal compressibility, respiratory phasicity and response to augmentation. Popliteal Vein: No evidence of thrombus. Normal compressibility, respiratory phasicity and response to augmentation. Calf Veins: No evidence of thrombus. Normal compressibility and flow on color Doppler imaging. Superficial Great Saphenous Vein: No evidence of thrombus. Normal compressibility. Venous Reflux:  None. Other Findings: A 2.7 cm x 0.6 cm x 2.5 cm anechoic structure is seen within the soft tissues of the right popliteal fossa. No abnormal flow is noted within this region on color Doppler evaluation. LEFT LOWER EXTREMITY Common Femoral Vein: No evidence of thrombus. Normal compressibility, respiratory phasicity and response to augmentation. Saphenofemoral Junction: No evidence of thrombus. Normal compressibility and flow on color Doppler imaging. Profunda Femoral Vein: No evidence of thrombus. Normal compressibility and flow on color Doppler imaging. Femoral Vein: No evidence of thrombus. Normal compressibility, respiratory phasicity and response to augmentation. Popliteal Vein: No evidence of thrombus. Normal compressibility, respiratory phasicity and response to augmentation. Calf Veins: No evidence of thrombus. Normal compressibility and flow on color Doppler imaging. Superficial Great Saphenous Vein: No evidence of thrombus. Normal compressibility. Venous Reflux:  None. Other Findings: A 3.9 cm x 1.1 cm x 2.7 cm hypoechoic structure is seen within the soft tissues of the left popliteal fossa. No abnormal flow is noted within this region on color Doppler evaluation. IMPRESSION: 1. No evidence of deep venous thrombosis within the RIGHT lower extremity or LEFT lower extremity. 2. Findings consistent with BILATERAL Baker's cysts. Electronically Signed   By: Aram Candela M.D.   On: 12/31/2021  01:29   CT Angio Chest PE W and/or Wo Contrast  Result Date: 12/31/2021 CLINICAL DATA:  Concern for pulmonary embolism. EXAM: CT ANGIOGRAPHY CHEST WITH CONTRAST TECHNIQUE: Multidetector CT imaging of the chest was performed using the standard protocol during bolus administration of intravenous contrast. Multiplanar CT image reconstructions and MIPs were obtained to evaluate the vascular anatomy. RADIATION DOSE REDUCTION: This exam was performed according to the departmental dose-optimization program which includes automated exposure control, adjustment of the mA and/or kV according to patient size and/or use of iterative reconstruction technique. CONTRAST:  83mL OMNIPAQUE IOHEXOL 350 MG/ML SOLN COMPARISON:  Chest radiograph dated 12/30/2021. CT dated 01/01/2020. FINDINGS: Cardiovascular: Top-normal cardiac size. No pericardial effusion. There is retrograde flow of contrast from the right atrium into the IVC suggestive of right heart dysfunction. There is enlargement of the right heart chambers with RV/LV ratio of 1.5 consistent with right heart straining. Mild atherosclerotic calcification of the aortic arch. No aneurysmal dilatation or dissection. The origins of the great vessels of the aortic arch appear patent. There is dilatation of the pulmonary arteries suggestive of pulmonary hypertension. Moderate-sized bilateral pulmonary artery emboli involving the lobar pulmonary artery in the lingula and right middle lobe as well as additional segmental emboli bilaterally. Mediastinum/Nodes: No hilar or mediastinal adenopathy. The esophagus and the thyroid gland are grossly unremarkable. No mediastinal fluid collection. Lungs/Pleura:  No focal consolidation, pleural effusion, or pneumothorax. The central airways are patent. Upper Abdomen: No acute abnormality. Musculoskeletal: Mild degenerative changes. No acute osseous pathology. Review of the MIP images confirms the above findings. IMPRESSION: 1. Positive for acute  PE with CT evidence of right heart strain (RV/LV Ratio = 1.5) consistent with at least submassive (intermediate risk) PE. The presence of right heart strain has been associated with an increased risk of morbidity and mortality. Please refer to the "Code PE Focused" order set in EPIC. 2. Aortic Atherosclerosis (ICD10-I70.0). Electronically Signed   By: Elgie Collard M.D.   On: 12/31/2021 00:13   DG Chest Port 1 View  Result Date: 12/30/2021 CLINICAL DATA:  Shortness of breath EXAM: PORTABLE CHEST 1 VIEW COMPARISON:  12/31/2019 FINDINGS: Cardiomegaly with mild central congestion. No acute airspace disease, pleural effusion or pneumothorax. Central pulmonary vessels appear slightly enlarged. IMPRESSION: 1. Cardiomegaly with mild central congestion 2. Prominent central pulmonary vessels, question due to pulmonary arterial hypertension. Electronically Signed   By: Jasmine Pang M.D.   On: 12/30/2021 19:09   XR Knee 1-2 Views Left  Result Date: 12/23/2021 Radiographs of the left knee show stable alignment total knee arthroplasty hardware.  No sign of loosening.    The results of significant diagnostics from this hospitalization (including imaging, microbiology, ancillary and laboratory) are listed below for reference.     Microbiology: Recent Results (from the past 240 hour(s))  Resp Panel by RT-PCR (Flu A&B, Covid) Anterior Nasal Swab     Status: None   Collection Time: 12/30/21 11:12 PM   Specimen: Anterior Nasal Swab  Result Value Ref Range Status   SARS Coronavirus 2 by RT PCR NEGATIVE NEGATIVE Final    Comment: (NOTE) SARS-CoV-2 target nucleic acids are NOT DETECTED.  The SARS-CoV-2 RNA is generally detectable in upper respiratory specimens during the acute phase of infection. The lowest concentration of SARS-CoV-2 viral copies this assay can detect is 138 copies/mL. A negative result does not preclude SARS-Cov-2 infection and should not be used as the sole basis for treatment or other  patient management decisions. A negative result may occur with  improper specimen collection/handling, submission of specimen other than nasopharyngeal swab, presence of viral mutation(s) within the areas targeted by this assay, and inadequate number of viral copies(<138 copies/mL). A negative result must be combined with clinical observations, patient history, and epidemiological information. The expected result is Negative.  Fact Sheet for Patients:  BloggerCourse.com  Fact Sheet for Healthcare Providers:  SeriousBroker.it  This test is no t yet approved or cleared by the [place] FDA and  has been authorized for detection and/or diagnosis of SARS-CoV-2 by FDA under an Emergency Use Authorization (EUA). This EUA will remain  in effect (meaning this test can be used) for the duration of the COVID-19 declaration under Section 564(b)(1) of the Act, 21 U.S.C.section 360bbb-3(b)(1), unless the authorization is terminated  or revoked sooner.       Influenza A by PCR NEGATIVE NEGATIVE Final   Influenza B by PCR NEGATIVE NEGATIVE Final    Comment: (NOTE) The Xpert Xpress SARS-CoV-2/FLU/RSV plus assay is intended as an aid in the diagnosis of influenza from Nasopharyngeal swab specimens and should not be used as a sole basis for treatment. Nasal washings and aspirates are unacceptable for Xpert Xpress SARS-CoV-2/FLU/RSV testing.  Fact Sheet for Patients: BloggerCourse.com  Fact Sheet for Healthcare Providers: SeriousBroker.it  This test is not yet approved or cleared by the [place] FDA and has been  authorized for detection and/or diagnosis of SARS-CoV-2 by FDA under an Emergency Use Authorization (EUA). This EUA will remain in effect (meaning this test can be used) for the duration of the COVID-19 declaration under Section 564(b)(1) of the Act, 21 U.S.C. section  360bbb-3(b)(1), unless the authorization is terminated or revoked.  Performed at [facility], [address] [place], [place] [address]      Labs: BNP (last 3 results) Recent Labs    12/30/21 1947  BNP 146.2*   Basic Metabolic Panel: Recent Labs  Lab 12/30/21 1847 01/01/22 0506 01/02/22 0132 01/03/22 0029 01/04/22 0413  NA 139 141 137 138 135  K 4.0 4.4 4.0 4.4 4.1  CL 110 112* 112* 111 110  CO2 19* 19*  GLUCOSE 121* 90 96 98 101*  BUN CREATININE 0.87 0.96 0.90 1.06* 0.96  CALCIUM 9.4 8.8* 8.1* 8.8* 8.5*  MG  --  2.1 1.8 2.0 2.0   Liver Function Tests: Recent Labs  Lab 12/30/21 1847  AST 21  ALT 10  ALKPHOS 67  BILITOT 0.7  PROT 7.5  ALBUMIN 3.7   No results for input(s): "LIPASE", "AMYLASE" in the last 168 hours. No results for input(s): "AMMONIA" in the last 168 hours. CBC: Recent Labs  Lab 12/31/21 0838 01/01/22 0506 01/02/22 0132 01/03/22 0029 01/04/22 0413  WBC 6.4 6.2 8.5 7.7 8.1  HGB 10.5* 10.0* 9.6* 9.3* 9.1*  HCT 34.1* 32.5* 30.2* 29.3* 29.3*  MCV 85.5 84.4 83.4 83.5 83.5  PLT 253 246 207 184 182   Cardiac Enzymes: No results for input(s): "CKTOTAL", "CKMB", "CKMBINDEX", "TROPONINI" in the last 168 hours. BNP: Invalid input(s): "POCBNP" CBG: No results for input(s): "GLUCAP" in the last 168 hours. D-Dimer No results for input(s): "DDIMER" in the last 72 hours. Hgb A1c No results for input(s): "HGBA1C" in the last 72 hours. Lipid Profile No results for input(s): "CHOL", "HDL", "LDLCALC", "TRIG", "CHOLHDL", "LDLDIRECT" in the last 72 hours. Thyroid function studies No results for input(s): "TSH", "T4TOTAL", "T3FREE", "THYROIDAB" in the last 72 hours.  Invalid input(s): "FREET3" Anemia work up No results for input(s): "VITAMINB12", "FOLATE", "FERRITIN", "TIBC", "IRON", "RETICCTPCT" in the last 72 hours. Urinalysis    Component Value Date/Time   COLORURINE YELLOW (A) 11/02/2015 1335    APPEARANCEUR HAZY (A) 11/02/2015 1335   LABSPEC 1.018 11/02/2015 1335   PHURINE 5.0 11/02/2015 1335   GLUCOSEU NEGATIVE 11/02/2015 1335   HGBUR NEGATIVE 11/02/2015 1335   BILIRUBINUR NEGATIVE 11/02/2015 1335   KETONESUR NEGATIVE 11/02/2015 1335   PROTEINUR NEGATIVE 11/02/2015 1335   NITRITE NEGATIVE 11/02/2015 1335   LEUKOCYTESUR 1+ (A) 11/02/2015 1335   Sepsis Labs Recent Labs  Lab 01/01/22 0506 01/02/22 0132 01/03/22 0029 01/04/22 0413  WBC 6.2 8.5 7.7 8.1   Microbiology Recent Results (from the past 240 hour(s))  Resp Panel by RT-PCR (Flu A&B, Covid) Anterior Nasal Swab     Status: None   Collection Time: 12/30/21 11:12 PM   Specimen: Anterior Nasal Swab  Result Value Ref Range Status   SARS Coronavirus 2 by RT PCR NEGATIVE NEGATIVE Final    Comment: (NOTE) SARS-CoV-2 target nucleic acids are NOT DETECTED.  The SARS-CoV-2 RNA is generally detectable in upper respiratory specimens during the acute phase of infection. The lowest concentration of SARS-CoV-2 viral copies this assay can detect is 138 copies/mL. A negative result does not preclude SARS-Cov-2 infection and should not be used as the sole basis for treatment or  other patient management decisions. A negative result may occur with  improper specimen collection/handling, submission of specimen other than nasopharyngeal swab, presence of viral mutation(s) within the areas targeted by this assay, and inadequate number of viral copies(<138 copies/mL). A negative result must be combined with clinical observations, patient history, and epidemiological information. The expected result is Negative.  Fact Sheet for Patients:  BloggerCourse.com  Fact Sheet for Healthcare Providers:  SeriousBroker.it  This test is no t yet approved or cleared by the [place] FDA and  has been authorized for detection and/or diagnosis of SARS-CoV-2 by FDA under an Emergency Use  Authorization (EUA). This EUA will remain  in effect (meaning this test can be used) for the duration of the COVID-19 declaration under Section 564(b)(1) of the Act, 21 U.S.C.section 360bbb-3(b)(1), unless the authorization is terminated  or revoked sooner.       Influenza A by PCR NEGATIVE NEGATIVE Final   Influenza B by PCR NEGATIVE NEGATIVE Final    Comment: (NOTE) The Xpert Xpress SARS-CoV-2/FLU/RSV plus assay is intended as an aid in the diagnosis of influenza from Nasopharyngeal swab specimens and should not be used as a sole basis for treatment. Nasal washings and aspirates are unacceptable for Xpert Xpress SARS-CoV-2/FLU/RSV testing.  Fact Sheet for Patients: BloggerCourse.com  Fact Sheet for Healthcare Providers: SeriousBroker.it  This test is not yet approved or cleared by the [place] FDA and has been authorized for detection and/or diagnosis of SARS-CoV-2 by FDA under an Emergency Use Authorization (EUA). This EUA will remain in effect (meaning this test can be used) for the duration of the COVID-19 declaration under Section 564(b)(1) of the Act, 21 U.S.C. section 360bbb-3(b)(1), unless the authorization is terminated or revoked.  Performed at [facility], [address] [place], [place] [address]      Time coordinating discharge:  I have spent 35 minutes face to face with the patient and on the ward discussing the patients care, assessment, plan and disposition with other care givers. >50% of the time was devoted counseling the patient about the risks and benefits of treatment/Discharge disposition and coordinating care.   SIGNED:   Dimple Nanas, MD  [facility] 01/04/2022, 12:18 PM   If 7PM-7AM, please contact night-coverage

## 2022-01-04 NOTE — Progress Notes (Signed)
ANTICOAGULATION CONSULT NOTE  Pharmacy Consult for Heparin infusion Indication: acute submassive PE  Patient Measurements: Height: 5\' 6"  (167.6 cm) Weight: 90.7 kg (199 lb 15.3 oz) IBW/kg (Calculated) : 59.3 Heparin Dosing Weight: 79.1 kg   Labs: Recent Labs    01/01/22 2320 01/01/22 2320 01/02/22 0132 01/02/22 1223 01/02/22 1758 01/03/22 0029 01/04/22 0413  HGB  --    < > 9.6*  --   --  9.3* 9.1*  HCT  --   --  30.2*  --   --  29.3* 29.3*  PLT  --   --  207  --   --  184 182  APTT 150*  --  174* 93*  --   --   --   HEPARINUNFRC 1.04*  --   --  0.68 0.68 0.59 0.46  CREATININE  --   --  0.90  --   --  1.06* 0.96   < > = values in this interval not displayed.     Estimated Creatinine Clearance: 62.8 mL/min (by C-G formula based on SCr of 0.96 mg/dL).  Medical History: Past Medical History:  Diagnosis Date   Arthritis    DVT (deep venous thrombosis) (HCC) 2013   Dyspnea    due to knee pain   Family history of adverse reaction to anesthesia    Brother and sister hard to wake up.   GERD (gastroesophageal reflux disease)    History of blood transfusion    History of hypertension    Hypertension    Peripheral vascular disease (HCC)    DVT   Pre-diabetes    Pulmonary embolism [facility]) 2013   Assessment: Leah Roberts is a 71 year old female admitted with PE. PMH significant for history of DVT (2/2 16 hr flight in 2014, on eliquis), arthritis, GERD, HTN, PVD, pre-diabetes. Patient on Eliquis 5 mg PO BID with last dose taken in ED lobby 8/30 @ ~2300. 8/30 CTA chest positve for acute PE with CT evidence of right heart strain (RV/LV Ratio = 1.5) consistent with at least submassive (intermediate risk) PE. 8/31 venous 9/31 negative for DVT in either lower extremity. Pharmacy has been consulted to initiate and manage heparin infusion.  Baseline Labs: aPTT 32, HL >1.10, PT 18.2, INR 1.5, Hgb 11.2, Hct 35.7, Plt 275  Goal of Therapy:  Heparin level 0.3-0.7 units/ml aPTT 66 -  102  seconds Monitor platelets by anticoagulation protocol: Yes  Monitoring  Date Time HL/aPTT Rate/Comment  8/31 0838 >1.10/58 SUBtherapeutic 8/31 1700 N/A / 84 Therapeutic x 1 8/31     2309         71              Therapeutic X 2  9/01     0506    0.23, 30          SUBtherapeutic 9/01     2320     150                Elevated after restart, lab error ? 9/02     0132     174                Elevated (repeat previous level) -RN reports pt oozing blood at puncture site.  Vascular advised to hold heparin drip for additional hour (2 hrs)so will restart around 0530.  9/02 1223 0.68/93 9/02     1758    0.68 9/02     0029    0.59, therapeutic  X 3 9/04     0413    0.46, therapeutic X 4     Plan:  9/4:  HL @ 0413 = 0.46, therapeutic X 4 Will continue pt on current rate and recheck HL on 9/5 with AM labs.  CBC daily  Thank you for allowing pharmacy to be a part of this patient's care.  Navayah Sok D 01/04/2022 5:45 AM

## 2022-01-05 ENCOUNTER — Encounter: Payer: Self-pay | Admitting: Vascular Surgery

## 2022-01-05 ENCOUNTER — Other Ambulatory Visit: Payer: Self-pay

## 2022-01-05 ENCOUNTER — Telehealth: Payer: Self-pay | Admitting: Orthopedic Surgery

## 2022-01-05 NOTE — Telephone Encounter (Signed)
Tiffany with [facility] called and was wondering if she could get VO to continue home health? Apparently when she was discharged they didn't say anything about home health.   CB [phone]

## 2022-01-05 NOTE — Telephone Encounter (Signed)
Dr. Lajoyce Corners aware pt was admitted to the hospital with Bilat PE she was s/p a toal knee on 12/09/21 on eliquis. Stable and d/c yesterday s/p procedure with vascular  with vascular . Ok for PT to resume eval and treat as able. And will call with questions.

## 2022-01-06 ENCOUNTER — Telehealth: Payer: Self-pay

## 2022-01-06 NOTE — Telephone Encounter (Signed)
Transition Care Management Unsuccessful Follow-up Telephone Call  Date of discharge and from where:  01/04/22 [facility]  Attempts:  1st Attempt  Reason for unsuccessful TCM follow-up call:  Unable to reach patient. Will follow.

## 2022-01-07 NOTE — Telephone Encounter (Signed)
Transition Care Management Follow-up Telephone Call Date of discharge and from where: 01/04/22 [facility] How have you been since you were released from the hospital? Patient states," Wound is getting better. Dry and clean, slight bruising. Dressing changed one daily." Denies n/v/d. Bowels now moving appropriately, little to no constipation. Pain is managed with medication. Denies falls, dizziness, headache, fever and all other harmful symptoms. Any questions or concerns? No  Items Reviewed: Did the pt receive and understand the discharge instructions provided? Yes  Medications obtained and verified? Yes  Any new allergies since your discharge? No  Dietary orders reviewed? Yes Do you have support at home? Currently staying with sister.  Home Care and Equipment/Supplies: Were home health services ordered? No  Functional Questionnaire: (I = Independent and D = Dependent) ADLs: I  Bathing/Dressing- I  Meal Prep- I  Eating- I  Maintaining continence- I  Transferring/Ambulation- Walker in use  Managing Meds- I  Follow up appointments reviewed:  PCP Hospital f/u appt confirmed? No. Patient declined scheduling today and notes she will call the office back next week to schedule after checking her PT calendar.  Specialist Hospital f/u appt confirmed? Number provided to Oncology for scheduling. [phone]. Are transportation arrangements needed? No, nephew to bring. If their condition worsens, is the pt aware to call PCP or go to the Emergency Dept.? Yes Was the patient provided with contact information for the PCP's office or ED? Yes Was to pt encouraged to call back with questions or concerns? Yes

## 2022-01-08 ENCOUNTER — Other Ambulatory Visit: Payer: Self-pay | Admitting: Orthopedic Surgery

## 2022-01-08 ENCOUNTER — Telehealth: Payer: Self-pay | Admitting: Orthopedic Surgery

## 2022-01-08 ENCOUNTER — Other Ambulatory Visit: Payer: Self-pay

## 2022-01-08 MED ORDER — OXYCODONE-ACETAMINOPHEN 5-325 MG PO TABS
1.0000 | ORAL_TABLET | Freq: Four times a day (QID) | ORAL | 0 refills | Status: DC | PRN
  Filled 2022-01-08: qty 15, 4d supply, fill #0

## 2022-01-08 NOTE — Telephone Encounter (Signed)
S/p left TKA 12/09/21 Oxycodone last filled 01/04/22 #15

## 2022-01-08 NOTE — Telephone Encounter (Signed)
Patient called. She will run out of oxycodone this weekend. Would like a refill on it called in. Her call back number is [phone]

## 2022-01-12 ENCOUNTER — Other Ambulatory Visit: Payer: Self-pay

## 2022-01-12 ENCOUNTER — Other Ambulatory Visit: Payer: Self-pay | Admitting: Orthopedic Surgery

## 2022-01-12 ENCOUNTER — Telehealth: Payer: Self-pay | Admitting: Orthopedic Surgery

## 2022-01-12 MED ORDER — OXYCODONE-ACETAMINOPHEN 5-325 MG PO TABS
1.0000 | ORAL_TABLET | Freq: Four times a day (QID) | ORAL | 0 refills | Status: DC | PRN
  Filled 2022-01-12: qty 30, 8d supply, fill #0

## 2022-01-12 NOTE — Telephone Encounter (Signed)
LM secure VM of VO approval.

## 2022-01-12 NOTE — Telephone Encounter (Signed)
Pt called requesting a refill of pain medication. Pt asking for a call back to up her quantity in medication. Please call pt about this matter at [phone].

## 2022-01-12 NOTE — Telephone Encounter (Signed)
Gerri Spore (PT) from [facility] called requesting pt home health. Requesting extended pt for 2 wk 5. Please call Gerri Spore back at [phone].

## 2022-01-12 NOTE — Telephone Encounter (Signed)
S/p left TKA 12/09/21 Oxycodone last filled 01/08/22 #15, she wants to know if she can get more than 15 at a time, please advise

## 2022-01-13 ENCOUNTER — Telehealth: Payer: Self-pay | Admitting: Family

## 2022-01-13 ENCOUNTER — Ambulatory Visit (INDEPENDENT_AMBULATORY_CARE_PROVIDER_SITE_OTHER): Payer: No Typology Code available for payment source | Admitting: Family

## 2022-01-13 ENCOUNTER — Ambulatory Visit
Admission: RE | Admit: 2022-01-13 | Discharge: 2022-01-13 | Disposition: A | Discharge status: Home / Self Care | Payer: No Typology Code available for payment source | Source: Ambulatory Visit | Attending: Family | Admitting: Family

## 2022-01-13 ENCOUNTER — Encounter: Payer: Self-pay | Admitting: Family

## 2022-01-13 VITALS — BP 120/78 | HR 76 | Temp 97.9°F | Resp 14 | Ht 66.0 in | Wt 224.0 lb

## 2022-01-13 DIAGNOSIS — I2699 Other pulmonary embolism without acute cor pulmonale: Secondary | ICD-10-CM

## 2022-01-13 DIAGNOSIS — I7090 Unspecified atherosclerosis: Secondary | ICD-10-CM

## 2022-01-13 DIAGNOSIS — R1031 Right lower quadrant pain: Secondary | ICD-10-CM | POA: Insufficient documentation

## 2022-01-13 LAB — COMPREHENSIVE METABOLIC PANEL
ALT: 7 U/L (ref 0–35)
AST: 14 U/L (ref 0–37)
Albumin: 3.7 g/dL (ref 3.5–5.2)
Alkaline Phosphatase: 64 U/L (ref 39–117)
BUN: 13 mg/dL (ref 6–23)
CO2: 23 mEq/L (ref 19–32)
Calcium: 9.7 mg/dL (ref 8.4–10.5)
Chloride: 107 mEq/L (ref 96–112)
Creatinine, Ser: 0.95 mg/dL (ref 0.40–1.20)
GFR: 61.28 mL/min (ref >60.00)
Glucose, Bld: 84 mg/dL (ref 70–99)
Potassium: 4.2 mEq/L (ref 3.5–5.1)
Sodium: 138 mEq/L (ref 135–145)
Total Bilirubin: 0.5 mg/dL (ref 0.2–1.2)
Total Protein: 7.4 g/dL (ref 6.0–8.3)

## 2022-01-13 LAB — CBC WITH DIFFERENTIAL/PLATELET
Basophils Absolute: 0 10*3/uL (ref 0.0–0.1)
Basophils Relative: 1.1 % (ref 0.0–3.0)
Eosinophils Absolute: 0.1 10*3/uL (ref 0.0–0.7)
Eosinophils Relative: 2 % (ref 0.0–5.0)
HCT: 32.2 % — ABNORMAL LOW (ref 36.0–46.0)
Hemoglobin: 10.2 g/dL — ABNORMAL LOW (ref 12.0–15.0)
Lymphocytes Relative: 41.3 % (ref 12.0–46.0)
Lymphs Abs: 1.8 10*3/uL (ref 0.7–4.0)
MCHC: 31.5 g/dL (ref 30.0–36.0)
MCV: 84.3 fl (ref 78.0–100.0)
Monocytes Absolute: 0.4 10*3/uL (ref 0.1–1.0)
Monocytes Relative: 8.2 % (ref 3.0–12.0)
Neutro Abs: 2.1 10*3/uL (ref 1.4–7.7)
Neutrophils Relative %: 47.4 % (ref 43.0–77.0)
Platelets: 274 10*3/uL (ref 150.0–400.0)
RBC: 3.82 Mil/uL — ABNORMAL LOW (ref 3.87–5.11)
RDW: 16.1 % — ABNORMAL HIGH (ref 11.5–15.5)
WBC: 4.3 10*3/uL (ref 4.0–10.5)

## 2022-01-13 LAB — IBC + FERRITIN
Ferritin: 53.1 ng/mL (ref 10.0–291.0)
Iron: 59 ug/dL (ref 42–145)
Saturation Ratios: 14.9 % — ABNORMAL LOW (ref 20.0–50.0)
TIBC: 394.8 ug/dL (ref 250.0–450.0)
Transferrin: 282 mg/dL (ref 212.0–360.0)

## 2022-01-13 LAB — LIPID PANEL
Cholesterol: 181 mg/dL (ref 0–200)
HDL: 44.6 mg/dL (ref >39.00)
LDL Cholesterol: 110 mg/dL — ABNORMAL HIGH (ref 0–99)
NonHDL: 136.04
Total CHOL/HDL Ratio: 4
Triglycerides: 130 mg/dL (ref 0.0–149.0)
VLDL: 26 mg/dL (ref 0.0–40.0)

## 2022-01-13 NOTE — Assessment & Plan Note (Signed)
Atherosclerosis seen on CTA.  Discussed this finding with patient and advised further risk stratification with cardiology.

## 2022-01-13 NOTE — Assessment & Plan Note (Addendum)
Hospital course reviewed with patient.  Medications reconciled.  She remain compliant with Eliquis 5 mg twice daily.  Per Dr Hessie Dibble, she does not require any further follow-up with him.Pending cbc at this time.

## 2022-01-13 NOTE — Progress Notes (Signed)
Subjective:    Patient ID: Leah Roberts, female    DOB: 1950-06-29, 71 y.o.   MRN: [id number]  CC: Leah Roberts is a 71 y.o. female who presents today for follow up.   HPI: She feels well today She is overall pleased with left knee surgery. She is walking more and doing PT.    SOB has resolved. Compliant with eliquis 5mg  bid.    Continues to pain in right groin. She will see BRB drop 'little bit'. She notices it with exercises during PT.            She presents emergency room 12/30/2021 due to left-sided chest pain shortness of breath.  She had missed 2 doses of Eliquis prior otherwise have been compliant.   D dimer 10  CTA positive for acute PE, atherosclerosis.  she was started on heparin drip Per hospital discharge notes, pulm embolism was not considered a failure of Eliquis. Case discussed with Dr. 01/01/2022.  Postoperatively complication right groin bleeding.    Transition to Eliquis with loading dose 10 mg twice daily for 7 days followed by 5 mg twice daily.   Status post pulmonary thrombectomy Dr. Orlie Dakin 01/01/2022 01/03/22  ultrasound right groin without evidence of pseudo aneurysm.  Hematoma present.  01/01/22 echocardiogram showed left ventricular EF 55 to 60%.  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   GERD (gastroesophageal reflux disease)    History of blood transfusion    History of hypertension    Hypertension    Peripheral vascular disease (HCC)    DVT   Pre-diabetes    Pulmonary embolism (HCC) 2013   Past Surgical History:  Procedure Laterality Date   APPENDECTOMY     PULMONARY THROMBECTOMY Bilateral 01/01/2022   Procedure: PULMONARY THROMBECTOMY;  Surgeon:  03/03/2022, MD;  Location: [facility] INVASIVE CV LAB;  Service: Cardiovascular;  Laterality: Bilateral;   TONSILLECTOMY AND ADENOIDECTOMY     TOTAL KNEE ARTHROPLASTY Left 12/09/2021   Procedure: LEFT TOTAL KNEE ARTHROPLASTY;  Surgeon: 02/08/2022, MD;  Location: [facility];  Service: Orthopedics;  Laterality: Left;   Family History  Problem Relation Age of Onset   Hypertension Father    Heart disease Father 40   Thyroid disease Sister    Allergies Sister    CVA Neg Hx    Heart attack Neg Hx    High Cholesterol Neg Hx    Colon cancer Neg Hx     Allergies: Sulfa antibiotics Current Outpatient Medications on File Prior to Visit  Medication Sig Dispense Refill   apixaban (ELIQUIS) 5 MG TABS tablet Take 2 tablets (10 mg total) by mouth 2 (two) times daily for 7 days, THEN 1 tablet (5 mg total) 2 (two) times daily for 23 days. 74 tablet 0   calcium carbonate (TUMS - DOSED IN MG ELEMENTAL CALCIUM) 500 MG chewable tablet Chew 1 tablet by mouth daily as needed for indigestion or heartburn.     lidocaine (LIDODERM) 5 % Place 1 patch onto the skin every 12 (twelve) hours. Remove & Discard patch within 12 hours or as directed by MD 10 patch 0   loratadine (CLARITIN)  10 MG tablet Take 10 mg by mouth daily.     Menthol, Topical Analgesic, (BIOFREEZE EX) Apply 1 Application topically daily as needed (leg pain).     methocarbamol (ROBAXIN) 500 MG tablet Take 1 tablet (500 mg total) by mouth every 8 (eight) hours as needed for muscle spasms. 20 tablet 0   Multiple Vitamin (MULTIVITAMIN WITH MINERALS) TABS tablet Take 1 tablet by mouth 2 (two) times a week.     oxyCODONE-acetaminophen (PERCOCET/ROXICET) 5-325 MG tablet Take 1 tablet by mouth every 6 (six) hours as needed for severe pain. 30 tablet 0   polyethylene glycol (MIRALAX / GLYCOLAX) 17 g packet Take 17 g by mouth daily. 14 each 0   senna-docusate (SENOKOT-S) 8.6-50 MG tablet Take 1 tablet by mouth at bedtime as needed for moderate constipation. 30  tablet 0   No current facility-administered medications on file prior to visit.    Social History   Tobacco Use   Smoking status: Never   Smokeless tobacco: Never  Substance Use Topics   Alcohol use: Yes    Comment: Ocassional glass of wine   Drug use: No    Review of Systems  Constitutional:  Negative for chills and fever.  Respiratory:  Negative for cough and shortness of breath (resolved).   Cardiovascular:  Negative for chest pain and palpitations.  Gastrointestinal:  Negative for nausea and vomiting.  Skin:  Positive for wound.      Objective:    BP 120/78 (BP Location: Left Arm, Patient Position: Sitting, Cuff Size: Normal)   Pulse 76   Temp 97.9 F (36.6 C) (Oral)   Resp 14   Ht 5\' 6"  (1.676 m)   Wt 224 lb (101.6 kg)   SpO2 95%   BMI 36.15 kg/m  BP Readings from Last 3 Encounters:  01/13/22 120/78  01/04/22 107/73  12/11/21 116/71   Wt Readings from Last 3 Encounters:  01/13/22 224 lb (101.6 kg)  12/30/21 199 lb 15.3 oz (90.7 kg)  12/09/21 199 lb 15.3 oz (90.7 kg)    Physical Exam Vitals reviewed.  Constitutional:      Appearance: She is well-developed.  Eyes:     Conjunctiva/sclera: Conjunctivae normal.  Cardiovascular:     Rate and Rhythm: Normal rate and regular rhythm.     Pulses: Normal pulses.     Heart sounds: Normal heart sounds.  Pulmonary:     Effort: Pulmonary effort is normal.     Breath sounds: Normal breath sounds. No wheezing, rhonchi or rales.  Musculoskeletal:       Legs:     Comments: Healed scar left knee. 2 to 3 cm incision, well approximated.  Scant bright red blood seen.  No purulent discharge, erythema or red streaks.  Mild tenderness with palpation around the area.  Skin:    General: Skin is warm and dry.  Neurological:     Mental Status: She is alert.  Psychiatric:        Speech: Speech normal.        Behavior: Behavior normal.        Thought Content: Thought content normal.        Assessment & Plan:    Problem List Items Addressed This Visit       Cardiovascular and Mediastinum   Atherosclerosis    Atherosclerosis seen on CTA.  Discussed this finding with patient and advised further risk stratification with cardiology.       Relevant Orders   Lipid panel (Completed)  Ambulatory referral to Cardiology   Bilateral pulmonary embolism with right heart strain RV/LV ratio 1:5 [facility])    Hospital course reviewed with patient.  Medications reconciled.  She remain compliant with Eliquis 5 mg twice daily.  Per Dr Hessie Dibble, she does not require any further follow-up with him.Pending cbc at this time.         Other   Right groin pain - Primary    No evidence of infection on exam today.  Right groin ultrasound revealed small hematoma, no pseudoaneurysm identified.  I have shared this with Dr. Gilda Crease as anticipate this is somewhat expected finding with tenderness and occasional scant blood.  Advised patient to keep covered with nonadherent bandage and change every other day.  Advised her to stay vigilant as a relates to symptoms of infection or increased pain.  Advised her to use cool compress.  She will let me know how she is doing      Relevant Orders   CBC with Differential/Platelet (Completed)   Comprehensive metabolic panel (Completed)   IBC + Ferritin (Completed)   [place] [employer] (Completed)     I am having Leah Roberts maintain her loratadine, multivitamin with minerals, (Menthol, Topical Analgesic, (BIOFREEZE EX)), calcium carbonate, lidocaine, methocarbamol, polyethylene glycol, senna-docusate, apixaban, and oxyCODONE-acetaminophen.   No orders of the defined types were placed in this encounter.   Return precautions given.   Risks, benefits, and alternatives of the medications and treatment plan prescribed today were discussed, and patient expressed understanding.   Education regarding symptom management and diagnosis given to patient on AVS.  Continue to  follow with Allegra Grana, FNP for routine health maintenance.   Collin Oralia Rud and I agreed with plan.   Rennie Plowman, FNP

## 2022-01-13 NOTE — Telephone Encounter (Signed)
Dr Orlie Dakin,   [facility] you are well-appearing. Im seeing our mutual patient today for hospital follow-up after PE.     I do not believe she is got a follow-up scheduled with you.  Would you mind having your team reach out to her to schedule?

## 2022-01-13 NOTE — Assessment & Plan Note (Addendum)
No evidence of infection on exam today.  Right groin ultrasound revealed small hematoma, no pseudoaneurysm identified.  I have shared this with Dr. Gilda Crease as anticipate this is somewhat expected finding with tenderness and occasional scant blood.  Advised patient to keep covered with nonadherent bandage and change every other day.  Advised her to stay vigilant as a relates to symptoms of infection or increased pain.  Advised her to use cool compress.  She will let me know how she is doing

## 2022-01-19 ENCOUNTER — Other Ambulatory Visit: Payer: Self-pay

## 2022-01-19 ENCOUNTER — Other Ambulatory Visit: Payer: Self-pay | Admitting: Family

## 2022-01-19 DIAGNOSIS — D649 Anemia, unspecified: Secondary | ICD-10-CM

## 2022-01-19 DIAGNOSIS — I7090 Unspecified atherosclerosis: Secondary | ICD-10-CM

## 2022-01-19 MED ORDER — FERROUS SULFATE 325 (65 FE) MG PO TABS
325.0000 mg | ORAL_TABLET | Freq: Two times a day (BID) | ORAL | 1 refills | Status: DC
  Filled 2022-01-19: qty 60, 30d supply, fill #0
  Filled 2022-02-18: qty 60, 30d supply, fill #1

## 2022-01-19 MED ORDER — ROSUVASTATIN CALCIUM 5 MG PO TABS
5.0000 mg | ORAL_TABLET | Freq: Every day | ORAL | 3 refills | Status: DC
  Filled 2022-01-19: qty 90, 90d supply, fill #0

## 2022-01-19 NOTE — Telephone Encounter (Signed)
Noted  

## 2022-01-20 ENCOUNTER — Encounter: Payer: Self-pay | Admitting: Family

## 2022-01-20 ENCOUNTER — Ambulatory Visit (INDEPENDENT_AMBULATORY_CARE_PROVIDER_SITE_OTHER): Payer: Self-pay | Admitting: Family

## 2022-01-20 ENCOUNTER — Other Ambulatory Visit: Payer: Self-pay

## 2022-01-20 DIAGNOSIS — Z96652 Presence of left artificial knee joint: Secondary | ICD-10-CM

## 2022-01-20 MED ORDER — OXYCODONE-ACETAMINOPHEN 5-325 MG PO TABS
1.0000 | ORAL_TABLET | Freq: Three times a day (TID) | ORAL | 0 refills | Status: DC | PRN
  Filled 2022-01-20: qty 21, 7d supply, fill #0

## 2022-01-20 MED ORDER — BACITRACIN 500 UNIT/GM EX OINT
1.0000 | TOPICAL_OINTMENT | Freq: Two times a day (BID) | CUTANEOUS | 0 refills | Status: DC
  Filled 2022-01-20 – 2022-01-22 (×2): qty 15, 8d supply, fill #0

## 2022-01-20 NOTE — Progress Notes (Signed)
Post-Op Visit Note   Patient: Leah Roberts           Date of Birth: 10/15/1950           MRN: [id number] Visit Date: 01/20/2022 PCP: Burnard Hawthorne, FNP  Chief Complaint:  Chief Complaint  Patient presents with   Left Knee - Routine Post Op    12/09/21 left TKA    HPI:  HPI The patient is a 71 year old woman seen status post left total knee arthroplasty on August 9.  She has been doing outpatient physical therapy. Ortho Exam On examination of the left knee her incision is well-healed there is no erythema edema or warmth.  She has full extension, flexion to 90 degrees. Visit Diagnoses: No diagnosis found.  Plan: We will continue with physical therapy discussed the importance of aggressive work on flexion.  Plan to follow-up once more in 4 weeks  Follow-Up Instructions: No follow-ups on file.   Imaging: No results found.  Orders:  No orders of the defined types were placed in this encounter.  No orders of the defined types were placed in this encounter.    PMFS History: Patient Active Problem List   Diagnosis Date Noted   Right groin pain 01/13/2022   Bilateral pulmonary embolism with right heart strain RV/LV ratio 1:5 ([place]) 12/31/2021   Acute respiratory failure with hypoxia ([place]) 12/31/2021   Total knee replacement status, left 12/09/21 12/09/2021   Unilateral primary osteoarthritis, left knee    Shoulder disorder 09/03/2021   Arthritis of knee 09/03/2021   Lumbar radiculopathy 09/03/2021   Abnormal MRI, knee 09/03/2021   Chronic pain of right knee 09/03/2021   Bronchitis 03/25/2021   Anemia 01/09/2020   HTN (hypertension) 12/31/2019   Chest pain 12/31/2019   DOE (dyspnea on exertion) 12/31/2019   Acute coronary syndrome ([place]) 12/31/2019   Unstable angina ([place]) 12/31/2019   Atherosclerosis 08/23/2018   Screening for tuberculosis 07/12/2018   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   Arthralgia 07/12/2018   Solitary pulmonary nodule 07/12/2018   Neck pain  02/15/2018   Dizziness 02/15/2018   Elevated TSH 02/15/2018   Screening for HIV (human immunodeficiency virus) 10/05/2017   Neuropathy 10/05/2017   Screening for colon cancer 10/05/2017   Hyperlipidemia 08/04/2016   Prediabetes 08/04/2016   Back pain 08/04/2016   Bilateral knee pain 06/18/2016   History of DVT of lower extremity 04/01/2014   History of DVT and PE (deep vein thrombosis) 05/04/2011   Past Medical History:  Diagnosis Date   Arthritis    DVT (deep venous thrombosis) ([place]) 2013   Dyspnea    due to knee pain   Family history of adverse reaction to anesthesia    Brother and sister hard to wake up.   GERD (gastroesophageal reflux disease)    History of blood transfusion    History of hypertension    Hypertension    Peripheral vascular disease (HCC)    DVT   Pre-diabetes    Pulmonary embolism ([place]) 2013    Family History  Problem Relation Age of Onset   Hypertension Father    Heart disease Father 19   Thyroid disease Sister    Allergies Sister    CVA Neg Hx    Heart attack Neg Hx    High Cholesterol Neg Hx    Colon cancer Neg Hx     Past Surgical History:  Procedure Laterality Date   APPENDECTOMY     PULMONARY THROMBECTOMY Bilateral 01/01/2022   Procedure: PULMONARY THROMBECTOMY;  Surgeon: Renford Dills, MD;  Location: [facility] INVASIVE CV LAB;  Service: Cardiovascular;  Laterality: Bilateral;   TONSILLECTOMY AND ADENOIDECTOMY     TOTAL KNEE ARTHROPLASTY Left 12/09/2021   Procedure: LEFT TOTAL KNEE ARTHROPLASTY;  Surgeon: Nadara Mustard, MD;  Location: [facility] OR;  Service: Orthopedics;  Laterality: Left;   Social History   Occupational History   Not on file  Tobacco Use   Smoking status: Never   Smokeless tobacco: Never  Substance and Sexual Activity   Alcohol use: Yes    Comment: Ocassional glass of wine   Drug use: No   Sexual activity: Not on file

## 2022-01-22 ENCOUNTER — Other Ambulatory Visit: Payer: Self-pay

## 2022-01-22 MED FILL — Lidocaine Patch 5%: CUTANEOUS | 5 days supply | Qty: 10 | Fill #0 | Status: CN

## 2022-01-28 ENCOUNTER — Other Ambulatory Visit: Payer: Self-pay | Admitting: Orthopedic Surgery

## 2022-01-28 ENCOUNTER — Telehealth: Payer: Self-pay | Admitting: Family

## 2022-01-28 ENCOUNTER — Other Ambulatory Visit: Payer: Self-pay

## 2022-01-28 MED ORDER — OXYCODONE-ACETAMINOPHEN 5-325 MG PO TABS
1.0000 | ORAL_TABLET | Freq: Three times a day (TID) | ORAL | 0 refills | Status: DC | PRN
  Filled 2022-01-28: qty 21, 7d supply, fill #0

## 2022-01-28 NOTE — Telephone Encounter (Signed)
Pt s/p left TKR 12/09/2021. Requesting refill on Oxycodone 5/325 last fill was 01/20/22 #21, 01/12/22 #30, 01/08/22 #15, 01/05/22 #15

## 2022-01-28 NOTE — Telephone Encounter (Signed)
Pt called requesting a refill of pain medication. Please send to pharmacy on file. Pt phone number is [phone]. 

## 2022-02-03 ENCOUNTER — Ambulatory Visit (INDEPENDENT_AMBULATORY_CARE_PROVIDER_SITE_OTHER): Payer: Self-pay | Admitting: Family

## 2022-02-03 DIAGNOSIS — M1712 Unilateral primary osteoarthritis, left knee: Secondary | ICD-10-CM

## 2022-02-03 DIAGNOSIS — M25561 Pain in right knee: Secondary | ICD-10-CM

## 2022-02-04 ENCOUNTER — Telehealth: Payer: Self-pay | Admitting: Family

## 2022-02-04 ENCOUNTER — Other Ambulatory Visit: Payer: Self-pay | Admitting: Orthopedic Surgery

## 2022-02-04 DIAGNOSIS — Z96652 Presence of left artificial knee joint: Secondary | ICD-10-CM

## 2022-02-04 NOTE — Telephone Encounter (Signed)
Best, Crystal L routed conversation to You 9 minutes ago (1:21 PM)   Best, Crystal L 9 minutes ago (1:21 PM)   CB Pt called requesting her outpatient rehab for physical therapy be sent to [facility]. Please call pt about this matter at [phone].

## 2022-02-04 NOTE — Telephone Encounter (Signed)
Referral placed, pt informed. She stated something about her pain med needing to be called in? She will run out over the weekend.

## 2022-02-04 NOTE — Telephone Encounter (Signed)
Pt called requesting her outpatient rehab for physical therapy be sent to [facility]. Please call pt about this matter at [phone].

## 2022-02-04 NOTE — Telephone Encounter (Signed)
See previous telephone encounter.

## 2022-02-04 NOTE — Telephone Encounter (Signed)
Leah Roberts (PT) from [facility] called stating pt will be discharged this week  and need outpatient rehab script. Leah Roberts phone number is [phone].

## 2022-02-05 ENCOUNTER — Other Ambulatory Visit: Payer: Self-pay

## 2022-02-05 ENCOUNTER — Encounter: Payer: Self-pay | Admitting: Family

## 2022-02-05 DIAGNOSIS — M25561 Pain in right knee: Secondary | ICD-10-CM

## 2022-02-05 MED ORDER — LIDOCAINE HCL 1 % IJ SOLN
5.0000 mL | INTRAMUSCULAR | Status: AC | PRN
  Administered 2022-02-05: 5 mL

## 2022-02-05 MED ORDER — OXYCODONE-ACETAMINOPHEN 5-325 MG PO TABS
1.0000 | ORAL_TABLET | Freq: Three times a day (TID) | ORAL | 0 refills | Status: DC | PRN
  Filled 2022-02-05: qty 21, 7d supply, fill #0

## 2022-02-05 MED ORDER — METHYLPREDNISOLONE ACETATE 40 MG/ML IJ SUSP
40.0000 mg | INTRAMUSCULAR | Status: AC | PRN
  Administered 2022-02-05: 40 mg via INTRA_ARTICULAR

## 2022-02-05 NOTE — Progress Notes (Signed)
Post-Op Visit Note   Patient: Leah Roberts           Date of Birth: [birth year]           MRN: [id number] Visit Date: 02/03/2022 PCP: Allegra Grana, FNP  Chief Complaint:  Chief Complaint  Patient presents with   Left Knee - Routine Post Op    12/09/21 left TKA    HPI:  HPI The patient is a 71 year old woman who presents status post left total knee arthroplasty on August 9.  She has been working hard with physical therapy work on her range of motion especially flexion on the left.  She states she fears that she pushed it too hard in physical therapy she has begun having increasing pain of her right knee this is been gradual there was not an acute injury.  She is been having increased swelling to both knees feels that she can no longer fully extend the right knee  Quite concerned for her loss of range of motion unable to flex the left knee is much as she warms nor extend the right this is made ambulation hard  Wonders if she is making things worse with her physical therapy she is also asking about having a manipulation of the left knee Ortho Exam On examination of the left knee there is moderate edema no effusion incision is well-healed the collaterals are stable.  Extensor mechanism intact.  Full extension flexion to 85. On examination of the right knee again moderate edema there is no obvious effusion.  She is primarily tender over the medial joint line pain with passive extension collaterals and cruciates are stable.  Procedure Note  Patient: Leah Roberts             Date of Birth: Dec 01, 1950           MRN: [id number]             Visit Date: 02/03/2022  Procedures: Visit Diagnoses:  1. Unilateral primary osteoarthritis, left knee   2. Acute pain of right knee     Large Joint Inj: R knee on 02/05/2022 8:47 AM Indications: pain Details: 18 G 1.5 in needle, anteromedial approach Medications: 5 mL lidocaine 1 %; 40 mg methylPREDNISolone acetate 40 MG/ML Consent  was given by the patient.         Visit Diagnoses:  1. Unilateral primary osteoarthritis, left knee   2. Acute pain of right knee     Plan: Depo-Medrol injection right knee concern for meniscal injury.  Discussed continuing with aggressive physical therapy she will work on her flexion do her exercises 5 times each hour while awake follow-up in the office as scheduled  Follow-Up Instructions: No follow-ups on file.   Imaging: No results found.  Orders:  Orders Placed This Encounter  Procedures   Large Joint Inj   No orders of the defined types were placed in this encounter.    PMFS History: Patient Active Problem List   Diagnosis Date Noted   Right groin pain 01/13/2022   Bilateral pulmonary embolism with right heart strain RV/LV ratio 1:5 (HCC) 12/31/2021   Acute respiratory failure with hypoxia (HCC) 12/31/2021   Total knee replacement status, left 12/09/21 12/09/2021   Unilateral primary osteoarthritis, left knee    Shoulder disorder 09/03/2021   Arthritis of knee 09/03/2021   Lumbar radiculopathy 09/03/2021   Abnormal MRI, knee 09/03/2021   Chronic pain of right knee 09/03/2021   Bronchitis 03/25/2021   Anemia 01/09/2020  HTN (hypertension) 12/31/2019   Chest pain 12/31/2019   DOE (dyspnea on exertion) 12/31/2019   Acute coronary syndrome ([place]) 12/31/2019   Unstable angina ([place]) 12/31/2019   Atherosclerosis 08/23/2018   Screening for tuberculosis 07/12/2018   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   Arthralgia 07/12/2018   Solitary pulmonary nodule 07/12/2018   Neck pain 02/15/2018   Dizziness 02/15/2018   Elevated TSH 02/15/2018   Screening for HIV (human immunodeficiency virus) 10/05/2017   Neuropathy 10/05/2017   Screening for colon cancer 10/05/2017   Hyperlipidemia 08/04/2016   Prediabetes 08/04/2016   Back pain 08/04/2016   Bilateral knee pain 06/18/2016   History of DVT of lower extremity 04/01/2014   History of DVT and PE (deep vein thrombosis) 05/04/2011    Past Medical History:  Diagnosis Date   Arthritis    DVT (deep venous thrombosis) ([place]) 2013   Dyspnea    due to knee pain   Family history of adverse reaction to anesthesia    Brother and sister hard to wake up.   GERD (gastroesophageal reflux disease)    History of blood transfusion    History of hypertension    Hypertension    Peripheral vascular disease (HCC)    DVT   Pre-diabetes    Pulmonary embolism ([place]) 2013    Family History  Problem Relation Age of Onset   Hypertension Father    Heart disease Father 33   Thyroid disease Sister    Allergies Sister    CVA Neg Hx    Heart attack Neg Hx    High Cholesterol Neg Hx    Colon cancer Neg Hx     Past Surgical History:  Procedure Laterality Date   APPENDECTOMY     PULMONARY THROMBECTOMY Bilateral 01/01/2022   Procedure: PULMONARY THROMBECTOMY;  Surgeon: Katha Cabal, MD;  Location: [facility];  Service: Cardiovascular;  Laterality: Bilateral;   TONSILLECTOMY AND ADENOIDECTOMY     TOTAL KNEE ARTHROPLASTY Left 12/09/2021   Procedure: LEFT TOTAL KNEE ARTHROPLASTY;  Surgeon: Newt Minion, MD;  Location: [facility];  Service: Orthopedics;  Laterality: Left;   Social History   Occupational History   Not on file  Tobacco Use   Smoking status: Never   Smokeless tobacco: Never  Substance and Sexual Activity   Alcohol use: Yes    Comment: Ocassional glass of wine   Drug use: No   Sexual activity: Not on file

## 2022-02-11 ENCOUNTER — Ambulatory Visit: Payer: No Typology Code available for payment source | Attending: Orthopedic Surgery

## 2022-02-11 DIAGNOSIS — R262 Difficulty in walking, not elsewhere classified: Secondary | ICD-10-CM | POA: Insufficient documentation

## 2022-02-11 DIAGNOSIS — M25562 Pain in left knee: Secondary | ICD-10-CM | POA: Insufficient documentation

## 2022-02-11 DIAGNOSIS — G8929 Other chronic pain: Secondary | ICD-10-CM | POA: Insufficient documentation

## 2022-02-11 DIAGNOSIS — Z96652 Presence of left artificial knee joint: Secondary | ICD-10-CM | POA: Insufficient documentation

## 2022-02-11 DIAGNOSIS — M6281 Muscle weakness (generalized): Secondary | ICD-10-CM | POA: Insufficient documentation

## 2022-02-11 DIAGNOSIS — M25662 Stiffness of left knee, not elsewhere classified: Secondary | ICD-10-CM | POA: Insufficient documentation

## 2022-02-11 NOTE — Therapy (Signed)
[facility] [address], [place], [address] Phone: [phone]   Fax:  [phone]  Physical Therapy Evaluation  Patient Details  Name: Leah Roberts MRN: [id number] Date of Birth: 01/18/1951 Referring Provider (PT): Nadara Mustard, MD   Encounter Date: 02/11/2022   PT End of Session - 02/11/22 1103     Visit Number 1    Number of Visits 17    Date for PT Re-Evaluation 04/08/22    Authorization Type 1    Authorization Time Period of 10 progress report    PT Start Time 1103    PT Stop Time 1148    PT Time Calculation (min) 45 min    Activity Tolerance Patient tolerated treatment well    Behavior During Therapy [facility] for tasks assessed/performed             Past Medical History:  Diagnosis Date   Arthritis    DVT (deep venous thrombosis) (HCC) 2013   Dyspnea    due to knee pain   Family history of adverse reaction to anesthesia    Brother and sister hard to wake up.   GERD (gastroesophageal reflux disease)    History of blood transfusion    History of hypertension    Hypertension    Peripheral vascular disease (HCC)    DVT   Pre-diabetes    Pulmonary embolism (HCC) 2013    Past Surgical History:  Procedure Laterality Date   APPENDECTOMY     PULMONARY THROMBECTOMY Bilateral 01/01/2022   Procedure: PULMONARY THROMBECTOMY;  Surgeon: Renford Dills, MD;  Location: [facility];  Service: Cardiovascular;  Laterality: Bilateral;   TONSILLECTOMY AND ADENOIDECTOMY     TOTAL KNEE ARTHROPLASTY Left 12/09/2021   Procedure: LEFT TOTAL KNEE ARTHROPLASTY;  Surgeon: Nadara Mustard, MD;  Location: [facility];  Service: Orthopedics;  Laterality: Left;    There were no vitals filed for this visit.    Subjective Assessment - 02/11/22 1107     Subjective L knee: 6/10 currently.    Pertinent History S/P L TKE 12/09/2021. The DVT and PE prevented her from participating in much PT. Had home health  PT for 4 weeks. Pt was using SPC R side prior to surgery.    Patient Stated Goals Improve balance, decreased L lateral leg pulling.    Currently in Pain? Yes    Pain Score 6     Pain Location Knee    Pain Orientation Left    Pain Type Chronic pain;Surgical pain    Pain Onset More than a month ago    Pain Frequency Constant                [facility] PT Assessment - 02/11/22 1111       Assessment   Medical Diagnosis Z96.652 (ICD-10-CM) - Status post total left knee replacement    Referring Provider (PT) Nadara Mustard, MD    Onset Date/Surgical Date 12/09/21    Prior Therapy Home health PT      Precautions   Precaution Comments No known precautions      Balance Screen   Has the patient fallen in the past 6 months Yes    How many times? 1   Dr. Lajoyce Corners said knee surgery was ok.   Has the patient had a decrease in activity level because of a fear of falling?  No    Is the patient reluctant to leave their home because of  a fear of falling?  No      Home Environment   Additional Comments Pt lives in a 1 story home with sister. One very small step in the back door, no rails.      Posture/Postural Control   Posture Comments forward flexed, decreased L knee extension      AROM   Right Knee Extension -28    Right Knee Flexion 90    Left Knee Extension -15    Left Knee Flexion 62      Strength   Right Hip Flexion 4-/5    Right Hip Extension 3+/5   seated manually resisted   Right Hip ABduction 4/5   seated manually resisted   Left Hip Flexion 3/5    Left Hip Extension 3+/5   seated manually resisted   Left Hip ABduction 4/5   seated manually resisted   Right Knee Flexion 4/5    Right Knee Extension 4/5    Left Knee Flexion 3+/5    Left Knee Extension 4-/5      Palpation   Palpation comment decreased L knee scar tissue mobility      Ambulation/Gait   Gait Comments Gait: with rw, decreased stance L LE, decreased L knee extension during stance phase, decreased L push off,  forward flexed posture, decreased gait speed.                        Objective measurements completed on examination: See above findings.         Blood pressure is controlled pt.     Surgical incision healed well, very small scab distal third of scar.     Manual therapy Seated STM L scar tissue and L lateral knee to decrease fascial restrictions.  Improved seated L knee flexion AROM to 72 degrees     Therapeutic exercise Seated L knee flexion AAROM with PT 10x4 76 degrees knee flexion AROM sitting      Improved exercise technique, movement at target joints, use of target muscles after mod verbal, visual, tactile cues.   Response to treatment Pt tolerated session well without aggravation of symptoms. Improved L knee flexion AROM after session.     Clinical impression  Pt is a 71 year old female who came to physical therapy S/P L TKA on 12/09/2021. She currently presents with altered gait pattern and posture, decreased L LE strength, limited L knee flexion and extension AROM, L knee swelling, stiffness, decreased scar tissue mobility, and difficulty performing tasks which involves standing and walking as well as stair negotiation. Pt will benefit from skilled physical therapy services to address the aforementioned deficits.                     PT Education - 02/11/22 1316     Education Details ther-ex, POC    Person(s) Educated Patient    Methods Explanation;Demonstration;Tactile cues;Verbal cues    Comprehension Returned demonstration;Verbalized understanding              PT Short Term Goals - 02/11/22 1308       PT SHORT TERM GOAL #1   Title Pt will be independent with her initial HEP to improve L knee AROM, strength, and ability to ambulate with less difficulty.    Time 3    Period Weeks    Status New    Target Date 03/04/22  PT Long Term Goals - 02/11/22 1309       PT LONG TERM GOAL #1   Title  Pt will be able to ambulate at least 500 ft without AD and no LOB to promote mobility.    Baseline Pt currenty ambulating with rw (02/11/2022)    Time 8    Period Weeks    Status New    Target Date 04/08/22      PT LONG TERM GOAL #2   Title Pt will improve her L knee extension AROM to 0 degrees and knee flexion AROM to at least 115 degrees to promote ability to ambulate and negotiate stairs with less difficulty.    Baseline Seated L knee AROM: -15 degrees extension, 62 degrees flexion (02/11/2022)    Time 8    Period Weeks    Status New    Target Date 04/08/22      PT LONG TERM GOAL #3   Title Pt will improve L knee flexion and extension strength by at least 1/2 MMT to promote ability to ambulate with less difficulty.    Baseline knee flexion 3+/5, extension 4-/5 (02/11/2022)    Time 8    Period Weeks    Status New    Target Date 04/08/22      PT LONG TERM GOAL #4   Title Pt will improve her L knee FOTO score by at least 10 points as a demonstration of improved function.    Baseline L knee FOTO 36 (02/11/2022)    Time 8    Period Weeks    Status New    Target Date 04/08/22                    Plan - 02/11/22 1302     Clinical Impression Statement Pt is a 71 year old female who came to physical therapy S/P L TKA on 12/09/2021. She currently presents with altered gait pattern and posture, decreased L LE strength, limited L knee flexion and extension AROM, L knee swelling, stiffness, decreased scar tissue mobility, and difficulty performing tasks which involves standing and walking as well as stair negotiation. Pt will benefit from skilled physical therapy services to address the aforementioned deficits.    Personal Factors and Comorbidities Age;Comorbidity 3+;Fitness;Past/Current Experience;Time since onset of injury/illness/exacerbation    Comorbidities DVT, arthritis, dyspnea, HTN, PE    Examination-Activity Limitations  Bathing;Stairs;Dressing;Stand;Bend;Lift;Transfers;Carry;Squat;Locomotion Level    Stability/Clinical Decision Making Stable/Uncomplicated    Clinical Decision Making Low    Rehab Potential Fair    PT Frequency 2x / week    PT Duration 8 weeks    PT Treatment/Interventions Therapeutic activities;Therapeutic exercise;Functional mobility training;Balance training;Neuromuscular re-education;Patient/family education;Manual techniques;Dry needling;Aquatic Therapy;Electrical Stimulation;Iontophoresis 4mg /ml Dexamethasone;Gait training;Stair training    PT Next Visit Plan Knee A/AROM, hip and knee strengthening, gait training, manual techniques, modalities PRN    Consulted and Agree with Plan of Care Patient             Patient will benefit from skilled therapeutic intervention in order to improve the following deficits and impairments:  Pain, Postural dysfunction, Improper body mechanics, Decreased strength, Difficulty walking, Decreased range of motion, Abnormal gait, Decreased balance  Visit Diagnosis: Chronic pain of left knee - Plan: PT plan of care cert/re-cert  Difficulty in walking, not elsewhere classified - Plan: PT plan of care cert/re-cert  Muscle weakness (generalized) - Plan: PT plan of care cert/re-cert  Stiffness of left knee, not elsewhere classified - Plan: PT plan of care cert/re-cert  Problem List Patient Active Problem List   Diagnosis Date Noted   Right groin pain 01/13/2022   Bilateral pulmonary embolism with right heart strain RV/LV ratio 1:5 (HCC) 12/31/2021   Acute respiratory failure with hypoxia (HCC) 12/31/2021   Total knee replacement status, left 12/09/21 12/09/2021   Unilateral primary osteoarthritis, left knee    Shoulder disorder 09/03/2021   Arthritis of knee 09/03/2021   Lumbar radiculopathy 09/03/2021   Abnormal MRI, knee 09/03/2021   Chronic pain of right knee 09/03/2021   Bronchitis 03/25/2021   Anemia 01/09/2020   HTN (hypertension)  12/31/2019   Chest pain 12/31/2019   DOE (dyspnea on exertion) 12/31/2019   Acute coronary syndrome (HCC) 12/31/2019   Unstable angina (HCC) 12/31/2019   Atherosclerosis 08/23/2018   Screening for tuberculosis 07/12/2018   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   Arthralgia 07/12/2018   Solitary pulmonary nodule 07/12/2018   Neck pain 02/15/2018   Dizziness 02/15/2018   Elevated TSH 02/15/2018   Screening for HIV (human immunodeficiency virus) 10/05/2017   Neuropathy 10/05/2017   Screening for colon cancer 10/05/2017   Hyperlipidemia 08/04/2016   Prediabetes 08/04/2016   Back pain 08/04/2016   Bilateral knee pain 06/18/2016   History of DVT of lower extremity 04/01/2014   History of DVT and PE (deep vein thrombosis) 05/04/2011   Loralyn Freshwater PT, DPT  02/11/2022, 1:24 PM  [facility] [address] [place], [address] Phone: [phone]   Fax:  [phone]  Name: Leah Roberts MRN: [id number] Date of Birth: 14-Mar-1951

## 2022-02-15 ENCOUNTER — Ambulatory Visit: Payer: No Typology Code available for payment source

## 2022-02-15 DIAGNOSIS — R262 Difficulty in walking, not elsewhere classified: Secondary | ICD-10-CM

## 2022-02-15 DIAGNOSIS — M6281 Muscle weakness (generalized): Secondary | ICD-10-CM

## 2022-02-15 DIAGNOSIS — M25662 Stiffness of left knee, not elsewhere classified: Secondary | ICD-10-CM

## 2022-02-15 DIAGNOSIS — G8929 Other chronic pain: Secondary | ICD-10-CM

## 2022-02-15 NOTE — Therapy (Signed)
OUTPATIENT PHYSICAL THERAPY TREATMENT NOTE   Patient Name: Leah Roberts MRN: [id number] DOB:April 25, 1951, 71 y.o., female Today's Date: 02/15/2022  PCP: Allegra Grana, FNP REFERRING PROVIDER: Nadara Mustard, MD   PT End of Session - 02/15/22 1150     Visit Number 2    Number of Visits 17    Date for PT Re-Evaluation 04/08/22    Authorization Type 2    Authorization Time Period of 10 progress report    PT Start Time 1150    PT Stop Time 1232    PT Time Calculation (min) 42 min    Activity Tolerance Patient tolerated treatment well    Behavior During Therapy WFL for tasks assessed/performed             Past Medical History:  Diagnosis Date   Arthritis    DVT (deep venous thrombosis) (HCC) 2013   Dyspnea    due to knee pain   Family history of adverse reaction to anesthesia    Brother and sister hard to wake up.   GERD (gastroesophageal reflux disease)    History of blood transfusion    History of hypertension    Hypertension    Peripheral vascular disease (HCC)    DVT   Pre-diabetes    Pulmonary embolism (HCC) 2013   Past Surgical History:  Procedure Laterality Date   APPENDECTOMY     PULMONARY THROMBECTOMY Bilateral 01/01/2022   Procedure: PULMONARY THROMBECTOMY;  Surgeon: Renford Dills, MD;  Location: [facility];  Service: Cardiovascular;  Laterality: Bilateral;   TONSILLECTOMY AND ADENOIDECTOMY     TOTAL KNEE ARTHROPLASTY Left 12/09/2021   Procedure: LEFT TOTAL KNEE ARTHROPLASTY;  Surgeon: Nadara Mustard, MD;  Location: [facility];  Service: Orthopedics;  Laterality: Left;   Patient Active Problem List   Diagnosis Date Noted   Right groin pain 01/13/2022   Bilateral pulmonary embolism with right heart strain RV/LV ratio 1:5 (HCC) 12/31/2021   Acute respiratory failure with hypoxia (HCC) 12/31/2021   Total knee replacement status, left 12/09/21 12/09/2021   Unilateral primary osteoarthritis, left knee    Shoulder disorder 09/03/2021    Arthritis of knee 09/03/2021   Lumbar radiculopathy 09/03/2021   Abnormal MRI, knee 09/03/2021   Chronic pain of right knee 09/03/2021   Bronchitis 03/25/2021   Anemia 01/09/2020   HTN (hypertension) 12/31/2019   Chest pain 12/31/2019   DOE (dyspnea on exertion) 12/31/2019   Acute coronary syndrome (HCC) 12/31/2019   Unstable angina (HCC) 12/31/2019   Atherosclerosis 08/23/2018   Screening for tuberculosis 07/12/2018   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   Arthralgia 07/12/2018   Solitary pulmonary nodule 07/12/2018   Neck pain 02/15/2018   Dizziness 02/15/2018   Elevated TSH 02/15/2018   Screening for HIV (human immunodeficiency virus) 10/05/2017   Neuropathy 10/05/2017   Screening for colon cancer 10/05/2017   Hyperlipidemia 08/04/2016   Prediabetes 08/04/2016   Back pain 08/04/2016   Bilateral knee pain 06/18/2016   History of DVT of lower extremity 04/01/2014   History of DVT and PE (deep vein thrombosis) 05/04/2011    REFERRING DIAG: X90.240 (ICD-10-CM) - Status post total left   THERAPY DIAG:  Chronic pain of left knee  Difficulty in walking, not elsewhere classified  Muscle weakness (generalized)  Stiffness of left knee, not elsewhere classified  Rationale for Evaluation and Treatment Rehabilitation  PERTINENT HISTORY:  S/P L TKE 12/09/2021. The DVT and PE prevented her from participating in much PT. Had home health PT for  4 weeks. Pt was using SPC R side prior to surgery.  PRECAUTIONS: no known precautions  SUBJECTIVE: Not really pain, just numbness L knee. Getting pain in the R knee though. L knee was ok after last session   PAIN:  Are you having pain? See subjective     TODAY'S TREATMENT:   Manual therapy Reclined STM L posterior thigh to decrease hamstring tension   longitudinal pillow under leg  Seated STM L scar tissue and L lateral knee to decrease fascial restrictions.     Therapeutic exercise Seated L knee AROM at start of session  Extension -14  degrees  Flexion 68 degrees   Reclined quad set 10x5 seconds, longitudinal pillow under leg. 3 sets  Improved seated L knee extension AROM to -10 degrees.   Seated L knee flexion PROM with PT 10x3  Then  AAROM with PT 10x4   72 degrees knee flexion AROM sitting     Seated L knee flexion  stretch with foot on furniture slider   10x5 seconds for 2 sets  78 degrees seated L knee flexion AROM afterwards    Seated L knee extension stretch with foot on furniture slider   10x5 seconds    Improved exercise technique, movement at target joints, use of target muscles after mod verbal, visual, tactile cues.    Response to treatment Pt tolerated session well without aggravation of symptoms. Improved L knee flexion AROM after session.        Clinical impression   Worked on improving L knee flexion and extension ROM to decrease stiffness and improve ability to ambulate and perform transfers with less difficulty. Improved L knee flexion and extension AROM after treatment. Pt tolerated session well without aggravation of symptoms.  Pt will benefit from continued skilled physical therapy to improve ROM, strength and function.        PATIENT EDUCATION: Education details: there-ex, HEP Person educated: Patient Education method: Explanation, Demonstration, Tactile cues, Verbal cues, and Handouts Education comprehension: verbalized understanding and returned demonstration   HOME EXERCISE PROGRAM: Access Code: [email or web address] URL: [email or web address] Date: 02/15/2022 Prepared by: Joneen Boers  Exercises - Seated Heel Slide  - 1 x daily - 7 x weekly - 3 sets - 10 reps - 5 seconds hold       PT Short Term Goals - 02/11/22 1308       PT SHORT TERM GOAL #1   Title Pt will be independent with her initial HEP to improve L knee AROM, strength, and ability to ambulate with less difficulty.    Time 3    Period Weeks    Status New    Target Date 03/04/22               PT Long Term Goals - 02/11/22 1309       PT LONG TERM GOAL #1   Title Pt will be able to ambulate at least 500 ft without AD and no LOB to promote mobility.    Baseline Pt currenty ambulating with rw (02/11/2022)    Time 8    Period Weeks    Status New    Target Date 04/08/22      PT LONG TERM GOAL #2   Title Pt will improve her L knee extension AROM to 0 degrees and knee flexion AROM to at least 115 degrees to promote ability to ambulate and negotiate stairs with less difficulty.    Baseline Seated L knee AROM: -15 degrees extension, 62 degrees flexion (02/11/2022)  Time 8    Period Weeks    Status New    Target Date 04/08/22      PT LONG TERM GOAL #3   Title Pt will improve L knee flexion and extension strength by at least 1/2 MMT to promote ability to ambulate with less difficulty.    Baseline knee flexion 3+/5, extension 4-/5 (02/11/2022)    Time 8    Period Weeks    Status New    Target Date 04/08/22      PT LONG TERM GOAL #4   Title Pt will improve her L knee FOTO score by at least 10 points as a demonstration of improved function.    Baseline L knee FOTO 36 (02/11/2022)    Time 8    Period Weeks    Status New    Target Date 04/08/22              Plan - 02/15/22 1305     Clinical Impression Statement Worked on improving L knee flexion and extension ROM to decrease stiffness and improve ability to ambulate and perform transfers with less difficulty. Improved L knee flexion and extension AROM after treatment. Pt tolerated session well without aggravation of symptoms.  Pt will benefit from continued skilled physical therapy to improve ROM, strength and function.    Personal Factors and Comorbidities Age;Comorbidity 3+;Fitness;Past/Current Experience;Time since onset of injury/illness/exacerbation    Comorbidities DVT, arthritis, dyspnea, HTN, PE    Examination-Activity Limitations Bathing;Stairs;Dressing;Stand;Bend;Lift;Transfers;Carry;Squat;Locomotion Level     Stability/Clinical Decision Making Stable/Uncomplicated    Clinical Decision Making Low    Rehab Potential Fair    PT Frequency 2x / week    PT Duration 8 weeks    PT Treatment/Interventions Therapeutic activities;Therapeutic exercise;Functional mobility training;Balance training;Neuromuscular re-education;Patient/family education;Manual techniques;Dry needling;Aquatic Therapy;Electrical Stimulation;Iontophoresis 4mg /ml Dexamethasone;Gait training;Stair training    PT Next Visit Plan Knee A/AROM, hip and knee strengthening, gait training, manual techniques, modalities PRN    Consulted and Agree with Plan of Care Patient               PT, DPT  02/15/2022, 1:26 PM

## 2022-02-17 ENCOUNTER — Ambulatory Visit: Payer: No Typology Code available for payment source

## 2022-02-17 DIAGNOSIS — G8929 Other chronic pain: Secondary | ICD-10-CM

## 2022-02-17 DIAGNOSIS — M6281 Muscle weakness (generalized): Secondary | ICD-10-CM

## 2022-02-17 DIAGNOSIS — M25662 Stiffness of left knee, not elsewhere classified: Secondary | ICD-10-CM

## 2022-02-17 DIAGNOSIS — R262 Difficulty in walking, not elsewhere classified: Secondary | ICD-10-CM

## 2022-02-17 NOTE — Therapy (Signed)
OUTPATIENT PHYSICAL THERAPY TREATMENT NOTE   Patient Name: Leah Roberts MRN: [id number] DOB:11-May-1950, 71 y.o., female Today's Date: 02/17/2022  PCP: Allegra Grana, FNP REFERRING PROVIDER: Nadara Mustard, MD   PT End of Session - 02/17/22 1213     Visit Number 3    Number of Visits 17    Date for PT Re-Evaluation 04/08/22    Authorization Type 3    Authorization Time Period of 10 progress report    PT Start Time 1213   Pt appt was at 4:30 pm, pt arrived at 12:10 pm   PT Stop Time 1303    PT Time Calculation (min) 50 min    Activity Tolerance Patient tolerated treatment well    Behavior During Therapy [facility] for tasks assessed/performed              Past Medical History:  Diagnosis Date   Arthritis    DVT (deep venous thrombosis) (HCC) 2013   Dyspnea    due to knee pain   Family history of adverse reaction to anesthesia    Brother and sister hard to wake up.   GERD (gastroesophageal reflux disease)    History of blood transfusion    History of hypertension    Hypertension    Peripheral vascular disease (HCC)    DVT   Pre-diabetes    Pulmonary embolism (HCC) 2013   Past Surgical History:  Procedure Laterality Date   APPENDECTOMY     PULMONARY THROMBECTOMY Bilateral 01/01/2022   Procedure: PULMONARY THROMBECTOMY;  Surgeon: Renford Dills, MD;  Location: [facility] IN[facility]IVE [facility];  Service: Cardiovascular;  Laterality: Bilateral;   TONSILLECTOMY AND ADENOIDECTOMY     TOTAL KNEE ARTHROPLASTY Left 12/09/2021   Procedure: LEFT TOTAL KNEE ARTHROPLASTY;  Surgeon: Nadara Mustard, MD;  Location: [facility] OR;  Service: Orthopedics;  Laterality: Left;   Patient Active Problem List   Diagnosis Date Noted   Right groin pain 01/13/2022   Bilateral pulmonary embolism with right heart strain RV/LV ratio 1:5 (HCC) 12/31/2021   Acute respiratory failure with hypoxia (HCC) 12/31/2021   Total knee replacement status, left 12/09/21 12/09/2021   Unilateral primary osteoarthritis,  left knee    Shoulder disorder 09/03/2021   Arthritis of knee 09/03/2021   Lumbar radiculopathy 09/03/2021   Abnormal MRI, knee 09/03/2021   Chronic pain of right knee 09/03/2021   Bronchitis 03/25/2021   Anemia 01/09/2020   HTN (hypertension) 12/31/2019   Chest pain 12/31/2019   DOE (dyspnea on exertion) 12/31/2019   Acute coronary syndrome (HCC) 12/31/2019   Unstable angina (HCC) 12/31/2019   Atherosclerosis 08/23/2018   Screening for tuberculosis 07/12/2018   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   Arthralgia 07/12/2018   Solitary pulmonary nodule 07/12/2018   Neck pain 02/15/2018   Dizziness 02/15/2018   Elevated TSH 02/15/2018   Screening for HIV (human immunodeficiency virus) 10/05/2017   Neuropathy 10/05/2017   Screening for colon cancer 10/05/2017   Hyperlipidemia 08/04/2016   Prediabetes 08/04/2016   Back pain 08/04/2016   Bilateral knee pain 06/18/2016   History of DVT of lower extremity 04/01/2014   History of DVT and PE (deep vein thrombosis) 05/04/2011    REFERRING DIAG: U44.034 (ICD-10-CM) - Status post total left   THERAPY DIAG:  Chronic pain of left knee  Difficulty in walking, not elsewhere classified  Muscle weakness (generalized)  Stiffness of left knee, not elsewhere classified  Rationale for Evaluation and Treatment Rehabilitation  PERTINENT HISTORY:  S/P L TKE 12/09/2021. The DVT and  PE prevented her from participating in much PT. Had home health PT for 4 weeks. Pt was using SPC R side prior to surgery.  PRECAUTIONS: no known precautions  SUBJECTIVE: L knee is ok. The L knee is very stiff in the morning but gets better in the late afternoon when she moves her leg. Felt good and was better able to walk after last session.   PAIN:  Are you having pain? See subjective     TODAY'S TREATMENT:   Manual therapy  Reclined   With L LE propped (3 longitudinal pillows under leg),    effleurage to decrease swelling   STM L posterior thigh to decrease  hamstring muscle tension and  STM vastus lateralis to decrease muscle tension     STM L scar tissue and L lateral knee to decrease fascial restrictions.     Therapeutic exercise Seated L knee AROM at start of session  Extension -14 degrees  Flexion 64 degrees   Reclined quad set 10x5 seconds, 3 longitudinal pillows under leg. 3 sets   Seated L knee flexion PROM with PT 10x3  Then  AAROM with PT 10x4   75 degrees knee flexion AROM sitting   Gait with SPC R side 40 ft. Slight unsteadiness. CGA. Cues for proper technique.   Gait with rw, emphasis on increasing L knee flexion during swing phase and knee extension during stance phase.     Improved exercise technique, movement at target joints, use of target muscles after min to mod verbal, visual, tactile cues.    Response to treatment Pt tolerated session well without aggravation of symptoms.     Clinical impression   Worked on improving L knee flexion and extension ROM to decrease stiffness and improve ability to ambulate and perform transfers with less difficulty. Improved L knee flexion AROM after treatment. Some unsteadiness with gait with SPC. More practice needed.  Pt tolerated session well without aggravation of symptoms.  Pt will benefit from continued skilled physical therapy to improve ROM, strength and function.        PATIENT EDUCATION: Education details: there-ex, HEP Person educated: Patient Education method: Explanation, Demonstration, Tactile cues, Verbal cues, and Handouts Education comprehension: verbalized understanding and returned demonstration   HOME EXERCISE PROGRAM: Access Code: [email or web address] URL: [email or web address] Date: 02/15/2022 Prepared by: Joneen Boers  Exercises - Seated Heel Slide  - 1 x daily - 7 x weekly - 3 sets - 10 reps - 5 seconds hold       PT Short Term Goals - 02/11/22 1308       PT SHORT TERM GOAL #1   Title Pt will be independent with her initial HEP to  improve L knee AROM, strength, and ability to ambulate with less difficulty.    Time 3    Period Weeks    Status New    Target Date 03/04/22              PT Long Term Goals - 02/11/22 1309       PT LONG TERM GOAL #1   Title Pt will be able to ambulate at least 500 ft without AD and no LOB to promote mobility.    Baseline Pt currenty ambulating with rw (02/11/2022)    Time 8    Period Weeks    Status New    Target Date 04/08/22      PT LONG TERM GOAL #2   Title Pt will improve her L knee extension AROM to 0 degrees  and knee flexion AROM to at least 115 degrees to promote ability to ambulate and negotiate stairs with less difficulty.    Baseline Seated L knee AROM: -15 degrees extension, 62 degrees flexion (02/11/2022)    Time 8    Period Weeks    Status New    Target Date 04/08/22      PT LONG TERM GOAL #3   Title Pt will improve L knee flexion and extension strength by at least 1/2 MMT to promote ability to ambulate with less difficulty.    Baseline knee flexion 3+/5, extension 4-/5 (02/11/2022)    Time 8    Period Weeks    Status New    Target Date 04/08/22      PT LONG TERM GOAL #4   Title Pt will improve her L knee FOTO score by at least 10 points as a demonstration of improved function.    Baseline L knee FOTO 36 (02/11/2022)    Time 8    Period Weeks    Status New    Target Date 04/08/22              Plan - 02/17/22 1323     Clinical Impression Statement Worked on improving L knee flexion and extension ROM to decrease stiffness and improve ability to ambulate and perform transfers with less difficulty. Improved L knee flexion AROM after treatment. Some unsteadiness with gait with SPC. More practice needed.  Pt tolerated session well without aggravation of symptoms.  Pt will benefit from continued skilled physical therapy to improve ROM, strength and function.    Personal Factors and Comorbidities Age;Comorbidity 3+;Fitness;Past/Current Experience;Time  since onset of injury/illness/exacerbation    Comorbidities DVT, arthritis, dyspnea, HTN, PE    Examination-Activity Limitations Bathing;Stairs;Dressing;Stand;Bend;Lift;Transfers;Carry;Squat;Locomotion Level    Stability/Clinical Decision Making Stable/Uncomplicated    Rehab Potential Fair    PT Frequency 2x / week    PT Duration 8 weeks    PT Treatment/Interventions Therapeutic activities;Therapeutic exercise;Functional mobility training;Balance training;Neuromuscular re-education;Patient/family education;Manual techniques;Dry needling;Aquatic Therapy;Electrical Stimulation;Iontophoresis 4mg /ml Dexamethasone;Gait training;Stair training    PT Next Visit Plan Knee A/AROM, hip and knee strengthening, gait training, manual techniques, modalities PRN    Consulted and Agree with Plan of Care Patient                PT, DPT  02/17/2022, 1:25 PM

## 2022-02-18 ENCOUNTER — Other Ambulatory Visit: Payer: Self-pay

## 2022-02-18 ENCOUNTER — Telehealth: Payer: Self-pay | Admitting: Family

## 2022-02-18 ENCOUNTER — Other Ambulatory Visit: Payer: Self-pay | Admitting: Orthopedic Surgery

## 2022-02-18 ENCOUNTER — Other Ambulatory Visit: Payer: Self-pay | Admitting: Family

## 2022-02-18 MED ORDER — APIXABAN 5 MG PO TABS
5.0000 mg | ORAL_TABLET | Freq: Two times a day (BID) | ORAL | 1 refills | Status: DC
  Filled 2022-02-18: qty 180, 90d supply, fill #0
  Filled 2022-05-26: qty 60, 30d supply, fill #1
  Filled 2022-06-15: qty 60, 30d supply, fill #2

## 2022-02-18 MED ORDER — OXYCODONE-ACETAMINOPHEN 5-325 MG PO TABS
1.0000 | ORAL_TABLET | Freq: Three times a day (TID) | ORAL | 0 refills | Status: DC | PRN
  Filled 2022-02-18: qty 21, 7d supply, fill #0

## 2022-02-18 MED ORDER — APIXABAN 5 MG PO TABS
ORAL_TABLET | ORAL | 0 refills | Status: DC
  Filled 2022-02-18: qty 74, 30d supply, fill #0

## 2022-02-18 NOTE — Telephone Encounter (Signed)
Oxycodone last filled 02/05/22 #21 12/09/21 Left TKA and pt is still currently in out patient PT.

## 2022-02-18 NOTE — Telephone Encounter (Signed)
Patient would like Oxycodone refilled community [facility]

## 2022-02-19 ENCOUNTER — Other Ambulatory Visit: Payer: Self-pay

## 2022-02-22 ENCOUNTER — Ambulatory Visit: Payer: No Typology Code available for payment source

## 2022-02-22 ENCOUNTER — Ambulatory Visit (INDEPENDENT_AMBULATORY_CARE_PROVIDER_SITE_OTHER): Payer: No Typology Code available for payment source

## 2022-02-22 DIAGNOSIS — M6281 Muscle weakness (generalized): Secondary | ICD-10-CM

## 2022-02-22 DIAGNOSIS — G8929 Other chronic pain: Secondary | ICD-10-CM

## 2022-02-22 DIAGNOSIS — R262 Difficulty in walking, not elsewhere classified: Secondary | ICD-10-CM

## 2022-02-22 DIAGNOSIS — Z23 Encounter for immunization: Secondary | ICD-10-CM

## 2022-02-22 DIAGNOSIS — M25662 Stiffness of left knee, not elsewhere classified: Secondary | ICD-10-CM

## 2022-02-22 NOTE — Therapy (Signed)
OUTPATIENT PHYSICAL THERAPY TREATMENT NOTE   Patient Name: Leah Roberts MRN: [id number] DOB:[birth year], 71 y.o., female Today's Date: 02/22/2022  PCP: Burnard Hawthorne, FNP REFERRING PROVIDER: Newt Minion, MD   PT End of Session - 02/22/22 1721     Visit Number 4    Number of Visits 17    Date for PT Re-Evaluation 04/08/22    Authorization Type 4    Authorization Time Period of 10 progress report    PT Start Time 1721    PT Stop Time 1803    PT Time Calculation (min) 42 min    Activity Tolerance Patient tolerated treatment well    Behavior During Therapy WFL for tasks assessed/performed               Past Medical History:  Diagnosis Date   Arthritis    DVT (deep venous thrombosis) ([place]) 2013   Dyspnea    due to knee pain   Family history of adverse reaction to anesthesia    Brother and sister hard to wake up.   GERD (gastroesophageal reflux disease)    History of blood transfusion    History of hypertension    Hypertension    Peripheral vascular disease ([place])    DVT   Pre-diabetes    Pulmonary embolism ([place]) 2013   Past Surgical History:  Procedure Laterality Date   APPENDECTOMY     PULMONARY THROMBECTOMY Bilateral 01/01/2022   Procedure: PULMONARY THROMBECTOMY;  Surgeon: Katha Cabal, MD;  Location: [facility];  Service: Cardiovascular;  Laterality: Bilateral;   TONSILLECTOMY AND ADENOIDECTOMY     TOTAL KNEE ARTHROPLASTY Left 12/09/2021   Procedure: LEFT TOTAL KNEE ARTHROPLASTY;  Surgeon: Newt Minion, MD;  Location: [facility];  Service: Orthopedics;  Laterality: Left;   Patient Active Problem List   Diagnosis Date Noted   Right groin pain 01/13/2022   Bilateral pulmonary embolism with right heart strain RV/LV ratio 1:5 ([place]) 12/31/2021   Acute respiratory failure with hypoxia ([place]) 12/31/2021   Total knee replacement status, left 12/09/21 12/09/2021   Unilateral primary osteoarthritis, left knee    Shoulder disorder 09/03/2021    Arthritis of knee 09/03/2021   Lumbar radiculopathy 09/03/2021   Abnormal MRI, knee 09/03/2021   Chronic pain of right knee 09/03/2021   Bronchitis 03/25/2021   Anemia 01/09/2020   HTN (hypertension) 12/31/2019   Chest pain 12/31/2019   DOE (dyspnea on exertion) 12/31/2019   Acute coronary syndrome ([place]) 12/31/2019   Unstable angina ([place]) 12/31/2019   Atherosclerosis 08/23/2018   Screening for tuberculosis 07/12/2018   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   Arthralgia 07/12/2018   Solitary pulmonary nodule 07/12/2018   Neck pain 02/15/2018   Dizziness 02/15/2018   Elevated TSH 02/15/2018   Screening for HIV (human immunodeficiency virus) 10/05/2017   Neuropathy 10/05/2017   Screening for colon cancer 10/05/2017   Hyperlipidemia 08/04/2016   Prediabetes 08/04/2016   Back pain 08/04/2016   Bilateral knee pain 06/18/2016   History of DVT of lower extremity 04/01/2014   History of DVT and PE (deep vein thrombosis) 05/04/2011    REFERRING DIAG: F63.846 (ICD-10-CM) - Status post total left   THERAPY DIAG:  Chronic pain of left knee  Difficulty in walking, not elsewhere classified  Muscle weakness (generalized)  Stiffness of left knee, not elsewhere classified  Rationale for Evaluation and Treatment Rehabilitation  PERTINENT HISTORY:  S/P L TKE 12/09/2021. The DVT and PE prevented her from participating in much PT. Had home health  PT for 4 weeks. Pt was using SPC R side prior to surgery.  PRECAUTIONS: no known precautions  SUBJECTIVE: L knee is ok. Stiffness, a little pain, has been walking at [employer]. Not really any pain when walking from waiting room to treatment room. Stiff.    PAIN:  Are you having pain? See subjective     TODAY'S TREATMENT:   Manual therapy    Seated STM L anterior, medial, and lateral knee to decrease fascial restrictions    Therapeutic exercise  Sit <> stand from chair with arms, B UE assist, cues to not push chair back as well as bringing body  weight on top of base of support. 2x then  5x  Seated knee flexion stretch with furniture slider   L 10x5 seconds for 2 sets   Seated L knee flexion PROM with PT 10x3  Then  AAROM with PT 10x 2 with 3 second holds    77 degrees knee flexion AROM sitting  Sit to stand from low mat table, cues on proper technique 1x   Gait with rw, emphasis on increasing L knee flexion during swing phase and knee extension during stance phase.     Improved exercise technique, movement at target joints, use of target muscles after min to mod verbal, visual, tactile cues.       Response to treatment Pt tolerated session well without aggravation of symptoms.     Clinical impression   Worked on improving forward weight shifting to place her body weight on top of her base of support (feet) to decrease difficulty with sit to stand and improve safety with the transfer. Cues also needed to not push the chair back as she is standing up to decrease fall risk. Improved safety and decreased difficulty with the transfer after session. Pt also demonstrates L knee stiffness both with the fascia and joint. Continued with manual therapy to improve fascial mobility. Continued with knee flexion ROM exercises to decrease stiffness. Also cued pt to increase knee flexion during swing phase and knee extension during stance phase of gait to maintain range of motion gains. Pt able to achieve 77 degrees L knee flexion AROM after treatment.  Pt will benefit from continued skilled physical therapy to improve ROM, strength and function.        PATIENT EDUCATION: Education details: there-ex, HEP Person educated: Patient Education method: Explanation, Demonstration, Tactile cues, Verbal cues, and Handouts Education comprehension: verbalized understanding and returned demonstration   HOME EXERCISE PROGRAM: Access Code: [email or web address] URL: [email or web address] Date: 02/15/2022 Prepared by: Joneen Boers  Exercises - Seated Heel Slide  - 1 x daily - 7 x weekly - 3 sets - 10 reps - 5 seconds hold       PT Short Term Goals - 02/11/22 1308       PT SHORT TERM GOAL #1   Title Pt will be independent with her initial HEP to improve L knee AROM, strength, and ability to ambulate with less difficulty.    Time 3    Period Weeks    Status New    Target Date 03/04/22              PT Long Term Goals - 02/11/22 1309       PT LONG TERM GOAL #1   Title Pt will be able to ambulate at least 500 ft without AD and no LOB to promote mobility.    Baseline Pt currenty ambulating with rw (02/11/2022)    Time 8  Period Weeks    Status New    Target Date 04/08/22      PT LONG TERM GOAL #2   Title Pt will improve her L knee extension AROM to 0 degrees and knee flexion AROM to at least 115 degrees to promote ability to ambulate and negotiate stairs with less difficulty.    Baseline Seated L knee AROM: -15 degrees extension, 62 degrees flexion (02/11/2022)    Time 8    Period Weeks    Status New    Target Date 04/08/22      PT LONG TERM GOAL #3   Title Pt will improve L knee flexion and extension strength by at least 1/2 MMT to promote ability to ambulate with less difficulty.    Baseline knee flexion 3+/5, extension 4-/5 (02/11/2022)    Time 8    Period Weeks    Status New    Target Date 04/08/22      PT LONG TERM GOAL #4   Title Pt will improve her L knee FOTO score by at least 10 points as a demonstration of improved function.    Baseline L knee FOTO 36 (02/11/2022)    Time 8    Period Weeks    Status New    Target Date 04/08/22              Plan - 02/22/22 1815     Clinical Impression Statement Worked on improving forward weight shifting to place her body weight on top of her base of support (feet) to decrease difficulty with sit to stand and improve safety with the transfer. Cues also needed to not push the chair back as she is standing up to decrease fall risk.  Improved safety and decreased difficulty with the transfer after session. Pt also demonstrates L knee stiffness both with the fascia and joint. Continued with manual therapy to improve fascial mobility. Continued with knee flexion ROM exercises to decrease stiffness. Also cued pt to increase knee flexion during swing phase and knee extension during stance phase of gait to maintain range of motion gains. Pt able to achieve 77 degrees L knee flexion AROM after treatment.  Pt will benefit from continued skilled physical therapy to improve ROM, strength and function.    Personal Factors and Comorbidities Age;Comorbidity 3+;Fitness;Past/Current Experience;Time since onset of injury/illness/exacerbation    Comorbidities DVT, arthritis, dyspnea, HTN, PE    Examination-Activity Limitations Bathing;Stairs;Dressing;Stand;Bend;Lift;Transfers;Carry;Squat;Locomotion Level    Stability/Clinical Decision Making Stable/Uncomplicated    Rehab Potential Fair    PT Frequency 2x / week    PT Duration 8 weeks    PT Treatment/Interventions Therapeutic activities;Therapeutic exercise;Functional mobility training;Balance training;Neuromuscular re-education;Patient/family education;Manual techniques;Dry needling;Aquatic Therapy;Electrical Stimulation;Iontophoresis 4mg /ml Dexamethasone;Gait training;Stair training    PT Next Visit Plan Knee A/AROM, hip and knee strengthening, gait training, manual techniques, modalities PRN    Consulted and Agree with Plan of Care Patient                 Joneen Boers PT, DPT  02/22/2022, 6:16 PM

## 2022-02-24 ENCOUNTER — Ambulatory Visit: Payer: No Typology Code available for payment source

## 2022-02-24 DIAGNOSIS — R262 Difficulty in walking, not elsewhere classified: Secondary | ICD-10-CM

## 2022-02-24 DIAGNOSIS — G8929 Other chronic pain: Secondary | ICD-10-CM

## 2022-02-24 DIAGNOSIS — M25662 Stiffness of left knee, not elsewhere classified: Secondary | ICD-10-CM

## 2022-02-24 DIAGNOSIS — M6281 Muscle weakness (generalized): Secondary | ICD-10-CM

## 2022-02-24 NOTE — Therapy (Signed)
OUTPATIENT PHYSICAL THERAPY TREATMENT NOTE   Patient Name: Leah Roberts MRN: [id number] DOB:[birth year], 71 y.o., female Today's Date: 02/24/2022  PCP: Allegra Grana, FNP REFERRING PROVIDER: Nadara Mustard, MD   PT End of Session - 02/24/22 1721     Visit Number 5    Number of Visits 17    Date for PT Re-Evaluation 04/08/22    Authorization Type 5    Authorization Time Period of 10 progress report    PT Start Time 1721    PT Stop Time 1803    PT Time Calculation (min) 42 min    Activity Tolerance Patient tolerated treatment well    Behavior During Therapy WFL for tasks assessed/performed                Past Medical History:  Diagnosis Date   Arthritis    DVT (deep venous thrombosis) (HCC) 2013   Dyspnea    due to knee pain   Family history of adverse reaction to anesthesia    Brother and sister hard to wake up.   GERD (gastroesophageal reflux disease)    History of blood transfusion    History of hypertension    Hypertension    Peripheral vascular disease (HCC)    DVT   Pre-diabetes    Pulmonary embolism (HCC) 2013   Past Surgical History:  Procedure Laterality Date   APPENDECTOMY     PULMONARY THROMBECTOMY Bilateral 01/01/2022   Procedure: PULMONARY THROMBECTOMY;  Surgeon: Renford Dills, MD;  Location: [facility];  Service: Cardiovascular;  Laterality: Bilateral;   TONSILLECTOMY AND ADENOIDECTOMY     TOTAL KNEE ARTHROPLASTY Left 12/09/2021   Procedure: LEFT TOTAL KNEE ARTHROPLASTY;  Surgeon: Nadara Mustard, MD;  Location: [facility] OR;  Service: Orthopedics;  Laterality: Left;   Patient Active Problem List   Diagnosis Date Noted   Right groin pain 01/13/2022   Bilateral pulmonary embolism with right heart strain RV/LV ratio 1:5 (HCC) 12/31/2021   Acute respiratory failure with hypoxia (HCC) 12/31/2021   Total knee replacement status, left 12/09/21 12/09/2021   Unilateral primary osteoarthritis, left knee    Shoulder disorder 09/03/2021    Arthritis of knee 09/03/2021   Lumbar radiculopathy 09/03/2021   Abnormal MRI, knee 09/03/2021   Chronic pain of right knee 09/03/2021   Bronchitis 03/25/2021   Anemia 01/09/2020   HTN (hypertension) 12/31/2019   Chest pain 12/31/2019   DOE (dyspnea on exertion) 12/31/2019   Acute coronary syndrome (HCC) 12/31/2019   Unstable angina (HCC) 12/31/2019   Atherosclerosis 08/23/2018   Screening for tuberculosis 07/12/2018   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   Arthralgia 07/12/2018   Solitary pulmonary nodule 07/12/2018   Neck pain 02/15/2018   Dizziness 02/15/2018   Elevated TSH 02/15/2018   Screening for HIV (human immunodeficiency virus) 10/05/2017   Neuropathy 10/05/2017   Screening for colon cancer 10/05/2017   Hyperlipidemia 08/04/2016   Prediabetes 08/04/2016   Back pain 08/04/2016   Bilateral knee pain 06/18/2016   History of DVT of lower extremity 04/01/2014   History of DVT and PE (deep vein thrombosis) 05/04/2011    REFERRING DIAG: Q59.563 (ICD-10-CM) - Status post total left   THERAPY DIAG:  Chronic pain of left knee  Difficulty in walking, not elsewhere classified  Muscle weakness (generalized)  Stiffness of left knee, not elsewhere classified  Rationale for Evaluation and Treatment Rehabilitation  PERTINENT HISTORY:  S/P L TKE 12/09/2021. The DVT and PE prevented her from participating in much PT. Had home  health PT for 4 weeks. Pt was using SPC R side prior to surgery.  PRECAUTIONS: no known precautions  SUBJECTIVE: L knee is getting better at times. Last night L knee is horrible, did not sleep well. Sometimes her L knee is ok. 5/10 L knee pain when walking. Also feels stiff.    PAIN:  Are you having pain? See subjective     TODAY'S TREATMENT:   Manual therapy   Seated STM L lateral knee to decrease fascial restrictions Seated STM L vastus lateralis to decrease tension   Improved L knee comfort reported afterwards  Seated L knee flexion AAROM 80  degrees afterwards.   Therapeutic exercise  Sit <> stand from chair with arms, B UE assist, cues to not push chair back as well as bringing body weight on top of base of support. 5x   Seated with L foot propped on stool  L quad set 10x5 seconds for 2 sets  Seated L heel slides with furniture slider (for knee flexion ) 10x5 seconds   Standing L knee flexion stretch on stair 10x5 seconds  Standing L TKE with B UE assist 10x5 seconds  Gait with rw, emphasis on increasing L knee flexion during swing phase and knee extension during stance phase.     Improved exercise technique, movement at target joints, use of target muscles after min to mod verbal, visual, tactile cues.       Response to treatment Pt tolerated session well without aggravation of symptoms.     Clinical impression   Pt demonstrates L vastus lateralis muscle tension and decreased L lateral knee fascial mobility limiting knee flexion ROM. Performed manual therapy to help address resulting in improved comfort and flexibility reported by pt afterwards. Continued working on knee flexion and extension ROM followed by incorporation of ROM gains during gait to help maintain progress with ROM. Pt tolerated session well without aggravation of symptoms. Pt will benefit from continued skilled physical therapy to improve ROM, strength and function.        PATIENT EDUCATION: Education details: there-ex, HEP Person educated: Patient Education method: Explanation, Demonstration, Tactile cues, Verbal cues, and Handouts Education comprehension: verbalized understanding and returned demonstration   HOME EXERCISE PROGRAM: Access Code: [email or web address] URL: [email or web address] Date: 02/15/2022 Prepared by: Loralyn Freshwater  Exercises - Seated Heel Slide  - 1 x daily - 7 x weekly - 3 sets - 10 reps - 5 seconds hold       PT Short Term Goals - 02/11/22 1308       PT SHORT TERM GOAL #1   Title Pt will be  independent with her initial HEP to improve L knee AROM, strength, and ability to ambulate with less difficulty.    Time 3    Period Weeks    Status New    Target Date 03/04/22              PT Long Term Goals - 02/11/22 1309       PT LONG TERM GOAL #1   Title Pt will be able to ambulate at least 500 ft without AD and no LOB to promote mobility.    Baseline Pt currenty ambulating with rw (02/11/2022)    Time 8    Period Weeks    Status New    Target Date 04/08/22      PT LONG TERM GOAL #2   Title Pt will improve her L knee extension AROM to 0 degrees and knee flexion AROM to at  least 115 degrees to promote ability to ambulate and negotiate stairs with less difficulty.    Baseline Seated L knee AROM: -15 degrees extension, 62 degrees flexion (02/11/2022)    Time 8    Period Weeks    Status New    Target Date 04/08/22      PT LONG TERM GOAL #3   Title Pt will improve L knee flexion and extension strength by at least 1/2 MMT to promote ability to ambulate with less difficulty.    Baseline knee flexion 3+/5, extension 4-/5 (02/11/2022)    Time 8    Period Weeks    Status New    Target Date 04/08/22      PT LONG TERM GOAL #4   Title Pt will improve her L knee FOTO score by at least 10 points as a demonstration of improved function.    Baseline L knee FOTO 36 (02/11/2022)    Time 8    Period Weeks    Status New    Target Date 04/08/22              Plan - 02/24/22 1720     Clinical Impression Statement Pt demonstrates L vastus lateralis muscle tension and decreased L lateral knee fascial mobility limiting knee flexion ROM. Performed manual therapy to help address resulting in improved comfort and flexibility reported by pt afterwards. Continued working on knee flexion and extension ROM followed by incorporation of ROM gains during gait to help maintain progress with ROM. Pt tolerated session well without aggravation of symptoms. Pt will benefit from continued skilled  physical therapy to improve ROM, strength and function.    Personal Factors and Comorbidities Age;Comorbidity 3+;Fitness;Past/Current Experience;Time since onset of injury/illness/exacerbation    Comorbidities DVT, arthritis, dyspnea, HTN, PE    Examination-Activity Limitations Bathing;Stairs;Dressing;Stand;Bend;Lift;Transfers;Carry;Squat;Locomotion Level    Stability/Clinical Decision Making Stable/Uncomplicated    Clinical Decision Making Low    Rehab Potential Fair    PT Frequency 2x / week    PT Duration 8 weeks    PT Treatment/Interventions Therapeutic activities;Therapeutic exercise;Functional mobility training;Balance training;Neuromuscular re-education;Patient/family education;Manual techniques;Dry needling;Aquatic Therapy;Electrical Stimulation;Iontophoresis 4mg /ml Dexamethasone;Gait training;Stair training    PT Next Visit Plan Knee A/AROM, hip and knee strengthening, gait training, manual techniques, modalities PRN    Consulted and Agree with Plan of Care Patient                  Joneen Boers PT, DPT  02/24/2022, 6:13 PM

## 2022-03-02 ENCOUNTER — Ambulatory Visit: Payer: No Typology Code available for payment source

## 2022-03-02 DIAGNOSIS — M25662 Stiffness of left knee, not elsewhere classified: Secondary | ICD-10-CM

## 2022-03-02 DIAGNOSIS — R262 Difficulty in walking, not elsewhere classified: Secondary | ICD-10-CM

## 2022-03-02 DIAGNOSIS — M6281 Muscle weakness (generalized): Secondary | ICD-10-CM

## 2022-03-02 DIAGNOSIS — G8929 Other chronic pain: Secondary | ICD-10-CM

## 2022-03-02 NOTE — Therapy (Signed)
OUTPATIENT PHYSICAL THERAPY TREATMENT NOTE   Patient Name: Leah Roberts MRN: [id number] DOB:1950/09/03, 71 y.o., female Today's Date: 03/02/2022  PCP: Allegra Grana, FNP REFERRING PROVIDER: Nadara Mustard, MD   PT End of Session - 03/02/22 1026     Visit Number 6    Number of Visits 17    Date for PT Re-Evaluation 04/08/22    Authorization Type 6    Authorization Time Period of 10 progress report    PT Start Time 1026    PT Stop Time 1058    PT Time Calculation (min) 32 min    Activity Tolerance Patient tolerated treatment well    Behavior During Therapy WFL for tasks assessed/performed                 Past Medical History:  Diagnosis Date   Arthritis    DVT (deep venous thrombosis) (HCC) 2013   Dyspnea    due to knee pain   Family history of adverse reaction to anesthesia    Brother and sister hard to wake up.   GERD (gastroesophageal reflux disease)    History of blood transfusion    History of hypertension    Hypertension    Peripheral vascular disease (HCC)    DVT   Pre-diabetes    Pulmonary embolism (HCC) 2013   Past Surgical History:  Procedure Laterality Date   APPENDECTOMY     PULMONARY THROMBECTOMY Bilateral 01/01/2022   Procedure: PULMONARY THROMBECTOMY;  Surgeon: Renford Dills, MD;  Location: [facility];  Service: Cardiovascular;  Laterality: Bilateral;   TONSILLECTOMY AND ADENOIDECTOMY     TOTAL KNEE ARTHROPLASTY Left 12/09/2021   Procedure: LEFT TOTAL KNEE ARTHROPLASTY;  Surgeon: Nadara Mustard, MD;  Location: [facility] OR;  Service: Orthopedics;  Laterality: Left;   Patient Active Problem List   Diagnosis Date Noted   Right groin pain 01/13/2022   Bilateral pulmonary embolism with right heart strain RV/LV ratio 1:5 (HCC) 12/31/2021   Acute respiratory failure with hypoxia (HCC) 12/31/2021   Total knee replacement status, left 12/09/21 12/09/2021   Unilateral primary osteoarthritis, left knee    Shoulder disorder 09/03/2021    Arthritis of knee 09/03/2021   Lumbar radiculopathy 09/03/2021   Abnormal MRI, knee 09/03/2021   Chronic pain of right knee 09/03/2021   Bronchitis 03/25/2021   Anemia 01/09/2020   HTN (hypertension) 12/31/2019   Chest pain 12/31/2019   DOE (dyspnea on exertion) 12/31/2019   Acute coronary syndrome (HCC) 12/31/2019   Unstable angina (HCC) 12/31/2019   Atherosclerosis 08/23/2018   Screening for tuberculosis 07/12/2018   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   Arthralgia 07/12/2018   Solitary pulmonary nodule 07/12/2018   Neck pain 02/15/2018   Dizziness 02/15/2018   Elevated TSH 02/15/2018   Screening for HIV (human immunodeficiency virus) 10/05/2017   Neuropathy 10/05/2017   Screening for colon cancer 10/05/2017   Hyperlipidemia 08/04/2016   Prediabetes 08/04/2016   Back pain 08/04/2016   Bilateral knee pain 06/18/2016   History of DVT of lower extremity 04/01/2014   History of DVT and PE (deep vein thrombosis) 05/04/2011    REFERRING DIAG: F79.038 (ICD-10-CM) - Status post total left   THERAPY DIAG:  Chronic pain of left knee  Difficulty in walking, not elsewhere classified  Muscle weakness (generalized)  Stiffness of left knee, not elsewhere classified  Rationale for Evaluation and Treatment Rehabilitation  PERTINENT HISTORY:  S/P L TKE 12/09/2021. The DVT and PE prevented her from participating in much PT. Had  home health PT for 4 weeks. Pt was using SPC R side prior to surgery.  PRECAUTIONS: no known precautions  SUBJECTIVE: L knee is better today than yesterday. 6/10 L knee pain currently.  Feels like something is blocking the knee from bending.    PAIN:  Are you having pain? See subjective     TODAY'S TREATMENT:     Therapeutic exercise  NuStep, seat 8, arms 8, level 1 for 5 minutes to promote L knee flexion and movement.   Seated L knee AROM  -11 degrees extension   70 degrees flexion  Gait with rw but R hand assist only 50 ft   Gait with SPC on R side  50 ft with CGA. Cues to improve L hip and knee flexion for L foot clearance. Slight unsteadiness.   Then 100 ft CGA   Forward step up onto 4 inch step with B UE assist   L 10x3  Standing TKE with B UE assist   L 10x5 seconds for 3 sets       Improved exercise technique, movement at target joints, use of target muscles after min to mod verbal, visual, tactile cues.       Response to treatment Pt tolerated session well without aggravation of symptoms.     Clinical impression   Pt arrived late so session was adjusted accordingly. Worked on gait training with SPC to help transition pt to least restrictive assistive device. Also worked on functional strengthening to decrease difficulty negotiating curbs and stairs. Pt tolerated session well without aggravation of symptoms. Pt will benefit from continued skilled physical therapy to improve ROM, strength and function.        PATIENT EDUCATION: Education details: there-ex, HEP Person educated: Patient Education method: Explanation, Demonstration, Tactile cues, Verbal cues, and Handouts Education comprehension: verbalized understanding and returned demonstration   HOME EXERCISE PROGRAM: Access Code: [email or web address] URL: [email or web address] Date: 02/15/2022 Prepared by: Loralyn Freshwater  Exercises - Seated Heel Slide  - 1 x daily - 7 x weekly - 3 sets - 10 reps - 5 seconds hold       PT Short Term Goals - 02/11/22 1308       PT SHORT TERM GOAL #1   Title Pt will be independent with her initial HEP to improve L knee AROM, strength, and ability to ambulate with less difficulty.    Time 3    Period Weeks    Status New    Target Date 03/04/22              PT Long Term Goals - 02/11/22 1309       PT LONG TERM GOAL #1   Title Pt will be able to ambulate at least 500 ft without AD and no LOB to promote mobility.    Baseline Pt currenty ambulating with rw (02/11/2022)    Time 8    Period Weeks    Status  New    Target Date 04/08/22      PT LONG TERM GOAL #2   Title Pt will improve her L knee extension AROM to 0 degrees and knee flexion AROM to at least 115 degrees to promote ability to ambulate and negotiate stairs with less difficulty.    Baseline Seated L knee AROM: -15 degrees extension, 62 degrees flexion (02/11/2022)    Time 8    Period Weeks    Status New    Target Date 04/08/22      PT LONG TERM GOAL #3  Title Pt will improve L knee flexion and extension strength by at least 1/2 MMT to promote ability to ambulate with less difficulty.    Baseline knee flexion 3+/5, extension 4-/5 (02/11/2022)    Time 8    Period Weeks    Status New    Target Date 04/08/22      PT LONG TERM GOAL #4   Title Pt will improve her L knee FOTO score by at least 10 points as a demonstration of improved function.    Baseline L knee FOTO 36 (02/11/2022)    Time 8    Period Weeks    Status New    Target Date 04/08/22              Plan - 03/02/22 1030     Clinical Impression Statement Pt arrived late so session was adjusted accordingly. Worked on gait training with SPC to help transition pt to least restrictive assistive device. Also worked on functional strengthening to decrease difficulty negotiating curbs and stairs. Pt tolerated session well without aggravation of symptoms. Pt will benefit from continued skilled physical therapy to improve ROM, strength and function.    Personal Factors and Comorbidities Age;Comorbidity 3+;Fitness;Past/Current Experience;Time since onset of injury/illness/exacerbation    Comorbidities DVT, arthritis, dyspnea, HTN, PE    Examination-Activity Limitations Bathing;Stairs;Dressing;Stand;Bend;Lift;Transfers;Carry;Squat;Locomotion Level    Stability/Clinical Decision Making Stable/Uncomplicated    Clinical Decision Making Low    Rehab Potential Fair    PT Frequency 2x / week    PT Duration 8 weeks    PT Treatment/Interventions Therapeutic activities;Therapeutic  exercise;Functional mobility training;Balance training;Neuromuscular re-education;Patient/family education;Manual techniques;Dry needling;Aquatic Therapy;Electrical Stimulation;Iontophoresis 4mg /ml Dexamethasone;Gait training;Stair training    PT Next Visit Plan Knee A/AROM, hip and knee strengthening, gait training, manual techniques, modalities PRN    Consulted and Agree with Plan of Care Patient                   Joneen Boers PT, DPT  03/02/2022, 4:26 PM

## 2022-03-04 ENCOUNTER — Telehealth: Payer: Self-pay | Admitting: Family

## 2022-03-04 ENCOUNTER — Ambulatory Visit: Payer: No Typology Code available for payment source | Attending: Orthopedic Surgery

## 2022-03-04 DIAGNOSIS — M6281 Muscle weakness (generalized): Secondary | ICD-10-CM | POA: Insufficient documentation

## 2022-03-04 DIAGNOSIS — G8929 Other chronic pain: Secondary | ICD-10-CM | POA: Insufficient documentation

## 2022-03-04 DIAGNOSIS — M25662 Stiffness of left knee, not elsewhere classified: Secondary | ICD-10-CM | POA: Insufficient documentation

## 2022-03-04 DIAGNOSIS — R262 Difficulty in walking, not elsewhere classified: Secondary | ICD-10-CM | POA: Insufficient documentation

## 2022-03-04 DIAGNOSIS — M25562 Pain in left knee: Secondary | ICD-10-CM | POA: Insufficient documentation

## 2022-03-04 NOTE — Telephone Encounter (Signed)
Patient has a lab appt 03/08/2022, there are no orders in. 

## 2022-03-04 NOTE — Therapy (Signed)
OUTPATIENT PHYSICAL THERAPY TREATMENT NOTE   Patient Name: Leah Roberts MRN: [id number] DOB:1951/02/10, 71 y.o., female Today's Date: 03/04/2022  PCP: Burnard Hawthorne, FNP REFERRING PROVIDER: Newt Minion, MD   PT End of Session - 03/04/22 1552     Visit Number 7    Number of Visits 17    Date for PT Re-Evaluation 04/08/22    Authorization Type 6    Authorization Time Period of 10 progress report    PT Start Time 1550    PT Stop Time 1632    PT Time Calculation (min) 42 min    Activity Tolerance Patient tolerated treatment well    Behavior During Therapy WFL for tasks assessed/performed                 Past Medical History:  Diagnosis Date   Arthritis    DVT (deep venous thrombosis) ([place]) 2013   Dyspnea    due to knee pain   Family history of adverse reaction to anesthesia    Brother and sister hard to wake up.   GERD (gastroesophageal reflux disease)    History of blood transfusion    History of hypertension    Hypertension    Peripheral vascular disease ([place])    DVT   Pre-diabetes    Pulmonary embolism ([place]) 2013   Past Surgical History:  Procedure Laterality Date   APPENDECTOMY     PULMONARY THROMBECTOMY Bilateral 01/01/2022   Procedure: PULMONARY THROMBECTOMY;  Surgeon: Katha Cabal, MD;  Location: [place] [facility];  Service: Cardiovascular;  Laterality: Bilateral;   TONSILLECTOMY AND ADENOIDECTOMY     TOTAL KNEE ARTHROPLASTY Left 12/09/2021   Procedure: LEFT TOTAL KNEE ARTHROPLASTY;  Surgeon: Newt Minion, MD;  Location: [place];  Service: Orthopedics;  Laterality: Left;   Patient Active Problem List   Diagnosis Date Noted   Right groin pain 01/13/2022   Bilateral pulmonary embolism with right heart strain RV/LV ratio 1:5 ([place]) 12/31/2021   Acute respiratory failure with hypoxia ([place]) 12/31/2021   Total knee replacement status, left 12/09/21 12/09/2021   Unilateral primary osteoarthritis, left knee    Shoulder disorder 09/03/2021    Arthritis of knee 09/03/2021   Lumbar radiculopathy 09/03/2021   Abnormal MRI, knee 09/03/2021   Chronic pain of right knee 09/03/2021   Bronchitis 03/25/2021   Anemia 01/09/2020   HTN (hypertension) 12/31/2019   Chest pain 12/31/2019   DOE (dyspnea on exertion) 12/31/2019   Acute coronary syndrome ([place]) 12/31/2019   Unstable angina ([place]) 12/31/2019   Atherosclerosis 08/23/2018   Screening for tuberculosis 07/12/2018   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   Arthralgia 07/12/2018   Solitary pulmonary nodule 07/12/2018   Neck pain 02/15/2018   Dizziness 02/15/2018   Elevated TSH 02/15/2018   Screening for HIV (human immunodeficiency virus) 10/05/2017   Neuropathy 10/05/2017   Screening for colon cancer 10/05/2017   Hyperlipidemia 08/04/2016   Prediabetes 08/04/2016   Back pain 08/04/2016   Bilateral knee pain 06/18/2016   History of DVT of lower extremity 04/01/2014   History of DVT and PE (deep vein thrombosis) 05/04/2011    REFERRING DIAG: Z56.387 (ICD-10-CM) - Status post total left   THERAPY DIAG:  Chronic pain of left knee  Difficulty in walking, not elsewhere classified  Muscle weakness (generalized)  Stiffness of left knee, not elsewhere classified  Rationale for Evaluation and Treatment Rehabilitation  PERTINENT HISTORY:  S/P L TKE 12/09/2021. The DVT and PE prevented her from participating in much PT. Had  home health PT for 4 weeks. Pt was using SPC R side prior to surgery.  PRECAUTIONS: no known precautions  SUBJECTIVE: L knee pain but no number given. No falls. Cold weather affecting L knee mobility.   PAIN:  Are you having pain? See subjective     TODAY'S TREATMENT:   Therapeutic exercise Seated L knee AROM: 3 - 65 deg  NuStep, seat 9, arms 9, level 1 for 5 minutes to promote L knee flexion and movement.    Standing mini squats at TM bar: 1x8, 1x6. Chair behind pt for safety. VC's for breathing to prevent valsalva and decrease pain.  Gait 2x150' with RW  with frequent LUE heel strike for knee extension. Supervision,   Seated LLE LAQ with 3# AW: 3x10.   Seated LLE hamstring curl on medicine ball: x20.   Supine AROM post session:   2-65 degrees     Response to treatment Pt tolerated session well without aggravation of symptoms.     Clinical impression  Continuing PT POC with focus on knee AROM. Education provided on importance of knee mobility for return to gait mechanics. Pt remains very limited in knee extension beginning and end of session to ~65 degrees. Pt Encouraged to continue knee flexion exercises in HEP. Pt will continue to benefit from skilled PT services to progress knee pain, strength, and AROM deficits to optimize return to PLOF.      PATIENT EDUCATION: Education details: there-ex, HEP Person educated: Patient Education method: Explanation, Demonstration, Tactile cues, Verbal cues, and Handouts Education comprehension: verbalized understanding and returned demonstration   HOME EXERCISE PROGRAM: Access Code: [email or web address] URL: [email or web address] Date: 02/15/2022 Prepared by: Loralyn Freshwater  Exercises - Seated Heel Slide  - 1 x daily - 7 x weekly - 3 sets - 10 reps - 5 seconds hold       PT Short Term Goals - 02/11/22 1308       PT SHORT TERM GOAL #1   Title Pt will be independent with her initial HEP to improve L knee AROM, strength, and ability to ambulate with less difficulty.    Time 3    Period Weeks    Status New    Target Date 03/04/22              PT Long Term Goals - 02/11/22 1309       PT LONG TERM GOAL #1   Title Pt will be able to ambulate at least 500 ft without AD and no LOB to promote mobility.    Baseline Pt currenty ambulating with rw (02/11/2022)    Time 8    Period Weeks    Status New    Target Date 04/08/22      PT LONG TERM GOAL #2   Title Pt will improve her L knee extension AROM to 0 degrees and knee flexion AROM to at least 115 degrees to promote ability  to ambulate and negotiate stairs with less difficulty.    Baseline Seated L knee AROM: -15 degrees extension, 62 degrees flexion (02/11/2022)    Time 8    Period Weeks    Status New    Target Date 04/08/22      PT LONG TERM GOAL #3   Title Pt will improve L knee flexion and extension strength by at least 1/2 MMT to promote ability to ambulate with less difficulty.    Baseline knee flexion 3+/5, extension 4-/5 (02/11/2022)    Time 8    Period Weeks  Status New    Target Date 04/08/22      PT LONG TERM GOAL #4   Title Pt will improve her L knee FOTO score by at least 10 points as a demonstration of improved function.    Baseline L knee FOTO 36 (02/11/2022)    Time 8    Period Weeks    Status New    Target Date 04/08/22              Delphia Grates. Fairly IV, PT, DPT Physical Therapist- [place]  [facility]  03/04/2022, 5:10 PM

## 2022-03-05 ENCOUNTER — Other Ambulatory Visit: Payer: Self-pay | Admitting: Orthopedic Surgery

## 2022-03-05 ENCOUNTER — Other Ambulatory Visit: Payer: Self-pay

## 2022-03-05 ENCOUNTER — Ambulatory Visit: Payer: Self-pay | Attending: Cardiology | Admitting: Cardiology

## 2022-03-05 ENCOUNTER — Telehealth: Payer: Self-pay | Admitting: Family

## 2022-03-05 DIAGNOSIS — R7303 Prediabetes: Secondary | ICD-10-CM

## 2022-03-05 NOTE — Telephone Encounter (Signed)
Labs are ordered 

## 2022-03-05 NOTE — Telephone Encounter (Signed)
Pt called requesting a refill of pain medication. Please send to pharmacy on file. Pt phone number is [phone].

## 2022-03-05 NOTE — Telephone Encounter (Signed)
Pt s/p a total knee 12/09/2021 requesting refill of Oxycodone filled last month for qt #20

## 2022-03-06 ENCOUNTER — Other Ambulatory Visit: Payer: Self-pay | Admitting: Orthopedic Surgery

## 2022-03-06 MED ORDER — OXYCODONE-ACETAMINOPHEN 5-325 MG PO TABS
1.0000 | ORAL_TABLET | Freq: Three times a day (TID) | ORAL | 0 refills | Status: DC | PRN
  Filled 2022-03-06: qty 21, 7d supply, fill #0

## 2022-03-07 ENCOUNTER — Other Ambulatory Visit: Payer: Self-pay

## 2022-03-08 ENCOUNTER — Ambulatory Visit: Payer: No Typology Code available for payment source

## 2022-03-08 ENCOUNTER — Other Ambulatory Visit (INDEPENDENT_AMBULATORY_CARE_PROVIDER_SITE_OTHER): Payer: No Typology Code available for payment source

## 2022-03-08 ENCOUNTER — Other Ambulatory Visit: Payer: Self-pay

## 2022-03-08 DIAGNOSIS — R262 Difficulty in walking, not elsewhere classified: Secondary | ICD-10-CM

## 2022-03-08 DIAGNOSIS — M6281 Muscle weakness (generalized): Secondary | ICD-10-CM

## 2022-03-08 DIAGNOSIS — M25662 Stiffness of left knee, not elsewhere classified: Secondary | ICD-10-CM

## 2022-03-08 DIAGNOSIS — R7303 Prediabetes: Secondary | ICD-10-CM

## 2022-03-08 DIAGNOSIS — G8929 Other chronic pain: Secondary | ICD-10-CM

## 2022-03-08 LAB — CBC WITH DIFFERENTIAL/PLATELET
Basophils Absolute: 0 10*3/uL (ref 0.0–0.1)
Basophils Relative: 0.8 % (ref 0.0–3.0)
Eosinophils Absolute: 0 10*3/uL (ref 0.0–0.7)
Eosinophils Relative: 0.8 % (ref 0.0–5.0)
HCT: 39.2 % (ref 36.0–46.0)
Hemoglobin: 12.3 g/dL (ref 12.0–15.0)
Lymphocytes Relative: 46.8 % — ABNORMAL HIGH (ref 12.0–46.0)
Lymphs Abs: 2.9 10*3/uL (ref 0.7–4.0)
MCHC: 31.5 g/dL (ref 30.0–36.0)
MCV: 82.3 fl (ref 78.0–100.0)
Monocytes Absolute: 0.4 10*3/uL (ref 0.1–1.0)
Monocytes Relative: 7.2 % (ref 3.0–12.0)
Neutro Abs: 2.8 10*3/uL (ref 1.4–7.7)
Neutrophils Relative %: 44.4 % (ref 43.0–77.0)
Platelets: 225 10*3/uL (ref 150.0–400.0)
RBC: 4.77 Mil/uL (ref 3.87–5.11)
RDW: 15.7 % — ABNORMAL HIGH (ref 11.5–15.5)
WBC: 6.2 10*3/uL (ref 4.0–10.5)

## 2022-03-08 LAB — COMPREHENSIVE METABOLIC PANEL
ALT: 9 U/L (ref 0–35)
AST: 15 U/L (ref 0–37)
Albumin: 4 g/dL (ref 3.5–5.2)
Alkaline Phosphatase: 65 U/L (ref 39–117)
BUN: 12 mg/dL (ref 6–23)
CO2: 27 mEq/L (ref 19–32)
Calcium: 9.6 mg/dL (ref 8.4–10.5)
Chloride: 104 mEq/L (ref 96–112)
Creatinine, Ser: 0.93 mg/dL (ref 0.40–1.20)
GFR: 62.8 mL/min (ref >60.00)
Glucose, Bld: 83 mg/dL (ref 70–99)
Potassium: 4.5 mEq/L (ref 3.5–5.1)
Sodium: 138 mEq/L (ref 135–145)
Total Bilirubin: 0.3 mg/dL (ref 0.2–1.2)
Total Protein: 7.3 g/dL (ref 6.0–8.3)

## 2022-03-08 NOTE — Therapy (Signed)
OUTPATIENT PHYSICAL THERAPY TREATMENT NOTE   Patient Name: Leah Roberts MRN: [id number] DOB:[birth year], 71 y.o., female Today's Date: 03/08/2022  PCP: Burnard Hawthorne, FNP REFERRING PROVIDER: Newt Minion, MD   PT End of Session - 03/08/22 1715     Visit Number 8    Number of Visits 17    Date for PT Re-Evaluation 04/08/22    Authorization Type 8    Authorization Time Period of 10 progress report    PT Start Time 1716    PT Stop Time 1805    PT Time Calculation (min) 49 min    Activity Tolerance Patient tolerated treatment well    Behavior During Therapy WFL for tasks assessed/performed                  Past Medical History:  Diagnosis Date   Arthritis    DVT (deep venous thrombosis) ([place]) 2013   Dyspnea    due to knee pain   Family history of adverse reaction to anesthesia    Brother and sister hard to wake up.   GERD (gastroesophageal reflux disease)    History of blood transfusion    History of hypertension    Hypertension    Peripheral vascular disease ([place])    DVT   Pre-diabetes    Pulmonary embolism ([place]) 2013   Past Surgical History:  Procedure Laterality Date   APPENDECTOMY     PULMONARY THROMBECTOMY Bilateral 01/01/2022   Procedure: PULMONARY THROMBECTOMY;  Surgeon: Katha Cabal, MD;  Location: [facility];  Service: Cardiovascular;  Laterality: Bilateral;   TONSILLECTOMY AND ADENOIDECTOMY     TOTAL KNEE ARTHROPLASTY Left 12/09/2021   Procedure: LEFT TOTAL KNEE ARTHROPLASTY;  Surgeon: Newt Minion, MD;  Location: [facility];  Service: Orthopedics;  Laterality: Left;   Patient Active Problem List   Diagnosis Date Noted   Right groin pain 01/13/2022   Bilateral pulmonary embolism with right heart strain RV/LV ratio 1:5 ([place]) 12/31/2021   Acute respiratory failure with hypoxia ([place]) 12/31/2021   Total knee replacement status, left 12/09/21 12/09/2021   Unilateral primary osteoarthritis, left knee    Shoulder disorder  09/03/2021   Arthritis of knee 09/03/2021   Lumbar radiculopathy 09/03/2021   Abnormal MRI, knee 09/03/2021   Chronic pain of right knee 09/03/2021   Bronchitis 03/25/2021   Anemia 01/09/2020   HTN (hypertension) 12/31/2019   Chest pain 12/31/2019   DOE (dyspnea on exertion) 12/31/2019   Acute coronary syndrome ([place]) 12/31/2019   Unstable angina ([place]) 12/31/2019   Atherosclerosis 08/23/2018   Screening for tuberculosis 07/12/2018   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   Arthralgia 07/12/2018   Solitary pulmonary nodule 07/12/2018   Neck pain 02/15/2018   Dizziness 02/15/2018   Elevated TSH 02/15/2018   Screening for HIV (human immunodeficiency virus) 10/05/2017   Neuropathy 10/05/2017   Screening for colon cancer 10/05/2017   Hyperlipidemia 08/04/2016   Prediabetes 08/04/2016   Back pain 08/04/2016   Bilateral knee pain 06/18/2016   History of DVT of lower extremity 04/01/2014   History of DVT and PE (deep vein thrombosis) 05/04/2011    REFERRING DIAG: [id number] (ICD-10-CM) - Status post total left   THERAPY DIAG:  Chronic pain of left knee  Difficulty in walking, not elsewhere classified  Muscle weakness (generalized)  Stiffness of left knee, not elsewhere classified  Rationale for Evaluation and Treatment Rehabilitation  PERTINENT HISTORY:  S/P L TKE 12/09/2021. The DVT and PE prevented her from participating in much PT.  Had home health PT for 4 weeks. Pt was using SPC R side prior to surgery.  PRECAUTIONS: no known precautions  SUBJECTIVE: L knee is getting better. Some days are better than others. Not pain, just stiffness.  Was walking with her SPC short distances at home. Does not yet have another follow up appointment with surgeon.      PAIN:  Are you having pain? See subjective     TODAY'S TREATMENT:    Manual therapy   Seated STM L lateral knee to decrease fascial restrictions  Seated STM Rectus femoris to decrease tension with knee in flexion   Therapeutic  exercise  NuStep, seat 8, arms 8, level 1 for 5 minutes to promote L knee flexion and movement.   Seated L knee flexion AAROM 10x5 seconds for 3 sets (pt sitting on NuStep chair with L foot on 4 inch step)    Seated L knee AROM    73 degrees flexion (75 degrees AAROM)  Standing TKE with B UE assist   L 10x5 seconds, then 6x5 seconds   Forward step up onto 4 inch step with B UE assist, emphasis on knee flexion  L 10x   Improved exercise technique, movement at target joints, use of target muscles after min to mod verbal, visual, tactile cues.       Response to treatment Pt tolerated session well without aggravation of symptoms.  Pt states L knee feels better after treatment      Clinical impression  Worked on improving L  knee soft tissue mobility to improve flexion and extension ROM secondary to stiffness. Able to reach 73 degrees AROM L knee flexion (75 degrees AAROM). Improved L knee comfort level reported after manual therapy. Also worked on step ups to decrease difficulty with curb negotiation.  Pt tolerated session well without aggravation of symptoms. Pt will benefit from continued skilled physical therapy to improve ROM, strength and function.        PATIENT EDUCATION: Education details: there-ex, HEP Person educated: Patient Education method: Explanation, Demonstration, Tactile cues, Verbal cues, and Handouts Education comprehension: verbalized understanding and returned demonstration   HOME EXERCISE PROGRAM: Access Code: [email or web address] URL: [email or web address] Date: 02/15/2022 Prepared by: Joneen Boers  Exercises - Seated Heel Slide  - 1 x daily - 7 x weekly - 3 sets - 10 reps - 5 seconds hold       PT Short Term Goals - 02/11/22 1308       PT SHORT TERM GOAL #1   Title Pt will be independent with her initial HEP to improve L knee AROM, strength, and ability to ambulate with less difficulty.    Time 3    Period Weeks    Status New     Target Date 03/04/22              PT Long Term Goals - 02/11/22 1309       PT LONG TERM GOAL #1   Title Pt will be able to ambulate at least 500 ft without AD and no LOB to promote mobility.    Baseline Pt currenty ambulating with rw (02/11/2022)    Time 8    Period Weeks    Status New    Target Date 04/08/22      PT LONG TERM GOAL #2   Title Pt will improve her L knee extension AROM to 0 degrees and knee flexion AROM to at least 115 degrees to promote ability to ambulate and negotiate stairs with  less difficulty.    Baseline Seated L knee AROM: -15 degrees extension, 62 degrees flexion (02/11/2022)    Time 8    Period Weeks    Status New    Target Date 04/08/22      PT LONG TERM GOAL #3   Title Pt will improve L knee flexion and extension strength by at least 1/2 MMT to promote ability to ambulate with less difficulty.    Baseline knee flexion 3+/5, extension 4-/5 (02/11/2022)    Time 8    Period Weeks    Status New    Target Date 04/08/22      PT LONG TERM GOAL #4   Title Pt will improve her L knee FOTO score by at least 10 points as a demonstration of improved function.    Baseline L knee FOTO 36 (02/11/2022)    Time 8    Period Weeks    Status New    Target Date 04/08/22              Plan - 03/08/22 1738     Clinical Impression Statement Worked on improving L  knee soft tissue mobility to improve flexion and extension ROM secondary to stiffness. Able to reach 73 degrees AROM L knee flexion (75 degrees AAROM). Improved L knee comfort level reported after manual therapy. Also worked on step ups to decrease difficulty with curb negotiation.  Pt tolerated session well without aggravation of symptoms. Pt will benefit from continued skilled physical therapy to improve ROM, strength and function.    Personal Factors and Comorbidities Age;Comorbidity 3+;Fitness;Past/Current Experience;Time since onset of injury/illness/exacerbation    Comorbidities DVT, arthritis,  dyspnea, HTN, PE    Examination-Activity Limitations Bathing;Stairs;Dressing;Stand;Bend;Lift;Transfers;Carry;Squat;Locomotion Level    Stability/Clinical Decision Making Stable/Uncomplicated    Clinical Decision Making Low    Rehab Potential Fair    PT Frequency 2x / week    PT Duration 8 weeks    PT Treatment/Interventions Therapeutic activities;Therapeutic exercise;Functional mobility training;Balance training;Neuromuscular re-education;Patient/family education;Manual techniques;Dry needling;Aquatic Therapy;Electrical Stimulation;Iontophoresis 4mg /ml Dexamethasone;Gait training;Stair training    PT Next Visit Plan Knee A/AROM, hip and knee strengthening, gait training, manual techniques, modalities PRN    Consulted and Agree with Plan of Care Patient                    Joneen Boers PT, DPT  03/08/2022, 6:14 PM

## 2022-03-09 ENCOUNTER — Encounter: Payer: Self-pay | Admitting: Cardiology

## 2022-03-09 ENCOUNTER — Other Ambulatory Visit: Payer: Self-pay | Admitting: Family

## 2022-03-09 ENCOUNTER — Ambulatory Visit: Payer: No Typology Code available for payment source | Attending: Cardiology | Admitting: Cardiology

## 2022-03-09 ENCOUNTER — Other Ambulatory Visit: Payer: Self-pay

## 2022-03-09 VITALS — BP 134/87 | HR 72 | Ht 66.0 in | Wt 225.8 lb

## 2022-03-09 DIAGNOSIS — E7800 Pure hypercholesterolemia, unspecified: Secondary | ICD-10-CM

## 2022-03-09 DIAGNOSIS — I2699 Other pulmonary embolism without acute cor pulmonale: Secondary | ICD-10-CM

## 2022-03-09 DIAGNOSIS — R899 Unspecified abnormal finding in specimens from other organs, systems and tissues: Secondary | ICD-10-CM

## 2022-03-09 DIAGNOSIS — I700 Atherosclerosis of aorta: Secondary | ICD-10-CM

## 2022-03-09 NOTE — Patient Instructions (Addendum)
Call to schedule an appointment with Dr. Grayland Ormond:  [address] [place], [place] [address] [phone]    Follow-Up: At [facility], you and your health needs are our priority.  As part of our continuing mission to provide you with exceptional heart care, we have created designated Provider Care Teams.  These Care Teams include your primary Cardiologist (physician) and Advanced Practice Providers (APPs -  Physician Assistants and Nurse Practitioners) who all work together to provide you with the care you need, when you need it.  We recommend signing up for the patient portal called "MyChart".  Sign up information is provided on this After Visit Summary.  MyChart is used to connect with patients for Virtual Visits (Telemedicine).  Patients are able to view lab/test results, encounter notes, upcoming appointments, etc.  Non-urgent messages can be sent to your provider as well.   To learn more about what you can do with MyChart, go to [email or web address].    Your next appointment:    Follow up as needed  The format for your next appointment:   In Person  Provider:   You may see Kate Sable, MD or one of the following Advanced Practice Providers on your designated Care Team:   Murray Hodgkins, NP Christell Faith, PA-C Cadence Kathlen Mody, PA-C Gerrie Nordmann, NP    Other Instructions   Important Information About Sugar

## 2022-03-09 NOTE — Progress Notes (Signed)
Cardiology Office Note:    Date:  03/09/2022   ID:  Leah Roberts, DOB January 20, 1951, MRN [id number]  PCP:  Allegra Grana, FNP   [facility] Providers Cardiologist:  None     Referring MD: Allegra Grana, FNP   Chief Complaint  Patient presents with   New Patient (Initial Visit)    Atherosclerosis PCP referral, Cardiac Hx    History of Present Illness:    Leah Roberts is a 71 y.o. female with a hx of hyperlipidemia, DVT 2013, PE (2014, 2023 s/p thrombectomy 01/01/2022) on Eliquis who presents due to aortic atherosclerosis.  She had a heart left total knee replacement 12/09/2021.  Was seen in the ED about 2 to 3 weeks later with symptoms of shortness of breath.  Per EMR notes, patient missed some doses of Eliquis.  Chest CT obtained 12/31/2021 showed bilateral PEs, RV strain, aortic arch atherosclerosis.  Patient subsequently underwent thrombectomy by vascular surgery.    Echocardiogram 01/01/2022 showed EF 55 to 60%, RV function normal. Lexiscan Myoview 01/02/2020 showed no ischemia, low risk study  Last LDL 118, total cholesterol controlled.  Started on Crestor 5 mg daily by PCP.  Planning on following up.  Denies chest pain or shortness of breath.  Has few much better since leaving hospital.  Previously evaluated by hematology, lifelong Eliquis advised.  Previous coagulation work-up unrevealing.  Past Medical History:  Diagnosis Date   Arthritis    DVT (deep venous thrombosis) ([facility]) 2013   Dyspnea    due to knee pain   Family history of adverse reaction to anesthesia    Brother and sister hard to wake up.   GERD (gastroesophageal reflux disease)    History of blood transfusion    History of hypertension    Hypertension    Peripheral vascular disease ([facility])    DVT   Pre-diabetes    Pulmonary embolism ([facility]) 2013    Past Surgical History:  Procedure Laterality Date   APPENDECTOMY     PULMONARY THROMBECTOMY Bilateral 01/01/2022   Procedure: PULMONARY  THROMBECTOMY;  Surgeon: Renford Dills, MD;  Location: [facility];  Service: Cardiovascular;  Laterality: Bilateral;   TONSILLECTOMY AND ADENOIDECTOMY     TOTAL KNEE ARTHROPLASTY Left 12/09/2021   Procedure: LEFT TOTAL KNEE ARTHROPLASTY;  Surgeon: Nadara Mustard, MD;  Location: [facility];  Service: Orthopedics;  Laterality: Left;    Current Medications: Current Meds  Medication Sig   apixaban (ELIQUIS) 5 MG TABS tablet Take 1 tablet (5 mg total) by mouth 2 (two) times daily.   bacitracin 500 UNIT/GM ointment Apply 1 Application topically 2 (two) times daily.   calcium carbonate (TUMS - DOSED IN MG ELEMENTAL CALCIUM) 500 MG chewable tablet Chew 1 tablet by mouth daily as needed for indigestion or heartburn.   ferrous sulfate 325 (65 FE) MG tablet Take 1 tablet (325 mg total) by mouth 2 (two) times daily with a meal.   lidocaine (LIDODERM) 5 % Place 1 patch onto the skin every 12 (twelve) hours. Remove & Discard patch within 12 hours or as directed by MD   loratadine (CLARITIN) 10 MG tablet Take 10 mg by mouth daily.   Menthol, Topical Analgesic, (BIOFREEZE EX) Apply 1 Application topically daily as needed (leg pain).   methocarbamol (ROBAXIN) 500 MG tablet Take 1 tablet (500 mg total) by mouth every 8 (eight) hours as needed for muscle spasms.   Multiple Vitamin (MULTIVITAMIN WITH MINERALS) TABS tablet Take 1 tablet by mouth  2 (two) times a week.   oxyCODONE-acetaminophen (PERCOCET/ROXICET) 5-325 MG tablet Take 1 tablet by mouth every 8 (eight) hours as needed for severe pain.   polyethylene glycol (MIRALAX / GLYCOLAX) 17 g packet Take 17 g by mouth daily.   rosuvastatin (CRESTOR) 5 MG tablet Take 1 tablet (5 mg total) by mouth at bedtime.   senna-docusate (SENOKOT-S) 8.6-50 MG tablet Take 1 tablet by mouth at bedtime as needed for moderate constipation.     Allergies:   Sulfa antibiotics   Social History   Socioeconomic History   Marital status: Widowed    Spouse name: Not on  file   Number of children: Not on file   Years of education: Not on file   Highest education level: Not on file  Occupational History   Not on file  Tobacco Use   Smoking status: Never   Smokeless tobacco: Never  Substance and Sexual Activity   Alcohol use: Yes    Comment: Ocassional glass of wine   Drug use: No   Sexual activity: Not on file  Other Topics Concern   Not on file  Social History Narrative   ** Merged History Encounter **       Social Determinants of Health   Financial Resource Strain: Not on file  Food Insecurity: Not on file  Transportation Needs: Not on file  Physical Activity: Not on file  Stress: Not on file  Social Connections: Not on file     Family History: The patient's family history includes Allergies in her sister; Heart disease (age of onset: 4) in her father; Hypertension in her father; Thyroid disease in her sister. There is no history of CVA, Heart attack, High Cholesterol, or Colon cancer.  ROS:   Please see the history of present illness.     All other systems reviewed and are negative.  EKGs/Labs/Other Studies Reviewed:    The following studies were reviewed today:   EKG:  EKG is  ordered today.  The ekg ordered today demonstrates normal sinus rhythm  Recent Labs: 12/30/2021: B Natriuretic Peptide 146.2 01/04/2022: Magnesium 2.0 03/08/2022: ALT 9; BUN 12; Creatinine, Ser 0.93; Hemoglobin 12.3; Platelets 225.0; Potassium 4.5; Sodium 138  Recent Lipid Panel    Component Value Date/Time   CHOL 181 01/13/2022 1216   TRIG 130.0 01/13/2022 1216   HDL 44.60 01/13/2022 1216   CHOLHDL 4 01/13/2022 1216   VLDL 26.0 01/13/2022 1216   LDLCALC 110 (H) 01/13/2022 1216     Risk Assessment/Calculations:             Physical Exam:    VS:  BP 134/87 (BP Location: Left Arm)   Pulse 72   Ht 5\' 6"  (1.676 m)   Wt 225 lb 12.8 oz (102.4 kg)   SpO2 98%   BMI 36.45 kg/m     Wt Readings from Last 3 Encounters:  03/09/22 225 lb 12.8 oz  (102.4 kg)  01/13/22 224 lb (101.6 kg)  12/30/21 199 lb 15.3 oz (90.7 kg)     GEN:  Well nourished, well developed in no acute distress HEENT: Normal NECK: No JVD; No carotid bruits CARDIAC: RRR, no murmurs, rubs, gallops RESPIRATORY:  Clear to auscultation without rales, wheezing or rhonchi  ABDOMEN: Soft, non-tender, non-distended MUSCULOSKELETAL:  No edema; No deformity  SKIN: Warm and dry NEUROLOGIC:  Alert and oriented x 3 PSYCHIATRIC:  Normal affect   ASSESSMENT:    1. Aortic atherosclerosis ([place])   2. Pure hypercholesterolemia   3.  Bilateral pulmonary embolism with right heart strain RV/LV ratio 1:5 ([facility])    PLAN:    In order of problems listed above:  Aortic atherosclerosis, minimal LAD calcification, echo shows normal LV EF, normal RV function.  Denies chest pain.  Continue Eliquis, Crestor. Hyperlipidemia, continue Crestor.  Follow-up lipid panel with PCP, titrate Crestor if not adequately controlled. History of DVT, PE on Eliquis.  Management as per vascular service and hematology, lifelong Eliquis advised per hematology.     Follow-up as needed  Medication Adjustments/Labs and Tests Ordered: Current medicines are reviewed at length with the patient today.  Concerns regarding medicines are outlined above.  Orders Placed This Encounter  Procedures   EKG 12-Lead   No orders of the defined types were placed in this encounter.   Patient Instructions  Call to schedule an appointment with Dr. Orlie Dakin:  [address] [place], [place] [address] [phone]    Follow-Up: At [facility], you and your health needs are our priority.  As part of our continuing mission to provide you with exceptional heart care, we have created designated Provider Care Teams.  These Care Teams include your primary Cardiologist (physician) and Advanced Practice Providers (APPs -  Physician Assistants and Nurse Practitioners) who all work together to provide you  with the care you need, when you need it.  We recommend signing up for the patient portal called "MyChart".  Sign up information is provided on this After Visit Summary.  MyChart is used to connect with patients for Virtual Visits (Telemedicine).  Patients are able to view lab/test results, encounter notes, upcoming appointments, etc.  Non-urgent messages can be sent to your provider as well.   To learn more about what you can do with MyChart, go to [email or web address].    Your next appointment:    Follow up as needed  The format for your next appointment:   In Person  Provider:   You may see Debbe Odea, MD or one of the following Advanced Practice Providers on your designated Care Team:   Nicolasa Ducking, NP Eula Listen, PA-C Cadence Fransico Michael, PA-C Charlsie Quest, NP    Other Instructions   Important Information About Sugar         Signed, Debbe Odea, MD  03/09/2022 11:13 AM    [facility]

## 2022-03-10 ENCOUNTER — Ambulatory Visit: Payer: No Typology Code available for payment source

## 2022-03-10 DIAGNOSIS — M25662 Stiffness of left knee, not elsewhere classified: Secondary | ICD-10-CM

## 2022-03-10 DIAGNOSIS — M6281 Muscle weakness (generalized): Secondary | ICD-10-CM

## 2022-03-10 DIAGNOSIS — R262 Difficulty in walking, not elsewhere classified: Secondary | ICD-10-CM

## 2022-03-10 DIAGNOSIS — G8929 Other chronic pain: Secondary | ICD-10-CM

## 2022-03-10 NOTE — Therapy (Signed)
OUTPATIENT PHYSICAL THERAPY TREATMENT NOTE   Patient Name: Leah Roberts MRN: [id number] DOB:25-Sep-1950, 71 y.o., female Today's Date: 03/10/2022  PCP: Allegra Grana, FNP REFERRING PROVIDER: Nadara Mustard, MD   PT End of Session - 03/10/22 1634     Visit Number 9    Number of Visits 17    Date for PT Re-Evaluation 04/08/22    Authorization Type 9    Authorization Time Period of 10 progress report    PT Start Time 1634    PT Stop Time 1715    PT Time Calculation (min) 41 min    Activity Tolerance Patient tolerated treatment well    Behavior During Therapy WFL for tasks assessed/performed                   Past Medical History:  Diagnosis Date   Arthritis    DVT (deep venous thrombosis) (HCC) 2013   Dyspnea    due to knee pain   Family history of adverse reaction to anesthesia    Brother and sister hard to wake up.   GERD (gastroesophageal reflux disease)    History of blood transfusion    History of hypertension    Hypertension    Peripheral vascular disease (HCC)    DVT   Pre-diabetes    Pulmonary embolism (HCC) 2013   Past Surgical History:  Procedure Laterality Date   APPENDECTOMY     PULMONARY THROMBECTOMY Bilateral 01/01/2022   Procedure: PULMONARY THROMBECTOMY;  Surgeon: Renford Dills, MD;  Location: [facility];  Service: Cardiovascular;  Laterality: Bilateral;   TONSILLECTOMY AND ADENOIDECTOMY     TOTAL KNEE ARTHROPLASTY Left 12/09/2021   Procedure: LEFT TOTAL KNEE ARTHROPLASTY;  Surgeon: Nadara Mustard, MD;  Location: [facility] OR;  Service: Orthopedics;  Laterality: Left;   Patient Active Problem List   Diagnosis Date Noted   Right groin pain 01/13/2022   Bilateral pulmonary embolism with right heart strain RV/LV ratio 1:5 (HCC) 12/31/2021   Acute respiratory failure with hypoxia (HCC) 12/31/2021   Total knee replacement status, left 12/09/21 12/09/2021   Unilateral primary osteoarthritis, left knee    Shoulder disorder  09/03/2021   Arthritis of knee 09/03/2021   Lumbar radiculopathy 09/03/2021   Abnormal MRI, knee 09/03/2021   Chronic pain of right knee 09/03/2021   Bronchitis 03/25/2021   Anemia 01/09/2020   HTN (hypertension) 12/31/2019   Chest pain 12/31/2019   DOE (dyspnea on exertion) 12/31/2019   Acute coronary syndrome (HCC) 12/31/2019   Unstable angina (HCC) 12/31/2019   Atherosclerosis 08/23/2018   Screening for tuberculosis 07/12/2018   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   Arthralgia 07/12/2018   Solitary pulmonary nodule 07/12/2018   Neck pain 02/15/2018   Dizziness 02/15/2018   Elevated TSH 02/15/2018   Screening for HIV (human immunodeficiency virus) 10/05/2017   Neuropathy 10/05/2017   Screening for colon cancer 10/05/2017   Hyperlipidemia 08/04/2016   Prediabetes 08/04/2016   Back pain 08/04/2016   Bilateral knee pain 06/18/2016   History of DVT of lower extremity 04/01/2014   History of DVT and PE (deep vein thrombosis) 05/04/2011    REFERRING DIAG: G31.517 (ICD-10-CM) - Status post total left   THERAPY DIAG:  Chronic pain of left knee  Difficulty in walking, not elsewhere classified  Stiffness of left knee, not elsewhere classified  Muscle weakness (generalized)  Rationale for Evaluation and Treatment Rehabilitation  PERTINENT HISTORY:  S/P L TKE 12/09/2021. The DVT and PE prevented her from participating in much  PT. Had home health PT for 4 weeks. Pt was using SPC R side prior to surgery. Heart doctor says her heart is fine (had an appointment yesterday).  PRECAUTIONS: no known precautions  SUBJECTIVE: No L knee pain, just the stiffness.      PAIN:  Are you having pain? See subjective     TODAY'S TREATMENT:    Manual therapy   Seated STM Rectus femoris to decrease tension with knee in flexion   Seated STM L lateral knee to decrease fascial restrictions  Seated L knee flexion AROM improved 75 degrees afterwards.    Therapeutic exercise  Seated L knee  AROM  Extension -12 degrees  Flexion 70 degrees  After manual therapy   Seated L knee flexion PROM with PT 10x3  (76 degrees seated L knee flexion AROM afterwards)   Seated L knee flexion AAROM with PT 10x2, then 5x   Increased time to allow for L knee comfort level to recover    Seated L knee AROM afterwards 76 degrees (83 degrees AAROM)  Gait with rw with emphasis on knee flexion during swing phase    Improved exercise technique, movement at target joints, use of target muscles after min to mod verbal, visual, tactile cues.       Response to treatment Pt tolerated session well without aggravation of symptoms.       Clinical impression   Very stiff L knee joint. Continued working on decreasing L knee soft tissue restrictions as well as improving rectus femoris flexibility and PROM/AAROM to improve joint capsule mobility to improve L knee flexion AROM. Pt able to achieve 76 degrees seated L knee flexion AROM and 83 degrees AAROM after session. Worked on incorporating L knee flexion ROM gains during L LE swing phase of gait to help maintain flexibility.  Pt will benefit from continued skilled physical therapy to improve ROM, strength and function.        PATIENT EDUCATION: Education details: there-ex, HEP Person educated: Patient Education method: Explanation, Demonstration, Tactile cues, Verbal cues, and Handouts Education comprehension: verbalized understanding and returned demonstration   HOME EXERCISE PROGRAM: Access Code: [email or web address] URL: [email or web address] Date: 02/15/2022 Prepared by: Loralyn Freshwater  Exercises - Seated Heel Slide  - 1 x daily - 7 x weekly - 3 sets - 10 reps - 5 seconds hold       PT Short Term Goals - 02/11/22 1308       PT SHORT TERM GOAL #1   Title Pt will be independent with her initial HEP to improve L knee AROM, strength, and ability to ambulate with less difficulty.    Time 3    Period Weeks    Status New     Target Date 03/04/22              PT Long Term Goals - 02/11/22 1309       PT LONG TERM GOAL #1   Title Pt will be able to ambulate at least 500 ft without AD and no LOB to promote mobility.    Baseline Pt currenty ambulating with rw (02/11/2022)    Time 8    Period Weeks    Status New    Target Date 04/08/22      PT LONG TERM GOAL #2   Title Pt will improve her L knee extension AROM to 0 degrees and knee flexion AROM to at least 115 degrees to promote ability to ambulate and negotiate stairs with less difficulty.  Baseline Seated L knee AROM: -15 degrees extension, 62 degrees flexion (02/11/2022)    Time 8    Period Weeks    Status New    Target Date 04/08/22      PT LONG TERM GOAL #3   Title Pt will improve L knee flexion and extension strength by at least 1/2 MMT to promote ability to ambulate with less difficulty.    Baseline knee flexion 3+/5, extension 4-/5 (02/11/2022)    Time 8    Period Weeks    Status New    Target Date 04/08/22      PT LONG TERM GOAL #4   Title Pt will improve her L knee FOTO score by at least 10 points as a demonstration of improved function.    Baseline L knee FOTO 36 (02/11/2022)    Time 8    Period Weeks    Status New    Target Date 04/08/22              Plan - 03/10/22 1747     Clinical Impression Statement Very stiff L knee joint. Continued working on decreasing L knee soft tissue restrictions as well as improving rectus femoris flexibility and PROM/AAROM to improve joint capsule mobility to improve L knee flexion AROM. Pt able to achieve 76 degrees seated L knee flexion AROM and 83 degrees AAROM after session. Worked on incorporating L knee flexion ROM gains during L LE swing phase of gait to help maintain flexibility.  Pt will benefit from continued skilled physical therapy to improve ROM, strength and function.    Personal Factors and Comorbidities Age;Comorbidity 3+;Fitness;Past/Current Experience;Time since onset of  injury/illness/exacerbation    Comorbidities DVT, arthritis, dyspnea, HTN, PE    Examination-Activity Limitations Bathing;Stairs;Dressing;Stand;Bend;Lift;Transfers;Carry;Squat;Locomotion Level    Stability/Clinical Decision Making Stable/Uncomplicated    Rehab Potential Fair    PT Frequency 2x / week    PT Duration 8 weeks    PT Treatment/Interventions Therapeutic activities;Therapeutic exercise;Functional mobility training;Balance training;Neuromuscular re-education;Patient/family education;Manual techniques;Dry needling;Aquatic Therapy;Electrical Stimulation;Iontophoresis 4mg /ml Dexamethasone;Gait training;Stair training    PT Next Visit Plan Knee A/AROM, hip and knee strengthening, gait training, manual techniques, modalities PRN    Consulted and Agree with Plan of Care Patient                     PT, DPT  03/10/2022, 5:49 PM

## 2022-03-17 ENCOUNTER — Ambulatory Visit: Payer: No Typology Code available for payment source

## 2022-03-17 DIAGNOSIS — M25662 Stiffness of left knee, not elsewhere classified: Secondary | ICD-10-CM

## 2022-03-17 DIAGNOSIS — G8929 Other chronic pain: Secondary | ICD-10-CM

## 2022-03-17 DIAGNOSIS — M6281 Muscle weakness (generalized): Secondary | ICD-10-CM

## 2022-03-17 DIAGNOSIS — R262 Difficulty in walking, not elsewhere classified: Secondary | ICD-10-CM

## 2022-03-17 NOTE — Therapy (Signed)
OUTPATIENT PHYSICAL THERAPY TREATMENT NOTE   Patient Name: Leah Roberts MRN: [id number] DOB:19-Jun-1950, 71 y.o., female Today's Date: 03/17/2022  PCP: Burnard Hawthorne, FNP REFERRING PROVIDER: Newt Minion, MD   PT End of Session - 03/17/22 1718     Visit Number 10    Number of Visits 17    Date for PT Re-Evaluation 04/08/22    Authorization Type 10    Authorization Time Period of 10 progress report    PT Start Time 1718    PT Stop Time 1801    PT Time Calculation (min) 43 min    Activity Tolerance Patient tolerated treatment well    Behavior During Therapy WFL for tasks assessed/performed                    Past Medical History:  Diagnosis Date   Arthritis    DVT (deep venous thrombosis) ([place]) 2013   Dyspnea    due to knee pain   Family history of adverse reaction to anesthesia    Brother and sister hard to wake up.   GERD (gastroesophageal reflux disease)    History of blood transfusion    History of hypertension    Hypertension    Peripheral vascular disease ([place])    DVT   Pre-diabetes    Pulmonary embolism ([place]) 2013   Past Surgical History:  Procedure Laterality Date   APPENDECTOMY     PULMONARY THROMBECTOMY Bilateral 01/01/2022   Procedure: PULMONARY THROMBECTOMY;  Surgeon: Katha Cabal, MD;  Location: [place] [facility];  Service: Cardiovascular;  Laterality: Bilateral;   TONSILLECTOMY AND ADENOIDECTOMY     TOTAL KNEE ARTHROPLASTY Left 12/09/2021   Procedure: LEFT TOTAL KNEE ARTHROPLASTY;  Surgeon: Newt Minion, MD;  Location: [place];  Service: Orthopedics;  Laterality: Left;   Patient Active Problem List   Diagnosis Date Noted   Right groin pain 01/13/2022   Bilateral pulmonary embolism with right heart strain RV/LV ratio 1:5 ([place]) 12/31/2021   Acute respiratory failure with hypoxia ([place]) 12/31/2021   Total knee replacement status, left 12/09/21 12/09/2021   Unilateral primary osteoarthritis, left knee    Shoulder disorder  09/03/2021   Arthritis of knee 09/03/2021   Lumbar radiculopathy 09/03/2021   Abnormal MRI, knee 09/03/2021   Chronic pain of right knee 09/03/2021   Bronchitis 03/25/2021   Anemia 01/09/2020   HTN (hypertension) 12/31/2019   Chest pain 12/31/2019   DOE (dyspnea on exertion) 12/31/2019   Acute coronary syndrome ([place]) 12/31/2019   Unstable angina ([place]) 12/31/2019   Atherosclerosis 08/23/2018   Screening for tuberculosis 07/12/2018   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   Arthralgia 07/12/2018   Solitary pulmonary nodule 07/12/2018   Neck pain 02/15/2018   Dizziness 02/15/2018   Elevated TSH 02/15/2018   Screening for HIV (human immunodeficiency virus) 10/05/2017   Neuropathy 10/05/2017   Screening for colon cancer 10/05/2017   Hyperlipidemia 08/04/2016   Prediabetes 08/04/2016   Back pain 08/04/2016   Bilateral knee pain 06/18/2016   History of DVT of lower extremity 04/01/2014   History of DVT and PE (deep vein thrombosis) 05/04/2011    REFERRING DIAG: I71.245 (ICD-10-CM) - Status post total left   THERAPY DIAG:  Chronic pain of left knee  Difficulty in walking, not elsewhere classified  Stiffness of left knee, not elsewhere classified  Muscle weakness (generalized)  Rationale for Evaluation and Treatment Rehabilitation  PERTINENT HISTORY:  S/P L TKE 12/09/2021. The DVT and PE prevented her from participating in  much PT. Had home health PT for 4 weeks. Pt was using SPC R side prior to surgery. Heart doctor says her heart is fine (had an appointment yesterday).  PRECAUTIONS: no known precautions  SUBJECTIVE: 6/10 L knee pain currently from the cold.  Feels like the progress is there in general.      PAIN:  Are you having pain? See subjective     TODAY'S TREATMENT:    Manual therapy   Seated STM Rectus femoris to decrease tension with knee in flexion   And proximal portion  Seated STM L lateral knee to decrease fascial restrictions    Therapeutic exercise  Gait  with SPC 40 ft x 2 CGA  Seated L knee AROM  Extension -12 degrees  Flexion 75 degrees  Seated manually resisted L knee flexion and extension 1x each way  Reviewed progress with PT towards goals    Seated L knee flexion   Yellow band 10x   After manual therapy   Seated L knee flexion PROM with PT 10x3  (76 degrees seated L knee flexion AROM afterwards)   Seated L knee flexion AAROM with PT 10x, then 5x   Increased time to allow for L knee comfort level to recover  Seated L knee AROM afterwards 78 degrees    Improved exercise technique, movement at target joints, use of target muscles after min to mod verbal, visual, tactile cues.       Response to treatment Pt tolerated session well without aggravation of symptoms.       Clinical impression   Pt able to ambulate 40 ft today with SPC on R side prior to sitting rest breaks, decreased gait speed, no loss of balance. Pt also demonstrates stiff L knee joint with rectus femoris muscle tension limiting knee flexion ROM. Slight improved L knee extension AROM by 3 degrees and improved L knee flexion AROM by 16 degrees since initial evaluation. Continued working on decreasing L knee soft tissue restrictions as well as improving rectus femoris flexibility and PROM/AAROM to improve joint capsule mobility to improve L knee flexion AROM. Pt able to achieve 78 degrees seated L knee flexion AROM after session. Pt will benefit from continued skilled physical therapy to improve ROM, strength and function.        PATIENT EDUCATION: Education details: there-ex, HEP Person educated: Patient Education method: Explanation, Demonstration, Tactile cues, Verbal cues, and Handouts Education comprehension: verbalized understanding and returned demonstration   HOME EXERCISE PROGRAM: Access Code: [email or web address] URL: [email or web address] Date: 02/15/2022 Prepared by: Joneen Boers  Exercises - Seated Heel Slide  - 1 x daily - 7 x  weekly - 3 sets - 10 reps - 5 seconds hold       PT Short Term Goals - 03/17/22 1720       PT SHORT TERM GOAL #1   Title Pt will be independent with her initial HEP to improve L knee AROM, strength, and ability to ambulate with less difficulty.    Baseline Doing her HEP.  Does not appear to have questions with the exercises (03/17/2022)    Time 3    Period Weeks    Status Achieved    Target Date 03/04/22              PT Long Term Goals - 03/17/22 1722       PT LONG TERM GOAL #1   Title Pt will be able to ambulate at least 500 ft without AD and no LOB to promote  mobility.    Baseline Pt currenty ambulating with rw (02/11/2022); Able to ambulate 40 ft wiht SPC on R side prior to sitting rest break (03/17/2022)    Time 8    Period Weeks    Status On-going    Target Date 04/08/22      PT LONG TERM GOAL #2   Title Pt will improve her L knee extension AROM to 0 degrees and knee flexion AROM to at least 115 degrees to promote ability to ambulate and negotiate stairs with less difficulty.    Baseline Seated L knee AROM: -15 degrees extension, 62 degrees flexion (02/11/2022); -12 degrees extension, 75 degrees flexion (03/17/2022)    Time 8    Period Weeks    Status On-going    Target Date 04/08/22      PT LONG TERM GOAL #3   Title Pt will improve L knee flexion and extension strength by at least 1/2 MMT to promote ability to ambulate with less difficulty.    Baseline knee flexion 3+/5, extension 4-/5 (02/11/2022); knee flexion 3+/5, extension 5/5 (03/17/2022)    Time 8    Period Weeks    Status Partially Met    Target Date 04/08/22      PT LONG TERM GOAL #4   Title Pt will improve her L knee FOTO score by at least 10 points as a demonstration of improved function.    Baseline L knee FOTO 36 (02/11/2022), (03/17/2022)    Time 8    Period Weeks    Status On-going    Target Date 04/08/22              Plan - 03/17/22 1717     Clinical Impression Statement Pt able  to ambulate 40 ft today with SPC on R side prior to sitting rest breaks, decreased gait speed, no loss of balance. Pt also demonstrates stiff L knee joint with rectus femoris muscle tension limiting knee flexion ROM. Slight improved L knee extension AROM by 3 degrees and improved L knee flexion AROM by 16 degrees since initial evaluation. Continued working on decreasing L knee soft tissue restrictions as well as improving rectus femoris flexibility and PROM/AAROM to improve joint capsule mobility to improve L knee flexion AROM. Pt able to achieve 78 degrees seated L knee flexion AROM after session. Pt will benefit from continued skilled physical therapy to improve ROM, strength and function.    Personal Factors and Comorbidities Age;Comorbidity 3+;Fitness;Past/Current Experience;Time since onset of injury/illness/exacerbation    Comorbidities DVT, arthritis, dyspnea, HTN, PE    Examination-Activity Limitations Bathing;Stairs;Dressing;Stand;Bend;Lift;Transfers;Carry;Squat;Locomotion Level    Stability/Clinical Decision Making Stable/Uncomplicated    Clinical Decision Making Low    Rehab Potential Fair    PT Frequency 2x / week    PT Duration 8 weeks    PT Treatment/Interventions Therapeutic activities;Therapeutic exercise;Functional mobility training;Balance training;Neuromuscular re-education;Patient/family education;Manual techniques;Dry needling;Aquatic Therapy;Electrical Stimulation;Iontophoresis 46m/ml Dexamethasone;Gait training;Stair training    PT Next Visit Plan Knee A/AROM, hip and knee strengthening, gait training, manual techniques, modalities PRN    Consulted and Agree with Plan of Care Patient                      MJoneen BoersPT, DPT  03/17/2022, 6:14 PM

## 2022-03-18 ENCOUNTER — Telehealth: Payer: Self-pay | Admitting: Family

## 2022-03-18 NOTE — Telephone Encounter (Signed)
S/p left total knee 12/2021 requesting refill on pain medication

## 2022-03-18 NOTE — Telephone Encounter (Signed)
Patient wants refill on oxycodone sent to the pharmacy.  Patient best [phone]

## 2022-03-19 ENCOUNTER — Other Ambulatory Visit: Payer: Self-pay

## 2022-03-19 MED ORDER — OXYCODONE-ACETAMINOPHEN 5-325 MG PO TABS
1.0000 | ORAL_TABLET | Freq: Two times a day (BID) | ORAL | 0 refills | Status: DC | PRN
  Filled 2022-03-19: qty 14, 7d supply, fill #0

## 2022-03-23 ENCOUNTER — Telehealth: Payer: Self-pay

## 2022-03-23 ENCOUNTER — Ambulatory Visit: Payer: No Typology Code available for payment source

## 2022-03-23 NOTE — Telephone Encounter (Signed)
No show. Called patient but unable to leave message secondary to mailbox being full.

## 2022-03-24 ENCOUNTER — Ambulatory Visit: Payer: No Typology Code available for payment source

## 2022-03-24 DIAGNOSIS — M25662 Stiffness of left knee, not elsewhere classified: Secondary | ICD-10-CM

## 2022-03-24 DIAGNOSIS — M6281 Muscle weakness (generalized): Secondary | ICD-10-CM

## 2022-03-24 DIAGNOSIS — G8929 Other chronic pain: Secondary | ICD-10-CM

## 2022-03-24 DIAGNOSIS — R262 Difficulty in walking, not elsewhere classified: Secondary | ICD-10-CM

## 2022-03-24 NOTE — Therapy (Signed)
OUTPATIENT PHYSICAL THERAPY TREATMENT NOTE   Patient Name: Leah Roberts MRN: [id number] DOB:05-13-1950, 71 y.o., female Today's Date: 03/24/2022  PCP: Burnard Hawthorne, FNP REFERRING PROVIDER: Newt Minion, MD   PT End of Session - 03/24/22 1721     Visit Number 11    Number of Visits 17    Date for PT Re-Evaluation 04/08/22    Authorization Type 1    Authorization Time Period of 10 progress report    PT Start Time 1721    PT Stop Time 1808    PT Time Calculation (min) 47 min    Activity Tolerance Patient tolerated treatment well    Behavior During Therapy WFL for tasks assessed/performed                     Past Medical History:  Diagnosis Date   Arthritis    DVT (deep venous thrombosis) ([place]) 2013   Dyspnea    due to knee pain   Family history of adverse reaction to anesthesia    Brother and sister hard to wake up.   GERD (gastroesophageal reflux disease)    History of blood transfusion    History of hypertension    Hypertension    Peripheral vascular disease ([place])    DVT   Pre-diabetes    Pulmonary embolism ([place]) 2013   Past Surgical History:  Procedure Laterality Date   APPENDECTOMY     PULMONARY THROMBECTOMY Bilateral 01/01/2022   Procedure: PULMONARY THROMBECTOMY;  Surgeon: Katha Cabal, MD;  Location: [facility];  Service: Cardiovascular;  Laterality: Bilateral;   TONSILLECTOMY AND ADENOIDECTOMY     TOTAL KNEE ARTHROPLASTY Left 12/09/2021   Procedure: LEFT TOTAL KNEE ARTHROPLASTY;  Surgeon: Newt Minion, MD;  Location: [facility];  Service: Orthopedics;  Laterality: Left;   Patient Active Problem List   Diagnosis Date Noted   Right groin pain 01/13/2022   Bilateral pulmonary embolism with right heart strain RV/LV ratio 1:5 ([place]) 12/31/2021   Acute respiratory failure with hypoxia ([place]) 12/31/2021   Total knee replacement status, left 12/09/21 12/09/2021   Unilateral primary osteoarthritis, left knee    Shoulder disorder  09/03/2021   Arthritis of knee 09/03/2021   Lumbar radiculopathy 09/03/2021   Abnormal MRI, knee 09/03/2021   Chronic pain of right knee 09/03/2021   Bronchitis 03/25/2021   Anemia 01/09/2020   HTN (hypertension) 12/31/2019   Chest pain 12/31/2019   DOE (dyspnea on exertion) 12/31/2019   Acute coronary syndrome ([place]) 12/31/2019   Unstable angina ([place]) 12/31/2019   Atherosclerosis 08/23/2018   Screening for tuberculosis 07/12/2018   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   Arthralgia 07/12/2018   Solitary pulmonary nodule 07/12/2018   Neck pain 02/15/2018   Dizziness 02/15/2018   Elevated TSH 02/15/2018   Screening for HIV (human immunodeficiency virus) 10/05/2017   Neuropathy 10/05/2017   Screening for colon cancer 10/05/2017   Hyperlipidemia 08/04/2016   Prediabetes 08/04/2016   Back pain 08/04/2016   Bilateral knee pain 06/18/2016   History of DVT of lower extremity 04/01/2014   History of DVT and PE (deep vein thrombosis) 05/04/2011    REFERRING DIAG: W09.811 (ICD-10-CM) - Status post total left   THERAPY DIAG:  Chronic pain of left knee  Difficulty in walking, not elsewhere classified  Stiffness of left knee, not elsewhere classified  Muscle weakness (generalized)  Rationale for Evaluation and Treatment Rehabilitation  PERTINENT HISTORY:  S/P L TKE 12/09/2021. The DVT and PE prevented her from participating  in much PT. Had home health PT for 4 weeks. Pt was using SPC R side prior to surgery. Heart doctor says her heart is fine (had an appointment yesterday).  PRECAUTIONS: no known precautions  SUBJECTIVE: 6/10 L posterior hip and 5/10 L knee pain when walking. No L knee pain when sitting.  Having pinns and needles at the bottom of her L foot.      PAIN:  Are you having pain? See subjective     TODAY'S TREATMENT:    Manual therapy   reclined STM Rectus femoris to decrease tension with knee in flexion   And proximal portion   Therapeutic exercise  Reclined    Hip extension isometrics, L leg straight 10x5 seconds for 3 sets   Transversus abdominis contraction to decrease low back extension pressure. 10x3 with 5 second holds   Seated L knee flexion PROM with PT 10x     Seated L knee flexion AAROM with PT 10x3   Improved exercise technique, movement at target joints, use of target muscles after min to mod verbal, visual, tactile cues.       Response to treatment Pt tolerated session well without aggravation of symptoms. Pt states that the exercises helped her L hip pain with knee flexion ROM. 80 degrees seated L knee flexion AROM after session.      Clinical impression   Pt L knee rehab seems to be complicated by L low back pain as well as L rectus femoris muscle tension/tightness. Worked on decreasing muscle tension at the aforementioned muscle as well as decreasing low back pressure followed by L knee ROM exercises. Pt was able to achieve 80 degrees seated L knee flexion AROM after session. Pt will benefit from continued skilled physical therapy to improve ROM, strength and function.        PATIENT EDUCATION: Education details: there-ex, HEP Person educated: Patient Education method: Explanation, Demonstration, Tactile cues, Verbal cues, and Handouts Education comprehension: verbalized understanding and returned demonstration   HOME EXERCISE PROGRAM: Access Code: [email or web address] URL: [email or web address] Date: 02/15/2022 Prepared by: Joneen Boers  Exercises - Seated Heel Slide  - 1 x daily - 7 x weekly - 3 sets - 10 reps - 5 seconds hold  Reclined   Hip extension isometrics, L leg straight 10x5 seconds for 3 sets  - Seated Transversus Abdominis Bracing  - 5 x daily - 7 x weekly - 3 sets - 10 reps - 5 seconds hold   PT Short Term Goals - 03/17/22 1720       PT SHORT TERM GOAL #1   Title Pt will be independent with her initial HEP to improve L knee AROM, strength, and ability to ambulate with less difficulty.     Baseline Doing her HEP.  Does not appear to have questions with the exercises (03/17/2022)    Time 3    Period Weeks    Status Achieved    Target Date 03/04/22              PT Long Term Goals - 03/17/22 1722       PT LONG TERM GOAL #1   Title Pt will be able to ambulate at least 500 ft without AD and no LOB to promote mobility.    Baseline Pt currenty ambulating with rw (02/11/2022); Able to ambulate 40 ft wiht SPC on R side prior to sitting rest break (03/17/2022)    Time 8    Period Weeks    Status On-going  Target Date 04/08/22      PT LONG TERM GOAL #2   Title Pt will improve her L knee extension AROM to 0 degrees and knee flexion AROM to at least 115 degrees to promote ability to ambulate and negotiate stairs with less difficulty.    Baseline Seated L knee AROM: -15 degrees extension, 62 degrees flexion (02/11/2022); -12 degrees extension, 75 degrees flexion (03/17/2022)    Time 8    Period Weeks    Status On-going    Target Date 04/08/22      PT LONG TERM GOAL #3   Title Pt will improve L knee flexion and extension strength by at least 1/2 MMT to promote ability to ambulate with less difficulty.    Baseline knee flexion 3+/5, extension 4-/5 (02/11/2022); knee flexion 3+/5, extension 5/5 (03/17/2022)    Time 8    Period Weeks    Status Partially Met    Target Date 04/08/22      PT LONG TERM GOAL #4   Title Pt will improve her L knee FOTO score by at least 10 points as a demonstration of improved function.    Baseline L knee FOTO 36 (02/11/2022), (03/17/2022)    Time 8    Period Weeks    Status On-going    Target Date 04/08/22              Plan - 03/24/22 1814     Clinical Impression Statement Pt L knee rehab seems to be complicated by L low back pain as well as L rectus femoris muscle tension/tightness. Worked on decreasing muscle tension at the aforementioned muscle as well as decreasing low back pressure followed by L knee ROM exercises. Pt was able  to achieve 80 degrees seated L knee flexion AROM after session. Pt will benefit from continued skilled physical therapy to improve ROM, strength and function.    Personal Factors and Comorbidities Age;Comorbidity 3+;Fitness;Past/Current Experience;Time since onset of injury/illness/exacerbation    Comorbidities DVT, arthritis, dyspnea, HTN, PE    Examination-Activity Limitations Bathing;Stairs;Dressing;Stand;Bend;Lift;Transfers;Carry;Squat;Locomotion Level    Stability/Clinical Decision Making Stable/Uncomplicated    Clinical Decision Making Low    Rehab Potential Fair    PT Frequency 2x / week    PT Duration 8 weeks    PT Treatment/Interventions Therapeutic activities;Therapeutic exercise;Functional mobility training;Balance training;Neuromuscular re-education;Patient/family education;Manual techniques;Dry needling;Aquatic Therapy;Electrical Stimulation;Iontophoresis 32m/ml Dexamethasone;Gait training;Stair training    PT Next Visit Plan Knee A/AROM, hip and knee strengthening, gait training, manual techniques, modalities PRN    Consulted and Agree with Plan of Care Patient                       MJoneen BoersPT, DPT  03/24/2022, 6:15 PM

## 2022-03-30 ENCOUNTER — Ambulatory Visit: Payer: No Typology Code available for payment source

## 2022-03-30 DIAGNOSIS — R262 Difficulty in walking, not elsewhere classified: Secondary | ICD-10-CM

## 2022-03-30 DIAGNOSIS — M25662 Stiffness of left knee, not elsewhere classified: Secondary | ICD-10-CM

## 2022-03-30 DIAGNOSIS — G8929 Other chronic pain: Secondary | ICD-10-CM

## 2022-03-30 NOTE — Therapy (Signed)
OUTPATIENT PHYSICAL THERAPY TREATMENT NOTE   Patient Name: Leah Roberts MRN: [id number] DOB:21-Nov-1950, 71 y.o., female Today's Date: 03/30/2022  PCP: Burnard Hawthorne, FNP REFERRING PROVIDER: Newt Minion, MD   PT End of Session - 03/30/22 1515     Visit Number 12    Number of Visits 17    Date for PT Re-Evaluation 04/08/22    Authorization Type 2    Authorization Time Period of 10 progress report    PT Start Time 1515    PT Stop Time 1600    PT Time Calculation (min) 45 min    Activity Tolerance Patient tolerated treatment well    Behavior During Therapy WFL for tasks assessed/performed                      Past Medical History:  Diagnosis Date   Arthritis    DVT (deep venous thrombosis) ([place]) 2013   Dyspnea    due to knee pain   Family history of adverse reaction to anesthesia    Brother and sister hard to wake up.   GERD (gastroesophageal reflux disease)    History of blood transfusion    History of hypertension    Hypertension    Peripheral vascular disease ([place])    DVT   Pre-diabetes    Pulmonary embolism ([place]) 2013   Past Surgical History:  Procedure Laterality Date   APPENDECTOMY     PULMONARY THROMBECTOMY Bilateral 01/01/2022   Procedure: PULMONARY THROMBECTOMY;  Surgeon: Katha Cabal, MD;  Location: [facility];  Service: Cardiovascular;  Laterality: Bilateral;   TONSILLECTOMY AND ADENOIDECTOMY     TOTAL KNEE ARTHROPLASTY Left 12/09/2021   Procedure: LEFT TOTAL KNEE ARTHROPLASTY;  Surgeon: Newt Minion, MD;  Location: [facility];  Service: Orthopedics;  Laterality: Left;   Patient Active Problem List   Diagnosis Date Noted   Right groin pain 01/13/2022   Bilateral pulmonary embolism with right heart strain RV/LV ratio 1:5 ([place]) 12/31/2021   Acute respiratory failure with hypoxia ([place]) 12/31/2021   Total knee replacement status, left 12/09/21 12/09/2021   Unilateral primary osteoarthritis, left knee    Shoulder disorder  09/03/2021   Arthritis of knee 09/03/2021   Lumbar radiculopathy 09/03/2021   Abnormal MRI, knee 09/03/2021   Chronic pain of right knee 09/03/2021   Bronchitis 03/25/2021   Anemia 01/09/2020   HTN (hypertension) 12/31/2019   Chest pain 12/31/2019   DOE (dyspnea on exertion) 12/31/2019   Acute coronary syndrome ([place]) 12/31/2019   Unstable angina ([place]) 12/31/2019   Atherosclerosis 08/23/2018   Screening for tuberculosis 07/12/2018   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   Arthralgia 07/12/2018   Solitary pulmonary nodule 07/12/2018   Neck pain 02/15/2018   Dizziness 02/15/2018   Elevated TSH 02/15/2018   Screening for HIV (human immunodeficiency virus) 10/05/2017   Neuropathy 10/05/2017   Screening for colon cancer 10/05/2017   Hyperlipidemia 08/04/2016   Prediabetes 08/04/2016   Back pain 08/04/2016   Bilateral knee pain 06/18/2016   History of DVT of lower extremity 04/01/2014   History of DVT and PE (deep vein thrombosis) 05/04/2011    REFERRING DIAG: W10.932 (ICD-10-CM) - Status post total left   THERAPY DIAG:  Chronic pain of left knee  Difficulty in walking, not elsewhere classified  Stiffness of left knee, not elsewhere classified  Rationale for Evaluation and Treatment Rehabilitation  PERTINENT HISTORY:  S/P L TKE 12/09/2021. The DVT and PE prevented her from participating in much PT.  Had home health PT for 4 weeks. Pt was using SPC R side prior to surgery. Heart doctor says her heart is fine (had an appointment yesterday).  PRECAUTIONS: no known precautions  SUBJECTIVE: L knee is quite good. Less pain. Still has some stiffness L lateral knee that blocks her from bending it. Has been trying to walk with the [facility] at home. Has L lateral hip pain (7/10 when walking).    PAIN:  Are you having pain? See subjective     TODAY'S TREATMENT:    Manual therapy   reclined STM L Rectus femoris muscle to decrease tension with knee in extension, propped on a  pillow     Therapeutic exercise  Reclined   Hip extension isometrics, L leg straight 10x5 seconds for 3 sets   Transversus abdominis contraction to decrease low back extension pressure. 10x3 with 5 second holds    Seated L knee flexion PROM with PT 10x     Seated L knee flexion AAROM with PT 10x3   Seated L knee flexion AROM 79 degrees after session      Improved exercise technique, movement at target joints, use of target muscles after min to mod verbal, visual, tactile cues.       Response to treatment Pt tolerated session well without aggravation of symptoms.        Clinical impression   Continued working on decreasing lumbar extension pressure as well as L rectus femoris muscle tightness to promote ability to bend L leg at knee joint more comfortably. Still demonstrates stiffness at L knee joint as well. Able to achieve 79 degrees seated L knee flexion AROM after treatment. Pt will benefit from continued skilled physical therapy to improve ROM, strength and function.        PATIENT EDUCATION: Education details: there-ex, HEP Person educated: Patient Education method: Explanation, Demonstration, Tactile cues, Verbal cues, and Handouts Education comprehension: verbalized understanding and returned demonstration   HOME EXERCISE PROGRAM: Access Code: [email or web address] URL: [email or web address] Date: 02/15/2022 Prepared by: Joneen Boers  Exercises - Seated Heel Slide  - 1 x daily - 7 x weekly - 3 sets - 10 reps - 5 seconds hold  Reclined   Hip extension isometrics, L leg straight 10x5 seconds for 3 sets  - Seated Transversus Abdominis Bracing  - 5 x daily - 7 x weekly - 3 sets - 10 reps - 5 seconds hold   PT Short Term Goals - 03/17/22 1720       PT SHORT TERM GOAL #1   Title Pt will be independent with her initial HEP to improve L knee AROM, strength, and ability to ambulate with less difficulty.    Baseline Doing her HEP.  Does not appear to  have questions with the exercises (03/17/2022)    Time 3    Period Weeks    Status Achieved    Target Date 03/04/22              PT Long Term Goals - 03/17/22 1722       PT LONG TERM GOAL #1   Title Pt will be able to ambulate at least 500 ft without AD and no LOB to promote mobility.    Baseline Pt currenty ambulating with rw (02/11/2022); Able to ambulate 40 ft wiht SPC on R side prior to sitting rest break (03/17/2022)    Time 8    Period Weeks    Status On-going    Target Date 04/08/22  PT LONG TERM GOAL #2   Title Pt will improve her L knee extension AROM to 0 degrees and knee flexion AROM to at least 115 degrees to promote ability to ambulate and negotiate stairs with less difficulty.    Baseline Seated L knee AROM: -15 degrees extension, 62 degrees flexion (02/11/2022); -12 degrees extension, 75 degrees flexion (03/17/2022)    Time 8    Period Weeks    Status On-going    Target Date 04/08/22      PT LONG TERM GOAL #3   Title Pt will improve L knee flexion and extension strength by at least 1/2 MMT to promote ability to ambulate with less difficulty.    Baseline knee flexion 3+/5, extension 4-/5 (02/11/2022); knee flexion 3+/5, extension 5/5 (03/17/2022)    Time 8    Period Weeks    Status Partially Met    Target Date 04/08/22      PT LONG TERM GOAL #4   Title Pt will improve her L knee FOTO score by at least 10 points as a demonstration of improved function.    Baseline L knee FOTO 36 (02/11/2022), (03/17/2022)    Time 8    Period Weeks    Status On-going    Target Date 04/08/22              Plan - 03/30/22 1515     Clinical Impression Statement Continued working on decreasing lumbar extension pressure as well as L rectus femoris muscle tightness to promote ability to bend L leg at knee joint more comfortably. Still demonstrates stiffness at L knee joint as well. Able to achieve 79 degrees seated L knee flexion AROM after treatment. Pt will benefit  from continued skilled physical therapy to improve ROM, strength and function.    Personal Factors and Comorbidities Age;Comorbidity 3+;Fitness;Past/Current Experience;Time since onset of injury/illness/exacerbation    Comorbidities DVT, arthritis, dyspnea, HTN, PE    Examination-Activity Limitations Bathing;Stairs;Dressing;Stand;Bend;Lift;Transfers;Carry;Squat;Locomotion Level    Stability/Clinical Decision Making Stable/Uncomplicated    Rehab Potential Fair    PT Frequency 2x / week    PT Duration 8 weeks    PT Treatment/Interventions Therapeutic activities;Therapeutic exercise;Functional mobility training;Balance training;Neuromuscular re-education;Patient/family education;Manual techniques;Dry needling;Aquatic Therapy;Electrical Stimulation;Iontophoresis 67m/ml Dexamethasone;Gait training;Stair training    PT Next Visit Plan Knee A/AROM, hip and knee strengthening, gait training, manual techniques, modalities PRN    Consulted and Agree with Plan of Care Patient                       MJoneen BoersPT, DPT  03/30/2022, 4:10 PM

## 2022-03-31 ENCOUNTER — Telehealth: Payer: Self-pay | Admitting: Orthopedic Surgery

## 2022-03-31 NOTE — Telephone Encounter (Signed)
Pt called requesting a refill of oxycodone. Pt has an appt 12/4 with Dr Lajoyce Corners. Please send to pharmacy on file. Pt phone number is [phone].

## 2022-03-31 NOTE — Telephone Encounter (Signed)
Pt is s/p a left total knee replacement on 12/09/2021. She is asking for a refill of her Oxycodone last filled 03/19/2022 #14 please advise.

## 2022-04-01 ENCOUNTER — Ambulatory Visit: Payer: No Typology Code available for payment source

## 2022-04-01 ENCOUNTER — Other Ambulatory Visit: Payer: Self-pay | Admitting: Orthopedic Surgery

## 2022-04-01 ENCOUNTER — Other Ambulatory Visit: Payer: Self-pay

## 2022-04-01 DIAGNOSIS — G8929 Other chronic pain: Secondary | ICD-10-CM

## 2022-04-01 DIAGNOSIS — R262 Difficulty in walking, not elsewhere classified: Secondary | ICD-10-CM

## 2022-04-01 DIAGNOSIS — M25662 Stiffness of left knee, not elsewhere classified: Secondary | ICD-10-CM

## 2022-04-01 DIAGNOSIS — M6281 Muscle weakness (generalized): Secondary | ICD-10-CM

## 2022-04-01 NOTE — Therapy (Signed)
OUTPATIENT PHYSICAL THERAPY TREATMENT NOTE   Patient Name: Leah Roberts MRN: [id number] DOB:01-07-1951, 71 y.o., female Today's Date: 04/01/2022  PCP: Burnard Hawthorne, FNP REFERRING PROVIDER: Newt Minion, MD   PT End of Session - 04/01/22 1505     Visit Number 13    Number of Visits 17    Date for PT Re-Evaluation 04/08/22    Authorization Type 3    Authorization Time Period of 10 progress report    PT Start Time 1506    PT Stop Time 1548    PT Time Calculation (min) 42 min    Activity Tolerance Patient tolerated treatment well    Behavior During Therapy WFL for tasks assessed/performed                       Past Medical History:  Diagnosis Date   Arthritis    DVT (deep venous thrombosis) ([place]) 2013   Dyspnea    due to knee pain   Family history of adverse reaction to anesthesia    Brother and sister hard to wake up.   GERD (gastroesophageal reflux disease)    History of blood transfusion    History of hypertension    Hypertension    Peripheral vascular disease ([place])    DVT   Pre-diabetes    Pulmonary embolism ([place]) 2013   Past Surgical History:  Procedure Laterality Date   APPENDECTOMY     PULMONARY THROMBECTOMY Bilateral 01/01/2022   Procedure: PULMONARY THROMBECTOMY;  Surgeon: Katha Cabal, MD;  Location: [facility];  Service: Cardiovascular;  Laterality: Bilateral;   TONSILLECTOMY AND ADENOIDECTOMY     TOTAL KNEE ARTHROPLASTY Left 12/09/2021   Procedure: LEFT TOTAL KNEE ARTHROPLASTY;  Surgeon: Newt Minion, MD;  Location: [facility];  Service: Orthopedics;  Laterality: Left;   Patient Active Problem List   Diagnosis Date Noted   Right groin pain 01/13/2022   Bilateral pulmonary embolism with right heart strain RV/LV ratio 1:5 ([place]) 12/31/2021   Acute respiratory failure with hypoxia ([place]) 12/31/2021   Total knee replacement status, left 12/09/21 12/09/2021   Unilateral primary osteoarthritis, left knee    Shoulder  disorder 09/03/2021   Arthritis of knee 09/03/2021   Lumbar radiculopathy 09/03/2021   Abnormal MRI, knee 09/03/2021   Chronic pain of right knee 09/03/2021   Bronchitis 03/25/2021   Anemia 01/09/2020   HTN (hypertension) 12/31/2019   Chest pain 12/31/2019   DOE (dyspnea on exertion) 12/31/2019   Acute coronary syndrome ([place]) 12/31/2019   Unstable angina ([place]) 12/31/2019   Atherosclerosis 08/23/2018   Screening for tuberculosis 07/12/2018   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   Arthralgia 07/12/2018   Solitary pulmonary nodule 07/12/2018   Neck pain 02/15/2018   Dizziness 02/15/2018   Elevated TSH 02/15/2018   Screening for HIV (human immunodeficiency virus) 10/05/2017   Neuropathy 10/05/2017   Screening for colon cancer 10/05/2017   Hyperlipidemia 08/04/2016   Prediabetes 08/04/2016   Back pain 08/04/2016   Bilateral knee pain 06/18/2016   History of DVT of lower extremity 04/01/2014   History of DVT and PE (deep vein thrombosis) 05/04/2011    REFERRING DIAG: D74.128 (ICD-10-CM) - Status post total left   THERAPY DIAG:  Chronic pain of left knee  Difficulty in walking, not elsewhere classified  Stiffness of left knee, not elsewhere classified  Muscle weakness (generalized)  Rationale for Evaluation and Treatment Rehabilitation  PERTINENT HISTORY:  S/P L TKE 12/09/2021. The DVT and PE prevented her  from participating in much PT. Had home health PT for 4 weeks. Pt was using SPC R side prior to surgery. Heart doctor says her heart is fine (had an appointment yesterday).  PRECAUTIONS: no known precautions  SUBJECTIVE: L knee is getting better. Not really having pain in her L knee currently.    PAIN:  Are you having pain? See subjective     TODAY'S TREATMENT:    Manual therapy  Seated STM L rectus femoris and vastus lateralis muscle to decrease tension   Seated STM medial and lateral knee to improve fascial mobility.         Therapeutic exercise  Gait with SPC  on R side 100 ft CGA  Sit <> stand from elevated mat table, 22 inches high 5x2, B UE assist to stand, no UE assist to sit.   Standing R LE static mini lunge with L UE assist 5x3  Max cues on proper mechanics.    Good L glute max muscle use reported.   Seated L knee flexion PROM with PT 10x    Seated L knee flexion AAROM with PT 10x3   Seated L knee flexion AROM 81 degrees afterwards  Forward step up onto 4 inch step with B UE assist   L 10x2     Improved exercise technique, movement at target joints, use of target muscles after min to mod verbal, visual, tactile cues.       Response to treatment Pt tolerated session well without aggravation of symptoms.        Clinical impression   Improved seated L knee flexion AROM today to 81 degrees which is a 19 degree improvement from her initial evaluation measurement. Worked on L LE strengthening and ambulation with SPC to improve ability to ambulate as well as negotiate curbs with less difficulty.  Pt will benefit from continued skilled physical therapy services to improve ROM, strength and function.        PATIENT EDUCATION: Education details: there-ex, HEP Person educated: Patient Education method: Explanation, Demonstration, Tactile cues, Verbal cues, and Handouts Education comprehension: verbalized understanding and returned demonstration   HOME EXERCISE PROGRAM: Access Code: [email or web address] URL: [email or web address] Date: 02/15/2022 Prepared by: Joneen Boers  Exercises - Seated Heel Slide  - 1 x daily - 7 x weekly - 3 sets - 10 reps - 5 seconds hold  Reclined   Hip extension isometrics, L leg straight 10x5 seconds for 3 sets  - Seated Transversus Abdominis Bracing  - 5 x daily - 7 x weekly - 3 sets - 10 reps - 5 seconds hold   PT Short Term Goals - 03/17/22 1720       PT SHORT TERM GOAL #1   Title Pt will be independent with her initial HEP to improve L knee AROM, strength, and ability to  ambulate with less difficulty.    Baseline Doing her HEP.  Does not appear to have questions with the exercises (03/17/2022)    Time 3    Period Weeks    Status Achieved    Target Date 03/04/22              PT Long Term Goals - 03/17/22 1722       PT LONG TERM GOAL #1   Title Pt will be able to ambulate at least 500 ft without AD and no LOB to promote mobility.    Baseline Pt currenty ambulating with rw (02/11/2022); Able to ambulate 40 ft wiht SPC on R side prior  to sitting rest break (03/17/2022)    Time 8    Period Weeks    Status On-going    Target Date 04/08/22      PT LONG TERM GOAL #2   Title Pt will improve her L knee extension AROM to 0 degrees and knee flexion AROM to at least 115 degrees to promote ability to ambulate and negotiate stairs with less difficulty.    Baseline Seated L knee AROM: -15 degrees extension, 62 degrees flexion (02/11/2022); -12 degrees extension, 75 degrees flexion (03/17/2022)    Time 8    Period Weeks    Status On-going    Target Date 04/08/22      PT LONG TERM GOAL #3   Title Pt will improve L knee flexion and extension strength by at least 1/2 MMT to promote ability to ambulate with less difficulty.    Baseline knee flexion 3+/5, extension 4-/5 (02/11/2022); knee flexion 3+/5, extension 5/5 (03/17/2022)    Time 8    Period Weeks    Status Partially Met    Target Date 04/08/22      PT LONG TERM GOAL #4   Title Pt will improve her L knee FOTO score by at least 10 points as a demonstration of improved function.    Baseline L knee FOTO 36 (02/11/2022), (03/17/2022)    Time 8    Period Weeks    Status On-going    Target Date 04/08/22              Plan - 04/01/22 1550     Clinical Impression Statement Improved seated L knee flexion AROM today to 81 degrees which is a 19 degree improvement from her initial evaluation measurement. Worked on L LE strengthening and ambulation with SPC to improve ability to ambulate as well as  negotiate curbs with less difficulty.  Pt will benefit from continued skilled physical therapy services to improve ROM, strength and function.    Personal Factors and Comorbidities Age;Comorbidity 3+;Fitness;Past/Current Experience;Time since onset of injury/illness/exacerbation    Comorbidities DVT, arthritis, dyspnea, HTN, PE    Examination-Activity Limitations Bathing;Stairs;Dressing;Stand;Bend;Lift;Transfers;Carry;Squat;Locomotion Level    Stability/Clinical Decision Making Stable/Uncomplicated    Rehab Potential Fair    PT Frequency 2x / week    PT Duration 8 weeks    PT Treatment/Interventions Therapeutic activities;Therapeutic exercise;Functional mobility training;Balance training;Neuromuscular re-education;Patient/family education;Manual techniques;Dry needling;Aquatic Therapy;Electrical Stimulation;Iontophoresis 56m/ml Dexamethasone;Gait training;Stair training    PT Next Visit Plan Knee A/AROM, hip and knee strengthening, gait training, manual techniques, modalities PRN    Consulted and Agree with Plan of Care Patient                        MJoneen BoersPT, DPT  04/01/2022, 3:51 PM

## 2022-04-02 ENCOUNTER — Other Ambulatory Visit: Payer: Self-pay

## 2022-04-02 MED FILL — Oxycodone w/ Acetaminophen Tab 5-325 MG: ORAL | 5 days supply | Qty: 10 | Fill #0 | Status: AC

## 2022-04-04 ENCOUNTER — Other Ambulatory Visit: Payer: Self-pay

## 2022-04-05 ENCOUNTER — Other Ambulatory Visit: Payer: Self-pay

## 2022-04-05 ENCOUNTER — Encounter: Payer: Self-pay | Admitting: Orthopedic Surgery

## 2022-04-05 ENCOUNTER — Ambulatory Visit (INDEPENDENT_AMBULATORY_CARE_PROVIDER_SITE_OTHER): Payer: Self-pay | Admitting: Orthopedic Surgery

## 2022-04-05 ENCOUNTER — Ambulatory Visit (INDEPENDENT_AMBULATORY_CARE_PROVIDER_SITE_OTHER): Payer: Self-pay

## 2022-04-05 DIAGNOSIS — M5440 Lumbago with sciatica, unspecified side: Secondary | ICD-10-CM

## 2022-04-05 DIAGNOSIS — M5410 Radiculopathy, site unspecified: Secondary | ICD-10-CM

## 2022-04-05 MED ORDER — PREDNISONE 10 MG PO TABS
10.0000 mg | ORAL_TABLET | Freq: Every day | ORAL | 0 refills | Status: DC
  Filled 2022-04-05: qty 30, 30d supply, fill #0

## 2022-04-05 NOTE — Progress Notes (Signed)
Office Visit Note   Patient: Leah Roberts           Date of Birth: 1950-06-16           MRN: [id number] Visit Date: 04/05/2022              Requested by: Allegra Grana, FNP [address] [place],  [place] [address] PCP: Allegra Grana, FNP  Chief Complaint  Patient presents with   Left Knee - Follow-up    12/09/21 left TKA      HPI: Patient is a 71 year old woman who presents in follow-up for 2 separate issues.  #1 she is status post left total knee arthroplasty she is very pleased with her progress.  Patient states he recently developed pain over the lateral leg just inferior to her total knee she has pain radiating to the buttocks that goes to the lateral of her leg and the plantar aspect of her foot.  Patient is concerned this may be related to her total knee.  Assessment & Plan: Visit Diagnoses:  1. Low back pain with sciatica, sciatica laterality unspecified, unspecified back pain laterality, unspecified chronicity   2. Radicular pain of left lower extremity     Plan: Patient is provided a prescription for prednisone 10 mg with breakfast daily recommended heat for her lumbar spine.  Her symptoms seem to be completely related to her sciatic nerve.  May need to obtain an MRI scan of her lumbar spine at follow-up.  Follow-Up Instructions: Return in about 4 weeks (around 05/03/2022).   Ortho Exam  Patient is alert, oriented, no adenopathy, well-dressed, normal affect, normal respiratory effort. Examination patient has range of motion left knee from 0 to 120 degrees there is no effusion collateral ligaments are stable.  Patient has a positive straight leg raise on the left and this reproduces her radicular symptoms.  There is no focal motor weakness.  Patient at times states that her leg does feel weak.  Good  Imaging: XR Lumbar Spine 2-3 Views  Result Date: 04/05/2022 2 view radiographs of the lumbar spine shows facet arthropathy throughout the lower  lumbar spine with foraminal collapse through the lower lumbar spine.  No anterior osteophytic bone spurs.  The hip joints are congruent.  No images are attached to the encounter.  Labs: Lab Results  Component Value Date   HGBA1C 6.2 02/15/2018   HGBA1C 6.2 10/05/2017   HGBA1C 6.3 06/18/2016   ESRSEDRATE 10 05/29/2020   CRP <1.0 05/29/2020     Lab Results  Component Value Date   ALBUMIN 4.0 03/08/2022   ALBUMIN 3.7 01/13/2022   ALBUMIN 3.7 12/30/2021    Lab Results  Component Value Date   MG 2.0 01/04/2022   MG 2.0 01/03/2022   MG 1.8 01/02/2022   Lab Results  Component Value Date   VD25OH 21.43 (L) 10/05/2017    No results found for: "PREALBUMIN"    Latest Ref Rng & Units 03/08/2022    1:56 PM 01/13/2022   12:16 PM 01/04/2022    4:13 AM  CBC EXTENDED  WBC 4.0 - 10.5 K/uL 6.2  4.3  8.1   RBC 3.87 - 5.11 Mil/uL 4.77  3.82  3.51   Hemoglobin 12.0 - 15.0 g/dL 45.0  38.8  9.1   HCT 82.8 - 46.0 % 39.2  32.2  29.3   Platelets 150.0 - 400.0 K/uL 225.0  274.0  182   NEUT# 1.4 - 7.7 K/uL 2.8  2.1  Lymph# 0.7 - 4.0 K/uL 2.9  1.8       There is no height or weight on file to calculate BMI.  Orders:  Orders Placed This Encounter  Procedures   XR Lumbar Spine 2-3 Views   Meds ordered this encounter  Medications   predniSONE (DELTASONE) 10 MG tablet    Sig: Take 1 tablet (10 mg total) by mouth daily with breakfast.    Dispense:  30 tablet    Refill:  0     Procedures: No procedures performed  Clinical Data: No additional findings.  ROS:  All other systems negative, except as noted in the HPI. Review of Systems  Objective: Vital Signs: There were no vitals taken for this visit.  Specialty Comments:  No specialty comments available.  PMFS History: Patient Active Problem List   Diagnosis Date Noted   Right groin pain 01/13/2022   Bilateral pulmonary embolism with right heart strain RV/LV ratio 1:5 ([facility]) 12/31/2021   Acute respiratory failure with  hypoxia ([facility]) 12/31/2021   Total knee replacement status, left 12/09/21 12/09/2021   Unilateral primary osteoarthritis, left knee    Shoulder disorder 09/03/2021   Arthritis of knee 09/03/2021   Lumbar radiculopathy 09/03/2021   Abnormal MRI, knee 09/03/2021   Chronic pain of right knee 09/03/2021   Bronchitis 03/25/2021   Anemia 01/09/2020   HTN (hypertension) 12/31/2019   Chest pain 12/31/2019   DOE (dyspnea on exertion) 12/31/2019   Acute coronary syndrome ([facility]) 12/31/2019   Unstable angina ([facility]) 12/31/2019   Atherosclerosis 08/23/2018   Screening for tuberculosis 07/12/2018   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   Arthralgia 07/12/2018   Solitary pulmonary nodule 07/12/2018   Neck pain 02/15/2018   Dizziness 02/15/2018   Elevated TSH 02/15/2018   Screening for HIV (human immunodeficiency virus) 10/05/2017   Neuropathy 10/05/2017   Screening for colon cancer 10/05/2017   Hyperlipidemia 08/04/2016   Prediabetes 08/04/2016   Back pain 08/04/2016   Bilateral knee pain 06/18/2016   History of DVT of lower extremity 04/01/2014   History of DVT and PE (deep vein thrombosis) 05/04/2011   Past Medical History:  Diagnosis Date   Arthritis    DVT (deep venous thrombosis) ([facility]) 2013   Dyspnea    due to knee pain   Family history of adverse reaction to anesthesia    Brother and sister hard to wake up.   GERD (gastroesophageal reflux disease)    History of blood transfusion    History of hypertension    Hypertension    Peripheral vascular disease ([facility])    DVT   Pre-diabetes    Pulmonary embolism ([facility]) 2013    Family History  Problem Relation Age of Onset   Hypertension Father    Heart disease Father 84   Thyroid disease Sister    Allergies Sister    CVA Neg Hx    Heart attack Neg Hx    High Cholesterol Neg Hx    Colon cancer Neg Hx     Past Surgical History:  Procedure Laterality Date   APPENDECTOMY     PULMONARY THROMBECTOMY Bilateral 01/01/2022   Procedure: PULMONARY  THROMBECTOMY;  Surgeon: Renford Dills, MD;  Location: [facility];  Service: Cardiovascular;  Laterality: Bilateral;   TONSILLECTOMY AND ADENOIDECTOMY     TOTAL KNEE ARTHROPLASTY Left 12/09/2021   Procedure: LEFT TOTAL KNEE ARTHROPLASTY;  Surgeon: Nadara Mustard, MD;  Location: [facility];  Service: Orthopedics;  Laterality: Left;   Social History  Occupational History   Not on file  Tobacco Use   Smoking status: Never   Smokeless tobacco: Never  Substance and Sexual Activity   Alcohol use: Yes    Comment: Ocassional glass of wine   Drug use: No   Sexual activity: Not on file

## 2022-04-06 ENCOUNTER — Ambulatory Visit: Payer: No Typology Code available for payment source | Attending: Orthopedic Surgery

## 2022-04-06 DIAGNOSIS — M6281 Muscle weakness (generalized): Secondary | ICD-10-CM | POA: Insufficient documentation

## 2022-04-06 DIAGNOSIS — M25562 Pain in left knee: Secondary | ICD-10-CM | POA: Insufficient documentation

## 2022-04-06 DIAGNOSIS — R262 Difficulty in walking, not elsewhere classified: Secondary | ICD-10-CM | POA: Insufficient documentation

## 2022-04-06 DIAGNOSIS — G8929 Other chronic pain: Secondary | ICD-10-CM | POA: Insufficient documentation

## 2022-04-06 DIAGNOSIS — M25662 Stiffness of left knee, not elsewhere classified: Secondary | ICD-10-CM | POA: Insufficient documentation

## 2022-04-06 NOTE — Therapy (Signed)
OUTPATIENT PHYSICAL THERAPY TREATMENT NOTE   Patient Name: Leah Roberts MRN: [id number] DOB:[birth year], 71 y.o., female Today's Date: 04/06/2022  PCP: Burnard Hawthorne, FNP REFERRING PROVIDER: Newt Minion, MD   PT End of Session - 04/06/22 1550     Visit Number 14    Number of Visits 17    Date for PT Re-Evaluation 04/08/22    Authorization Type 4    Authorization Time Period of 10 progress report    PT Start Time 6063    PT Stop Time 1633    PT Time Calculation (min) 43 min    Activity Tolerance Patient tolerated treatment well    Behavior During Therapy WFL for tasks assessed/performed                        Past Medical History:  Diagnosis Date   Arthritis    DVT (deep venous thrombosis) ([place]) 2013   Dyspnea    due to knee pain   Family history of adverse reaction to anesthesia    Brother and sister hard to wake up.   GERD (gastroesophageal reflux disease)    History of blood transfusion    History of hypertension    Hypertension    Peripheral vascular disease ([place])    DVT   Pre-diabetes    Pulmonary embolism ([place]) 2013   Past Surgical History:  Procedure Laterality Date   APPENDECTOMY     PULMONARY THROMBECTOMY Bilateral 01/01/2022   Procedure: PULMONARY THROMBECTOMY;  Surgeon: Katha Cabal, MD;  Location: [facility];  Service: Cardiovascular;  Laterality: Bilateral;   TONSILLECTOMY AND ADENOIDECTOMY     TOTAL KNEE ARTHROPLASTY Left 12/09/2021   Procedure: LEFT TOTAL KNEE ARTHROPLASTY;  Surgeon: Newt Minion, MD;  Location: [facility];  Service: Orthopedics;  Laterality: Left;   Patient Active Problem List   Diagnosis Date Noted   Right groin pain 01/13/2022   Bilateral pulmonary embolism with right heart strain RV/LV ratio 1:5 ([place]) 12/31/2021   Acute respiratory failure with hypoxia ([place]) 12/31/2021   Total knee replacement status, left 12/09/21 12/09/2021   Unilateral primary osteoarthritis, left knee    Shoulder  disorder 09/03/2021   Arthritis of knee 09/03/2021   Lumbar radiculopathy 09/03/2021   Abnormal MRI, knee 09/03/2021   Chronic pain of right knee 09/03/2021   Bronchitis 03/25/2021   Anemia 01/09/2020   HTN (hypertension) 12/31/2019   Chest pain 12/31/2019   DOE (dyspnea on exertion) 12/31/2019   Acute coronary syndrome ([place]) 12/31/2019   Unstable angina ([place]) 12/31/2019   Atherosclerosis 08/23/2018   Screening for tuberculosis 07/12/2018   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   Arthralgia 07/12/2018   Solitary pulmonary nodule 07/12/2018   Neck pain 02/15/2018   Dizziness 02/15/2018   Elevated TSH 02/15/2018   Screening for HIV (human immunodeficiency virus) 10/05/2017   Neuropathy 10/05/2017   Screening for colon cancer 10/05/2017   Hyperlipidemia 08/04/2016   Prediabetes 08/04/2016   Back pain 08/04/2016   Bilateral knee pain 06/18/2016   History of DVT of lower extremity 04/01/2014   History of DVT and PE (deep vein thrombosis) 05/04/2011    REFERRING DIAG: K16.010 (ICD-10-CM) - Status post total left   THERAPY DIAG:  Chronic pain of left knee  Difficulty in walking, not elsewhere classified  Stiffness of left knee, not elsewhere classified  Muscle weakness (generalized)  Rationale for Evaluation and Treatment Rehabilitation  PERTINENT HISTORY:  S/P L TKE 12/09/2021. The DVT and PE prevented  her from participating in much PT. Had home health PT for 4 weeks. Pt was using SPC R side prior to surgery. Heart doctor says her heart is fine (had an appointment yesterday).  PRECAUTIONS: no known precautions  SUBJECTIVE: L knee is doing better. The MD appointment went well. Took a back x-ray and MD is assuming sciatica for now. Seeing Dr. Sharol Given back in 4 weeks. L knee feels stiff. Dr. Sharol Given mentioned that the nerve was the one blocking her L knee bending (L lateral knee tightness). 6/10 L knee pain currently.     PAIN:  Are you having pain? See subjective     TODAY'S TREATMENT:     Manual therapy   Seated STM L lateral knee around distal insertion of IT band  Seated STM L rectus femoris and vastus lateralis muscle to decrease tension        Therapeutic exercise  Seated L knee flexion AROM at start of session: 74 degrees with L lateral knee tightness (around IT band area)   Seated L knee flexion PROM with PT 10x    Seated L knee flexion AAROM with PT 10x3   Seated L knee flexion AROM 77 degrees afterwards  Sit <> stand from elevated mat table, 22 inches high 5x2, B UE assist to stand, no UE assist to sit.    Gait with SPC on R side 210 ft CGA  Forward step up onto 4 inch step with B UE assist   L 10x   Improved exercise technique, movement at target joints, use of target muscles after min to mod verbal, visual, tactile cues.       Response to treatment Pt tolerated session well without aggravation of symptoms.        Clinical impression   Continued working on improving L knee flexion ROM secondary to stiffness. Continued improving L LE functional strength to decrease difficulty with gait, curb negotiation and transfers. Able to ambulate up to 210 ft with [facility] today. Pt will benefit from continued skilled physical therapy services to improve ROM, strength and function.        PATIENT EDUCATION: Education details: there-ex, HEP Person educated: Patient Education method: Explanation, Demonstration, Tactile cues, Verbal cues, and Handouts Education comprehension: verbalized understanding and returned demonstration   HOME EXERCISE PROGRAM: Access Code: [email or web address] URL: [email or web address] Date: 02/15/2022 Prepared by: Joneen Boers  Exercises - Seated Heel Slide  - 1 x daily - 7 x weekly - 3 sets - 10 reps - 5 seconds hold  Reclined   Hip extension isometrics, L leg straight 10x5 seconds for 3 sets  - Seated Transversus Abdominis Bracing  - 5 x daily - 7 x weekly - 3 sets - 10 reps - 5 seconds hold   PT Short  Term Goals - 03/17/22 1720       PT SHORT TERM GOAL #1   Title Pt will be independent with her initial HEP to improve L knee AROM, strength, and ability to ambulate with less difficulty.    Baseline Doing her HEP.  Does not appear to have questions with the exercises (03/17/2022)    Time 3    Period Weeks    Status Achieved    Target Date 03/04/22              PT Long Term Goals - 03/17/22 1722       PT LONG TERM GOAL #1   Title Pt will be able to ambulate at least 500 ft without AD  and no LOB to promote mobility.    Baseline Pt currenty ambulating with rw (02/11/2022); Able to ambulate 40 ft wiht SPC on R side prior to sitting rest break (03/17/2022)    Time 8    Period Weeks    Status On-going    Target Date 04/08/22      PT LONG TERM GOAL #2   Title Pt will improve her L knee extension AROM to 0 degrees and knee flexion AROM to at least 115 degrees to promote ability to ambulate and negotiate stairs with less difficulty.    Baseline Seated L knee AROM: -15 degrees extension, 62 degrees flexion (02/11/2022); -12 degrees extension, 75 degrees flexion (03/17/2022)    Time 8    Period Weeks    Status On-going    Target Date 04/08/22      PT LONG TERM GOAL #3   Title Pt will improve L knee flexion and extension strength by at least 1/2 MMT to promote ability to ambulate with less difficulty.    Baseline knee flexion 3+/5, extension 4-/5 (02/11/2022); knee flexion 3+/5, extension 5/5 (03/17/2022)    Time 8    Period Weeks    Status Partially Met    Target Date 04/08/22      PT LONG TERM GOAL #4   Title Pt will improve her L knee FOTO score by at least 10 points as a demonstration of improved function.    Baseline L knee FOTO 36 (02/11/2022), (03/17/2022)    Time 8    Period Weeks    Status On-going    Target Date 04/08/22              Plan - 04/06/22 1549     Clinical Impression Statement Continued working on improving L knee flexion ROM secondary to  stiffness. Continued improving L LE functional strength to decrease difficulty with gait, curb negotiation and transfers. Able to ambulate up to 210 ft with [facility] today. Pt will benefit from continued skilled physical therapy services to improve ROM, strength and function.    Personal Factors and Comorbidities Age;Comorbidity 3+;Fitness;Past/Current Experience;Time since onset of injury/illness/exacerbation    Comorbidities DVT, arthritis, dyspnea, HTN, PE    Examination-Activity Limitations Bathing;Stairs;Dressing;Stand;Bend;Lift;Transfers;Carry;Squat;Locomotion Level    Stability/Clinical Decision Making Stable/Uncomplicated    Clinical Decision Making Low    Rehab Potential Fair    PT Frequency 2x / week    PT Duration 8 weeks    PT Treatment/Interventions Therapeutic activities;Therapeutic exercise;Functional mobility training;Balance training;Neuromuscular re-education;Patient/family education;Manual techniques;Dry needling;Aquatic Therapy;Electrical Stimulation;Iontophoresis 6m/ml Dexamethasone;Gait training;Stair training    PT Next Visit Plan Knee A/AROM, hip and knee strengthening, gait training, manual techniques, modalities PRN    Consulted and Agree with Plan of Care Patient                         MJoneen BoersPT, DPT  04/06/2022, 5:33 PM

## 2022-04-08 ENCOUNTER — Ambulatory Visit: Payer: No Typology Code available for payment source

## 2022-04-08 DIAGNOSIS — G8929 Other chronic pain: Secondary | ICD-10-CM

## 2022-04-08 DIAGNOSIS — M25662 Stiffness of left knee, not elsewhere classified: Secondary | ICD-10-CM

## 2022-04-08 DIAGNOSIS — M6281 Muscle weakness (generalized): Secondary | ICD-10-CM

## 2022-04-08 DIAGNOSIS — R262 Difficulty in walking, not elsewhere classified: Secondary | ICD-10-CM

## 2022-04-08 NOTE — Therapy (Addendum)
OUTPATIENT PHYSICAL THERAPY TREATMENT NOTE   Patient Name: Leah Roberts MRN: [id number] DOB:[birth year], 71 y.o., female Today's Date: 04/08/2022  PCP: Burnard Hawthorne, FNP REFERRING PROVIDER: Newt Minion, MD   PT End of Session - 04/08/22 1612     Visit Number 15    Number of Visits 33   Date for PT Re-Evaluation 06/03/21    Authorization Type 5    Authorization Time Period of 10 progress report    PT Start Time 1544    PT Stop Time 1626    PT Time Calculation (min) 42 min    Activity Tolerance Patient tolerated treatment well    Behavior During Therapy WFL for tasks assessed/performed                         Past Medical History:  Diagnosis Date   Arthritis    DVT (deep venous thrombosis) ([place]) 2013   Dyspnea    due to knee pain   Family history of adverse reaction to anesthesia    Brother and sister hard to wake up.   GERD (gastroesophageal reflux disease)    History of blood transfusion    History of hypertension    Hypertension    Peripheral vascular disease ([place])    DVT   Pre-diabetes    Pulmonary embolism ([place]) 2013   Past Surgical History:  Procedure Laterality Date   APPENDECTOMY     PULMONARY THROMBECTOMY Bilateral 01/01/2022   Procedure: PULMONARY THROMBECTOMY;  Surgeon: Katha Cabal, MD;  Location: [facility];  Service: Cardiovascular;  Laterality: Bilateral;   TONSILLECTOMY AND ADENOIDECTOMY     TOTAL KNEE ARTHROPLASTY Left 12/09/2021   Procedure: LEFT TOTAL KNEE ARTHROPLASTY;  Surgeon: Newt Minion, MD;  Location: [facility];  Service: Orthopedics;  Laterality: Left;   Patient Active Problem List   Diagnosis Date Noted   Right groin pain 01/13/2022   Bilateral pulmonary embolism with right heart strain RV/LV ratio 1:5 ([place]) 12/31/2021   Acute respiratory failure with hypoxia ([place]) 12/31/2021   Total knee replacement status, left 12/09/21 12/09/2021   Unilateral primary osteoarthritis, left knee    Shoulder  disorder 09/03/2021   Arthritis of knee 09/03/2021   Lumbar radiculopathy 09/03/2021   Abnormal MRI, knee 09/03/2021   Chronic pain of right knee 09/03/2021   Bronchitis 03/25/2021   Anemia 01/09/2020   HTN (hypertension) 12/31/2019   Chest pain 12/31/2019   DOE (dyspnea on exertion) 12/31/2019   Acute coronary syndrome ([place]) 12/31/2019   Unstable angina ([place]) 12/31/2019   Atherosclerosis 08/23/2018   Screening for tuberculosis 07/12/2018   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   Arthralgia 07/12/2018   Solitary pulmonary nodule 07/12/2018   Neck pain 02/15/2018   Dizziness 02/15/2018   Elevated TSH 02/15/2018   Screening for HIV (human immunodeficiency virus) 10/05/2017   Neuropathy 10/05/2017   Screening for colon cancer 10/05/2017   Hyperlipidemia 08/04/2016   Prediabetes 08/04/2016   Back pain 08/04/2016   Bilateral knee pain 06/18/2016   History of DVT of lower extremity 04/01/2014   History of DVT and PE (deep vein thrombosis) 05/04/2011    REFERRING DIAG: O97.353 (ICD-10-CM) - Status post total left   THERAPY DIAG:  Chronic pain of left knee  Difficulty in walking, not elsewhere classified  Stiffness of left knee, not elsewhere classified  Muscle weakness (generalized)  Rationale for Evaluation and Treatment Rehabilitation  PERTINENT HISTORY:  S/P L TKE 12/09/2021. The DVT and PE prevented  her from participating in much PT. Had home health PT for 4 weeks. Pt was using SPC R side prior to surgery. Heart doctor says her heart is fine (had an appointment yesterday).  PRECAUTIONS: no known precautions  SUBJECTIVE: L knee is getting much better. The medication is doing a good job. 4/10 L knee pain currently. Better able to put her sock onto her L foot.      PAIN:  Are you having pain? See subjective     TODAY'S TREATMENT:    Manual therapy  Seated STM L lateral knee around distal insertion of IT band  Seated STM L rectus femoris and vastus lateralis muscle to  decrease tension        Therapeutic exercise  Seated L knee flexion PROM with PT 10x    Seated L knee flexion AAROM with PT 10x3   Seated L knee flexion AROM 80 degrees afterwards  Seated L LE neural flossing 10x3  Decreased L plantar foot discomfort.   Gait with SPC on R side 200 ft CGA   Forward step up onto first regular step with B UE assist   L 10x2  Standing L TKE with B UE assist to improve extension ROM 10x5 seconds    Improved exercise technique, movement at target joints, use of target muscles after min to mod verbal, visual, tactile cues.       Response to treatment Pt tolerated session well without aggravation of symptoms.        Clinical impression   Continued working on improving L knee flexion ROM secondary to stiffness. Continued improving L LE functional strength to decrease difficulty with gait, curb negotiation and transfers.Worked on L LE neural flossing secondary to tension observed along sciatic distribution. Decreased L foot discomfort after L LE neural flossing. Pt will benefit from continued skilled physical therapy services to improve ROM, strength and function.        PATIENT EDUCATION: Education details: there-ex, HEP Person educated: Patient Education method: Explanation, Demonstration, Tactile cues, Verbal cues, and Handouts Education comprehension: verbalized understanding and returned demonstration   HOME EXERCISE PROGRAM: Access Code: [email or web address] URL: [email or web address] Date: 02/15/2022 Prepared by: Joneen Boers  Exercises - Seated Heel Slide  - 1 x daily - 7 x weekly - 3 sets - 10 reps - 5 seconds hold  Reclined   Hip extension isometrics, L leg straight 10x5 seconds for 3 sets  - Seated Transversus Abdominis Bracing  - 5 x daily - 7 x weekly - 3 sets - 10 reps - 5 seconds hold   PT Short Term Goals - 03/17/22 1720       PT SHORT TERM GOAL #1   Title Pt will be independent with her initial HEP to  improve L knee AROM, strength, and ability to ambulate with less difficulty.    Baseline Doing her HEP.  Does not appear to have questions with the exercises (03/17/2022)    Time 3    Period Weeks    Status Achieved    Target Date 03/04/22              PT Long Term Goals - 03/17/22 1722       PT LONG TERM GOAL #1   Title Pt will be able to ambulate at least 500 ft without AD and no LOB to promote mobility.    Baseline Pt currenty ambulating with rw (02/11/2022); Able to ambulate 40 ft wiht SPC on R side prior to sitting rest break (03/17/2022)  Time 8    Period Weeks    Status On-going    Target Date 06/03/21      PT LONG TERM GOAL #2   Title Pt will improve her L knee extension AROM to 0 degrees and knee flexion AROM to at least 115 degrees to promote ability to ambulate and negotiate stairs with less difficulty.    Baseline Seated L knee AROM: -15 degrees extension, 62 degrees flexion (02/11/2022); -12 degrees extension, 75 degrees flexion (03/17/2022)    Time 8    Period Weeks    Status On-going    Target Date 06/03/21      PT LONG TERM GOAL #3   Title Pt will improve L knee flexion and extension strength by at least 1/2 MMT to promote ability to ambulate with less difficulty.    Baseline knee flexion 3+/5, extension 4-/5 (02/11/2022); knee flexion 3+/5, extension 5/5 (03/17/2022)    Time 8    Period Weeks    Status Partially Met    Target Date 06/03/21      PT LONG TERM GOAL #4   Title Pt will improve her L knee FOTO score by at least 10 points as a demonstration of improved function.    Baseline L knee FOTO 36 (02/11/2022), (03/17/2022)    Time 8    Period Weeks    Status On-going    Target Date 06/03/21              Plan - 04/08/22 1544     Clinical Impression Statement Continued working on improving L knee flexion ROM secondary to stiffness. Continued improving L LE functional strength to decrease difficulty with gait, curb negotiation and transfers.Worked on  L LE neural flossing secondary to tension observed along sciatic distribution. Decreased L foot discomfort after L LE neural flossing. Pt will benefit from continued skilled physical therapy services to improve ROM, strength and function.    Personal Factors and Comorbidities Age;Comorbidity 3+;Fitness;Past/Current Experience;Time since onset of injury/illness/exacerbation    Comorbidities DVT, arthritis, dyspnea, HTN, PE    Examination-Activity Limitations Bathing;Stairs;Dressing;Stand;Bend;Lift;Transfers;Carry;Squat;Locomotion Level    Stability/Clinical Decision Making Stable/Uncomplicated    Rehab Potential Fair    PT Frequency 2x / week    PT Duration 8 weeks    PT Treatment/Interventions Therapeutic activities;Therapeutic exercise;Functional mobility training;Balance training;Neuromuscular re-education;Patient/family education;Manual techniques;Dry needling;Aquatic Therapy;Electrical Stimulation;Iontophoresis 72m/ml Dexamethasone;Gait training;Stair training    PT Next Visit Plan Knee A/AROM, hip and knee strengthening, gait training, manual techniques, modalities PRN    Consulted and Agree with Plan of Care Patient                         MJoneen BoersPT, DPT  04/08/2022, 4:28 PM

## 2022-04-12 ENCOUNTER — Ambulatory Visit: Payer: No Typology Code available for payment source

## 2022-04-14 ENCOUNTER — Ambulatory Visit: Payer: No Typology Code available for payment source

## 2022-04-14 DIAGNOSIS — M6281 Muscle weakness (generalized): Secondary | ICD-10-CM

## 2022-04-14 DIAGNOSIS — G8929 Other chronic pain: Secondary | ICD-10-CM

## 2022-04-14 DIAGNOSIS — R262 Difficulty in walking, not elsewhere classified: Secondary | ICD-10-CM

## 2022-04-14 DIAGNOSIS — M25662 Stiffness of left knee, not elsewhere classified: Secondary | ICD-10-CM

## 2022-04-14 NOTE — Therapy (Addendum)
OUTPATIENT PHYSICAL THERAPY TREATMENT NOTE   Patient Name: Leah Roberts MRN: [id number] DOB:1951-03-22, 71 y.o., female Today's Date: 04/14/2022  PCP: Burnard Hawthorne, FNP REFERRING PROVIDER: Newt Minion, MD   PT End of Session - 04/14/22 1153     Visit Number 16    Number of Visits 33   Date for PT Re-Evaluation 06/03/21    Authorization Type 6    Authorization Time Period of 10 progress report    PT Start Time 1151    PT Stop Time 1232    PT Time Calculation (min) 41 min    Activity Tolerance Patient tolerated treatment well    Behavior During Therapy WFL for tasks assessed/performed                          Past Medical History:  Diagnosis Date   Arthritis    DVT (deep venous thrombosis) ([place]) 2013   Dyspnea    due to knee pain   Family history of adverse reaction to anesthesia    Brother and sister hard to wake up.   GERD (gastroesophageal reflux disease)    History of blood transfusion    History of hypertension    Hypertension    Peripheral vascular disease ([place])    DVT   Pre-diabetes    Pulmonary embolism ([place]) 2013   Past Surgical History:  Procedure Laterality Date   APPENDECTOMY     PULMONARY THROMBECTOMY Bilateral 01/01/2022   Procedure: PULMONARY THROMBECTOMY;  Surgeon: Katha Cabal, MD;  Location: [facility];  Service: Cardiovascular;  Laterality: Bilateral;   TONSILLECTOMY AND ADENOIDECTOMY     TOTAL KNEE ARTHROPLASTY Left 12/09/2021   Procedure: LEFT TOTAL KNEE ARTHROPLASTY;  Surgeon: Newt Minion, MD;  Location: [facility];  Service: Orthopedics;  Laterality: Left;   Patient Active Problem List   Diagnosis Date Noted   Right groin pain 01/13/2022   Bilateral pulmonary embolism with right heart strain RV/LV ratio 1:5 ([place]) 12/31/2021   Acute respiratory failure with hypoxia ([place]) 12/31/2021   Total knee replacement status, left 12/09/21 12/09/2021   Unilateral primary osteoarthritis, left knee    Shoulder  disorder 09/03/2021   Arthritis of knee 09/03/2021   Lumbar radiculopathy 09/03/2021   Abnormal MRI, knee 09/03/2021   Chronic pain of right knee 09/03/2021   Bronchitis 03/25/2021   Anemia 01/09/2020   HTN (hypertension) 12/31/2019   Chest pain 12/31/2019   DOE (dyspnea on exertion) 12/31/2019   Acute coronary syndrome ([place]) 12/31/2019   Unstable angina ([place]) 12/31/2019   Atherosclerosis 08/23/2018   Screening for tuberculosis 07/12/2018   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   Arthralgia 07/12/2018   Solitary pulmonary nodule 07/12/2018   Neck pain 02/15/2018   Dizziness 02/15/2018   Elevated TSH 02/15/2018   Screening for HIV (human immunodeficiency virus) 10/05/2017   Neuropathy 10/05/2017   Screening for colon cancer 10/05/2017   Hyperlipidemia 08/04/2016   Prediabetes 08/04/2016   Back pain 08/04/2016   Bilateral knee pain 06/18/2016   History of DVT of lower extremity 04/01/2014   History of DVT and PE (deep vein thrombosis) 05/04/2011    REFERRING DIAG: I33.825 (ICD-10-CM) - Status post total left   THERAPY DIAG:  Chronic pain of left knee  Difficulty in walking, not elsewhere classified  Stiffness of left knee, not elsewhere classified  Muscle weakness (generalized)  Rationale for Evaluation and Treatment Rehabilitation  PERTINENT HISTORY:  S/P L TKE 12/09/2021. The DVT and PE  prevented her from participating in much PT. Had home health PT for 4 weeks. Pt was using SPC R side prior to surgery. Heart doctor says her heart is fine (had an appointment yesterday).  PRECAUTIONS: no known precautions  SUBJECTIVE: Decided to start using the [facility]. The pain is manageable, 5/10 L knee pain when walking. Has been doing her HEP at home     PAIN:  Are you having pain? See subjective     TODAY'S TREATMENT:    Manual therapy  Seated STM L lateral knee around distal insertion of IT band  Seated STM L rectus femoris and vastus lateralis muscle to decrease tension   Seated  STM L anterior knee to decrease fascial restrictions    Therapeutic exercise  Seated L knee AROM at start of session  Knee flexion 81 degrees   Seated L knee flexion PROM with PT 10x    Seated L knee flexion AAROM with PT 10x3   Seated L knee flexion AROM 84 degrees afterwards  Seated LAQ 10x5 seconds   Seated L LE neural flossing 10x3   Standing mini squats 8x. Difficulty secondary to R knee pain.    Improved exercise technique, movement at target joints, use of target muscles after min to mod verbal, visual, tactile cues.       Response to treatment Pt tolerated session well without aggravation of symptoms.        Clinical impression   Improved starting L knee flexion AROM to 81 degrees. Continued working on improving L knee flexion ROM secondary to stiffness. Continued improving L LE functional strength to decrease difficulty with gait, curb negotiation and transfers.Continued working on L LE neural flossing secondary to tension observed along sciatic distribution. Currently able to ambulate safely with SPC. Pt will benefit from continued skilled physical therapy services to improve ROM, strength and function.        PATIENT EDUCATION: Education details: there-ex, HEP Person educated: Patient Education method: Explanation, Demonstration, Tactile cues, Verbal cues, and Handouts Education comprehension: verbalized understanding and returned demonstration   HOME EXERCISE PROGRAM: Access Code: [email or web address] URL: [email or web address] Date: 02/15/2022 Prepared by: Joneen Boers  Exercises - Seated Heel Slide  - 1 x daily - 7 x weekly - 3 sets - 10 reps - 5 seconds hold  Reclined   Hip extension isometrics, L leg straight 10x5 seconds for 3 sets  - Seated Transversus Abdominis Bracing  - 5 x daily - 7 x weekly - 3 sets - 10 reps - 5 seconds hold   PT Short Term Goals - 03/17/22 1720       PT SHORT TERM GOAL #1   Title Pt will be independent  with her initial HEP to improve L knee AROM, strength, and ability to ambulate with less difficulty.    Baseline Doing her HEP.  Does not appear to have questions with the exercises (03/17/2022)    Time 3    Period Weeks    Status Achieved    Target Date 03/04/22               PT Long Term Goals - 03/17/22 1722                PT LONG TERM GOAL #1    Title Pt will be able to ambulate at least 500 ft without AD and no LOB to promote mobility.     Baseline Pt currenty ambulating with rw (02/11/2022); Able to ambulate 40 ft wiht SPC on R  side prior to sitting rest break (03/17/2022)     Time 8     Period Weeks     Status On-going     Target Date 06/03/21          PT LONG TERM GOAL #2    Title Pt will improve her L knee extension AROM to 0 degrees and knee flexion AROM to at least 115 degrees to promote ability to ambulate and negotiate stairs with less difficulty.     Baseline Seated L knee AROM: -15 degrees extension, 62 degrees flexion (02/11/2022); -12 degrees extension, 75 degrees flexion (03/17/2022)     Time 8     Period Weeks     Status On-going     Target Date 06/03/21          PT LONG TERM GOAL #3    Title Pt will improve L knee flexion and extension strength by at least 1/2 MMT to promote ability to ambulate with less difficulty.     Baseline knee flexion 3+/5, extension 4-/5 (02/11/2022); knee flexion 3+/5, extension 5/5 (03/17/2022)     Time 8     Period Weeks     Status Partially Met     Target Date 06/03/21          PT LONG TERM GOAL #4    Title Pt will improve her L knee FOTO score by at least 10 points as a demonstration of improved function.     Baseline L knee FOTO 36 (02/11/2022), (03/17/2022)     Time 8     Period Weeks     Status On-going     Target Date 06/03/21                   Plan - 04/14/22 1152     Clinical Impression Statement Improved starting L knee flexion AROM to 81 degrees. Continued working on improving L knee flexion ROM secondary to  stiffness. Continued improving L LE functional strength to decrease difficulty with gait, curb negotiation and transfers.Continued working on L LE neural flossing secondary to tension observed along sciatic distribution. Currently able to ambulate safely with SPC. Pt will benefit from continued skilled physical therapy services to improve ROM, strength and function.    Personal Factors and Comorbidities Age;Comorbidity 3+;Fitness;Past/Current Experience;Time since onset of injury/illness/exacerbation    Comorbidities DVT, arthritis, dyspnea, HTN, PE    Examination-Activity Limitations Bathing;Stairs;Dressing;Stand;Bend;Lift;Transfers;Carry;Squat;Locomotion Level    Stability/Clinical Decision Making Stable/Uncomplicated    Clinical Decision Making Low    Rehab Potential Fair    PT Frequency 2x / week    PT Duration 8 weeks    PT Treatment/Interventions Therapeutic activities;Therapeutic exercise;Functional mobility training;Balance training;Neuromuscular re-education;Patient/family education;Manual techniques;Dry needling;Aquatic Therapy;Electrical Stimulation;Iontophoresis 33m/ml Dexamethasone;Gait training;Stair training    PT Next Visit Plan Knee A/AROM, hip and knee strengthening, gait training, manual techniques, modalities PRN    Consulted and Agree with Plan of Care Patient                          MJoneen BoersPT, DPT  04/14/2022, 1:30 PM

## 2022-04-19 ENCOUNTER — Other Ambulatory Visit: Payer: Self-pay

## 2022-04-20 ENCOUNTER — Ambulatory Visit: Payer: No Typology Code available for payment source

## 2022-04-20 DIAGNOSIS — G8929 Other chronic pain: Secondary | ICD-10-CM

## 2022-04-20 DIAGNOSIS — R262 Difficulty in walking, not elsewhere classified: Secondary | ICD-10-CM

## 2022-04-20 DIAGNOSIS — M25662 Stiffness of left knee, not elsewhere classified: Secondary | ICD-10-CM

## 2022-04-20 DIAGNOSIS — M6281 Muscle weakness (generalized): Secondary | ICD-10-CM

## 2022-04-20 NOTE — Therapy (Addendum)
OUTPATIENT PHYSICAL THERAPY TREATMENT NOTE   Patient Name: Leah Roberts MRN: [id number] DOB:May 29, 1950, 71 y.o., female Today's Date: 04/20/2022  PCP: Burnard Hawthorne, FNP REFERRING PROVIDER: Newt Minion, MD   PT End of Session - 04/20/22 1551     Visit Number 17    Number of Visits 33   Date for PT Re-Evaluation 06/03/21    Authorization Type 7    Authorization Time Period of 10 progress report    PT Start Time 1551    PT Stop Time 1631    PT Time Calculation (min) 40 min    Activity Tolerance Patient tolerated treatment well    Behavior During Therapy WFL for tasks assessed/performed                          Past Medical History:  Diagnosis Date   Arthritis    DVT (deep venous thrombosis) ([place]) 2013   Dyspnea    due to knee pain   Family history of adverse reaction to anesthesia    Brother and sister hard to wake up.   GERD (gastroesophageal reflux disease)    History of blood transfusion    History of hypertension    Hypertension    Peripheral vascular disease ([place])    DVT   Pre-diabetes    Pulmonary embolism ([place]) 2013   Past Surgical History:  Procedure Laterality Date   APPENDECTOMY     PULMONARY THROMBECTOMY Bilateral 01/01/2022   Procedure: PULMONARY THROMBECTOMY;  Surgeon: Katha Cabal, MD;  Location: [facility];  Service: Cardiovascular;  Laterality: Bilateral;   TONSILLECTOMY AND ADENOIDECTOMY     TOTAL KNEE ARTHROPLASTY Left 12/09/2021   Procedure: LEFT TOTAL KNEE ARTHROPLASTY;  Surgeon: Newt Minion, MD;  Location: [facility];  Service: Orthopedics;  Laterality: Left;   Patient Active Problem List   Diagnosis Date Noted   Right groin pain 01/13/2022   Bilateral pulmonary embolism with right heart strain RV/LV ratio 1:5 ([place]) 12/31/2021   Acute respiratory failure with hypoxia ([place]) 12/31/2021   Total knee replacement status, left 12/09/21 12/09/2021   Unilateral primary osteoarthritis, left knee    Shoulder  disorder 09/03/2021   Arthritis of knee 09/03/2021   Lumbar radiculopathy 09/03/2021   Abnormal MRI, knee 09/03/2021   Chronic pain of right knee 09/03/2021   Bronchitis 03/25/2021   Anemia 01/09/2020   HTN (hypertension) 12/31/2019   Chest pain 12/31/2019   DOE (dyspnea on exertion) 12/31/2019   Acute coronary syndrome ([place]) 12/31/2019   Unstable angina ([place]) 12/31/2019   Atherosclerosis 08/23/2018   Screening for tuberculosis 07/12/2018   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   Arthralgia 07/12/2018   Solitary pulmonary nodule 07/12/2018   Neck pain 02/15/2018   Dizziness 02/15/2018   Elevated TSH 02/15/2018   Screening for HIV (human immunodeficiency virus) 10/05/2017   Neuropathy 10/05/2017   Screening for colon cancer 10/05/2017   Hyperlipidemia 08/04/2016   Prediabetes 08/04/2016   Back pain 08/04/2016   Bilateral knee pain 06/18/2016   History of DVT of lower extremity 04/01/2014   History of DVT and PE (deep vein thrombosis) 05/04/2011    REFERRING DIAG: S34.196 (ICD-10-CM) - Status post total left   THERAPY DIAG:  Chronic pain of left knee  Difficulty in walking, not elsewhere classified  Stiffness of left knee, not elsewhere classified  Muscle weakness (generalized)  Rationale for Evaluation and Treatment Rehabilitation  PERTINENT HISTORY:  S/P L TKE 12/09/2021. The DVT and PE  prevented her from participating in much PT. Had home health PT for 4 weeks. Pt was using SPC R side prior to surgery. Heart doctor says her heart is fine (had an appointment yesterday).  PRECAUTIONS: no known precautions  SUBJECTIVE: Was walking with her SPC last Friday but felt as if something went wrong, like something has moved. Was ok Wed, Thursday, and Friday morning and afternoon. L knee bothered her Friday evening. 6/10 currently. The tighness and heaviness in her L leg (around IT band and L5 dermatome area) is what bothers her.        PAIN:  Are you having pain? See  subjective     TODAY'S TREATMENT:    Manual therapy  Seated STM L lumbar paraspinal muscles to decrease tension  Slight decrease in L leg heaviness with gait   Seated STM L lateral knee around distal insertion of IT band  Seated STM L rectus femoris and vastus lateralis and peroneal muscles to decrease tension   Seated STM L anterior knee to decrease fascial restrictions     Therapeutic exercise  Pt currently walking with rw  Seated L knee flexion PROM with PT 10x    Seated L knee flexion AAROM with PT 10x3   81 degrees seated L knee flexion AROM after treatment  Seated L LE neural flossing 10x3   Stand to sit from elevated mat table (21 inches) without UE assist 10x  Seated trunk flexion stretch 5x5 seconds     Improved exercise technique, movement at target joints, use of target muscles after min to mod verbal, visual, tactile cues.       Response to treatment Pt tolerated session well without aggravation of symptoms. Decreased L LE heaviness during gait reported after session.        Clinical impression  Continued working on improving L knee flexion ROM secondary to stiffness. Worked on decreasing low back extension stress to L LE nerves. Decreased L LE heaviness sensation reported after session during gait. Pt able to maintain 81 degrees seated L knee flexion AROM. Pt will benefit from continued skilled physical therapy services to improve ROM, strength and function.        PATIENT EDUCATION: Education details: there-ex, HEP Person educated: Patient Education method: Explanation, Demonstration, Tactile cues, Verbal cues, and Handouts Education comprehension: verbalized understanding and returned demonstration   HOME EXERCISE PROGRAM: Access Code: [email or web address] URL: [email or web address] Date: 02/15/2022 Prepared by: Joneen Boers  Exercises - Seated Heel Slide  - 1 x daily - 7 x weekly - 3 sets - 10 reps - 5 seconds  hold  Reclined   Hip extension isometrics, L leg straight 10x5 seconds for 3 sets  - Seated Transversus Abdominis Bracing  - 5 x daily - 7 x weekly - 3 sets - 10 reps - 5 seconds hold   Seated trunk flexion stretch   PT Short Term Goals - 03/17/22 1720       PT SHORT TERM GOAL #1   Title Pt will be independent with her initial HEP to improve L knee AROM, strength, and ability to ambulate with less difficulty.    Baseline Doing her HEP.  Does not appear to have questions with the exercises (03/17/2022)    Time 3    Period Weeks    Status Achieved    Target Date 03/04/22               PT Long Term Goals - 03/17/22 1722  PT LONG TERM GOAL #1    Title Pt will be able to ambulate at least 500 ft without AD and no LOB to promote mobility.     Baseline Pt currenty ambulating with rw (02/11/2022); Able to ambulate 40 ft wiht SPC on R side prior to sitting rest break (03/17/2022)     Time 8     Period Weeks     Status On-going     Target Date 06/03/21          PT LONG TERM GOAL #2    Title Pt will improve her L knee extension AROM to 0 degrees and knee flexion AROM to at least 115 degrees to promote ability to ambulate and negotiate stairs with less difficulty.     Baseline Seated L knee AROM: -15 degrees extension, 62 degrees flexion (02/11/2022); -12 degrees extension, 75 degrees flexion (03/17/2022)     Time 8     Period Weeks     Status On-going     Target Date 06/03/21          PT LONG TERM GOAL #3    Title Pt will improve L knee flexion and extension strength by at least 1/2 MMT to promote ability to ambulate with less difficulty.     Baseline knee flexion 3+/5, extension 4-/5 (02/11/2022); knee flexion 3+/5, extension 5/5 (03/17/2022)     Time 8     Period Weeks     Status Partially Met     Target Date 06/03/21          PT LONG TERM GOAL #4    Title Pt will improve her L knee FOTO score by at least 10 points as a demonstration of improved function.      Baseline L knee FOTO 36 (02/11/2022), (03/17/2022)     Time 8     Period Weeks     Status On-going     Target Date 06/03/21                   Plan - 04/20/22 1625     Clinical Impression Statement Continued working on improving L knee flexion ROM secondary to stiffness. Worked on decreasing low back extension stress to L LE nerves. Decreased L LE heaviness sensation reported after session during gait. Pt able to maintain 81 degrees seated L knee flexion AROM. Pt will benefit from continued skilled physical therapy services to improve ROM, strength and function.    Personal Factors and Comorbidities Age;Comorbidity 3+;Fitness;Past/Current Experience;Time since onset of injury/illness/exacerbation    Comorbidities DVT, arthritis, dyspnea, HTN, PE    Examination-Activity Limitations Bathing;Stairs;Dressing;Stand;Bend;Lift;Transfers;Carry;Squat;Locomotion Level    Stability/Clinical Decision Making Stable/Uncomplicated    Clinical Decision Making Low    Rehab Potential Fair    PT Frequency 2x / week    PT Duration 8 weeks    PT Treatment/Interventions Therapeutic activities;Therapeutic exercise;Functional mobility training;Balance training;Neuromuscular re-education;Patient/family education;Manual techniques;Dry needling;Aquatic Therapy;Electrical Stimulation;Iontophoresis 78m/ml Dexamethasone;Gait training;Stair training    PT Next Visit Plan Knee A/AROM, hip and knee strengthening, gait training, manual techniques, modalities PRN    Consulted and Agree with Plan of Care Patient                           MJoneen BoersPT, DPT  04/20/2022, 6:19 PM

## 2022-04-21 ENCOUNTER — Other Ambulatory Visit: Payer: Self-pay

## 2022-04-21 ENCOUNTER — Telehealth: Payer: Self-pay | Admitting: Orthopedic Surgery

## 2022-04-21 MED ORDER — OXYCODONE-ACETAMINOPHEN 5-325 MG PO TABS
1.0000 | ORAL_TABLET | Freq: Two times a day (BID) | ORAL | 0 refills | Status: DC | PRN
  Filled 2022-04-21: qty 8, 4d supply, fill #0

## 2022-04-21 MED ORDER — METHOCARBAMOL 500 MG PO TABS
500.0000 mg | ORAL_TABLET | Freq: Three times a day (TID) | ORAL | 0 refills | Status: DC | PRN
  Filled 2022-04-21: qty 21, 7d supply, fill #0

## 2022-04-21 NOTE — Telephone Encounter (Signed)
Patient called. She would like a refill on oxycodone and robaxin. Her call back number is [phone]

## 2022-04-21 NOTE — Telephone Encounter (Signed)
S/p total knee in August and last refill was 04/02/2022 #10 of oxycodone do you wish to refill?

## 2022-04-22 ENCOUNTER — Ambulatory Visit: Payer: No Typology Code available for payment source

## 2022-04-22 DIAGNOSIS — M6281 Muscle weakness (generalized): Secondary | ICD-10-CM

## 2022-04-22 DIAGNOSIS — R262 Difficulty in walking, not elsewhere classified: Secondary | ICD-10-CM

## 2022-04-22 DIAGNOSIS — G8929 Other chronic pain: Secondary | ICD-10-CM

## 2022-04-22 DIAGNOSIS — M25662 Stiffness of left knee, not elsewhere classified: Secondary | ICD-10-CM

## 2022-04-22 NOTE — Addendum Note (Signed)
Addended by: Charlene Brooke on: 04/22/2022 04:48 PM   Modules accepted: Orders

## 2022-04-22 NOTE — Therapy (Signed)
OUTPATIENT PHYSICAL THERAPY TREATMENT NOTE   Patient Name: Leah Roberts MRN: [id number] DOB:26-Jun-1950, 71 y.o., female Today's Date: 04/22/2022  PCP: Burnard Hawthorne, FNP REFERRING PROVIDER: Newt Minion, MD   PT End of Session - 04/22/22 1551     Visit Number 18    Number of Visits 33    Date for PT Re-Evaluation 06/03/22    Authorization Type 8    Authorization Time Period of 10 progress report    PT Start Time 1551    PT Stop Time 1633    PT Time Calculation (min) 42 min    Activity Tolerance Patient tolerated treatment well    Behavior During Therapy WFL for tasks assessed/performed                           Past Medical History:  Diagnosis Date   Arthritis    DVT (deep venous thrombosis) ([place]) 2013   Dyspnea    due to knee pain   Family history of adverse reaction to anesthesia    Brother and sister hard to wake up.   GERD (gastroesophageal reflux disease)    History of blood transfusion    History of hypertension    Hypertension    Peripheral vascular disease ([place])    DVT   Pre-diabetes    Pulmonary embolism ([place]) 2013   Past Surgical History:  Procedure Laterality Date   APPENDECTOMY     PULMONARY THROMBECTOMY Bilateral 01/01/2022   Procedure: PULMONARY THROMBECTOMY;  Surgeon: Katha Cabal, MD;  Location: [facility];  Service: Cardiovascular;  Laterality: Bilateral;   TONSILLECTOMY AND ADENOIDECTOMY     TOTAL KNEE ARTHROPLASTY Left 12/09/2021   Procedure: LEFT TOTAL KNEE ARTHROPLASTY;  Surgeon: Newt Minion, MD;  Location: [facility];  Service: Orthopedics;  Laterality: Left;   Patient Active Problem List   Diagnosis Date Noted   Right groin pain 01/13/2022   Bilateral pulmonary embolism with right heart strain RV/LV ratio 1:5 ([place]) 12/31/2021   Acute respiratory failure with hypoxia ([place]) 12/31/2021   Total knee replacement status, left 12/09/21 12/09/2021   Unilateral primary osteoarthritis, left knee     Shoulder disorder 09/03/2021   Arthritis of knee 09/03/2021   Lumbar radiculopathy 09/03/2021   Abnormal MRI, knee 09/03/2021   Chronic pain of right knee 09/03/2021   Bronchitis 03/25/2021   Anemia 01/09/2020   HTN (hypertension) 12/31/2019   Chest pain 12/31/2019   DOE (dyspnea on exertion) 12/31/2019   Acute coronary syndrome ([place]) 12/31/2019   Unstable angina ([place]) 12/31/2019   Atherosclerosis 08/23/2018   Screening for tuberculosis 07/12/2018   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   Arthralgia 07/12/2018   Solitary pulmonary nodule 07/12/2018   Neck pain 02/15/2018   Dizziness 02/15/2018   Elevated TSH 02/15/2018   Screening for HIV (human immunodeficiency virus) 10/05/2017   Neuropathy 10/05/2017   Screening for colon cancer 10/05/2017   Hyperlipidemia 08/04/2016   Prediabetes 08/04/2016   Back pain 08/04/2016   Bilateral knee pain 06/18/2016   History of DVT of lower extremity 04/01/2014   History of DVT and PE (deep vein thrombosis) 05/04/2011    REFERRING DIAG: C94.709 (ICD-10-CM) - Status post total left   THERAPY DIAG:  Chronic pain of left knee  Difficulty in walking, not elsewhere classified  Stiffness of left knee, not elsewhere classified  Muscle weakness (generalized)  Rationale for Evaluation and Treatment Rehabilitation  PERTINENT HISTORY:  S/P L TKE 12/09/2021. The DVT  and PE prevented her from participating in much PT. Had home health PT for 4 weeks. Pt was using SPC R side prior to surgery. Heart doctor says her heart is fine (had an appointment yesterday).  PRECAUTIONS: no known precautions  SUBJECTIVE: L knee is not too bad like the other day. Today L knee is hurting but not excruciating. 5/10 pain currently.      PAIN:  Are you having pain? See subjective     TODAY'S TREATMENT:    Manual therapy    Seated STM L lateral knee around distal insertion of IT band and anterior lateral leg to decrease fascial restrictions  Seated STM L rectus  femoris and vastus lateralis and peroneal muscles to decrease tension   Seated STM L anterior knee to decrease fascial restrictions     Therapeutic exercise  Pt currently walking with rw  Seated L knee AROM   Seated manually resisted L knee flexion and extension   Reviewed POC: continue 2x/week for 8 weeks  Seated L knee flexion PROM with PT 10x    Seated L knee flexion AAROM with PT 10x3   82 degrees seated L knee flexion AROM after treatment  Seated trunk flexion stretch 5x10 seconds for 2 sets to decrease low back pressure to L LE nerves  Forward step up onto first regular step with L LE with B UE assist 10x   Seated L LE neural flossing 10x2   Improved exercise technique, movement at target joints, use of target muscles after min to mod verbal, visual, tactile cues.       Response to treatment Pt tolerated session well without aggravation of symptoms.       Decreased L LE heaviness during gait reported after session.        Clinical impression   Pt demonstrates improved L knee flexion and extension AROM, improved L knee strength since initial evaluation. Pt was at one point ambulating with a SPC. L knee pain however increased therefore pt returned to rw to decrease pressure to her L knee. L knee AROM improved to 80 degrees which pt is currently able to maintain. Stiff end feel. Overall, pt making progress with PT towards goals. Continued working on improving L knee flexion ROM secondary to stiffness. Worked on decreasing low back extension stress to L LE nerves as well as improving L LE neural flossing.  Decreased L LE heaviness sensation reported after session during gait. Pt will benefit from continued skilled physical therapy services to improve ROM, strength and function.    RECERT 70/06/6376    PATIENT EDUCATION: Education details: there-ex, HEP Person educated: Patient Education method: Explanation, Demonstration, Tactile cues, Verbal cues, and  Handouts Education comprehension: verbalized understanding and returned demonstration   HOME EXERCISE PROGRAM: Access Code: [email or web address] URL: [email or web address] Date: 02/15/2022 Prepared by: Joneen Boers  Exercises - Seated Heel Slide  - 1 x daily - 7 x weekly - 3 sets - 10 reps - 5 seconds hold  Reclined   Hip extension isometrics, L leg straight 10x5 seconds for 3 sets  - Seated Transversus Abdominis Bracing  - 5 x daily - 7 x weekly - 3 sets - 10 reps - 5 seconds hold   Seated trunk flexion stretch   PT Short Term Goals - 03/17/22 1720       PT SHORT TERM GOAL #1   Title Pt will be independent with her initial HEP to improve L knee AROM, strength, and ability to ambulate with less difficulty.  Baseline Doing her HEP.  Does not appear to have questions with the exercises (03/17/2022)    Time 3    Period Weeks    Status Achieved    Target Date 03/04/22              PT Long Term Goals - 04/22/22 1553       PT LONG TERM GOAL #1   Title Pt will be able to ambulate at least 500 ft without AD and no LOB to promote mobility.    Baseline Pt currenty ambulating with rw (02/11/2022); Able to ambulate 40 ft wiht SPC on R side prior to sitting rest break (03/17/2022); initially ambulated with [facility] but L knee pain increased . Currently back to rw to decrease pressure to L knee. (04/22/2022)    Time 8    Period Weeks    Status On-going    Target Date 06/03/22      PT LONG TERM GOAL #2   Title Pt will improve her L knee extension AROM to 0 degrees and knee flexion AROM to at least 115 degrees to promote ability to ambulate and negotiate stairs with less difficulty.    Baseline Seated L knee AROM: -15 degrees extension, 62 degrees flexion (02/11/2022); -12 degrees extension, 75 degrees flexion (03/17/2022); -9 degrees extension, 80 degrees flexino AROM (04/22/2022)    Time 8    Period Weeks    Status Partially Met    Target Date 06/03/22      PT LONG TERM GOAL  #3   Title Pt will improve L knee flexion and extension strength by at least 1/2 MMT to promote ability to ambulate with less difficulty.    Baseline knee flexion 3+/5, extension 4-/5 (02/11/2022); knee flexion 3+/5, extension 5/5 (03/17/2022); knee flexion 4-/5, knee extension 5/5 (04/22/2022)    Time 8    Period Weeks    Status Partially Met    Target Date 06/03/22      PT LONG TERM GOAL #4   Title Pt will improve her L knee FOTO score by at least 10 points as a demonstration of improved function.    Baseline L knee FOTO 36 (02/11/2022), (03/17/2022); 31.1 (04/22/2022)    Time 8    Period Weeks    Status On-going    Target Date 06/03/22              Plan - 04/22/22 1635     Clinical Impression Statement Pt demonstrates improved L knee flexion and extension AROM, improved L knee strength since initial evaluation. Pt was at one point ambulating with a SPC. L knee pain however increased therefore pt returned to rw to decrease pressure to her L knee. L knee AROM improved to 80 degrees which pt is currently able to maintain. Stiff end feel. Overall, pt making progress with PT towards goals. Continued working on improving L knee flexion ROM secondary to stiffness. Worked on decreasing low back extension stress to L LE nerves as well as improving L LE neural flossing.  Decreased L LE heaviness sensation reported after session during gait. Pt will benefit from continued skilled physical therapy services to improve ROM, strength and function.    Personal Factors and Comorbidities Age;Comorbidity 3+;Fitness;Past/Current Experience;Time since onset of injury/illness/exacerbation    Comorbidities DVT, arthritis, dyspnea, HTN, PE    Examination-Activity Limitations Bathing;Stairs;Dressing;Stand;Bend;Lift;Transfers;Carry;Squat;Locomotion Level    Stability/Clinical Decision Making Stable/Uncomplicated    Clinical Decision Making Low    Rehab Potential Fair    PT Frequency  2x / week    PT Duration  8 weeks    PT Treatment/Interventions Therapeutic activities;Therapeutic exercise;Functional mobility training;Balance training;Neuromuscular re-education;Patient/family education;Manual techniques;Dry needling;Aquatic Therapy;Electrical Stimulation;Iontophoresis 23m/ml Dexamethasone;Gait training;Stair training    PT Next Visit Plan Knee A/AROM, hip and knee strengthening, gait training, manual techniques, modalities PRN    Consulted and Agree with Plan of Care Patient                            MJoneen BoersPT, DPT  04/22/2022, 4:56 PM

## 2022-04-29 ENCOUNTER — Telehealth: Payer: Self-pay

## 2022-04-29 ENCOUNTER — Ambulatory Visit: Payer: No Typology Code available for payment source

## 2022-04-29 NOTE — Telephone Encounter (Signed)
No show. Called patient but unable to leave a message secondary to the mail box being full.

## 2022-05-04 ENCOUNTER — Ambulatory Visit (INDEPENDENT_AMBULATORY_CARE_PROVIDER_SITE_OTHER): Payer: Self-pay | Admitting: Orthopedic Surgery

## 2022-05-04 ENCOUNTER — Other Ambulatory Visit: Payer: Self-pay

## 2022-05-04 DIAGNOSIS — Z96652 Presence of left artificial knee joint: Secondary | ICD-10-CM

## 2022-05-04 DIAGNOSIS — M5440 Lumbago with sciatica, unspecified side: Secondary | ICD-10-CM

## 2022-05-04 DIAGNOSIS — M1711 Unilateral primary osteoarthritis, right knee: Secondary | ICD-10-CM

## 2022-05-04 MED ORDER — GABAPENTIN 100 MG PO CAPS
100.0000 mg | ORAL_CAPSULE | Freq: Two times a day (BID) | ORAL | 3 refills | Status: DC
  Filled 2022-05-04: qty 60, 30d supply, fill #0

## 2022-05-05 ENCOUNTER — Encounter: Payer: Self-pay | Admitting: Orthopedic Surgery

## 2022-05-05 ENCOUNTER — Ambulatory Visit: Payer: No Typology Code available for payment source | Attending: Orthopedic Surgery

## 2022-05-05 DIAGNOSIS — G8929 Other chronic pain: Secondary | ICD-10-CM

## 2022-05-05 DIAGNOSIS — M25662 Stiffness of left knee, not elsewhere classified: Secondary | ICD-10-CM

## 2022-05-05 DIAGNOSIS — M25562 Pain in left knee: Secondary | ICD-10-CM | POA: Insufficient documentation

## 2022-05-05 DIAGNOSIS — M6281 Muscle weakness (generalized): Secondary | ICD-10-CM

## 2022-05-05 DIAGNOSIS — R262 Difficulty in walking, not elsewhere classified: Secondary | ICD-10-CM

## 2022-05-05 NOTE — Progress Notes (Signed)
Office Visit Note   Patient: Leah Roberts           Date of Birth: Apr 21, 1951           MRN: [id number] Visit Date: 05/04/2022              Requested by: Burnard Hawthorne, FNP [address] [place],  [place] [address] PCP: Burnard Hawthorne, FNP  Chief Complaint  Patient presents with   Lower Back - Follow-up, Pain   Left Knee - Follow-up    Hx left TKA      HPI: Patient is a 72 year old woman who presents in follow-up for lower back pain and right knee pain, and stiffness and lateral pain over her left total knee arthroplasty.  She is 5 months out from total knee arthroplasty.  Patient states that prednisone and heat has resolved her radicular symptoms and she states that she has minimal lower back pain at this time.  She states she has pain over the lateral aspect of the left knee.  Also complains of pain with weightbearing on the right knee which is causing her to limp.  Assessment & Plan: Visit Diagnoses: No diagnosis found.  Plan: A refill prescription was called in for Neurontin 100 mg twice a day.  Recommended continue exercise and strengthening of the left knee  Follow-Up Instructions: Return if symptoms worsen or fail to improve.   Ortho Exam  Patient is alert, oriented, no adenopathy, well-dressed, normal affect, normal respiratory effort. Examination patient has good range of motion of the left knee from 0 to 120 degrees she is tender to palpation over the iliotibial band most likely secondary to hamstring weakness.  There is no redness no effusion of the left knee.  Right knee she is developing a flexion contracture of 10 degrees collaterals and cruciates are stable there is no effusion or cellulitis.  Patient has a negative sciatic tension sign no focal motor weakness.  Imaging: No results found. No images are attached to the encounter.  Labs: Lab Results  Component Value Date   HGBA1C 6.2 02/15/2018   HGBA1C 6.2 10/05/2017   HGBA1C 6.3  06/18/2016   ESRSEDRATE 10 05/29/2020   CRP <1.0 05/29/2020     Lab Results  Component Value Date   ALBUMIN 4.0 03/08/2022   ALBUMIN 3.7 01/13/2022   ALBUMIN 3.7 12/30/2021    Lab Results  Component Value Date   MG 2.0 01/04/2022   MG 2.0 01/03/2022   MG 1.8 01/02/2022   Lab Results  Component Value Date   VD25OH 21.43 (L) 10/05/2017    No results found for: "PREALBUMIN"    Latest Ref Rng & Units 03/08/2022    1:56 PM 01/13/2022   12:16 PM 01/04/2022    4:13 AM  CBC EXTENDED  WBC 4.0 - 10.5 K/uL 6.2  4.3  8.1   RBC 3.87 - 5.11 Mil/uL 4.77  3.82  3.51   Hemoglobin 12.0 - 15.0 g/dL 12.3  10.2  9.1   HCT 36.0 - 46.0 % 39.2  32.2  29.3   Platelets 150.0 - 400.0 K/uL 225.0  274.0  182   NEUT# 1.4 - 7.7 K/uL 2.8  2.1    Lymph# 0.7 - 4.0 K/uL 2.9  1.8       There is no height or weight on file to calculate BMI.  Orders:  No orders of the defined types were placed in this encounter.  Meds ordered this encounter  Medications  gabapentin (NEURONTIN) 100 MG capsule    Sig: Take 1 capsule (100 mg total) by mouth 2 (two) times daily. When necessary for neuropathy pain    Dispense:  60 capsule    Refill:  3     Procedures: No procedures performed  Clinical Data: No additional findings.  ROS:  All other systems negative, except as noted in the HPI. Review of Systems  Objective: Vital Signs: There were no vitals taken for this visit.  Specialty Comments:  No specialty comments available.  PMFS History: Patient Active Problem List   Diagnosis Date Noted   Right groin pain 01/13/2022   Bilateral pulmonary embolism with right heart strain RV/LV ratio 1:5 ([facility] [facility]) 12/31/2021   Acute respiratory failure with hypoxia ([facility]) 12/31/2021   Total knee replacement status, left 12/09/21 12/09/2021   Unilateral primary osteoarthritis, left knee    Shoulder disorder 09/03/2021   Arthritis of knee 09/03/2021   Lumbar radiculopathy 09/03/2021   Abnormal MRI, knee 09/03/2021    Chronic pain of right knee 09/03/2021   Bronchitis 03/25/2021   Anemia 01/09/2020   HTN (hypertension) 12/31/2019   Chest pain 12/31/2019   DOE (dyspnea on exertion) 12/31/2019   Acute coronary syndrome ([facility]) 12/31/2019   Unstable angina ([place]) 12/31/2019   Atherosclerosis 08/23/2018   Screening for tuberculosis 07/12/2018   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   Arthralgia 07/12/2018   Solitary pulmonary nodule 07/12/2018   Neck pain 02/15/2018   Dizziness 02/15/2018   Elevated TSH 02/15/2018   Screening for HIV (human immunodeficiency virus) 10/05/2017   Neuropathy 10/05/2017   Screening for colon cancer 10/05/2017   Hyperlipidemia 08/04/2016   Prediabetes 08/04/2016   Back pain 08/04/2016   Bilateral knee pain 06/18/2016   History of DVT of lower extremity 04/01/2014   History of DVT and PE (deep vein thrombosis) 05/04/2011   Past Medical History:  Diagnosis Date   Arthritis    DVT (deep venous thrombosis) ([facility]) 2013   Dyspnea    due to knee pain   Family history of adverse reaction to anesthesia    Brother and sister hard to wake up.   GERD (gastroesophageal reflux disease)    History of blood transfusion    History of hypertension    Hypertension    Peripheral vascular disease ([facility])    DVT   Pre-diabetes    Pulmonary embolism ([place] [facility]) 2013    Family History  Problem Relation Age of Onset   Hypertension Father    Heart disease Father 84   Thyroid disease Sister    Allergies Sister    CVA Neg Hx    Heart attack Neg Hx    High Cholesterol Neg Hx    Colon cancer Neg Hx     Past Surgical History:  Procedure Laterality Date   APPENDECTOMY     PULMONARY THROMBECTOMY Bilateral 01/01/2022   Procedure: PULMONARY THROMBECTOMY;  Surgeon: Katha Cabal, MD;  Location: [facility];  Service: Cardiovascular;  Laterality: Bilateral;   TONSILLECTOMY AND ADENOIDECTOMY     TOTAL KNEE ARTHROPLASTY Left 12/09/2021   Procedure: LEFT TOTAL KNEE ARTHROPLASTY;  Surgeon: Newt Minion, MD;  Location: [facility];  Service: Orthopedics;  Laterality: Left;   Social History   Occupational History   Not on file  Tobacco Use   Smoking status: Never   Smokeless tobacco: Never  Substance and Sexual Activity   Alcohol use: Yes    Comment: Ocassional glass of wine   Drug use: No  Sexual activity: Not on file

## 2022-05-05 NOTE — Therapy (Signed)
OUTPATIENT PHYSICAL THERAPY TREATMENT NOTE   Patient Name: Leah Roberts MRN: [id number] DOB:[birth year], 72 y.o., female Today's Date: 05/05/2022  PCP: Burnard Hawthorne, FNP REFERRING PROVIDER: Newt Minion, MD   PT End of Session - 05/05/22 1419     Visit Number 19    Number of Visits 33    Date for PT Re-Evaluation 06/03/22    Authorization Type 8    Authorization Time Period of 10 progress report    PT Start Time 1419    PT Stop Time 1458    PT Time Calculation (min) 39 min    Activity Tolerance Patient tolerated treatment well    Behavior During Therapy WFL for tasks assessed/performed                            Past Medical History:  Diagnosis Date   Arthritis    DVT (deep venous thrombosis) ([facility]) 2013   Dyspnea    due to knee pain   Family history of adverse reaction to anesthesia    Brother and sister hard to wake up.   GERD (gastroesophageal reflux disease)    History of blood transfusion    History of hypertension    Hypertension    Peripheral vascular disease ([facility])    DVT   Pre-diabetes    Pulmonary embolism ([facility]) 2013   Past Surgical History:  Procedure Laterality Date   APPENDECTOMY     PULMONARY THROMBECTOMY Bilateral 01/01/2022   Procedure: PULMONARY THROMBECTOMY;  Surgeon: Katha Cabal, MD;  Location: [facility];  Service: Cardiovascular;  Laterality: Bilateral;   TONSILLECTOMY AND ADENOIDECTOMY     TOTAL KNEE ARTHROPLASTY Left 12/09/2021   Procedure: LEFT TOTAL KNEE ARTHROPLASTY;  Surgeon: Newt Minion, MD;  Location: [facility];  Service: Orthopedics;  Laterality: Left;   Patient Active Problem List   Diagnosis Date Noted   Right groin pain 01/13/2022   Bilateral pulmonary embolism with right heart strain RV/LV ratio 1:5 ([facility]) 12/31/2021   Acute respiratory failure with hypoxia ([facility]) 12/31/2021   Total knee replacement status, left 12/09/21 12/09/2021   Unilateral primary osteoarthritis, left knee     Shoulder disorder 09/03/2021   Arthritis of knee 09/03/2021   Lumbar radiculopathy 09/03/2021   Abnormal MRI, knee 09/03/2021   Chronic pain of right knee 09/03/2021   Bronchitis 03/25/2021   Anemia 01/09/2020   HTN (hypertension) 12/31/2019   Chest pain 12/31/2019   DOE (dyspnea on exertion) 12/31/2019   Acute coronary syndrome ([place]) 12/31/2019   Unstable angina ([place]) 12/31/2019   Atherosclerosis 08/23/2018   Screening for tuberculosis 07/12/2018   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   Arthralgia 07/12/2018   Solitary pulmonary nodule 07/12/2018   Neck pain 02/15/2018   Dizziness 02/15/2018   Elevated TSH 02/15/2018   Screening for HIV (human immunodeficiency virus) 10/05/2017   Neuropathy 10/05/2017   Screening for colon cancer 10/05/2017   Hyperlipidemia 08/04/2016   Prediabetes 08/04/2016   Back pain 08/04/2016   Bilateral knee pain 06/18/2016   History of DVT of lower extremity 04/01/2014   History of DVT and PE (deep vein thrombosis) 05/04/2011    REFERRING DIAG: W09.811 (ICD-10-CM) - Status post total left   THERAPY DIAG:  Chronic pain of left knee  Difficulty in walking, not elsewhere classified  Stiffness of left knee, not elsewhere classified  Muscle weakness (generalized)  Rationale for Evaluation and Treatment Rehabilitation  PERTINENT HISTORY:  S/P L TKE 12/09/2021. The  DVT and PE prevented her from participating in much PT. Had home health PT for 4 weeks. Pt was using SPC R side prior to surgery. Heart doctor says her heart is fine (had an appointment yesterday).  PRECAUTIONS: no known precautions  SUBJECTIVE: started using her SPC again last week. L knee gets sore at night. Standing for too long makes her L knee sore. No pain currently. Feels heavy and stiff. Saw Dr. Sharol Given yesterday. He said that there is nothing that needs fixing.      PAIN:  Are you having pain? See subjective     TODAY'S TREATMENT:    Manual therapy   Seated STM L lateral knee  around distal insertion of IT band and anterior lateral leg to decrease fascial restrictions  Seated STM L rectus femoris and vastus lateralis and peroneal muscles to decrease tension   Seated STM L anterior knee to decrease fascial restrictions     Therapeutic exercise  Pt currently walking with SPC R side  Seated L knee flexion PROM with PT 10x    Seated L knee flexion AAROM with PT 10x3   82 degrees seated L knee flexion AROM after treatment   Seated L LE neural flossing 10x2   Forward step up onto first regular step with L LE with B UE assist 10x2  Side stepping 10 ft to the L and 10 ft to the R to promote glute med muscle strengthening.      Improved exercise technique, movement at target joints, use of target muscles after min to mod verbal, visual, tactile cues.       Response to treatment Pt tolerated session well without aggravation of symptoms.         Clinical impression    Able to maintain 82 degrees L knee flexion AROM. Demonstrates L knee flexion and extension AROM greater than non-surgical side observed. Continued working on L knee flexion AROM to decrease stiffness as well as L LE strengthening to improve ability to ambulate and negotiate stairs and curbs with less difficulty.  Pt will benefit from continued skilled physical therapy services to improve ROM, strength and function.     PATIENT EDUCATION: Education details: there-ex, HEP Person educated: Patient Education method: Explanation, Demonstration, Tactile cues, Verbal cues, and Handouts Education comprehension: verbalized understanding and returned demonstration   HOME EXERCISE PROGRAM: Access Code: [email or web address] URL: [email or web address] Date: 02/15/2022 Prepared by: Joneen Boers  Exercises - Seated Heel Slide  - 1 x daily - 7 x weekly - 3 sets - 10 reps - 5 seconds hold  Reclined   Hip extension isometrics, L leg straight 10x5 seconds for 3 sets  - Seated  Transversus Abdominis Bracing  - 5 x daily - 7 x weekly - 3 sets - 10 reps - 5 seconds hold   Seated trunk flexion stretch   PT Short Term Goals - 03/17/22 1720       PT SHORT TERM GOAL #1   Title Pt will be independent with her initial HEP to improve L knee AROM, strength, and ability to ambulate with less difficulty.    Baseline Doing her HEP.  Does not appear to have questions with the exercises (03/17/2022)    Time 3    Period Weeks    Status Achieved    Target Date 03/04/22              PT Long Term Goals - 04/22/22 1553       PT LONG TERM GOAL #1  Title Pt will be able to ambulate at least 500 ft without AD and no LOB to promote mobility.    Baseline Pt currenty ambulating with rw (02/11/2022); Able to ambulate 40 ft wiht SPC on R side prior to sitting rest break (03/17/2022); initially ambulated with [facility] but L knee pain increased . Currently back to rw to decrease pressure to L knee. (04/22/2022)    Time 8    Period Weeks    Status On-going    Target Date 06/03/22      PT LONG TERM GOAL #2   Title Pt will improve her L knee extension AROM to 0 degrees and knee flexion AROM to at least 115 degrees to promote ability to ambulate and negotiate stairs with less difficulty.    Baseline Seated L knee AROM: -15 degrees extension, 62 degrees flexion (02/11/2022); -12 degrees extension, 75 degrees flexion (03/17/2022); -9 degrees extension, 80 degrees flexino AROM (04/22/2022)    Time 8    Period Weeks    Status Partially Met    Target Date 06/03/22      PT LONG TERM GOAL #3   Title Pt will improve L knee flexion and extension strength by at least 1/2 MMT to promote ability to ambulate with less difficulty.    Baseline knee flexion 3+/5, extension 4-/5 (02/11/2022); knee flexion 3+/5, extension 5/5 (03/17/2022); knee flexion 4-/5, knee extension 5/5 (04/22/2022)    Time 8    Period Weeks    Status Partially Met    Target Date 06/03/22      PT LONG TERM GOAL #4   Title  Pt will improve her L knee FOTO score by at least 10 points as a demonstration of improved function.    Baseline L knee FOTO 36 (02/11/2022), (03/17/2022); 31.1 (04/22/2022)    Time 8    Period Weeks    Status On-going    Target Date 06/03/22              Plan - 05/05/22 1418     Clinical Impression Statement Able to maintain 82 degrees L knee flexion AROM. Demonstrates L knee flexion and extension AROM greater than non-surgical side observed. Continued working on L knee flexion AROM to decrease stiffness as well as L LE strengthening to improve ability to ambulate and negotiate stairs and curbs with less difficulty.  Pt will benefit from continued skilled physical therapy services to improve ROM, strength and function.    Personal Factors and Comorbidities Age;Comorbidity 3+;Fitness;Past/Current Experience;Time since onset of injury/illness/exacerbation    Comorbidities DVT, arthritis, dyspnea, HTN, PE    Examination-Activity Limitations Bathing;Stairs;Dressing;Stand;Bend;Lift;Transfers;Carry;Squat;Locomotion Level    Stability/Clinical Decision Making Stable/Uncomplicated    Clinical Decision Making Low    Rehab Potential Fair    PT Frequency 2x / week    PT Duration 8 weeks    PT Treatment/Interventions Therapeutic activities;Therapeutic exercise;Functional mobility training;Balance training;Neuromuscular re-education;Patient/family education;Manual techniques;Dry needling;Aquatic Therapy;Electrical Stimulation;Iontophoresis 40m/ml Dexamethasone;Gait training;Stair training    PT Next Visit Plan Knee A/AROM, hip and knee strengthening, gait training, manual techniques, modalities PRN    Consulted and Agree with Plan of Care Patient                             MJoneen BoersPT, DPT  05/05/2022, 3:59 PM

## 2022-05-10 ENCOUNTER — Ambulatory Visit: Payer: No Typology Code available for payment source

## 2022-05-10 DIAGNOSIS — M6281 Muscle weakness (generalized): Secondary | ICD-10-CM

## 2022-05-10 DIAGNOSIS — R262 Difficulty in walking, not elsewhere classified: Secondary | ICD-10-CM

## 2022-05-10 DIAGNOSIS — M25662 Stiffness of left knee, not elsewhere classified: Secondary | ICD-10-CM

## 2022-05-10 DIAGNOSIS — G8929 Other chronic pain: Secondary | ICD-10-CM

## 2022-05-10 NOTE — Therapy (Signed)
OUTPATIENT PHYSICAL THERAPY TREATMENT NOTE And Progress Report (03/17/2022 - 05/10/2022)   Patient Name: Leah Roberts MRN: [id number] DOB:May 13, 1950, 72 y.o., female Today's Date: 05/10/2022  PCP: Burnard Hawthorne, FNP REFERRING PROVIDER: Newt Minion, MD   PT End of Session - 05/10/22 1336     Visit Number 20    Number of Visits 33    Date for PT Re-Evaluation 06/03/22    Authorization Type 10    Authorization Time Period of 10 progress report    PT Start Time 1336    PT Stop Time 1416    PT Time Calculation (min) 40 min    Activity Tolerance Patient tolerated treatment well    Behavior During Therapy WFL for tasks assessed/performed                             Past Medical History:  Diagnosis Date   Arthritis    DVT (deep venous thrombosis) ([place]) 2013   Dyspnea    due to knee pain   Family history of adverse reaction to anesthesia    Brother and sister hard to wake up.   GERD (gastroesophageal reflux disease)    History of blood transfusion    History of hypertension    Hypertension    Peripheral vascular disease ([place])    DVT   Pre-diabetes    Pulmonary embolism ([place]) 2013   Past Surgical History:  Procedure Laterality Date   APPENDECTOMY     PULMONARY THROMBECTOMY Bilateral 01/01/2022   Procedure: PULMONARY THROMBECTOMY;  Surgeon: Katha Cabal, MD;  Location: [facility];  Service: Cardiovascular;  Laterality: Bilateral;   TONSILLECTOMY AND ADENOIDECTOMY     TOTAL KNEE ARTHROPLASTY Left 12/09/2021   Procedure: LEFT TOTAL KNEE ARTHROPLASTY;  Surgeon: Newt Minion, MD;  Location: [facility];  Service: Orthopedics;  Laterality: Left;   Patient Active Problem List   Diagnosis Date Noted   Right groin pain 01/13/2022   Bilateral pulmonary embolism with right heart strain RV/LV ratio 1:5 ([place]) 12/31/2021   Acute respiratory failure with hypoxia ([place]) 12/31/2021   Total knee replacement status, left 12/09/21 12/09/2021    Unilateral primary osteoarthritis, left knee    Shoulder disorder 09/03/2021   Arthritis of knee 09/03/2021   Lumbar radiculopathy 09/03/2021   Abnormal MRI, knee 09/03/2021   Chronic pain of right knee 09/03/2021   Bronchitis 03/25/2021   Anemia 01/09/2020   HTN (hypertension) 12/31/2019   Chest pain 12/31/2019   DOE (dyspnea on exertion) 12/31/2019   Acute coronary syndrome ([place]) 12/31/2019   Unstable angina ([place]) 12/31/2019   Atherosclerosis 08/23/2018   Screening for tuberculosis 07/12/2018   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   Arthralgia 07/12/2018   Solitary pulmonary nodule 07/12/2018   Neck pain 02/15/2018   Dizziness 02/15/2018   Elevated TSH 02/15/2018   Screening for HIV (human immunodeficiency virus) 10/05/2017   Neuropathy 10/05/2017   Screening for colon cancer 10/05/2017   Hyperlipidemia 08/04/2016   Prediabetes 08/04/2016   Back pain 08/04/2016   Bilateral knee pain 06/18/2016   History of DVT of lower extremity 04/01/2014   History of DVT and PE (deep vein thrombosis) 05/04/2011    REFERRING DIAG: W10.272 (ICD-10-CM) - Status post total left   THERAPY DIAG:  Chronic pain of left knee  Difficulty in walking, not elsewhere classified  Stiffness of left knee, not elsewhere classified  Muscle weakness (generalized)  Rationale for Evaluation and Treatment Rehabilitation  PERTINENT  HISTORY:  S/P L TKE 12/09/2021. The DVT and PE prevented her from participating in much PT. Had home health PT for 4 weeks. Pt was using SPC R side prior to surgery. Heart doctor says her heart is fine (had an appointment yesterday).  PRECAUTIONS: no known precautions  SUBJECTIVE:L knee is not too bad. 5/10 currently when walking.      PAIN:  Are you having pain? See subjective     TODAY'S TREATMENT:    Manual therapy  Seated STM L lateral hamstrings to decrease tension   -10 degrees seated L knee extension AROM afterwards  Seated STM L lateral knee around distal insertion  of IT band and anterior lateral leg to decrease fascial restrictions  Seated STM L rectus femoris and vastus lateralis and peroneal muscles to decrease tension   Seated STM L anterior knee to decrease fascial restrictions     Therapeutic exercise  Pt currently walking with SPC R side  Seated L knee AROM -15 degrees extension, 83 degrees knee flexion at start of session.   Seated manually resisted L knee flexion and extension 1x each way   Seated L knee flexion PROM with PT 10x    Seated L knee flexion AAROM with PT 10x3   88 degrees seated L knee flexion AROM after treatment  Seated L LE neural flossing 10x2  Sit <> stand, B UE assist to stand, min UE assist to sit.5x  Forward step up onto first regular step with L LE with B UE assist 10x2     Improved exercise technique, movement at target joints, use of target muscles after min to mod verbal, visual, tactile cues.       Response to treatment Pt tolerated session well without aggravation of symptoms.         Clinical impression   Pt demonstrates overall improving L knee flexion and extension AROM, strength, ability to ambulate and function since initial evaluation. Pt able to achieve 88 degrees seated L knee flexion AROM after treatment today. Pt now currently able to ambulate with a SPC consistently.  Pt will benefit from continued skilled physical therapy services to continue to improve ROM, strength and function.     PATIENT EDUCATION: Education details: there-ex, HEP Person educated: Patient Education method: Explanation, Demonstration, Tactile cues, Verbal cues, and Handouts Education comprehension: verbalized understanding and returned demonstration   HOME EXERCISE PROGRAM: Access Code: [email or web address] URL: [email or web address] Date: 02/15/2022 Prepared by: Loralyn Freshwater  Exercises - Seated Heel Slide  - 1 x daily - 7 x weekly - 3 sets - 10 reps - 5 seconds hold  Reclined   Hip  extension isometrics, L leg straight 10x5 seconds for 3 sets  - Seated Transversus Abdominis Bracing  - 5 x daily - 7 x weekly - 3 sets - 10 reps - 5 seconds hold   Seated trunk flexion stretch   PT Short Term Goals - 03/17/22 1720       PT SHORT TERM GOAL #1   Title Pt will be independent with her initial HEP to improve L knee AROM, strength, and ability to ambulate with less difficulty.    Baseline Doing her HEP.  Does not appear to have questions with the exercises (03/17/2022)    Time 3    Period Weeks    Status Achieved    Target Date 03/04/22              PT Long Term Goals - 05/10/22 1340  PT LONG TERM GOAL #1   Title Pt will be able to ambulate at least 500 ft without AD and no LOB to promote mobility.    Baseline Pt currenty ambulating with rw (02/11/2022); Able to ambulate 40 ft wiht SPC on R side prior to sitting rest break (03/17/2022); initially ambulated with [facility] but L knee pain increased . Currently back to rw to decrease pressure to L knee. (04/22/2022); Pt currently ambulating with SPC (05/10/2022)    Time 8    Period Weeks    Status Partially Met    Target Date 06/03/22      PT LONG TERM GOAL #2   Title Pt will improve her L knee extension AROM to 0 degrees and knee flexion AROM to at least 115 degrees to promote ability to ambulate and negotiate stairs with less difficulty.    Baseline Seated L knee AROM: -15 degrees extension, 62 degrees flexion (02/11/2022); -12 degrees extension, 75 degrees flexion (03/17/2022); -9 degrees extension, 80 degrees flexino AROM (04/22/2022); -10 degrees knee extension, 88 degrees knee flexion AROM (after treatment) (05/10/2022)    Time 8    Period Weeks    Status Partially Met    Target Date 06/03/22      PT LONG TERM GOAL #3   Title Pt will improve L knee flexion and extension strength by at least 1/2 MMT to promote ability to ambulate with less difficulty.    Baseline knee flexion 3+/5, extension 4-/5 (02/11/2022);  knee flexion 3+/5, extension 5/5 (03/17/2022); knee flexion 4-/5, knee extension 5/5 (04/22/2022); 4/5 flexion, 5/5 knee extension (05/10/2022)    Time 8    Period Weeks    Status Achieved    Target Date 06/03/22      PT LONG TERM GOAL #4   Title Pt will improve her L knee FOTO score by at least 10 points as a demonstration of improved function.    Baseline L knee FOTO 36 (02/11/2022), (03/17/2022); 31.1 (04/22/2022); 44 (05/10/2022)    Time 8    Period Weeks    Status On-going    Target Date 06/03/22              Plan - 05/10/22 1333     Clinical Impression Statement Pt demonstrates overall improving L knee flexion and extension AROM, strength, ability to ambulate and function since initial evaluation. Pt able to achieve 88 degrees seated L knee flexion AROM after treatment today. Pt now currently able to ambulate with a SPC consistently.  Pt will benefit from continued skilled physical therapy services to continue to improve ROM, strength and function.    Personal Factors and Comorbidities Age;Comorbidity 3+;Fitness;Past/Current Experience;Time since onset of injury/illness/exacerbation    Comorbidities DVT, arthritis, dyspnea, HTN, PE    Examination-Activity Limitations Bathing;Stairs;Dressing;Stand;Bend;Lift;Transfers;Carry;Squat;Locomotion Level    Stability/Clinical Decision Making Stable/Uncomplicated    Clinical Decision Making Low    Rehab Potential Fair    PT Frequency 2x / week    PT Duration 8 weeks    PT Treatment/Interventions Therapeutic activities;Therapeutic exercise;Functional mobility training;Balance training;Neuromuscular re-education;Patient/family education;Manual techniques;Dry needling;Aquatic Therapy;Electrical Stimulation;Iontophoresis 4mg /ml Dexamethasone;Gait training;Stair training    PT Next Visit Plan Knee A/AROM, hip and knee strengthening, gait training, manual techniques, modalities PRN    Consulted and Agree with Plan of Care Patient                           Thank you for your referral.    PT,  DPT  05/10/2022, 2:39 PM

## 2022-05-12 ENCOUNTER — Ambulatory Visit: Payer: No Typology Code available for payment source

## 2022-05-12 DIAGNOSIS — M25662 Stiffness of left knee, not elsewhere classified: Secondary | ICD-10-CM

## 2022-05-12 DIAGNOSIS — R262 Difficulty in walking, not elsewhere classified: Secondary | ICD-10-CM

## 2022-05-12 DIAGNOSIS — G8929 Other chronic pain: Secondary | ICD-10-CM

## 2022-05-12 DIAGNOSIS — M6281 Muscle weakness (generalized): Secondary | ICD-10-CM

## 2022-05-12 NOTE — Therapy (Signed)
OUTPATIENT PHYSICAL THERAPY TREATMENT NOTE     Patient Name: Leah Roberts MRN: [id number] DOB:1951-04-23, 72 y.o., female Today's Date: 05/12/2022  PCP: Burnard Hawthorne, FNP REFERRING PROVIDER: Newt Minion, MD   PT End of Session - 05/12/22 1417     Visit Number 21    Number of Visits 33    Date for PT Re-Evaluation 06/03/22    Authorization Type 10    Authorization Time Period of 10 progress report    PT Start Time 1418    PT Stop Time 1458    PT Time Calculation (min) 40 min    Activity Tolerance Patient tolerated treatment well    Behavior During Therapy WFL for tasks assessed/performed                              Past Medical History:  Diagnosis Date   Arthritis    DVT (deep venous thrombosis) ([place]) 2013   Dyspnea    due to knee pain   Family history of adverse reaction to anesthesia    Brother and sister hard to wake up.   GERD (gastroesophageal reflux disease)    History of blood transfusion    History of hypertension    Hypertension    Peripheral vascular disease ([place])    DVT   Pre-diabetes    Pulmonary embolism ([place]) 2013   Past Surgical History:  Procedure Laterality Date   APPENDECTOMY     PULMONARY THROMBECTOMY Bilateral 01/01/2022   Procedure: PULMONARY THROMBECTOMY;  Surgeon: Katha Cabal, MD;  Location: [facility];  Service: Cardiovascular;  Laterality: Bilateral;   TONSILLECTOMY AND ADENOIDECTOMY     TOTAL KNEE ARTHROPLASTY Left 12/09/2021   Procedure: LEFT TOTAL KNEE ARTHROPLASTY;  Surgeon: Newt Minion, MD;  Location: [facility];  Service: Orthopedics;  Laterality: Left;   Patient Active Problem List   Diagnosis Date Noted   Right groin pain 01/13/2022   Bilateral pulmonary embolism with right heart strain RV/LV ratio 1:5 ([place]) 12/31/2021   Acute respiratory failure with hypoxia ([place]) 12/31/2021   Total knee replacement status, left 12/09/21 12/09/2021   Unilateral primary osteoarthritis, left knee     Shoulder disorder 09/03/2021   Arthritis of knee 09/03/2021   Lumbar radiculopathy 09/03/2021   Abnormal MRI, knee 09/03/2021   Chronic pain of right knee 09/03/2021   Bronchitis 03/25/2021   Anemia 01/09/2020   HTN (hypertension) 12/31/2019   Chest pain 12/31/2019   DOE (dyspnea on exertion) 12/31/2019   Acute coronary syndrome ([place]) 12/31/2019   Unstable angina ([place]) 12/31/2019   Atherosclerosis 08/23/2018   Screening for tuberculosis 07/12/2018   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   Arthralgia 07/12/2018   Solitary pulmonary nodule 07/12/2018   Neck pain 02/15/2018   Dizziness 02/15/2018   Elevated TSH 02/15/2018   Screening for HIV (human immunodeficiency virus) 10/05/2017   Neuropathy 10/05/2017   Screening for colon cancer 10/05/2017   Hyperlipidemia 08/04/2016   Prediabetes 08/04/2016   Back pain 08/04/2016   Bilateral knee pain 06/18/2016   History of DVT of lower extremity 04/01/2014   History of DVT and PE (deep vein thrombosis) 05/04/2011    REFERRING DIAG: I34.742 (ICD-10-CM) - Status post total left   THERAPY DIAG:  Chronic pain of left knee  Difficulty in walking, not elsewhere classified  Stiffness of left knee, not elsewhere classified  Muscle weakness (generalized)  Rationale for Evaluation and Treatment Rehabilitation  PERTINENT HISTORY:  S/P  L TKE 12/09/2021. The DVT and PE prevented her from participating in much PT. Had home health PT for 4 weeks. Pt was using SPC R side prior to surgery. Heart doctor says her heart is fine (had an appointment yesterday).  PRECAUTIONS: no known precautions  SUBJECTIVE:L knee is good. Sometimes gives her trouble at night. 4/10 L knee stiffness currently.      PAIN:  Are you having pain? See subjective     TODAY'S TREATMENT:    Manual therapy  Seated STM L lateral hamstrings to decrease tension    Seated STM L lateral knee around distal insertion of IT band and anterior lateral leg to decrease fascial  restrictions  Seated STM L rectus femoris and vastus lateralis and peroneal muscles to decrease tension   Seated STM L anterior knee to decrease fascial restrictions     Therapeutic exercise  Pt currently walking with SPC R side  Seated L LE neural flossing 10x2  Seated L knee flexion PROM with PT 10x    Seated L knee flexion AAROM with PT 10x3   85 degrees seated L knee flexion AROM after treatment  Sit <> stand, B UE assist to stand, min UE assist to sit.5x2  Difficulty secondary to L knee stiffness   Forward step up onto first regular step with L LE with R UE assist 10x2     Improved exercise technique, movement at target joints, use of target muscles after min to mod verbal, visual, tactile cues.       Response to treatment Pt tolerated session well without aggravation of symptoms.         Clinical impression  Continued working on improving L knee extension and flexion AROM secondary to stiffness. Continued working on improving L quadriceps strength to promote ability to ambulate with less difficulty. Pt tolerated session well without aggravation of symptoms.  Pt will benefit from continued skilled physical therapy services to continue to improve ROM, strength and function.     PATIENT EDUCATION: Education details: there-ex, HEP Person educated: Patient Education method: Explanation, Demonstration, Tactile cues, Verbal cues, and Handouts Education comprehension: verbalized understanding and returned demonstration   HOME EXERCISE PROGRAM: Access Code: [email or web address] URL: [email or web address] Date: 02/15/2022 Prepared by: Joneen Boers  Exercises - Seated Heel Slide  - 1 x daily - 7 x weekly - 3 sets - 10 reps - 5 seconds hold  Reclined   Hip extension isometrics, L leg straight 10x5 seconds for 3 sets  - Seated Transversus Abdominis Bracing  - 5 x daily - 7 x weekly - 3 sets - 10 reps - 5 seconds hold   Seated trunk flexion stretch    PT Short Term Goals - 03/17/22 1720       PT SHORT TERM GOAL #1   Title Pt will be independent with her initial HEP to improve L knee AROM, strength, and ability to ambulate with less difficulty.    Baseline Doing her HEP.  Does not appear to have questions with the exercises (03/17/2022)    Time 3    Period Weeks    Status Achieved    Target Date 03/04/22              PT Long Term Goals - 05/10/22 1340       PT LONG TERM GOAL #1   Title Pt will be able to ambulate at least 500 ft without AD and no LOB to promote mobility.    Baseline Pt currenty ambulating with  rw (02/11/2022); Able to ambulate 40 ft wiht SPC on R side prior to sitting rest break (03/17/2022); initially ambulated with [facility] but L knee pain increased . Currently back to rw to decrease pressure to L knee. (04/22/2022); Pt currently ambulating with SPC (05/10/2022)    Time 8    Period Weeks    Status Partially Met    Target Date 06/03/22      PT LONG TERM GOAL #2   Title Pt will improve her L knee extension AROM to 0 degrees and knee flexion AROM to at least 115 degrees to promote ability to ambulate and negotiate stairs with less difficulty.    Baseline Seated L knee AROM: -15 degrees extension, 62 degrees flexion (02/11/2022); -12 degrees extension, 75 degrees flexion (03/17/2022); -9 degrees extension, 80 degrees flexino AROM (04/22/2022); -10 degrees knee extension, 88 degrees knee flexion AROM (after treatment) (05/10/2022)    Time 8    Period Weeks    Status Partially Met    Target Date 06/03/22      PT LONG TERM GOAL #3   Title Pt will improve L knee flexion and extension strength by at least 1/2 MMT to promote ability to ambulate with less difficulty.    Baseline knee flexion 3+/5, extension 4-/5 (02/11/2022); knee flexion 3+/5, extension 5/5 (03/17/2022); knee flexion 4-/5, knee extension 5/5 (04/22/2022); 4/5 flexion, 5/5 knee extension (05/10/2022)    Time 8    Period Weeks    Status Achieved    Target Date  06/03/22      PT LONG TERM GOAL #4   Title Pt will improve her L knee FOTO score by at least 10 points as a demonstration of improved function.    Baseline L knee FOTO 36 (02/11/2022), (03/17/2022); 31.1 (04/22/2022); 44 (05/10/2022)    Time 8    Period Weeks    Status On-going    Target Date 06/03/22              Plan - 05/12/22 1417     Clinical Impression Statement Continued working on improving L knee extension and flexion AROM secondary to stiffness. Continued working on improving L quadriceps strength to promote ability to ambulate with less difficulty. Pt tolerated session well without aggravation of symptoms.  Pt will benefit from continued skilled physical therapy services to continue to improve ROM, strength and function.    Personal Factors and Comorbidities Age;Comorbidity 3+;Fitness;Past/Current Experience;Time since onset of injury/illness/exacerbation    Comorbidities DVT, arthritis, dyspnea, HTN, PE    Examination-Activity Limitations Bathing;Stairs;Dressing;Stand;Bend;Lift;Transfers;Carry;Squat;Locomotion Level    Stability/Clinical Decision Making Stable/Uncomplicated    Clinical Decision Making Low    Rehab Potential Fair    PT Frequency 2x / week    PT Duration 8 weeks    PT Treatment/Interventions Therapeutic activities;Therapeutic exercise;Functional mobility training;Balance training;Neuromuscular re-education;Patient/family education;Manual techniques;Dry needling;Aquatic Therapy;Electrical Stimulation;Iontophoresis 4mg /ml Dexamethasone;Gait training;Stair training    PT Next Visit Plan Knee A/AROM, hip and knee strengthening, gait training, manual techniques, modalities PRN    Consulted and Agree with Plan of Care Patient                          PT, DPT  05/12/2022, 3:03 PM

## 2022-05-17 ENCOUNTER — Ambulatory Visit: Payer: No Typology Code available for payment source

## 2022-05-17 DIAGNOSIS — G8929 Other chronic pain: Secondary | ICD-10-CM

## 2022-05-17 DIAGNOSIS — M6281 Muscle weakness (generalized): Secondary | ICD-10-CM

## 2022-05-17 DIAGNOSIS — M25662 Stiffness of left knee, not elsewhere classified: Secondary | ICD-10-CM

## 2022-05-17 DIAGNOSIS — R262 Difficulty in walking, not elsewhere classified: Secondary | ICD-10-CM

## 2022-05-17 NOTE — Therapy (Signed)
OUTPATIENT PHYSICAL THERAPY TREATMENT NOTE     Patient Name: Leah Roberts MRN: [id number] DOB:12-Aug-1950, 72 y.o., female Today's Date: 05/17/2022  PCP: Allegra Grana, FNP REFERRING PROVIDER: Nadara Mustard, MD   PT End of Session - 05/17/22 1421     Visit Number 22    Number of Visits 33    Date for PT Re-Evaluation 06/03/22    Authorization Type 10    Authorization Time Period of 10 progress report    PT Start Time 1421    PT Stop Time 1502    PT Time Calculation (min) 41 min    Activity Tolerance Patient tolerated treatment well    Behavior During Therapy WFL for tasks assessed/performed                               Past Medical History:  Diagnosis Date   Arthritis    DVT (deep venous thrombosis) (HCC) 2013   Dyspnea    due to knee pain   Family history of adverse reaction to anesthesia    Brother and sister hard to wake up.   GERD (gastroesophageal reflux disease)    History of blood transfusion    History of hypertension    Hypertension    Peripheral vascular disease (HCC)    DVT   Pre-diabetes    Pulmonary embolism (HCC) 2013   Past Surgical History:  Procedure Laterality Date   APPENDECTOMY     PULMONARY THROMBECTOMY Bilateral 01/01/2022   Procedure: PULMONARY THROMBECTOMY;  Surgeon: Renford Dills, MD;  Location: [facility];  Service: Cardiovascular;  Laterality: Bilateral;   TONSILLECTOMY AND ADENOIDECTOMY     TOTAL KNEE ARTHROPLASTY Left 12/09/2021   Procedure: LEFT TOTAL KNEE ARTHROPLASTY;  Surgeon: Nadara Mustard, MD;  Location: [facility] OR;  Service: Orthopedics;  Laterality: Left;   Patient Active Problem List   Diagnosis Date Noted   Right groin pain 01/13/2022   Bilateral pulmonary embolism with right heart strain RV/LV ratio 1:5 (HCC) 12/31/2021   Acute respiratory failure with hypoxia (HCC) 12/31/2021   Total knee replacement status, left 12/09/21 12/09/2021   Unilateral primary osteoarthritis, left knee     Shoulder disorder 09/03/2021   Arthritis of knee 09/03/2021   Lumbar radiculopathy 09/03/2021   Abnormal MRI, knee 09/03/2021   Chronic pain of right knee 09/03/2021   Bronchitis 03/25/2021   Anemia 01/09/2020   HTN (hypertension) 12/31/2019   Chest pain 12/31/2019   DOE (dyspnea on exertion) 12/31/2019   Acute coronary syndrome (HCC) 12/31/2019   Unstable angina (HCC) 12/31/2019   Atherosclerosis 08/23/2018   Screening for tuberculosis 07/12/2018   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   Arthralgia 07/12/2018   Solitary pulmonary nodule 07/12/2018   Neck pain 02/15/2018   Dizziness 02/15/2018   Elevated TSH 02/15/2018   Screening for HIV (human immunodeficiency virus) 10/05/2017   Neuropathy 10/05/2017   Screening for colon cancer 10/05/2017   Hyperlipidemia 08/04/2016   Prediabetes 08/04/2016   Back pain 08/04/2016   Bilateral knee pain 06/18/2016   History of DVT of lower extremity 04/01/2014   History of DVT and PE (deep vein thrombosis) 05/04/2011    REFERRING DIAG: U23.536 (ICD-10-CM) - Status post total left   THERAPY DIAG:  Chronic pain of left knee  Difficulty in walking, not elsewhere classified  Stiffness of left knee, not elsewhere classified  Muscle weakness (generalized)  Rationale for Evaluation and Treatment Rehabilitation  PERTINENT HISTORY:  S/P L TKE 12/09/2021. The DVT and PE prevented her from participating in much PT. Had home health PT for 4 weeks. Pt was using SPC R side prior to surgery. Heart doctor says her heart is fine (had an appointment yesterday).  PRECAUTIONS: no known precautions  SUBJECTIVE:L knee feels heavy. Not really any pain.      PAIN:  Are you having pain? See subjective     TODAY'S TREATMENT:    Manual therapy   Seated STM L lateral knee around distal insertion of IT band and anterior lateral leg to decrease fascial restrictions  Seated STM L rectus femoris and vastus lateralis and peroneal muscles to decrease tension    Seated STM L anterior knee to decrease fascial restrictions     Therapeutic exercise  Pt currently walking with SPC R side   Standing hip flexor stretch with B UE assist   L 30 seconds x 3   Slight decrease in L leg heaviness feeling when walking  Seated trunk flexion stretch 30 seconds for 3 sets  Seated L knee flexion PROM with PT 10x    Seated L knee flexion AAROM with PT 10x3   88 degrees seated L knee flexion AROM after treatment   Sit <> stand, B UE assist to stand, min UE assist to sit.5x2  Difficulty secondary to L knee stiffness     Improved exercise technique, movement at target joints, use of target muscles after min to mod verbal, visual, tactile cues.       Response to treatment Pt tolerated session well without aggravation of symptoms.  Improved L knee comfort level and decrease heaviness reported during gait after session.         Clinical impression   Pt able to achieve up to 88 degrees seated L knee flexion AROM today after treatment for the second session in the past 3 appointments. Slowly but steadily improving L knee flexion AROM. Decreased tightness and feeling of L knee heaviness reported after session during gait. Pt will benefit from continued skilled physical therapy services to continue to improve ROM, strength and function.     PATIENT EDUCATION: Education details: there-ex, HEP Person educated: Patient Education method: Explanation, Demonstration, Tactile cues, Verbal cues, and Handouts Education comprehension: verbalized understanding and returned demonstration   HOME EXERCISE PROGRAM: Access Code: [email or web address] URL: [email or web address] Date: 02/15/2022 Prepared by: Joneen Boers  Exercises - Seated Heel Slide  - 1 x daily - 7 x weekly - 3 sets - 10 reps - 5 seconds hold  Reclined   Hip extension isometrics, L leg straight 10x5 seconds for 3 sets  - Seated Transversus Abdominis Bracing  - 5 x daily - 7 x  weekly - 3 sets - 10 reps - 5 seconds hold   Seated trunk flexion stretch   PT Short Term Goals - 03/17/22 1720       PT SHORT TERM GOAL #1   Title Pt will be independent with her initial HEP to improve L knee AROM, strength, and ability to ambulate with less difficulty.    Baseline Doing her HEP.  Does not appear to have questions with the exercises (03/17/2022)    Time 3    Period Weeks    Status Achieved    Target Date 03/04/22              PT Long Term Goals - 05/10/22 1340       PT LONG TERM GOAL #1   Title Pt will be  able to ambulate at least 500 ft without AD and no LOB to promote mobility.    Baseline Pt currenty ambulating with rw (02/11/2022); Able to ambulate 40 ft wiht SPC on R side prior to sitting rest break (03/17/2022); initially ambulated with [facility] but L knee pain increased . Currently back to rw to decrease pressure to L knee. (04/22/2022); Pt currently ambulating with SPC (05/10/2022)    Time 8    Period Weeks    Status Partially Met    Target Date 06/03/22      PT LONG TERM GOAL #2   Title Pt will improve her L knee extension AROM to 0 degrees and knee flexion AROM to at least 115 degrees to promote ability to ambulate and negotiate stairs with less difficulty.    Baseline Seated L knee AROM: -15 degrees extension, 62 degrees flexion (02/11/2022); -12 degrees extension, 75 degrees flexion (03/17/2022); -9 degrees extension, 80 degrees flexino AROM (04/22/2022); -10 degrees knee extension, 88 degrees knee flexion AROM (after treatment) (05/10/2022)    Time 8    Period Weeks    Status Partially Met    Target Date 06/03/22      PT LONG TERM GOAL #3   Title Pt will improve L knee flexion and extension strength by at least 1/2 MMT to promote ability to ambulate with less difficulty.    Baseline knee flexion 3+/5, extension 4-/5 (02/11/2022); knee flexion 3+/5, extension 5/5 (03/17/2022); knee flexion 4-/5, knee extension 5/5 (04/22/2022); 4/5 flexion, 5/5 knee  extension (05/10/2022)    Time 8    Period Weeks    Status Achieved    Target Date 06/03/22      PT LONG TERM GOAL #4   Title Pt will improve her L knee FOTO score by at least 10 points as a demonstration of improved function.    Baseline L knee FOTO 36 (02/11/2022), (03/17/2022); 31.1 (04/22/2022); 44 (05/10/2022)    Time 8    Period Weeks    Status On-going    Target Date 06/03/22              Plan - 05/17/22 1421     Clinical Impression Statement Pt able to achieve up to 88 degrees seated L knee flexion AROM today after treatment for the second session in the past 3 appointments. Slowly but steadily improving L knee flexion AROM. Decreased tightness and feeling of L knee heaviness reported after session during gait. Pt will benefit from continued skilled physical therapy services to continue to improve ROM, strength and function.    Personal Factors and Comorbidities Age;Comorbidity 3+;Fitness;Past/Current Experience;Time since onset of injury/illness/exacerbation    Comorbidities DVT, arthritis, dyspnea, HTN, PE    Examination-Activity Limitations Bathing;Stairs;Dressing;Stand;Bend;Lift;Transfers;Carry;Squat;Locomotion Level    Stability/Clinical Decision Making Stable/Uncomplicated    Rehab Potential Fair    PT Frequency 2x / week    PT Duration 8 weeks    PT Treatment/Interventions Therapeutic activities;Therapeutic exercise;Functional mobility training;Balance training;Neuromuscular re-education;Patient/family education;Manual techniques;Dry needling;Aquatic Therapy;Electrical Stimulation;Iontophoresis 4mg /ml Dexamethasone;Gait training;Stair training    PT Next Visit Plan Knee A/AROM, hip and knee strengthening, gait training, manual techniques, modalities PRN    Consulted and Agree with Plan of Care Patient                           Joneen Boers PT, DPT  05/17/2022, 3:11 PM

## 2022-05-19 ENCOUNTER — Other Ambulatory Visit: Payer: Self-pay

## 2022-05-19 ENCOUNTER — Ambulatory Visit (INDEPENDENT_AMBULATORY_CARE_PROVIDER_SITE_OTHER): Payer: No Typology Code available for payment source | Admitting: Family

## 2022-05-19 ENCOUNTER — Encounter: Payer: Self-pay | Admitting: Family

## 2022-05-19 VITALS — BP 126/82 | HR 71 | Temp 97.5°F | Ht 66.0 in | Wt 229.0 lb

## 2022-05-19 DIAGNOSIS — M1710 Unilateral primary osteoarthritis, unspecified knee: Secondary | ICD-10-CM

## 2022-05-19 DIAGNOSIS — Z780 Asymptomatic menopausal state: Secondary | ICD-10-CM

## 2022-05-19 DIAGNOSIS — M25561 Pain in right knee: Secondary | ICD-10-CM

## 2022-05-19 DIAGNOSIS — G8929 Other chronic pain: Secondary | ICD-10-CM

## 2022-05-19 DIAGNOSIS — M25562 Pain in left knee: Secondary | ICD-10-CM

## 2022-05-19 DIAGNOSIS — Z87898 Personal history of other specified conditions: Secondary | ICD-10-CM

## 2022-05-19 DIAGNOSIS — I7090 Unspecified atherosclerosis: Secondary | ICD-10-CM

## 2022-05-19 DIAGNOSIS — I2699 Other pulmonary embolism without acute cor pulmonale: Secondary | ICD-10-CM

## 2022-05-19 DIAGNOSIS — Z1231 Encounter for screening mammogram for malignant neoplasm of breast: Secondary | ICD-10-CM

## 2022-05-19 MED ORDER — TRAMADOL HCL 50 MG PO TABS
50.0000 mg | ORAL_TABLET | Freq: Two times a day (BID) | ORAL | 1 refills | Status: AC | PRN
  Filled 2022-05-19: qty 30, 15d supply, fill #0

## 2022-05-19 NOTE — Assessment & Plan Note (Signed)
She is compliant with Eliquis 5 mg twice daily.  Continue lifelong.

## 2022-05-19 NOTE — Patient Instructions (Signed)
Please let me know if vaginal bleeding persists   please call  and schedule your 3D mammogram and /or bone density scan as we discussed.   [facility]  ( new location in 2023)  [address], [place], [place] [address]  [place], [place]  [phone]   I have ordered transvaginal ultrasound.  Let us know if you dont hear back within a week in regards to an appointment being scheduled.   So that you are aware, if you are [facility] MyChart user , please pay attention to your MyChart messages as you may receive a MyChart message with a phone number to call and schedule this test/appointment own your own from our referral coordinator. This is a new process so I do not want you to miss this message.  If you are not a MyChart user, you will receive a phone call.   

## 2022-05-19 NOTE — Progress Notes (Signed)
Assessment & Plan:  Arthritis of knee -     traMADol HCl; Take 1 tablet (50 mg total) by mouth 2 (two) times daily as needed for up to 5 days.  Dispense: 30 tablet; Refill: 1  Bilateral pulmonary embolism with right heart strain RV/LV ratio 1:5 (HCC) Assessment & Plan: She is compliant with Eliquis 5 mg twice daily.  Continue lifelong.  Orders: -     CBC with Differential/Platelet  Atherosclerosis -     Hemoglobin A1c -     Lipid panel  History of prediabetes -     Hemoglobin A1c  Encounter for screening mammogram for malignant neoplasm of breast -     3D Screening Mammogram, Left and Right; Future  Asymptomatic postmenopausal state -     DG Bone Density; Future  Chronic pain of both knees Assessment & Plan: H/o Left total knee arthroplasty 12/2021 Significant knee improvement.  Patient continues to participate in physical therapy.  She feels better with tramadol 50 mg twice daily as needed which she has taken and tolerated well in the past.  She understands not to take NSAIDs.  She will let me know how she is doing.      Return precautions given.   Risks, benefits, and alternatives of the medications and treatment plan prescribed today were discussed, and patient expressed understanding.   Education regarding symptom management and diagnosis given to patient on AVS either electronically or printed.  No follow-ups on file.  Leah Paris, FNP  Subjective:    Patient ID: Leah Roberts, female    DOB: 1951/04/29, 72 y.o.   MRN: [id number]  CC: Leah Roberts is a 72 y.o. female who presents today for follow up.   HPI: Left knee pain has significantly improved. She does complain of stiffness around left lateral knee, bothersome when walking. Trouble bending knee fully.  Icing makes worse. Heat is helpful.  She is not taking nsaids.  Gabapentin has not been helpful.  She takes tylenol 500mg  with some relief.  Previously been on tramadol with  improvement.        No h/o seizure.  Rare alcohol use.  She is no longer on oxycodone.  Following with Dr Sharol Given was started gabapentin 100mg  bid.  H/o Left total knee arthroplasty 12/2021 Patient had follow-up with Dr Charlestine Night 03/09/2022 for aortic atherosclerosis, hyperlipidemia, history of DVT, PE. She is compliant with Eliquis 5mg  BID understands this to be lifelong.   Allergies: Sulfa antibiotics Current Outpatient Medications on File Prior to Visit  Medication Sig Dispense Refill   apixaban (ELIQUIS) 5 MG TABS tablet Take 1 tablet (5 mg total) by mouth 2 (two) times daily. 180 tablet 1   calcium carbonate (TUMS - DOSED IN MG ELEMENTAL CALCIUM) 500 MG chewable tablet Chew 1 tablet by mouth daily as needed for indigestion or heartburn.     ferrous sulfate 325 (65 FE) MG tablet Take 1 tablet (325 mg total) by mouth 2 (two) times daily with a meal. 60 tablet 1   loratadine (CLARITIN) 10 MG tablet Take 10 mg by mouth daily.     Menthol, Topical Analgesic, (BIOFREEZE EX) Apply 1 Application topically daily as needed (leg pain).     Multiple Vitamin (MULTIVITAMIN WITH MINERALS) TABS tablet Take 1 tablet by mouth 2 (two) times a week.     rosuvastatin (CRESTOR) 5 MG tablet Take 1 tablet (5 mg total) by mouth at bedtime. 90 tablet 3   No current facility-administered medications on file prior to  visit.    Review of Systems  Constitutional:  Negative for chills and fever.  Respiratory:  Negative for cough.   Cardiovascular:  Negative for chest pain, palpitations and leg swelling.  Gastrointestinal:  Negative for nausea and vomiting.  Musculoskeletal:  Positive for arthralgias (left  knee).      Objective:    BP 126/82   Pulse 71   Temp (!) 97.5 F (36.4 C)   Ht 5\' 6"  (1.676 m)   Wt 229 lb (103.9 kg)   SpO2 99%   BMI 36.96 kg/m  BP Readings from Last 3 Encounters:  05/19/22 126/82  03/09/22 134/87  01/13/22 120/78   Wt Readings from Last 3 Encounters:  05/19/22 229 lb (103.9  kg)  03/09/22 225 lb 12.8 oz (102.4 kg)  01/13/22 224 lb (101.6 kg)    Physical Exam Vitals reviewed.  Constitutional:      Appearance: She is well-developed.  Eyes:     Conjunctiva/sclera: Conjunctivae normal.  Cardiovascular:     Rate and Rhythm: Normal rate and regular rhythm.     Pulses: Normal pulses.     Heart sounds: Normal heart sounds.  Pulmonary:     Effort: Pulmonary effort is normal.     Breath sounds: Normal breath sounds. No wheezing, rhonchi or rales.  Musculoskeletal:     Left knee: No swelling, erythema or ecchymosis. Decreased range of motion. Tenderness present over the lateral joint line.     Right lower leg: No edema.     Left lower leg: No edema.       Legs:     Comments: Scar noted left knee. Left knee.  No effusion appreciated. No increase in warmth or erythema.  Able to extend to -5 to 10 degrees . Unable to flex to 110 degrees.  No calf tenderness of lower leg edema bilaterally. She is wearing compression stockings.    Skin:    General: Skin is warm and dry.  Neurological:     Mental Status: She is alert.  Psychiatric:        Speech: Speech normal.        Behavior: Behavior normal.        Thought Content: Thought content normal.

## 2022-05-19 NOTE — Assessment & Plan Note (Signed)
H/o Left total knee arthroplasty 12/2021 Significant knee improvement.  Patient continues to participate in physical therapy.  She feels better with tramadol 50 mg twice daily as needed which she has taken and tolerated well in the past.  She understands not to take NSAIDs.  She will let me know how she is doing.

## 2022-05-20 ENCOUNTER — Other Ambulatory Visit: Payer: Self-pay

## 2022-05-20 ENCOUNTER — Ambulatory Visit: Payer: No Typology Code available for payment source

## 2022-05-20 DIAGNOSIS — M25662 Stiffness of left knee, not elsewhere classified: Secondary | ICD-10-CM

## 2022-05-20 DIAGNOSIS — R262 Difficulty in walking, not elsewhere classified: Secondary | ICD-10-CM

## 2022-05-20 DIAGNOSIS — G8929 Other chronic pain: Secondary | ICD-10-CM

## 2022-05-20 DIAGNOSIS — M6281 Muscle weakness (generalized): Secondary | ICD-10-CM

## 2022-05-20 LAB — CBC WITH DIFFERENTIAL/PLATELET
Basophils Absolute: 0.1 10*3/uL (ref 0.0–0.1)
Basophils Relative: 1.4 % (ref 0.0–3.0)
Eosinophils Absolute: 0.1 10*3/uL (ref 0.0–0.7)
Eosinophils Relative: 1.1 % (ref 0.0–5.0)
HCT: 36.7 % (ref 36.0–46.0)
Hemoglobin: 11.9 g/dL — ABNORMAL LOW (ref 12.0–15.0)
Lymphocytes Relative: 37.3 % (ref 12.0–46.0)
Lymphs Abs: 2.6 10*3/uL (ref 0.7–4.0)
MCHC: 32.6 g/dL (ref 30.0–36.0)
MCV: 83.1 fl (ref 78.0–100.0)
Monocytes Absolute: 0.5 10*3/uL (ref 0.1–1.0)
Monocytes Relative: 6.5 % (ref 3.0–12.0)
Neutro Abs: 3.8 10*3/uL (ref 1.4–7.7)
Neutrophils Relative %: 53.7 % (ref 43.0–77.0)
Platelets: 244 10*3/uL (ref 150.0–400.0)
RBC: 4.41 Mil/uL (ref 3.87–5.11)
RDW: 17.1 % — ABNORMAL HIGH (ref 11.5–15.5)
WBC: 7 10*3/uL (ref 4.0–10.5)

## 2022-05-20 LAB — LIPID PANEL
Cholesterol: 266 mg/dL — ABNORMAL HIGH (ref 0–200)
HDL: 46.7 mg/dL (ref >39.00)
LDL Cholesterol: 182 mg/dL — ABNORMAL HIGH (ref 0–99)
NonHDL: 219.25
Total CHOL/HDL Ratio: 6
Triglycerides: 185 mg/dL — ABNORMAL HIGH (ref 0.0–149.0)
VLDL: 37 mg/dL (ref 0.0–40.0)

## 2022-05-20 LAB — HEMOGLOBIN A1C: Hgb A1c MFr Bld: 6.7 % — ABNORMAL HIGH (ref 4.6–6.5)

## 2022-05-20 NOTE — Therapy (Signed)
OUTPATIENT PHYSICAL THERAPY TREATMENT NOTE     Patient Name: Leah Roberts MRN: [id number] DOB:Oct 04, 1950, 72 y.o., female Today's Date: 05/20/2022  PCP: Burnard Hawthorne, FNP REFERRING PROVIDER: Newt Minion, MD   PT End of Session - 05/20/22 1420     Visit Number 22    Number of Visits 33    Date for PT Re-Evaluation 06/03/22    Authorization Type 10    Authorization Time Period of 10 progress report    PT Start Time 3329    PT Stop Time 1451    PT Time Calculation (min) 31 min    Activity Tolerance Patient tolerated treatment well    Behavior During Therapy WFL for tasks assessed/performed                                Past Medical History:  Diagnosis Date   Arthritis    DVT (deep venous thrombosis) ([place]) 2013   Dyspnea    due to knee pain   Family history of adverse reaction to anesthesia    Brother and sister hard to wake up.   GERD (gastroesophageal reflux disease)    History of blood transfusion    History of hypertension    Hypertension    Peripheral vascular disease ([place])    DVT   Pre-diabetes    Pulmonary embolism ([place]) 2013   Past Surgical History:  Procedure Laterality Date   APPENDECTOMY     PULMONARY THROMBECTOMY Bilateral 01/01/2022   Procedure: PULMONARY THROMBECTOMY;  Surgeon: Katha Cabal, MD;  Location: [facility];  Service: Cardiovascular;  Laterality: Bilateral;   TONSILLECTOMY AND ADENOIDECTOMY     TOTAL KNEE ARTHROPLASTY Left 12/09/2021   Procedure: LEFT TOTAL KNEE ARTHROPLASTY;  Surgeon: Newt Minion, MD;  Location: [facility];  Service: Orthopedics;  Laterality: Left;   Patient Active Problem List   Diagnosis Date Noted   Right groin pain 01/13/2022   Bilateral pulmonary embolism with right heart strain RV/LV ratio 1:5 ([place]) 12/31/2021   Acute respiratory failure with hypoxia ([place]) 12/31/2021   Total knee replacement status, left 12/09/21 12/09/2021   Unilateral primary osteoarthritis, left  knee    Shoulder disorder 09/03/2021   Arthritis of knee 09/03/2021   Lumbar radiculopathy 09/03/2021   Abnormal MRI, knee 09/03/2021   Chronic pain of right knee 09/03/2021   Bronchitis 03/25/2021   Anemia 01/09/2020   HTN (hypertension) 12/31/2019   Chest pain 12/31/2019   DOE (dyspnea on exertion) 12/31/2019   Acute coronary syndrome ([place]) 12/31/2019   Unstable angina ([place]) 12/31/2019   Atherosclerosis 08/23/2018   Screening for tuberculosis 07/12/2018   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   Arthralgia 07/12/2018   Solitary pulmonary nodule 07/12/2018   Neck pain 02/15/2018   Dizziness 02/15/2018   Elevated TSH 02/15/2018   Screening for HIV (human immunodeficiency virus) 10/05/2017   Neuropathy 10/05/2017   Screening for colon cancer 10/05/2017   Hyperlipidemia 08/04/2016   Prediabetes 08/04/2016   Back pain 08/04/2016   Bilateral knee pain 06/18/2016   History of DVT of lower extremity 04/01/2014   History of DVT and PE (deep vein thrombosis) 05/04/2011    REFERRING DIAG: J18.841 (ICD-10-CM) - Status post total left   THERAPY DIAG:  Chronic pain of left knee  Difficulty in walking, not elsewhere classified  Stiffness of left knee, not elsewhere classified  Muscle weakness (generalized)  Rationale for Evaluation and Treatment Rehabilitation  PERTINENT HISTORY:  S/P L TKE 12/09/2021. The DVT and PE prevented her from participating in much PT. Had home health PT for 4 weeks. Pt was using SPC R side prior to surgery. Heart doctor says her heart is fine (had an appointment yesterday).  PRECAUTIONS: no known precautions  SUBJECTIVE:Minimal L knee pain currently. The heaviness is still there but not bad. Better able to bend her L knee.      PAIN:  Are you having pain? No pain level reported.      TODAY'S TREATMENT:    Manual therapy   Seated STM L lateral knee around distal insertion of IT band and anterior lateral leg to decrease fascial restrictions  Seated STM L  rectus femoris and vastus lateralis and peroneal muscles to decrease tension   Seated STM L anterior knee to decrease fascial restrictions     Therapeutic exercise  Pt currently walking with SPC R side   Standing hip flexor stretch with B UE assist   L 30 seconds x 3   Slight decrease in L leg heaviness feeling when walking   Seated L knee flexion PROM with PT 10x    Seated L knee flexion AAROM with PT 10x3   90 degrees seated L knee flexion AROM after treatment      Improved exercise technique, movement at target joints, use of target muscles after min to mod verbal, visual, tactile cues.       Response to treatment Pt tolerated session well without aggravation of symptoms.          Clinical impression   Pt able to achieve up to 90 degrees seated L knee flexion AROM today after treatment. Pt gradually improving ROM. Pt tolerated session well without aggravation of symptoms. Pt will benefit from continued skilled physical therapy services to continue to improve ROM, strength and function.     PATIENT EDUCATION: Education details: there-ex, HEP Person educated: Patient Education method: Explanation, Demonstration, Tactile cues, Verbal cues, and Handouts Education comprehension: verbalized understanding and returned demonstration   HOME EXERCISE PROGRAM: Access Code: [email or web address] URL: [email or web address] Date: 02/15/2022 Prepared by: Joneen Boers  Exercises - Seated Heel Slide  - 1 x daily - 7 x weekly - 3 sets - 10 reps - 5 seconds hold  Reclined   Hip extension isometrics, L leg straight 10x5 seconds for 3 sets  - Seated Transversus Abdominis Bracing  - 5 x daily - 7 x weekly - 3 sets - 10 reps - 5 seconds hold   Seated trunk flexion stretch   PT Short Term Goals - 03/17/22 1720       PT SHORT TERM GOAL #1   Title Pt will be independent with her initial HEP to improve L knee AROM, strength, and ability to ambulate with less  difficulty.    Baseline Doing her HEP.  Does not appear to have questions with the exercises (03/17/2022)    Time 3    Period Weeks    Status Achieved    Target Date 03/04/22              PT Long Term Goals - 05/10/22 1340       PT LONG TERM GOAL #1   Title Pt will be able to ambulate at least 500 ft without AD and no LOB to promote mobility.    Baseline Pt currenty ambulating with rw (02/11/2022); Able to ambulate 40 ft wiht SPC on R side prior to sitting rest break (03/17/2022); initially ambulated with [facility] but  L knee pain increased . Currently back to rw to decrease pressure to L knee. (04/22/2022); Pt currently ambulating with SPC (05/10/2022)    Time 8    Period Weeks    Status Partially Met    Target Date 06/03/22      PT LONG TERM GOAL #2   Title Pt will improve her L knee extension AROM to 0 degrees and knee flexion AROM to at least 115 degrees to promote ability to ambulate and negotiate stairs with less difficulty.    Baseline Seated L knee AROM: -15 degrees extension, 62 degrees flexion (02/11/2022); -12 degrees extension, 75 degrees flexion (03/17/2022); -9 degrees extension, 80 degrees flexino AROM (04/22/2022); -10 degrees knee extension, 88 degrees knee flexion AROM (after treatment) (05/10/2022)    Time 8    Period Weeks    Status Partially Met    Target Date 06/03/22      PT LONG TERM GOAL #3   Title Pt will improve L knee flexion and extension strength by at least 1/2 MMT to promote ability to ambulate with less difficulty.    Baseline knee flexion 3+/5, extension 4-/5 (02/11/2022); knee flexion 3+/5, extension 5/5 (03/17/2022); knee flexion 4-/5, knee extension 5/5 (04/22/2022); 4/5 flexion, 5/5 knee extension (05/10/2022)    Time 8    Period Weeks    Status Achieved    Target Date 06/03/22      PT LONG TERM GOAL #4   Title Pt will improve her L knee FOTO score by at least 10 points as a demonstration of improved function.    Baseline L knee FOTO 36  (02/11/2022), (03/17/2022); 31.1 (04/22/2022); 44 (05/10/2022)    Time 8    Period Weeks    Status On-going    Target Date 06/03/22              Plan - 05/20/22 1419     Clinical Impression Statement Pt able to achieve up to 90 degrees seated L knee flexion AROM today after treatment. Pt gradually improving ROM. Pt tolerated session well without aggravation of symptoms. Pt will benefit from continued skilled physical therapy services to continue to improve ROM, strength and function.    Personal Factors and Comorbidities Age;Comorbidity 3+;Fitness;Past/Current Experience;Time since onset of injury/illness/exacerbation    Comorbidities DVT, arthritis, dyspnea, HTN, PE    Examination-Activity Limitations Bathing;Stairs;Dressing;Stand;Bend;Lift;Transfers;Carry;Squat;Locomotion Level    Stability/Clinical Decision Making Stable/Uncomplicated    Rehab Potential Fair    PT Frequency 2x / week    PT Duration 8 weeks    PT Treatment/Interventions Therapeutic activities;Therapeutic exercise;Functional mobility training;Balance training;Neuromuscular re-education;Patient/family education;Manual techniques;Dry needling;Aquatic Therapy;Electrical Stimulation;Iontophoresis 4mg /ml Dexamethasone;Gait training;Stair training    PT Next Visit Plan Knee A/AROM, hip and knee strengthening, gait training, manual techniques, modalities PRN    Consulted and Agree with Plan of Care Patient                           PT, DPT  05/20/2022, 5:19 PM

## 2022-05-24 ENCOUNTER — Ambulatory Visit: Payer: No Typology Code available for payment source

## 2022-05-24 ENCOUNTER — Other Ambulatory Visit: Payer: Self-pay

## 2022-05-24 ENCOUNTER — Telehealth: Payer: Self-pay

## 2022-05-24 DIAGNOSIS — M6281 Muscle weakness (generalized): Secondary | ICD-10-CM

## 2022-05-24 DIAGNOSIS — M25662 Stiffness of left knee, not elsewhere classified: Secondary | ICD-10-CM

## 2022-05-24 DIAGNOSIS — R262 Difficulty in walking, not elsewhere classified: Secondary | ICD-10-CM

## 2022-05-24 DIAGNOSIS — I7090 Unspecified atherosclerosis: Secondary | ICD-10-CM

## 2022-05-24 DIAGNOSIS — G8929 Other chronic pain: Secondary | ICD-10-CM

## 2022-05-24 MED ORDER — ROSUVASTATIN CALCIUM 5 MG PO TABS
5.0000 mg | ORAL_TABLET | Freq: Every day | ORAL | 3 refills | Status: DC
  Filled 2022-05-24: qty 90, 90d supply, fill #0
  Filled 2022-06-15: qty 90, 90d supply, fill #1
  Filled 2022-07-12 – 2022-08-18 (×2): qty 90, 90d supply, fill #2
  Filled 2023-01-12: qty 90, 90d supply, fill #3

## 2022-05-24 NOTE — Therapy (Signed)
OUTPATIENT PHYSICAL THERAPY TREATMENT NOTE     Patient Name: Leah Roberts MRN: [id number] DOB:[birth year], 72 y.o., female Today's Date: 05/24/2022  PCP: Allegra Grana, FNP REFERRING PROVIDER: Nadara Mustard, MD   PT End of Session - 05/24/22 1336     Visit Number 23    Number of Visits 33    Date for PT Re-Evaluation 06/03/22    Authorization Type 3    Authorization Time Period of 10 progress report    PT Start Time 1337    PT Stop Time 1417    PT Time Calculation (min) 40 min    Activity Tolerance Patient tolerated treatment well    Behavior During Therapy WFL for tasks assessed/performed                                 Past Medical History:  Diagnosis Date   Arthritis    DVT (deep venous thrombosis) ([facility]) 2013   Dyspnea    due to knee pain   Family history of adverse reaction to anesthesia    Brother and sister hard to wake up.   GERD (gastroesophageal reflux disease)    History of blood transfusion    History of hypertension    Hypertension    Peripheral vascular disease ([facility])    DVT   Pre-diabetes    Pulmonary embolism ([facility]) 2013   Past Surgical History:  Procedure Laterality Date   APPENDECTOMY     PULMONARY THROMBECTOMY Bilateral 01/01/2022   Procedure: PULMONARY THROMBECTOMY;  Surgeon: Renford Dills, MD;  Location: [facility];  Service: Cardiovascular;  Laterality: Bilateral;   TONSILLECTOMY AND ADENOIDECTOMY     TOTAL KNEE ARTHROPLASTY Left 12/09/2021   Procedure: LEFT TOTAL KNEE ARTHROPLASTY;  Surgeon: Nadara Mustard, MD;  Location[facility];  Service: Orthopedics;  Laterality: Left;   Patient Active Problem List   Diagnosis Date Noted   Right groin pain 01/13/2022   Bilateral pulmonary embolism with right heart strain RV/LV ratio 1:5 ([facility]) 12/31/2021   Acute respiratory failure with hypoxia ([facility]) 12/31/2021   Total knee replacement status, left 12/09/21 12/09/2021   Unilateral primary osteoarthritis, left  knee    Shoulder disorder 09/03/2021   Arthritis of knee 09/03/2021   Lumbar radiculopathy 09/03/2021   Abnormal MRI, knee 09/03/2021   Chronic pain of right knee 09/03/2021   Bronchitis 03/25/2021   Anemia 01/09/2020   HTN (hypertension) 12/31/2019   Chest pain 12/31/2019   DOE (dyspnea on exertion) 12/31/2019   Acute coronary syndrome ([facility]) 12/31/2019   Unstable angina ([facility]) 12/31/2019   Atherosclerosis 08/23/2018   Screening for tuberculosis 07/12/2018   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   Arthralgia 07/12/2018   Solitary pulmonary nodule 07/12/2018   Neck pain 02/15/2018   Dizziness 02/15/2018   Elevated TSH 02/15/2018   Screening for HIV (human immunodeficiency virus) 10/05/2017   Neuropathy 10/05/2017   Screening for colon cancer 10/05/2017   Hyperlipidemia 08/04/2016   Prediabetes 08/04/2016   Back pain 08/04/2016   Bilateral knee pain 06/18/2016   History of DVT of lower extremity 04/01/2014   History of DVT and PE (deep vein thrombosis) 05/04/2011    REFERRING DIAG: Z30.865 (ICD-10-CM) - Status post total left   THERAPY DIAG:  Chronic pain of left knee  Difficulty in walking, not elsewhere classified  Stiffness of left knee, not elsewhere classified  Muscle weakness (generalized)  Rationale for Evaluation and Treatment Rehabilitation  PERTINENT  HISTORY:  S/P L TKE 12/09/2021. The DVT and PE prevented her from participating in much PT. Had home health PT for 4 weeks. Pt was using SPC R side prior to surgery. Heart doctor says her heart is fine (had an appointment yesterday).  PRECAUTIONS: no known precautions  SUBJECTIVE: Had pain at night, took a muscle relaxer which helped. R knee bothers her more than her L.       PAIN:  Are you having pain? 5/10 while walking from waiting room to treatment room.      TODAY'S TREATMENT:    Manual therapy  Seated STM L anterior lateral knee to decrease fascial restrictions with knee in flexion   Seated STM L vastus  lateralis muscle to decrease tension      Therapeutic exercise  Pt currently walking with SPC R side   Standing L knee flexion stretch on first regular step with B UE assist 10x5 seconds   Forward step up onto first regular step with B UE assist   L 10x3  Again: Standing L knee flexion stretch on first regular step with B UE assist 10x5 seconds for 2 sets    88 degrees seated L knee flexion AROM after aforementioned exercises   Seated L knee flexion PROM with PT 10x    Seated L knee flexion AAROM with PT 10x3  Seated knee flexion red band resist 5x2      Improved exercise technique, movement at target joints, use of target muscles after min to mod verbal, visual, tactile cues.       Response to treatment Pt tolerated session well without aggravation of symptoms.          Clinical impression   Continued working on improving L knee strength and ROM secondary to weakness and stiffness. Unable to reach 90 degrees today, able to achieve 88 degrees seated knee flexion AROM. Pt tolerated session well without aggravation of symptoms. Pt will benefit from continued skilled physical therapy services to continue to improve ROM, strength and function.     PATIENT EDUCATION: Education details: there-ex, HEP Person educated: Patient Education method: Explanation, Demonstration, Tactile cues, Verbal cues, and Handouts Education comprehension: verbalized understanding and returned demonstration   HOME EXERCISE PROGRAM: Access Code: [email or web address] URL: [email or web address] Date: 02/15/2022 Prepared by: Joneen Boers  Exercises - Seated Heel Slide  - 1 x daily - 7 x weekly - 3 sets - 10 reps - 5 seconds hold  Reclined   Hip extension isometrics, L leg straight 10x5 seconds for 3 sets  - Seated Transversus Abdominis Bracing  - 5 x daily - 7 x weekly - 3 sets - 10 reps - 5 seconds hold   Seated trunk flexion stretch   PT Short Term Goals - 03/17/22 1720        PT SHORT TERM GOAL #1   Title Pt will be independent with her initial HEP to improve L knee AROM, strength, and ability to ambulate with less difficulty.    Baseline Doing her HEP.  Does not appear to have questions with the exercises (03/17/2022)    Time 3    Period Weeks    Status Achieved    Target Date 03/04/22              PT Long Term Goals - 05/10/22 1340       PT LONG TERM GOAL #1   Title Pt will be able to ambulate at least 500 ft without AD and no LOB to promote  mobility.    Baseline Pt currenty ambulating with rw (02/11/2022); Able to ambulate 40 ft wiht SPC on R side prior to sitting rest break (03/17/2022); initially ambulated with [facility] but L knee pain increased . Currently back to rw to decrease pressure to L knee. (04/22/2022); Pt currently ambulating with SPC (05/10/2022)    Time 8    Period Weeks    Status Partially Met    Target Date 06/03/22      PT LONG TERM GOAL #2   Title Pt will improve her L knee extension AROM to 0 degrees and knee flexion AROM to at least 115 degrees to promote ability to ambulate and negotiate stairs with less difficulty.    Baseline Seated L knee AROM: -15 degrees extension, 62 degrees flexion (02/11/2022); -12 degrees extension, 75 degrees flexion (03/17/2022); -9 degrees extension, 80 degrees flexino AROM (04/22/2022); -10 degrees knee extension, 88 degrees knee flexion AROM (after treatment) (05/10/2022)    Time 8    Period Weeks    Status Partially Met    Target Date 06/03/22      PT LONG TERM GOAL #3   Title Pt will improve L knee flexion and extension strength by at least 1/2 MMT to promote ability to ambulate with less difficulty.    Baseline knee flexion 3+/5, extension 4-/5 (02/11/2022); knee flexion 3+/5, extension 5/5 (03/17/2022); knee flexion 4-/5, knee extension 5/5 (04/22/2022); 4/5 flexion, 5/5 knee extension (05/10/2022)    Time 8    Period Weeks    Status Achieved    Target Date 06/03/22      PT LONG TERM GOAL #4    Title Pt will improve her L knee FOTO score by at least 10 points as a demonstration of improved function.    Baseline L knee FOTO 36 (02/11/2022), (03/17/2022); 31.1 (04/22/2022); 44 (05/10/2022)    Time 8    Period Weeks    Status On-going    Target Date 06/03/22              Plan - 05/24/22 1336     Clinical Impression Statement Continued working on improving L knee strength and ROM secondary to weakness and stiffness. Unable to reach 90 degrees today, able to achieve 88 degrees seated knee flexion AROM. Pt tolerated session well without aggravation of symptoms. Pt will benefit from continued skilled physical therapy services to continue to improve ROM, strength and function.    Personal Factors and Comorbidities Age;Comorbidity 3+;Fitness;Past/Current Experience;Time since onset of injury/illness/exacerbation    Comorbidities DVT, arthritis, dyspnea, HTN, PE    Examination-Activity Limitations Bathing;Stairs;Dressing;Stand;Bend;Lift;Transfers;Carry;Squat;Locomotion Level    Stability/Clinical Decision Making Stable/Uncomplicated    Clinical Decision Making Low    Rehab Potential Fair    PT Frequency 2x / week    PT Duration 8 weeks    PT Treatment/Interventions Therapeutic activities;Therapeutic exercise;Functional mobility training;Balance training;Neuromuscular re-education;Patient/family education;Manual techniques;Dry needling;Aquatic Therapy;Electrical Stimulation;Iontophoresis 4mg /ml Dexamethasone;Gait training;Stair training    PT Next Visit Plan Knee A/AROM, hip and knee strengthening, gait training, manual techniques, modalities PRN    Consulted and Agree with Plan of Care Patient                           Joneen Boers PT, DPT  05/24/2022, 2:29 PM

## 2022-05-24 NOTE — Telephone Encounter (Signed)
RX sent in and pt is aware

## 2022-05-24 NOTE — Telephone Encounter (Signed)
Prescription Request  05/24/2022  Is this a "Controlled Substance" medicine? No  LOV: Visit date not found  What is the name of the medication or equipment? rosuvastatin (CRESTOR) 5 MG tablet  Have you contacted your pharmacy to request a refill? No   Which pharmacy would you like this sent to?  [facility] [place] [address] Phone: [phone] Fax: [phone]  Patient notified that their request is being sent to the clinical staff for review and that they should receive a response within 2 business days.   Please advise at Mobile [phone] (mobile)   Patient states she has a couple of pills left. Patient states she would like to verify the dose she needs to be taking.

## 2022-05-26 ENCOUNTER — Other Ambulatory Visit: Payer: Self-pay

## 2022-05-27 ENCOUNTER — Ambulatory Visit: Payer: No Typology Code available for payment source

## 2022-05-27 ENCOUNTER — Other Ambulatory Visit: Payer: Self-pay

## 2022-05-27 DIAGNOSIS — R262 Difficulty in walking, not elsewhere classified: Secondary | ICD-10-CM

## 2022-05-27 DIAGNOSIS — G8929 Other chronic pain: Secondary | ICD-10-CM

## 2022-05-27 DIAGNOSIS — M6281 Muscle weakness (generalized): Secondary | ICD-10-CM

## 2022-05-27 DIAGNOSIS — M25662 Stiffness of left knee, not elsewhere classified: Secondary | ICD-10-CM

## 2022-05-27 NOTE — Therapy (Signed)
OUTPATIENT PHYSICAL THERAPY TREATMENT NOTE     Patient Name: Leah Roberts MRN: [id number] DOB:07/07/1950, 72 y.o., female Today's Date: 05/27/2022  PCP: Allegra Grana, FNP REFERRING PROVIDER: Nadara Mustard, MD   PT End of Session - 05/27/22 1419     Visit Number 24    Number of Visits 33    Date for PT Re-Evaluation 06/03/22    Authorization Type 4    Authorization Time Period of 10 progress report    PT Start Time 1419    PT Stop Time 1500    PT Time Calculation (min) 41 min    Activity Tolerance Patient tolerated treatment well    Behavior During Therapy WFL for tasks assessed/performed                                  Past Medical History:  Diagnosis Date   Arthritis    DVT (deep venous thrombosis) (HCC) 2013   Dyspnea    due to knee pain   Family history of adverse reaction to anesthesia    Brother and sister hard to wake up.   GERD (gastroesophageal reflux disease)    History of blood transfusion    History of hypertension    Hypertension    Peripheral vascular disease (HCC)    DVT   Pre-diabetes    Pulmonary embolism (HCC) 2013   Past Surgical History:  Procedure Laterality Date   APPENDECTOMY     PULMONARY THROMBECTOMY Bilateral 01/01/2022   Procedure: PULMONARY THROMBECTOMY;  Surgeon: Renford Dills, MD;  Location: [facility];  Service: Cardiovascular;  Laterality: Bilateral;   TONSILLECTOMY AND ADENOIDECTOMY     TOTAL KNEE ARTHROPLASTY Left 12/09/2021   Procedure: LEFT TOTAL KNEE ARTHROPLASTY;  Surgeon: Nadara Mustard, MD;  Location: [facility] OR;  Service: Orthopedics;  Laterality: Left;   Patient Active Problem List   Diagnosis Date Noted   Right groin pain 01/13/2022   Bilateral pulmonary embolism with right heart strain RV/LV ratio 1:5 (HCC) 12/31/2021   Acute respiratory failure with hypoxia (HCC) 12/31/2021   Total knee replacement status, left 12/09/21 12/09/2021   Unilateral primary osteoarthritis, left  knee    Shoulder disorder 09/03/2021   Arthritis of knee 09/03/2021   Lumbar radiculopathy 09/03/2021   Abnormal MRI, knee 09/03/2021   Chronic pain of right knee 09/03/2021   Bronchitis 03/25/2021   Anemia 01/09/2020   HTN (hypertension) 12/31/2019   Chest pain 12/31/2019   DOE (dyspnea on exertion) 12/31/2019   Acute coronary syndrome (HCC) 12/31/2019   Unstable angina (HCC) 12/31/2019   Atherosclerosis 08/23/2018   Screening for tuberculosis 07/12/2018   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   Arthralgia 07/12/2018   Solitary pulmonary nodule 07/12/2018   Neck pain 02/15/2018   Dizziness 02/15/2018   Elevated TSH 02/15/2018   Screening for HIV (human immunodeficiency virus) 10/05/2017   Neuropathy 10/05/2017   Screening for colon cancer 10/05/2017   Hyperlipidemia 08/04/2016   Prediabetes 08/04/2016   Back pain 08/04/2016   Bilateral knee pain 06/18/2016   History of DVT of lower extremity 04/01/2014   History of DVT and PE (deep vein thrombosis) 05/04/2011    REFERRING DIAG: U13.244 (ICD-10-CM) - Status post total left   THERAPY DIAG:  Chronic pain of left knee  Difficulty in walking, not elsewhere classified  Stiffness of left knee, not elsewhere classified  Muscle weakness (generalized)  Rationale for Evaluation and Treatment Rehabilitation  PERTINENT HISTORY:  S/P L TKE 12/09/2021. The DVT and PE prevented her from participating in much PT. Had home health PT for 4 weeks. Pt was using SPC R side prior to surgery. Heart doctor says her heart is fine (had an appointment yesterday).  PRECAUTIONS: no known precautions  SUBJECTIVE: Can't complain, just a little bit of L knee pain, 4-5/10 currently. Going to [place] on February 9 for 6 weeks. Can graduate and continue PT with home exercises. Getting better.       PAIN:  Are you having pain? 5/10 while walking from waiting room to treatment room.      TODAY'S TREATMENT:    Manual therapy  Seated STM L rectus femoris  and vastus lateralis muscle to decrease tension with knee in flexion     Therapeutic exercise  Pt currently walking with SPC R side   Seated L knee flexion resisting red band 7x, then 5x2  Gait without using SPC (pt carrying AD) 75 ft, pt only used SPC 2x.   Standing L knee flexion stretch on first regular step with B UE assist 10x5 seconds   Forward step up onto first regular step with B UE assist   L 10x3  Again: Standing L knee flexion stretch on first regular step with B UE assist 10x5 seconds for 2 sets    90 degrees seated L knee flexion AROM after aforementioned treatment    Seated L knee flexion AAROM with PT 10x2       Improved exercise technique, movement at target joints, use of target muscles after min to mod verbal, visual, tactile cues.       Response to treatment Pt tolerated session well without aggravation of symptoms.          Clinical impression  Worked on gait without use of AD to promote more independent mobility. Continued working on improving L knee strength and ROM secondary to weakness and stiffness. Pt able to achieve 90 degrees seated knee flexion AROM again today. Pt tolerated session well without aggravation of symptoms. Pt will benefit from continued skilled physical therapy services to continue to improve ROM, strength and function.     PATIENT EDUCATION: Education details: there-ex, HEP Person educated: Patient Education method: Explanation, Demonstration, Tactile cues, Verbal cues, and Handouts Education comprehension: verbalized understanding and returned demonstration   HOME EXERCISE PROGRAM: Access Code: [email or web address] URL: [email or web address] Date: 02/15/2022 Prepared by: Joneen Boers  Exercises - Seated Heel Slide  - 1 x daily - 7 x weekly - 3 sets - 10 reps - 5 seconds hold  Reclined   Hip extension isometrics, L leg straight 10x5 seconds for 3 sets  - Seated Transversus Abdominis Bracing  - 5 x  daily - 7 x weekly - 3 sets - 10 reps - 5 seconds hold   Seated trunk flexion stretch   PT Short Term Goals - 03/17/22 1720       PT SHORT TERM GOAL #1   Title Pt will be independent with her initial HEP to improve L knee AROM, strength, and ability to ambulate with less difficulty.    Baseline Doing her HEP.  Does not appear to have questions with the exercises (03/17/2022)    Time 3    Period Weeks    Status Achieved    Target Date 03/04/22              PT Long Term Goals - 05/10/22 1340       PT LONG TERM  GOAL #1   Title Pt will be able to ambulate at least 500 ft without AD and no LOB to promote mobility.    Baseline Pt currenty ambulating with rw (02/11/2022); Able to ambulate 40 ft wiht SPC on R side prior to sitting rest break (03/17/2022); initially ambulated with [facility] but L knee pain increased . Currently back to rw to decrease pressure to L knee. (04/22/2022); Pt currently ambulating with SPC (05/10/2022)    Time 8    Period Weeks    Status Partially Met    Target Date 06/03/22      PT LONG TERM GOAL #2   Title Pt will improve her L knee extension AROM to 0 degrees and knee flexion AROM to at least 115 degrees to promote ability to ambulate and negotiate stairs with less difficulty.    Baseline Seated L knee AROM: -15 degrees extension, 62 degrees flexion (02/11/2022); -12 degrees extension, 75 degrees flexion (03/17/2022); -9 degrees extension, 80 degrees flexino AROM (04/22/2022); -10 degrees knee extension, 88 degrees knee flexion AROM (after treatment) (05/10/2022)    Time 8    Period Weeks    Status Partially Met    Target Date 06/03/22      PT LONG TERM GOAL #3   Title Pt will improve L knee flexion and extension strength by at least 1/2 MMT to promote ability to ambulate with less difficulty.    Baseline knee flexion 3+/5, extension 4-/5 (02/11/2022); knee flexion 3+/5, extension 5/5 (03/17/2022); knee flexion 4-/5, knee extension 5/5 (04/22/2022); 4/5 flexion,  5/5 knee extension (05/10/2022)    Time 8    Period Weeks    Status Achieved    Target Date 06/03/22      PT LONG TERM GOAL #4   Title Pt will improve her L knee FOTO score by at least 10 points as a demonstration of improved function.    Baseline L knee FOTO 36 (02/11/2022), (03/17/2022); 31.1 (04/22/2022); 44 (05/10/2022)    Time 8    Period Weeks    Status On-going    Target Date 06/03/22              Plan - 05/27/22 1417     Clinical Impression Statement Worked on gait without use of AD to promote more independent mobility. Continued working on improving L knee strength and ROM secondary to weakness and stiffness. Pt able to achieve 90 degrees seated knee flexion AROM again today. Pt tolerated session well without aggravation of symptoms. Pt will benefit from continued skilled physical therapy services to continue to improve ROM, strength and function.    Personal Factors and Comorbidities Age;Comorbidity 3+;Fitness;Past/Current Experience;Time since onset of injury/illness/exacerbation    Comorbidities DVT, arthritis, dyspnea, HTN, PE    Examination-Activity Limitations Bathing;Stairs;Dressing;Stand;Bend;Lift;Transfers;Carry;Squat;Locomotion Level    Stability/Clinical Decision Making Stable/Uncomplicated    Rehab Potential Fair    PT Frequency 2x / week    PT Duration 8 weeks    PT Treatment/Interventions Therapeutic activities;Therapeutic exercise;Functional mobility training;Balance training;Neuromuscular re-education;Patient/family education;Manual techniques;Dry needling;Aquatic Therapy;Electrical Stimulation;Iontophoresis 4mg /ml Dexamethasone;Gait training;Stair training    PT Next Visit Plan Knee A/AROM, hip and knee strengthening, gait training, manual techniques, modalities PRN    Consulted and Agree with Plan of Care Patient                           Joneen Boers PT, DPT  05/27/2022, 4:15 PM

## 2022-05-31 ENCOUNTER — Ambulatory Visit: Payer: No Typology Code available for payment source

## 2022-05-31 DIAGNOSIS — M6281 Muscle weakness (generalized): Secondary | ICD-10-CM

## 2022-05-31 DIAGNOSIS — G8929 Other chronic pain: Secondary | ICD-10-CM

## 2022-05-31 DIAGNOSIS — M25662 Stiffness of left knee, not elsewhere classified: Secondary | ICD-10-CM

## 2022-05-31 DIAGNOSIS — R262 Difficulty in walking, not elsewhere classified: Secondary | ICD-10-CM

## 2022-05-31 NOTE — Therapy (Signed)
OUTPATIENT PHYSICAL THERAPY TREATMENT NOTE     Patient Name: Leah Roberts MRN: [id number] DOB:1951-04-11, 72 y.o., female Today's Date: 05/31/2022  PCP: Burnard Hawthorne, FNP REFERRING PROVIDER: Newt Minion, MD   PT End of Session - 05/31/22 1334     Visit Number 25    Number of Visits 33    Date for PT Re-Evaluation 06/03/22    Authorization Type 5    Authorization Time Period of 10 progress report    PT Start Time 1334    PT Stop Time 1415    PT Time Calculation (min) 41 min    Activity Tolerance Patient tolerated treatment well    Behavior During Therapy WFL for tasks assessed/performed                                   Past Medical History:  Diagnosis Date   Arthritis    DVT (deep venous thrombosis) ([place]) 2013   Dyspnea    due to knee pain   Family history of adverse reaction to anesthesia    Brother and sister hard to wake up.   GERD (gastroesophageal reflux disease)    History of blood transfusion    History of hypertension    Hypertension    Peripheral vascular disease ([place])    DVT   Pre-diabetes    Pulmonary embolism ([place]) 2013   Past Surgical History:  Procedure Laterality Date   APPENDECTOMY     PULMONARY THROMBECTOMY Bilateral 01/01/2022   Procedure: PULMONARY THROMBECTOMY;  Surgeon: Katha Cabal, MD;  Location: [place] [facility];  Service: Cardiovascular;  Laterality: Bilateral;   TONSILLECTOMY AND ADENOIDECTOMY     TOTAL KNEE ARTHROPLASTY Left 12/09/2021   Procedure: LEFT TOTAL KNEE ARTHROPLASTY;  Surgeon: Newt Minion, MD;  Location: [facility];  Service: Orthopedics;  Laterality: Left;   Patient Active Problem List   Diagnosis Date Noted   Right groin pain 01/13/2022   Bilateral pulmonary embolism with right heart strain RV/LV ratio 1:5 ([place]) 12/31/2021   Acute respiratory failure with hypoxia ([place]) 12/31/2021   Total knee replacement status, left 12/09/21 12/09/2021   Unilateral primary osteoarthritis,  left knee    Shoulder disorder 09/03/2021   Arthritis of knee 09/03/2021   Lumbar radiculopathy 09/03/2021   Abnormal MRI, knee 09/03/2021   Chronic pain of right knee 09/03/2021   Bronchitis 03/25/2021   Anemia 01/09/2020   HTN (hypertension) 12/31/2019   Chest pain 12/31/2019   DOE (dyspnea on exertion) 12/31/2019   Acute coronary syndrome ([place]) 12/31/2019   Unstable angina ([place]) 12/31/2019   Atherosclerosis 08/23/2018   Screening for tuberculosis 07/12/2018   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   Arthralgia 07/12/2018   Solitary pulmonary nodule 07/12/2018   Neck pain 02/15/2018   Dizziness 02/15/2018   Elevated TSH 02/15/2018   Screening for HIV (human immunodeficiency virus) 10/05/2017   Neuropathy 10/05/2017   Screening for colon cancer 10/05/2017   Hyperlipidemia 08/04/2016   Prediabetes 08/04/2016   Back pain 08/04/2016   Bilateral knee pain 06/18/2016   History of DVT of lower extremity 04/01/2014   History of DVT and PE (deep vein thrombosis) 05/04/2011    REFERRING DIAG: Q76.195 (ICD-10-CM) - Status post total left   THERAPY DIAG:  Chronic pain of left knee  Difficulty in walking, not elsewhere classified  Stiffness of left knee, not elsewhere classified  Muscle weakness (generalized)  Rationale for Evaluation and Treatment Rehabilitation  PERTINENT HISTORY:  S/P L TKE 12/09/2021. The DVT and PE prevented her from participating in much PT. Had home health PT for 4 weeks. Pt was using SPC R side prior to surgery. Heart doctor says her heart is fine (had an appointment yesterday).  PRECAUTIONS: no known precautions  SUBJECTIVE: L knee is doing good, but sometimes is bad. Now its getting better. Pain started from L low back to her L knee. The seated trunk flexion helps. 6/10 L low back pain currently. 5/10 L knee pain while walking.      PAIN:  Are you having pain? 5/10 while walking from waiting room to treatment room.      TODAY'S TREATMENT:    Manual  therapy  Seated STM L anterior medial and lateral knee to decrease fascial restrictions  Seated STM L rectus femoris and vastus lateralis muscle to decrease tension with knee in flexion     Therapeutic exercise  Pt currently walking with SPC R side   Seated trunk flexion to the R 30 seconds x 3  Reclined   Hooklying    posterior pelvic tilt 10x5 seconds for 3 sets    Decreased L low back pain reported  Forward step up onto first regular step with B UE assist   L 10x3  Standing L knee flexion stretch on first regular step with B UE assist 10x5 seconds  Again: Standing L knee flexion stretch on first regular step with B UE assist 10x5 seconds      Improved exercise technique, movement at target joints, use of target muscles after min to mod verbal, visual, tactile cues.       Response to treatment Pt tolerated session well without aggravation of symptoms.          Clinical impression   Worked on decreasing low back extension stress to decrease symptoms to L LE. Decreased L low back and L LE radiating pain reported afterwards. Continued working on improving L knee strength and ROM secondary to weakness and stiffness. Pt tolerated session well without aggravation of symptoms. Pt will benefit from continued skilled physical therapy services to continue to improve ROM, strength and function.     PATIENT EDUCATION: Education details: there-ex, HEP Person educated: Patient Education method: Explanation, Demonstration, Tactile cues, Verbal cues, and Handouts Education comprehension: verbalized understanding and returned demonstration   HOME EXERCISE PROGRAM: Access Code: [email or web address] URL: [email or web address] Date: 02/15/2022 Prepared by: Joneen Boers  Exercises - Seated Heel Slide  - 1 x daily - 7 x weekly - 3 sets - 10 reps - 5 seconds hold  Reclined   Hip extension isometrics, L leg straight 10x5 seconds for 3 sets  - Seated Transversus  Abdominis Bracing  - 5 x daily - 7 x weekly - 3 sets - 10 reps - 5 seconds hold   Seated trunk flexion stretch  - Supine Posterior Pelvic Tilt  - 1 x daily - 7 x weekly - 3 sets - 10 reps - 5 seconds hold - Standing Knee Flexion Stretch on Step  - 1 x daily - 7 x weekly - 3 sets - 10 reps - 5 seconds hold    PT Short Term Goals - 03/17/22 1720       PT SHORT TERM GOAL #1   Title Pt will be independent with her initial HEP to improve L knee AROM, strength, and ability to ambulate with less difficulty.    Baseline Doing her HEP.  Does not appear to have questions  with the exercises (03/17/2022)    Time 3    Period Weeks    Status Achieved    Target Date 03/04/22              PT Long Term Goals - 05/10/22 1340       PT LONG TERM GOAL #1   Title Pt will be able to ambulate at least 500 ft without AD and no LOB to promote mobility.    Baseline Pt currenty ambulating with rw (02/11/2022); Able to ambulate 40 ft wiht SPC on R side prior to sitting rest break (03/17/2022); initially ambulated with [facility] but L knee pain increased . Currently back to rw to decrease pressure to L knee. (04/22/2022); Pt currently ambulating with SPC (05/10/2022)    Time 8    Period Weeks    Status Partially Met    Target Date 06/03/22      PT LONG TERM GOAL #2   Title Pt will improve her L knee extension AROM to 0 degrees and knee flexion AROM to at least 115 degrees to promote ability to ambulate and negotiate stairs with less difficulty.    Baseline Seated L knee AROM: -15 degrees extension, 62 degrees flexion (02/11/2022); -12 degrees extension, 75 degrees flexion (03/17/2022); -9 degrees extension, 80 degrees flexino AROM (04/22/2022); -10 degrees knee extension, 88 degrees knee flexion AROM (after treatment) (05/10/2022)    Time 8    Period Weeks    Status Partially Met    Target Date 06/03/22      PT LONG TERM GOAL #3   Title Pt will improve L knee flexion and extension strength by at least 1/2 MMT  to promote ability to ambulate with less difficulty.    Baseline knee flexion 3+/5, extension 4-/5 (02/11/2022); knee flexion 3+/5, extension 5/5 (03/17/2022); knee flexion 4-/5, knee extension 5/5 (04/22/2022); 4/5 flexion, 5/5 knee extension (05/10/2022)    Time 8    Period Weeks    Status Achieved    Target Date 06/03/22      PT LONG TERM GOAL #4   Title Pt will improve her L knee FOTO score by at least 10 points as a demonstration of improved function.    Baseline L knee FOTO 36 (02/11/2022), (03/17/2022); 31.1 (04/22/2022); 44 (05/10/2022)    Time 8    Period Weeks    Status On-going    Target Date 06/03/22              Plan - 05/31/22 1334     Clinical Impression Statement Worked on decreasing low back extension stress to decrease symptoms to L LE. Decreased L low back and L LE radiating pain reported afterwards. Continued working on improving L knee strength and ROM secondary to weakness and stiffness. Pt tolerated session well without aggravation of symptoms. Pt will benefit from continued skilled physical therapy services to continue to improve ROM, strength and function.    Personal Factors and Comorbidities Age;Comorbidity 3+;Fitness;Past/Current Experience;Time since onset of injury/illness/exacerbation    Comorbidities DVT, arthritis, dyspnea, HTN, PE    Examination-Activity Limitations Bathing;Stairs;Dressing;Stand;Bend;Lift;Transfers;Carry;Squat;Locomotion Level    Stability/Clinical Decision Making Stable/Uncomplicated    Rehab Potential Fair    PT Frequency 2x / week    PT Duration 8 weeks    PT Treatment/Interventions Therapeutic activities;Therapeutic exercise;Functional mobility training;Balance training;Neuromuscular re-education;Patient/family education;Manual techniques;Dry needling;Aquatic Therapy;Electrical Stimulation;Iontophoresis 4mg /ml Dexamethasone;Gait training;Stair training    PT Next Visit Plan Knee A/AROM, hip and knee strengthening, gait training,  manual techniques, modalities PRN  Consulted and Agree with Plan of Care Patient                            Loralyn Freshwater PT, DPT  05/31/2022, 3:21 PM

## 2022-06-02 ENCOUNTER — Ambulatory Visit: Payer: No Typology Code available for payment source

## 2022-06-02 DIAGNOSIS — G8929 Other chronic pain: Secondary | ICD-10-CM

## 2022-06-02 DIAGNOSIS — M25662 Stiffness of left knee, not elsewhere classified: Secondary | ICD-10-CM

## 2022-06-02 DIAGNOSIS — M6281 Muscle weakness (generalized): Secondary | ICD-10-CM

## 2022-06-02 DIAGNOSIS — R262 Difficulty in walking, not elsewhere classified: Secondary | ICD-10-CM

## 2022-06-02 NOTE — Therapy (Signed)
OUTPATIENT PHYSICAL THERAPY TREATMENT NOTE And Discharge Summary     Patient Name: Leah Roberts MRN: [id number] DOB:02-28-1951, 72 y.o., female Today's Date: 06/02/2022  PCP: Burnard Hawthorne, FNP REFERRING PROVIDER: Newt Minion, MD   PT End of Session - 06/02/22 1420     Visit Number 26    Number of Visits 33    Date for PT Re-Evaluation 06/03/22    Authorization Type 6    Authorization Time Period of 10 progress report    PT Start Time 1420    PT Stop Time 1500    PT Time Calculation (min) 40 min    Activity Tolerance Patient tolerated treatment well    Behavior During Therapy WFL for tasks assessed/performed                                    Past Medical History:  Diagnosis Date   Arthritis    DVT (deep venous thrombosis) ([place]) 2013   Dyspnea    due to knee pain   Family history of adverse reaction to anesthesia    Brother and sister hard to wake up.   GERD (gastroesophageal reflux disease)    History of blood transfusion    History of hypertension    Hypertension    Peripheral vascular disease ([place])    DVT   Pre-diabetes    Pulmonary embolism ([place]) 2013   Past Surgical History:  Procedure Laterality Date   APPENDECTOMY     PULMONARY THROMBECTOMY Bilateral 01/01/2022   Procedure: PULMONARY THROMBECTOMY;  Surgeon: Katha Cabal, MD;  Location: [facility];  Service: Cardiovascular;  Laterality: Bilateral;   TONSILLECTOMY AND ADENOIDECTOMY     TOTAL KNEE ARTHROPLASTY Left 12/09/2021   Procedure: LEFT TOTAL KNEE ARTHROPLASTY;  Surgeon: Newt Minion, MD;  Location: [facility];  Service: Orthopedics;  Laterality: Left;   Patient Active Problem List   Diagnosis Date Noted   Right groin pain 01/13/2022   Bilateral pulmonary embolism with right heart strain RV/LV ratio 1:5 ([place]) 12/31/2021   Acute respiratory failure with hypoxia ([place]) 12/31/2021   Total knee replacement status, left 12/09/21 12/09/2021   Unilateral  primary osteoarthritis, left knee    Shoulder disorder 09/03/2021   Arthritis of knee 09/03/2021   Lumbar radiculopathy 09/03/2021   Abnormal MRI, knee 09/03/2021   Chronic pain of right knee 09/03/2021   Bronchitis 03/25/2021   Anemia 01/09/2020   HTN (hypertension) 12/31/2019   Chest pain 12/31/2019   DOE (dyspnea on exertion) 12/31/2019   Acute coronary syndrome ([place]) 12/31/2019   Unstable angina ([place]) 12/31/2019   Atherosclerosis 08/23/2018   Screening for tuberculosis 07/12/2018   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   Arthralgia 07/12/2018   Solitary pulmonary nodule 07/12/2018   Neck pain 02/15/2018   Dizziness 02/15/2018   Elevated TSH 02/15/2018   Screening for HIV (human immunodeficiency virus) 10/05/2017   Neuropathy 10/05/2017   Screening for colon cancer 10/05/2017   Hyperlipidemia 08/04/2016   Prediabetes 08/04/2016   Back pain 08/04/2016   Bilateral knee pain 06/18/2016   History of DVT of lower extremity 04/01/2014   History of DVT and PE (deep vein thrombosis) 05/04/2011    REFERRING DIAG: F09.323 (ICD-10-CM) - Status post total left   THERAPY DIAG:  Chronic pain of left knee  Difficulty in walking, not elsewhere classified  Stiffness of left knee, not elsewhere classified  Muscle weakness (generalized)  Rationale for  Evaluation and Treatment Rehabilitation  PERTINENT HISTORY:  S/P L TKE 12/09/2021. The DVT and PE prevented her from participating in much PT. Had home health PT for 4 weeks. Pt was using SPC R side prior to surgery. Heart doctor says her heart is fine (had an appointment yesterday).  PRECAUTIONS: no known precautions  SUBJECTIVE: feels good about today being her last day of PT. Feels confident with her exercises. Feeling much better.  The exercises help with her L low back.      PAIN:  Are you having pain? 4/10 while walking from waiting room to treatment room.      TODAY'S TREATMENT:    Manual therapy  Seated STM L anterior medial and  lateral knee to decrease fascial restrictions  Seated STM L rectus femoris and vastus lateralis muscle to decrease tension with knee in flexion     Therapeutic exercise  Pt currently walking with SPC R side   Seated L knee AROM 1x each way  Gait without AD 120 ft, L low back symptoms.   Seated trunk flexion to the R 30 seconds x 3  Seated L knee flexion PROM with PT 10x     Seated L knee flexion AAROM with PT 10x3     Improved exercise technique, movement at target joints, use of target muscles after min to mod verbal, visual, tactile cues.       Response to treatment Pt tolerated session well without aggravation of symptoms.          Clinical impression   Pt started PT 26 sessions ago with significant stiffness with knee flexion > knee extension. Pt finally able to achieve 90 degrees flexion for the past few sessions. Pt L knee currently demonstrates greater knee flexion and extension AROM compared to her non-surgical side suggesting greater motion currently compared to prior to her procedure. Progress was very slow secondary to the level of stiffness involved but pt has improved. Pt also demonstrates improved strength, ability to ambulate and function since initial evaluation as well as independence and consistency with her HEP. Skilled physical therapy services discharged with pt continuing her progress with her exercises at home.        PATIENT EDUCATION: Education details: there-ex, HEP Person educated: Patient Education method: Explanation, Demonstration, Tactile cues, Verbal cues, and Handouts Education comprehension: verbalized understanding and returned demonstration   HOME EXERCISE PROGRAM: Access Code: [email or web address] URL: [email or web address] Date: 02/15/2022 Prepared by: Joneen Boers  Exercises - Seated Heel Slide  - 1 x daily - 7 x weekly - 3 sets - 10 reps - 5 seconds hold  Reclined   Hip extension isometrics, L leg straight 10x5  seconds for 3 sets  - Seated Transversus Abdominis Bracing  - 5 x daily - 7 x weekly - 3 sets - 10 reps - 5 seconds hold   Seated trunk flexion stretch  - Supine Posterior Pelvic Tilt  - 1 x daily - 7 x weekly - 3 sets - 10 reps - 5 seconds hold - Standing Knee Flexion Stretch on Step  - 1 x daily - 7 x weekly - 3 sets - 10 reps - 5 seconds hold    PT Short Term Goals - 03/17/22 1720       PT SHORT TERM GOAL #1   Title Pt will be independent with her initial HEP to improve L knee AROM, strength, and ability to ambulate with less difficulty.    Baseline Doing her HEP.  Does not appear  to have questions with the exercises (03/17/2022)    Time 3    Period Weeks    Status Achieved    Target Date 03/04/22              PT Long Term Goals - 06/02/22 1422       PT LONG TERM GOAL #1   Title Pt will be able to ambulate at least 500 ft without AD and no LOB to promote mobility.    Baseline Pt currenty ambulating with rw (02/11/2022); Able to ambulate 40 ft wiht SPC on R side prior to sitting rest break (03/17/2022); initially ambulated with [facility] but L knee pain increased . Currently back to rw to decrease pressure to L knee. (04/22/2022); Pt currently ambulating with SPC (05/10/2022); 120 ft without AD, limited by L low back symptoms. Able to ambulate with SPC safely (06/02/2022)    Time 8    Period Weeks    Status Partially Met    Target Date 06/03/22      PT LONG TERM GOAL #2   Title Pt will improve her L knee extension AROM to 0 degrees and knee flexion AROM to at least 115 degrees to promote ability to ambulate and negotiate stairs with less difficulty.    Baseline Seated L knee AROM: -15 degrees extension, 62 degrees flexion (02/11/2022); -12 degrees extension, 75 degrees flexion (03/17/2022); -9 degrees extension, 80 degrees flexino AROM (04/22/2022); -10 degrees knee extension, 88 degrees knee flexion AROM (after treatment) (05/10/2022); -9 degrees knee extension. Able to achieve 90  degrees seated knee flexion AROM after treatment  (06/02/2022)    Time 8    Period Weeks    Status Partially Met    Target Date 06/03/22      PT LONG TERM GOAL #3   Title Pt will improve L knee flexion and extension strength by at least 1/2 MMT to promote ability to ambulate with less difficulty.    Baseline knee flexion 3+/5, extension 4-/5 (02/11/2022); knee flexion 3+/5, extension 5/5 (03/17/2022); knee flexion 4-/5, knee extension 5/5 (04/22/2022); 4/5 flexion, 5/5 knee extension (05/10/2022)    Time 8    Period Weeks    Status Achieved    Target Date 06/03/22      PT LONG TERM GOAL #4   Title Pt will improve her L knee FOTO score by at least 10 points as a demonstration of improved function.    Baseline L knee FOTO 36 (02/11/2022), (03/17/2022); 31.1 (04/22/2022); 44 (05/10/2022), (06/02/2022)    Time 8    Period Weeks    Status On-going    Target Date 06/03/22              Plan - 06/02/22 1419     Clinical Impression Statement Pt started PT 26 sessions ago with significant stiffness with knee flexion > knee extension. Pt finally able to achieve 90 degrees flexion for the past few sessions. Pt L knee currently demonstrates greater knee flexion and extension AROM compared to her non-surgical side suggesting greater motion currently compared to prior to her procedure. Progress was very slow secondary to the level of stiffness involved but pt has improved. Pt also demonstrates improved strength, ability to ambulate and function since initial evaluation as well as independence and consistency with her HEP. Skilled physical therapy services discharged with pt continuing her progress with her exercises at home.    Personal Factors and Comorbidities Age;Comorbidity 3+;Fitness;Past/Current Experience;Time since onset of injury/illness/exacerbation    Comorbidities DVT,  arthritis, dyspnea, HTN, PE    Examination-Activity Limitations  Bathing;Stairs;Dressing;Stand;Bend;Lift;Transfers;Carry;Squat;Locomotion Level    Stability/Clinical Decision Making Stable/Uncomplicated    Clinical Decision Making Low    Rehab Potential Fair    PT Frequency 2x / week    PT Duration 8 weeks    PT Treatment/Interventions Therapeutic activities;Therapeutic exercise;Functional mobility training;Balance training;Neuromuscular re-education;Patient/family education;Manual techniques;Dry needling;Aquatic Therapy;Electrical Stimulation;Iontophoresis 4mg /ml Dexamethasone;Gait training;Stair training    PT Next Visit Plan Knee A/AROM, hip and knee strengthening, gait training, manual techniques, modalities PRN    Consulted and Agree with Plan of Care Patient                          Thank you for your referral.   PT, DPT  06/02/2022, 4:57 PM

## 2022-06-11 ENCOUNTER — Other Ambulatory Visit: Payer: Self-pay

## 2022-06-15 ENCOUNTER — Telehealth: Payer: Self-pay | Admitting: Family

## 2022-06-15 ENCOUNTER — Other Ambulatory Visit: Payer: Self-pay

## 2022-06-15 DIAGNOSIS — I2699 Other pulmonary embolism without acute cor pulmonale: Secondary | ICD-10-CM

## 2022-06-15 DIAGNOSIS — D649 Anemia, unspecified: Secondary | ICD-10-CM

## 2022-06-15 MED ORDER — FERROUS SULFATE 325 (65 FE) MG PO TABS
325.0000 mg | ORAL_TABLET | Freq: Two times a day (BID) | ORAL | 1 refills | Status: DC
  Filled 2022-06-15: qty 60, 30d supply, fill #0
  Filled 2022-07-12: qty 60, 30d supply, fill #1

## 2022-06-16 ENCOUNTER — Other Ambulatory Visit: Payer: Self-pay

## 2022-06-16 NOTE — Telephone Encounter (Signed)
Pt need a refill on ELIQUIS and muscle relaxer sent to [facility]

## 2022-06-18 ENCOUNTER — Telehealth: Payer: Self-pay

## 2022-06-18 ENCOUNTER — Other Ambulatory Visit: Payer: Self-pay | Admitting: Family

## 2022-06-18 ENCOUNTER — Other Ambulatory Visit: Payer: Self-pay

## 2022-06-18 DIAGNOSIS — M5416 Radiculopathy, lumbar region: Secondary | ICD-10-CM

## 2022-06-18 MED ORDER — APIXABAN 5 MG PO TABS
5.0000 mg | ORAL_TABLET | Freq: Two times a day (BID) | ORAL | 3 refills | Status: DC
  Filled 2022-06-18: qty 60, 30d supply, fill #0
  Filled 2022-07-12: qty 60, 30d supply, fill #1
  Filled 2022-07-12: qty 180, 90d supply, fill #1
  Filled 2022-10-18: qty 180, 90d supply, fill #2
  Filled 2023-01-12 (×2): qty 180, 90d supply, fill #3
  Filled 2023-04-06: qty 120, 60d supply, fill #4

## 2022-06-18 MED ORDER — CYCLOBENZAPRINE HCL 10 MG PO TABS
5.0000 mg | ORAL_TABLET | Freq: Two times a day (BID) | ORAL | 1 refills | Status: DC | PRN
  Filled 2022-06-18: qty 30, 15d supply, fill #0
  Filled 2022-07-12: qty 30, 15d supply, fill #1

## 2022-06-18 NOTE — Telephone Encounter (Signed)
Rx sent in and pt is aware. 

## 2022-06-18 NOTE — Telephone Encounter (Signed)
Patient states her orthopedic doctor had given her medication for pain and muscle relaxer.  Patient states she is no longer seeing her orthopedic doctor since she has completed her physical therapy following her surgery.  Patient states [name], FNP, once prescribed cyclobenzaprine for her and she would like to see if she can get this again.  Traveling to [place] on Sunday and she does not have anything to take as a muscle relaxer.  Patient states she would like to have this today if possible.

## 2022-06-18 NOTE — Telephone Encounter (Signed)
Patient states her preferred pharmacy is [facility].  I did let patient know that normally she has to be seen before we prescribe a medication, but I let her know that I will go ahead and put this note in to [name], [place], for her.

## 2022-06-18 NOTE — Telephone Encounter (Signed)
Patient states she would like for [place] to please call her back today.

## 2022-06-18 NOTE — Telephone Encounter (Signed)
Refilled eliquis 

## 2022-06-18 NOTE — Addendum Note (Signed)
Addended by: Burnard Hawthorne on: 06/18/2022 08:41 AM   Modules accepted: Orders

## 2022-06-24 ENCOUNTER — Other Ambulatory Visit: Payer: Self-pay

## 2022-06-30 ENCOUNTER — Other Ambulatory Visit: Payer: Self-pay

## 2022-07-12 ENCOUNTER — Other Ambulatory Visit: Payer: Self-pay

## 2022-08-11 ENCOUNTER — Ambulatory Visit (INDEPENDENT_AMBULATORY_CARE_PROVIDER_SITE_OTHER): Payer: No Typology Code available for payment source | Admitting: Family Medicine

## 2022-08-11 ENCOUNTER — Other Ambulatory Visit: Payer: Self-pay

## 2022-08-11 ENCOUNTER — Ambulatory Visit: Payer: No Typology Code available for payment source

## 2022-08-11 ENCOUNTER — Encounter: Payer: Self-pay | Admitting: Family Medicine

## 2022-08-11 VITALS — BP 124/82 | HR 100 | Temp 99.0°F | Ht 66.0 in | Wt 236.4 lb

## 2022-08-11 DIAGNOSIS — R0602 Shortness of breath: Secondary | ICD-10-CM

## 2022-08-11 DIAGNOSIS — J40 Bronchitis, not specified as acute or chronic: Secondary | ICD-10-CM

## 2022-08-11 DIAGNOSIS — R059 Cough, unspecified: Secondary | ICD-10-CM

## 2022-08-11 LAB — POC COVID19 BINAXNOW: SARS Coronavirus 2 Ag: NEGATIVE

## 2022-08-11 MED ORDER — PREDNISONE 20 MG PO TABS
40.0000 mg | ORAL_TABLET | Freq: Every day | ORAL | 0 refills | Status: DC
  Filled 2022-08-11: qty 10, 5d supply, fill #0

## 2022-08-11 MED ORDER — DOXYCYCLINE HYCLATE 100 MG PO TABS
100.0000 mg | ORAL_TABLET | Freq: Two times a day (BID) | ORAL | 0 refills | Status: DC
  Filled 2022-08-11: qty 14, 7d supply, fill #0

## 2022-08-11 MED ORDER — ALBUTEROL SULFATE HFA 108 (90 BASE) MCG/ACT IN AERS
2.0000 | INHALATION_SPRAY | Freq: Four times a day (QID) | RESPIRATORY_TRACT | 0 refills | Status: DC | PRN
  Filled 2022-08-11: qty 6.7, 25d supply, fill #0

## 2022-08-11 NOTE — Assessment & Plan Note (Addendum)
Suspect bronchitis.  Point-of-care COVID testing negative.  Will get an x-ray today and treat with albuterol, prednisone, and doxycycline.  If not improving she will be reevaluated.  If she develops worsening shortness of breath, cough productive of blood, fevers, or chest pain she will seek medical attention in the emergency department.  Patient was counseled on the risk of skin sensitivity to the sun and diarrhea with doxycycline.  She is also counseled on the risk of sleep issues and excessive agitation with the prednisone.  If she develops either of those things she will let us know.

## 2022-08-11 NOTE — Patient Instructions (Signed)
Nice to see you. Will get a chest x-ray today and contact you with results. Will treat you for bronchitis with prednisone, doxycycline, and albuterol.  You can use the albuterol inhaler every 6 hours for the next 2 days and then every 6 hours as needed after that.  If you have significant trouble sleeping or you get excessively agitated on the prednisone please let us know.  You potentially could have diarrhea with the doxycycline and if that occurs please let us know.  Please wear good sun protection while on the doxycycline as it can cause skin sensitivity to the sun. If you develop chest pain, worsening shortness of breath, cough productive of blood, or fevers please be reevaluated right away.

## 2022-08-11 NOTE — Progress Notes (Signed)
Marikay Alar, MD Phone: [phone]  Leah Roberts is a 72 y.o. female who presents today for same-day visit.  Respiratory infection: Patient reports 3 days ago she developed significant cough with chest congestion and some sinus congestion with some headaches.  She notes cough started out as clear though has become creamy.  She notes some shortness of breath and tightness in her chest.  No fever.  She does note postnasal drip and some sore throat.  No taste or smell disturbances.  No known COVID exposures.  She does note her sister was sick with similar symptoms recently.  Patient has been using over-the-counter cough medication with good benefit last night.  Social History   Tobacco Use  Smoking Status Never  Smokeless Tobacco Never    Current Outpatient Medications on File Prior to Visit  Medication Sig Dispense Refill   apixaban (ELIQUIS) 5 MG TABS tablet Take 1 tablet (5 mg total) by mouth 2 (two) times daily. 180 tablet 3   calcium carbonate (TUMS - DOSED IN MG ELEMENTAL CALCIUM) 500 MG chewable tablet Chew 1 tablet by mouth daily as needed for indigestion or heartburn.     cyclobenzaprine (FLEXERIL) 10 MG tablet Take 0.5-1 tablets (5-10 mg total) by mouth 2 (two) times daily as needed for muscle spasms. 30 tablet 1   ferrous sulfate (FEROSUL) 325 (65 FE) MG tablet Take 1 tablet (325 mg total) by mouth 2 (two) times daily with a meal. 60 tablet 1   loratadine (CLARITIN) 10 MG tablet Take 10 mg by mouth daily.     Menthol, Topical Analgesic, (BIOFREEZE EX) Apply 1 Application topically daily as needed (leg pain).     Multiple Vitamin (MULTIVITAMIN WITH MINERALS) TABS tablet Take 1 tablet by mouth 2 (two) times a week.     rosuvastatin (CRESTOR) 5 MG tablet Take 1 tablet (5 mg total) by mouth at bedtime. 90 tablet 3   No current facility-administered medications on file prior to visit.     ROS see history of present illness  Objective  Physical Exam Vitals:    08/11/22 1534  BP: 124/82  Pulse: 100  Temp: 99 F (37.2 C)  SpO2: 98%    BP Readings from Last 3 Encounters:  08/11/22 124/82  05/19/22 126/82  03/09/22 134/87   Wt Readings from Last 3 Encounters:  08/11/22 236 lb 6.4 oz (107.2 kg)  05/19/22 229 lb (103.9 kg)  03/09/22 225 lb 12.8 oz (102.4 kg)    Physical Exam Constitutional:      General: She is not in acute distress.    Appearance: She is not diaphoretic.  Cardiovascular:     Rate and Rhythm: Normal rate and regular rhythm.     Heart sounds: Normal heart sounds.  Pulmonary:     Effort: Pulmonary effort is normal. No respiratory distress.     Breath sounds: Wheezing (Scattered expiratory wheezes) present.     Comments: Coarse breath sounds throughout Skin:    General: Skin is warm and dry.  Neurological:     Mental Status: She is alert.      Assessment/Plan: Please see individual problem list.  Bronchitis Assessment & Plan: Suspect bronchitis.  Point-of-care COVID testing negative.  Will get an x-ray today and treat with albuterol, prednisone, and doxycycline.  If not improving she will be reevaluated.  If she develops worsening shortness of breath, cough productive of blood, fevers, or chest pain she will seek medical attention in the emergency department.  Patient was counseled on the  risk of skin sensitivity to the sun and diarrhea with doxycycline.  She is also counseled on the risk of sleep issues and excessive agitation with the prednisone.  If she develops either of those things she will let us know.  Orders: -     DG Chest 2 View; Future -     predniSONE; Take 2 tablets (40 mg total) by mouth daily with breakfast.  Dispense: 10 tablet; Refill: 0 -     Albuterol Sulfate HFA; Inhale 2 puffs into the lungs every 6 (six) hours as needed for wheezing or shortness of breath.  Dispense: 6.7 g; Refill: 0 -     Doxycycline Hyclate; Take 1 tablet (100 mg total) by mouth 2 (two) times daily.  Dispense: 14 tablet;  Refill: 0  Cough, unspecified type -     POC COVID-19 BinaxNow  SOB (shortness of breath) -     POC COVID-19 BinaxNow     Return if symptoms worsen or fail to improve.   Marikay Alar, MD [facility]

## 2022-08-17 ENCOUNTER — Ambulatory Visit (INDEPENDENT_AMBULATORY_CARE_PROVIDER_SITE_OTHER): Payer: No Typology Code available for payment source | Admitting: Family Medicine

## 2022-08-17 ENCOUNTER — Other Ambulatory Visit: Payer: Self-pay

## 2022-08-17 ENCOUNTER — Encounter: Payer: Self-pay | Admitting: Family Medicine

## 2022-08-17 VITALS — BP 116/76 | HR 86 | Temp 98.3°F | Resp 16 | Ht 66.0 in | Wt 231.5 lb

## 2022-08-17 DIAGNOSIS — J40 Bronchitis, not specified as acute or chronic: Secondary | ICD-10-CM

## 2022-08-17 MED ORDER — TRELEGY ELLIPTA 200-62.5-25 MCG/ACT IN AEPB
1.0000 | INHALATION_SPRAY | Freq: Every day | RESPIRATORY_TRACT | 0 refills | Status: DC
Start: 2022-08-17 — End: 2023-03-10
  Filled 2022-08-17: qty 1, fill #0
  Filled 2022-08-18 – 2022-08-24 (×2): qty 60, fill #0
  Filled 2022-10-18 – 2023-01-12 (×2): qty 60, 30d supply, fill #0

## 2022-08-17 MED ORDER — AZITHROMYCIN 250 MG PO TABS
ORAL_TABLET | ORAL | 0 refills | Status: DC
Start: 2022-08-17 — End: 2022-08-23
  Filled 2022-08-17: qty 6, 5d supply, fill #0

## 2022-08-17 NOTE — Progress Notes (Signed)
Medication Samples have been provided to the patient.  Drug name: Trelegy       Strength: 200 mcg/62.66mcg/25 mcg        Qty: 1 LOT: [id number]  Exp.Date: 10/31/2023  Dosing instructions: 1 puff into the lungs daily   The patient has been instructed regarding the correct time, dose, and frequency of taking this medication, including desired effects and most common side effects.   Leah Roberts  Leah Roberts 3:49 PM 08/17/2022

## 2022-08-17 NOTE — Patient Instructions (Addendum)
It was a pleasure meeting you today. Thank you for allowing me to take part in your health care.  Our goals for today as we discussed include:  Start Trelegy Ellipta 1 puff daily Continue albuterol 1 to 2 puffs every 4 hours for 24 hours then as needed Continue prednisone 40 mg daily until medications completed Start Zithromax take 2 tablets today then 1 tablet daily Follow-up in 2 days  If any worsening symptoms, chest pain, increasing shortness of breath go to emergency department   Follow up in 2 days   If you have any questions or concerns, please do not hesitate to call the office at [phone].  I look forward to our next visit and until then take care and stay safe.  Regards,   Dana Allan, MD   [facility]

## 2022-08-17 NOTE — Progress Notes (Signed)
SUBJECTIVE:   Chief Complaint  Patient presents with   Cough   Wheezing    X couple of weeks Feels in her chest   HPI Patient scheduled for acute visit for wheezing and cough.  Was recently seen in clinic on 04/10 for respiratory infection.  Was prescribed prednisone, albuterol, doxycycline at that time.  Chest x-ray showed no active cardiopulmonary disease however did show prominent pulmonary arteries suggesting chronic pulmonary artery hypertension and some degree of chronic interstitial lung disease.  Was recommended to follow-up with PCP for further discussion.  She reports she continues to have wheezing and cough.  Feels like something is on her chest that she cannot get up.  She reports taking albuterol every 4-6 hours with some relief and feels a little better after this.  She reports that she has not started the prednisone that was previously prescribed on 04/10 as she had traveled to [place] in February and during that time had also been prescribed steroids.  She felt that because she had recently been on steroids there was no indication to continue another prescription.  Denies any fevers, chest pain, increased heart rate or palpitations.  Has bilateral lower extremity edema that is post left total knee replacement.   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  History of pulmonary thrombectomy  OBJECTIVE:  BP 116/76   Pulse 86   Temp 98.3 F (36.8 C)   Resp 16   Ht  (1.676 m)   Wt 231 lb 8 oz (105 kg)   SpO2 96%   BMI 37.37 kg/m    Physical Exam Vitals reviewed.  Constitutional:      General: She is not in acute distress.    Appearance: Normal appearance. She is not ill-appearing, toxic-appearing or diaphoretic.  HENT:     Right Ear: Tympanic membrane, ear canal and external ear normal. There is no impacted cerumen.     Left Ear: Tympanic membrane, ear canal and external ear normal. There is no impacted cerumen.      Nose: Nose normal. No congestion or rhinorrhea.     Mouth/Throat:     Mouth: Mucous membranes are moist.     Pharynx: Oropharynx is clear. No oropharyngeal exudate or posterior oropharyngeal erythema.  Eyes:     General:        Right eye: No discharge.        Left eye: No discharge.     Conjunctiva/sclera: Conjunctivae normal.  Cardiovascular:     Rate and Rhythm: Normal rate and regular rhythm.     Heart sounds: Normal heart sounds.  Pulmonary:     Effort: No respiratory distress.     Breath sounds: No stridor. Wheezing and rhonchi present.  Chest:     Chest wall: No tenderness.  Musculoskeletal:        General: Normal range of motion.  Lymphadenopathy:     Cervical: No cervical adenopathy.  Skin:    General: Skin is warm and dry.  Neurological:     General: No focal deficit present.     Mental Status: She is alert and oriented to person, place, and time. Mental status is at baseline.  Psychiatric:        Mood and Affect: Mood normal.        Behavior: Behavior normal.        Thought Content: Thought content normal.        Judgment: Judgment normal.  ASSESSMENT/PLAN:  Bronchitis Assessment & Plan: Low suspicion for PE given currently on anticoagulation with Eliquis, no chest pain or tachycardia.  She is hemodynamically stable and in no acute respiratory distress.  Action saturations 96% on room air. Recent COVID test negative Had not started prescribed steroid therapy. Has some improvement with albuterol inhaler and feeling a little better. Lung exam notable for wheezing and rhonchi throughout lung fields Will restart steroid therapy Start Z-Pak Start Trelegy Ellipta 1 puff daily Continue albuterol for shortness of breath or wheezing 2 puffs every 6 hours as needed  Follow-up in 2 days Strict return precautions provided. Consider pulmonology consult in future follow-up with PCP to discuss    Orders: -     Azithromycin; Take 2 tablets on day 1, then 1 tablet  daily on days 2 through 5  Dispense: 6 tablet; Refill: 0 -     Trelegy Ellipta; Inhale 1 puff into the lungs daily.  Dispense: 1 each; Refill: 0   PDMP reviewed  Return in about 2 days (around 08/19/2022).  Dana Allan, MD

## 2022-08-18 ENCOUNTER — Other Ambulatory Visit: Payer: Self-pay

## 2022-08-18 ENCOUNTER — Other Ambulatory Visit: Payer: Self-pay | Admitting: Family

## 2022-08-18 DIAGNOSIS — D649 Anemia, unspecified: Secondary | ICD-10-CM

## 2022-08-18 DIAGNOSIS — M5416 Radiculopathy, lumbar region: Secondary | ICD-10-CM

## 2022-08-18 NOTE — Telephone Encounter (Signed)
LVM to call back.

## 2022-08-19 ENCOUNTER — Other Ambulatory Visit: Payer: Self-pay

## 2022-08-19 ENCOUNTER — Ambulatory Visit: Payer: No Typology Code available for payment source | Admitting: Family Medicine

## 2022-08-19 ENCOUNTER — Other Ambulatory Visit: Payer: Self-pay | Admitting: Family

## 2022-08-19 DIAGNOSIS — D649 Anemia, unspecified: Secondary | ICD-10-CM

## 2022-08-19 MED ORDER — CYCLOBENZAPRINE HCL 10 MG PO TABS
5.0000 mg | ORAL_TABLET | Freq: Two times a day (BID) | ORAL | 1 refills | Status: DC | PRN
  Filled 2022-08-19: qty 30, 15d supply, fill #0
  Filled 2022-09-09: qty 30, 15d supply, fill #1

## 2022-08-22 ENCOUNTER — Encounter: Payer: Self-pay | Admitting: Family Medicine

## 2022-08-22 NOTE — Patient Instructions (Incomplete)
It was a pleasure meeting you today. Thank you for allowing me to take part in your health care.  Our goals for today as we discussed include:  Referral sent to pulmonology for further evaluation  Continue albuterol 2 puffs every 6 hours as needed for shortness of breath or wheezing Continue Trelegy Ellipta 1 puff daily.  Rinse mouth after using.  Glad you are feeling better   Recommend Shingles vaccine.  This is a 2 dose series and can be given at your local pharmacy.  Please talk to your pharmacist about this.   Recommend DEXA scan.   Referral sent for Mammogram. Please call to schedule appointment. [facility] [address] [place], [place] [address] [phone]    Follow-up PCP for continued management.  If you have any questions or concerns, please do not hesitate to call the office at [phone].  I look forward to our next visit and until then take care and stay safe.  Regards,   Dana Allan, MD   [facility]

## 2022-08-22 NOTE — Assessment & Plan Note (Addendum)
Low suspicion for PE given currently on anticoagulation with Eliquis, no chest pain or tachycardia.  She is hemodynamically stable and in no acute respiratory distress.  Action saturations 96% on room air. Recent COVID test negative Had not started prescribed steroid therapy. Has some improvement with albuterol inhaler and feeling a little better. Lung exam notable for wheezing and rhonchi throughout lung fields Will restart steroid therapy Start Z-Pak Start Trelegy Ellipta 1 puff daily Continue albuterol for shortness of breath or wheezing 2 puffs every 6 hours as needed  Follow-up in 2 days Strict return precautions provided. Consider pulmonology consult in future follow-up with PCP to discuss

## 2022-08-22 NOTE — Progress Notes (Signed)
SUBJECTIVE:   Chief Complaint  Patient presents with   Cough    Follow up    HPI Presents to clinic for follow-up recent visit for acute bronchitis.  She reports significant improvement in wheezing and cough.  Endorses using albuterol inhaler 2 times daily since initiation of Trelegy Ellipta.  Tolerating new inhaler well.  Endorses still has some cough but is significantly improved.  Denies any fevers, wheezing, chest pain, nausea or vomiting.  Endorses exertional shortness of breath x 1 month and typically increases with seasonal changes due to allergies.  Completed 5-day course of Zithromax.  Last dose yesterday Completed 5-day course of prednisone.  Last dose Friday.  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  Patient agreeable to referral.  No history of COPD, tobacco use, tuberculosis or asthma. Not currently on ACEi or ARB  Hyperlipidemia Patient requesting lipid profile today.  Reports had not been taking Crestor prior to last lipid profile.  Was instructed to restart medication.  Since that time patient has been taking Crestor 5 mg daily.  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  History of pulmonary thrombectomy  OBJECTIVE:  BP 124/82   Pulse 89   Temp 97.6 F (36.4 C)   Resp 16   Ht 5\' 6"  (1.676 m)   Wt 231 lb (104.8 kg)   SpO2 99%   BMI 37.28 kg/m    Physical Exam Vitals reviewed.  Constitutional:      General: She is not in acute distress.    Appearance: Normal appearance. She is normal weight. She is not ill-appearing, toxic-appearing or diaphoretic.  Eyes:     General:        Right eye: No discharge.        Left eye: No discharge.     Conjunctiva/sclera: Conjunctivae normal.  Cardiovascular:     Rate and Rhythm: Normal rate and regular  rhythm.     Heart sounds: Normal heart sounds.  Pulmonary:     Effort: Pulmonary effort is normal.     Breath sounds: Normal breath sounds.  Musculoskeletal:        General: Normal range of motion.  Skin:    General: Skin is warm and dry.  Neurological:     Mental Status: She is alert and oriented to person, place, and time. Mental status is at baseline.  Psychiatric:        Mood and Affect: Mood normal.        Behavior: Behavior normal.        Thought Content: Thought content normal.        Judgment: Judgment normal.     ASSESSMENT/PLAN:  Bronchitis Assessment & Plan: Significant improvement in wheezing since last visit. Completed course of steroids Completed Z-Pak Continue Trelegy elliptica 1 puff daily Continue albuterol 1 to 2 puffs every 6 hours as needed Referral sent to pulmonology   Shortness of breath Assessment & Plan: Reports exertional shortness of breath with walking. Recent echo shows LVEF 55-60%. Euvolemic on exam Recent chest x-ray did not show any pulmonary edema Referral sent to pulmonology Consider repeat echo.   Check CBC, ferritin Follow-up with PCP to discuss    Orders: -     Ambulatory referral to Pulmonology -     CBC with Differential/Platelet -     IBC + Ferritin  Mixed hyperlipidemia -     Lipid panel  Anemia, unspecified type -     CBC with Differential/Platelet -     IBC + Ferritin  Encounter for screening mammogram for malignant neoplasm of breast -     3D Screening Mammogram, Left and Right    PDMP reviewed  Return in about 1 month (around 09/22/2022), or if symptoms worsen or fail to improve.  Dana Allan, MD

## 2022-08-23 ENCOUNTER — Ambulatory Visit (INDEPENDENT_AMBULATORY_CARE_PROVIDER_SITE_OTHER): Payer: No Typology Code available for payment source | Admitting: Family Medicine

## 2022-08-23 ENCOUNTER — Encounter: Payer: Self-pay | Admitting: Family Medicine

## 2022-08-23 VITALS — BP 124/82 | HR 89 | Temp 97.6°F | Resp 16 | Ht 66.0 in | Wt 231.0 lb

## 2022-08-23 DIAGNOSIS — J40 Bronchitis, not specified as acute or chronic: Secondary | ICD-10-CM

## 2022-08-23 DIAGNOSIS — R0602 Shortness of breath: Secondary | ICD-10-CM

## 2022-08-23 DIAGNOSIS — E782 Mixed hyperlipidemia: Secondary | ICD-10-CM

## 2022-08-23 DIAGNOSIS — Z1231 Encounter for screening mammogram for malignant neoplasm of breast: Secondary | ICD-10-CM

## 2022-08-23 DIAGNOSIS — D649 Anemia, unspecified: Secondary | ICD-10-CM

## 2022-08-23 LAB — CBC WITH DIFFERENTIAL/PLATELET
Basophils Absolute: 0.1 10*3/uL (ref 0.0–0.1)
Basophils Relative: 0.6 % (ref 0.0–3.0)
Eosinophils Absolute: 0.3 10*3/uL (ref 0.0–0.7)
Eosinophils Relative: 3.3 % (ref 0.0–5.0)
HCT: 39.5 % (ref 36.0–46.0)
Hemoglobin: 12.8 g/dL (ref 12.0–15.0)
Lymphocytes Relative: 46.3 % — ABNORMAL HIGH (ref 12.0–46.0)
Lymphs Abs: 4.7 10*3/uL — ABNORMAL HIGH (ref 0.7–4.0)
MCHC: 32.3 g/dL (ref 30.0–36.0)
MCV: 85.4 fl (ref 78.0–100.0)
Monocytes Absolute: 0.7 10*3/uL (ref 0.1–1.0)
Monocytes Relative: 6.4 % (ref 3.0–12.0)
Neutro Abs: 4.4 10*3/uL (ref 1.4–7.7)
Neutrophils Relative %: 43.4 % (ref 43.0–77.0)
Platelets: 304 10*3/uL (ref 150.0–400.0)
RBC: 4.63 Mil/uL (ref 3.87–5.11)
RDW: 13.6 % (ref 11.5–15.5)
WBC: 10.2 10*3/uL (ref 4.0–10.5)

## 2022-08-23 LAB — LIPID PANEL
Cholesterol: 144 mg/dL (ref 0–200)
HDL: 39.9 mg/dL (ref >39.00)
LDL Cholesterol: 70 mg/dL (ref 0–99)
NonHDL: 103.78
Total CHOL/HDL Ratio: 4
Triglycerides: 170 mg/dL — ABNORMAL HIGH (ref 0.0–149.0)
VLDL: 34 mg/dL (ref 0.0–40.0)

## 2022-08-23 LAB — IBC + FERRITIN
Ferritin: 53.5 ng/mL (ref 10.0–291.0)
Iron: 54 ug/dL (ref 42–145)
Saturation Ratios: 17 % — ABNORMAL LOW (ref 20.0–50.0)
TIBC: 317.8 ug/dL (ref 250.0–450.0)
Transferrin: 227 mg/dL (ref 212.0–360.0)

## 2022-08-23 NOTE — Assessment & Plan Note (Addendum)
Significant improvement in wheezing since last visit. Completed course of steroids Completed Z-Pak Continue Trelegy elliptica 1 puff daily Continue albuterol 1 to 2 puffs every 6 hours as needed Referral sent to pulmonology

## 2022-08-24 ENCOUNTER — Other Ambulatory Visit: Payer: Self-pay | Admitting: Family

## 2022-08-24 ENCOUNTER — Other Ambulatory Visit: Payer: Self-pay

## 2022-08-24 DIAGNOSIS — D649 Anemia, unspecified: Secondary | ICD-10-CM

## 2022-08-25 ENCOUNTER — Other Ambulatory Visit: Payer: Self-pay

## 2022-08-26 ENCOUNTER — Other Ambulatory Visit: Payer: Self-pay

## 2022-08-26 ENCOUNTER — Other Ambulatory Visit: Payer: Self-pay | Admitting: Family

## 2022-08-26 DIAGNOSIS — D649 Anemia, unspecified: Secondary | ICD-10-CM

## 2022-08-27 ENCOUNTER — Other Ambulatory Visit: Payer: Self-pay

## 2022-08-27 MED FILL — Ferrous Sulfate Tab 325 MG (65 MG Elemental Fe): ORAL | 30 days supply | Qty: 60 | Fill #0 | Status: AC

## 2022-08-29 ENCOUNTER — Encounter: Payer: Self-pay | Admitting: Family Medicine

## 2022-08-29 DIAGNOSIS — Z1231 Encounter for screening mammogram for malignant neoplasm of breast: Secondary | ICD-10-CM | POA: Insufficient documentation

## 2022-08-29 DIAGNOSIS — Z0000 Encounter for general adult medical examination without abnormal findings: Secondary | ICD-10-CM | POA: Insufficient documentation

## 2022-08-29 DIAGNOSIS — R0602 Shortness of breath: Secondary | ICD-10-CM | POA: Insufficient documentation

## 2022-08-29 NOTE — Assessment & Plan Note (Addendum)
Reports exertional shortness of breath with walking. Recent echo shows LVEF 55-60%. Euvolemic on exam Recent chest x-ray did not show any pulmonary edema Referral sent to pulmonology Consider repeat echo.   Check CBC, ferritin Follow-up with PCP to discuss

## 2022-08-30 ENCOUNTER — Ambulatory Visit (INDEPENDENT_AMBULATORY_CARE_PROVIDER_SITE_OTHER)
Payer: No Typology Code available for payment source | Admitting: Student in an Organized Health Care Education/Training Program

## 2022-08-30 ENCOUNTER — Other Ambulatory Visit: Payer: Self-pay

## 2022-08-30 ENCOUNTER — Encounter: Payer: Self-pay | Admitting: Student in an Organized Health Care Education/Training Program

## 2022-08-30 VITALS — BP 110/80 | HR 101 | Temp 97.3°F | Ht 64.0 in | Wt 234.6 lb

## 2022-08-30 DIAGNOSIS — R0602 Shortness of breath: Secondary | ICD-10-CM

## 2022-08-30 NOTE — Progress Notes (Unsigned)
Synopsis: Referred in for shortness of breath by Dana Allan, MD  Assessment & Plan:   1. Shortness of breath  Presenting for the evaluation of shortness of breath, chronic, with subacute worsening after her last DVT/PE in August of 2023. The acute worsening in the setting of her URTI appears to have resolved.  Given patient's history of VTE, pulmonary vascular diseases such as CTEPH are on the differential, and I will tailor workup towards that. I will obtain a pulmonary function test to assess spirometry and DLCO, and also obtain a pulmonary perfusion scan to assess for risk of CTEPH. Furthermore, I will obtain a repeat echocardiogram to assess RV and LV function.  - Pulmonary Function Test [facility] Only; Future - NM Pulmonary Perfusion; Future - ECHOCARDIOGRAM COMPLETE; Future - DG Chest 2 View; Future  Return in about 3 months (around 11/29/2022).  I spent 45 minutes caring for this patient today, including preparing to see the patient, obtaining a medical history , reviewing a separately obtained history, performing a medically appropriate examination and/or evaluation, counseling and educating the patient/family/caregiver, ordering medications, tests, or procedures, documenting clinical information in the electronic health record, and independently interpreting results (not separately reported/billed) and communicating results to the patient/family/caregiver  Raechel Chute, MD [facility] 08/30/2022 10:51 PM    End of visit medications:  No orders of the defined types were placed in this encounter.    Current Outpatient Medications:    albuterol (VENTOLIN HFA) 108 (90 Base) MCG/ACT inhaler, Inhale 2 puffs into the lungs every 6 (six) hours as needed for wheezing or shortness of breath., Disp: 6.7 g, Rfl: 0   apixaban (ELIQUIS) 5 MG TABS tablet, Take 1 tablet (5 mg total) by mouth 2 (two) times daily., Disp: 180 tablet, Rfl: 3   calcium carbonate (TUMS - DOSED  IN MG ELEMENTAL CALCIUM) 500 MG chewable tablet, Chew 1 tablet by mouth daily as needed for indigestion or heartburn., Disp: , Rfl:    cyclobenzaprine (FLEXERIL) 10 MG tablet, Take 0.5-1 tablets (5-10 mg total) by mouth 2 (two) times daily as needed for muscle spasms., Disp: 30 tablet, Rfl: 1   ferrous sulfate (FEROSUL) 325 (65 FE) MG tablet, Take 1 tablet (325 mg total) by mouth 2 (two) times daily with a meal., Disp: 60 tablet, Rfl: 1   Fluticasone-Umeclidin-Vilant (TRELEGY ELLIPTA) 200-62.5-25 MCG/ACT AEPB, Inhale 1 puff into the lungs daily., Disp: 1 each, Rfl: 0   loratadine (CLARITIN) 10 MG tablet, Take 10 mg by mouth daily., Disp: , Rfl:    Menthol, Topical Analgesic, (BIOFREEZE EX), Apply 1 Application topically daily as needed (leg pain)., Disp: , Rfl:    Multiple Vitamin (MULTIVITAMIN WITH MINERALS) TABS tablet, Take 1 tablet by mouth 2 (two) times a week., Disp: , Rfl:    rosuvastatin (CRESTOR) 5 MG tablet, Take 1 tablet (5 mg total) by mouth at bedtime., Disp: 90 tablet, Rfl: 3   Subjective:   PATIENT ID: Stasha Burkhead GENDER: female DOB: 11/21/1950, MRN: [id number]  Chief Complaint  Patient presents with   Consult    Sob with exertion for 3 months since knee replacement August 2023), got a blood clot in leg.  Had filter placed.    HPI  Patient is a pleasant 72 year old female presenting to clinic for the evaluation of shortness of breath.   She reports that her dyspnea is at baseline and is secondary to having 3 DVT's in the past. She's been maintained on Eliquis for anti-coagulation, and  her last DVT was in the setting of total knee replacement. At that time, she was placed on Lovenox for bridge. She reports having shortness of breath following her DVT's that is at rest but worse with exertion.  Patient developed a URTI in early April, which resulted in increased shortness of breath and cough. The cough has resolved, and the shortness of breath is back to its baseline. She  does not have any chest pain, chest tightness, wheezing, productive cough, fevers, chills, night sweats, or weight loss.  Her left knee replacement was in August of 2023, at which point she had the PE, with echocardiography showing normal RV function. The CT suggested a dilated RV, and she underwent thrombectomy by vascular surgery. Her prior DVT's were in the setting of extended travel, with recurrence after discontinuation of anti-coagulation.   Patient is followed by Dr. Orlie Dakin and hypercoagulability workup is negative. She is also followed by Dr. Sandie Ano from cardiology for aortic atherosclerosis and minimal LAD calcifications, as well as hyperlipidemia.  She is originally from [place], where she has lived most of her life. Denies any smoking, and no biofuel exposure reported   Ancillary information including prior medications, full medical/surgical/family/social histories, and PFTs (when available) are listed below and have been reviewed.   Review of Systems  Constitutional:  Negative for chills, fever and weight loss.  Respiratory:  Positive for shortness of breath. Negative for cough, hemoptysis, sputum production and wheezing.   Cardiovascular:  Negative for chest pain.     Objective:   Vitals:   08/30/22 1535  BP: 110/80  Pulse: (!) 101  Temp: (!) 97.3 F (36.3 C)  TempSrc: Oral  SpO2: 97%  Weight: 234 lb 9.6 oz (106.4 kg)  Height: 5\' 4"  (1.626 m)   97% on RA BMI Readings from Last 3 Encounters:  08/30/22 40.27 kg/m  08/23/22 37.28 kg/m  08/17/22 37.37 kg/m   Wt Readings from Last 3 Encounters:  08/30/22 234 lb 9.6 oz (106.4 kg)  08/23/22 231 lb (104.8 kg)  08/17/22 231 lb 8 oz (105 kg)    Physical Exam Constitutional:      Appearance: She is obese.  Cardiovascular:     Rate and Rhythm: Normal rate and regular rhythm.     Pulses: Normal pulses.     Heart sounds: Normal heart sounds.  Pulmonary:     Effort: Pulmonary effort is normal. No respiratory  distress.     Breath sounds: Normal breath sounds. No wheezing or rales.  Neurological:     General: No focal deficit present.     Mental Status: She is alert and oriented to person, place, and time. Mental status is at baseline.       Ancillary Information    Past Medical History:  Diagnosis Date   Arthritis    DVT (deep venous thrombosis) (HCC) 2013   Dyspnea    due to knee pain   Family history of adverse reaction to anesthesia    Brother and sister hard to wake up.   GERD (gastroesophageal reflux disease)    History of blood transfusion    History of hypertension    Hypertension    Peripheral vascular disease (HCC)    DVT   Pre-diabetes    Pulmonary embolism (HCC) 2013     Family History  Problem Relation Age of Onset   Hypertension Father    Heart disease Father 28   Thyroid disease Sister    Allergies Sister    CVA Neg  Hx    Heart attack Neg Hx    High Cholesterol Neg Hx    Colon cancer Neg Hx      Past Surgical History:  Procedure Laterality Date   APPENDECTOMY     PULMONARY THROMBECTOMY Bilateral 01/01/2022   Procedure: PULMONARY THROMBECTOMY;  Surgeon: Renford Dills, MD;  Location: [facility] INVASIVE [facility];  Service: Cardiovascular;  Laterality: Bilateral;   TONSILLECTOMY AND ADENOIDECTOMY     TOTAL KNEE ARTHROPLASTY Left 12/09/2021   Procedure: LEFT TOTAL KNEE ARTHROPLASTY;  Surgeon: Nadara Mustard, MD;  Location: [facility] OR;  Service: Orthopedics;  Laterality: Left;    Social History   Socioeconomic History   Marital status: Widowed    Spouse name: Not on file   Number of children: Not on file   Years of education: Not on file   Highest education level: Not on file  Occupational History   Not on file  Tobacco Use   Smoking status: Never   Smokeless tobacco: Never  Substance and Sexual Activity   Alcohol use: Yes    Comment: Ocassional glass of wine   Drug use: No   Sexual activity: Not on file  Other Topics Concern   Not on file  Social  History Narrative   ** Merged History Encounter **       Social Determinants of Health   Financial Resource Strain: Not on file  Food Insecurity: Not on file  Transportation Needs: Not on file  Physical Activity: Not on file  Stress: Not on file  Social Connections: Not on file  Intimate Partner Violence: Not on file     Allergies  Allergen Reactions   Sulfa Antibiotics Swelling and Rash     CBC    Component Value Date/Time   WBC 10.2 08/23/2022 1019   RBC 4.63 08/23/2022 1019   HGB 12.8 08/23/2022 1019   HCT 39.5 08/23/2022 1019   PLT 304.0 08/23/2022 1019   MCV 85.4 08/23/2022 1019   MCH 25.9 (L) 01/04/2022 0413   MCHC 32.3 08/23/2022 1019   RDW 13.6 08/23/2022 1019   LYMPHSABS 4.7 (H) 08/23/2022 1019   MONOABS 0.7 08/23/2022 1019   EOSABS 0.3 08/23/2022 1019   BASOSABS 0.1 08/23/2022 1019    Pulmonary Functions Testing Results:     No data to display          Outpatient Medications Prior to Visit  Medication Sig Dispense Refill   albuterol (VENTOLIN HFA) 108 (90 Base) MCG/ACT inhaler Inhale 2 puffs into the lungs every 6 (six) hours as needed for wheezing or shortness of breath. 6.7 g 0   apixaban (ELIQUIS) 5 MG TABS tablet Take 1 tablet (5 mg total) by mouth 2 (two) times daily. 180 tablet 3   calcium carbonate (TUMS - DOSED IN MG ELEMENTAL CALCIUM) 500 MG chewable tablet Chew 1 tablet by mouth daily as needed for indigestion or heartburn.     cyclobenzaprine (FLEXERIL) 10 MG tablet Take 0.5-1 tablets (5-10 mg total) by mouth 2 (two) times daily as needed for muscle spasms. 30 tablet 1   ferrous sulfate (FEROSUL) 325 (65 FE) MG tablet Take 1 tablet (325 mg total) by mouth 2 (two) times daily with a meal. 60 tablet 1   Fluticasone-Umeclidin-Vilant (TRELEGY ELLIPTA) 200-62.5-25 MCG/ACT AEPB Inhale 1 puff into the lungs daily. 1 each 0   loratadine (CLARITIN) 10 MG tablet Take 10 mg by mouth daily.     Menthol, Topical Analgesic, (BIOFREEZE EX) Apply 1  Application topically  daily as needed (leg pain).     Multiple Vitamin (MULTIVITAMIN WITH MINERALS) TABS tablet Take 1 tablet by mouth 2 (two) times a week.     rosuvastatin (CRESTOR) 5 MG tablet Take 1 tablet (5 mg total) by mouth at bedtime. 90 tablet 3   No facility-administered medications prior to visit.

## 2022-09-01 ENCOUNTER — Encounter
Admission: RE | Admit: 2022-09-01 | Discharge: 2022-09-01 | Disposition: A | Discharge status: Home / Self Care | Payer: No Typology Code available for payment source | Source: Ambulatory Visit | Attending: Student in an Organized Health Care Education/Training Program | Admitting: Student in an Organized Health Care Education/Training Program

## 2022-09-01 ENCOUNTER — Ambulatory Visit
Admission: RE | Admit: 2022-09-01 | Discharge: 2022-09-01 | Disposition: A | Discharge status: Home / Self Care | Payer: No Typology Code available for payment source | Source: Ambulatory Visit | Attending: Student in an Organized Health Care Education/Training Program | Admitting: Student in an Organized Health Care Education/Training Program

## 2022-09-01 ENCOUNTER — Telehealth: Payer: Self-pay | Admitting: Student in an Organized Health Care Education/Training Program

## 2022-09-01 DIAGNOSIS — I2724 Chronic thromboembolic pulmonary hypertension: Secondary | ICD-10-CM

## 2022-09-01 DIAGNOSIS — R0602 Shortness of breath: Secondary | ICD-10-CM

## 2022-09-01 MED ORDER — TECHNETIUM TO 99M ALBUMIN AGGREGATED
4.0000 | Freq: Once | INTRAVENOUS | Status: AC | PRN
  Administered 2022-09-01: 4.29 via INTRAVENOUS

## 2022-09-01 NOTE — Telephone Encounter (Signed)
I spoke with Huntley Dec from [facility], she said the Radiologist was in the process of reading the Pulmonary Perfusion study and wanted her to call and give a call report. She said the Radiologist was reading the study as Abnormal Lung Scan with indeterminate findings. She said he was reading it as indeterminate for pulmonary embolism and a CTA chest may be helpful for further evaluation. She said they were holding the patient until Dr. Aundria Rud reviewed the study.  I have notified Dr. Aundria Rud. Per Dr. Aundria Rud, the patient has a history of PE. He ordered the test to assess for a complication of chronic PE (something called CTEPH). He wants a Urgent referral placed to advanced heart failure (Dr. Gala Romney, Dr. Gasper Lloyd or Dr. Shirlee Latch).  I have placed the order.  ATC the patient. LVM for the patient to return my call.

## 2022-09-02 ENCOUNTER — Telehealth: Payer: Self-pay | Admitting: Student in an Organized Health Care Education/Training Program

## 2022-09-02 NOTE — Telephone Encounter (Signed)
I have sent the patient a patient advise request and left her another message to return my call. I will place a letter in the mail letting her know about the referral.  Nothing further needed.

## 2022-09-02 NOTE — Telephone Encounter (Signed)
I have notified the patient of her Pulmonary Perfusion Study results. She is aware that Dr. Aundria Rud wants to refer her to a Cardiologist and that they will contact her with an appt.  Nothing further needed.

## 2022-09-03 ENCOUNTER — Telehealth: Payer: Self-pay

## 2022-09-03 NOTE — Telephone Encounter (Signed)
Attempted to call pt to schedule her initial appointment with Foster Simpson.  No answer, left voice mail with clinic number.

## 2022-09-10 ENCOUNTER — Ambulatory Visit
Admission: RE | Admit: 2022-09-10 | Discharge: 2022-09-10 | Disposition: A | Discharge status: Home / Self Care | Payer: No Typology Code available for payment source | Source: Ambulatory Visit | Attending: Student in an Organized Health Care Education/Training Program | Admitting: Student in an Organized Health Care Education/Training Program

## 2022-09-10 ENCOUNTER — Telehealth: Payer: Self-pay | Admitting: Family

## 2022-09-10 ENCOUNTER — Other Ambulatory Visit: Payer: Self-pay

## 2022-09-10 DIAGNOSIS — R0602 Shortness of breath: Secondary | ICD-10-CM

## 2022-09-10 DIAGNOSIS — I2699 Other pulmonary embolism without acute cor pulmonale: Secondary | ICD-10-CM | POA: Insufficient documentation

## 2022-09-10 DIAGNOSIS — I10 Essential (primary) hypertension: Secondary | ICD-10-CM | POA: Insufficient documentation

## 2022-09-10 DIAGNOSIS — J90 Pleural effusion, not elsewhere classified: Secondary | ICD-10-CM | POA: Insufficient documentation

## 2022-09-10 LAB — ECHOCARDIOGRAM COMPLETE
AR max vel: 2.15 cm2
AV Area VTI: 2.53 cm2
AV Area mean vel: 2.44 cm2
AV Mean grad: 3 mmHg
AV Peak grad: 5.8 mmHg
Ao pk vel: 1.2 m/s
Area-P 1/2: 4.44 cm2
S' Lateral: 3 cm

## 2022-09-10 NOTE — Telephone Encounter (Signed)
Patient came to pick up a letter from [place]. Letter was not up front. Please call patient when letter is ready to be picked up.

## 2022-09-10 NOTE — Progress Notes (Signed)
*  PRELIMINARY RESULTS* Echocardiogram 2D Echocardiogram has been performed.  Cristela Blue 09/10/2022, 11:39 AM

## 2022-09-16 ENCOUNTER — Other Ambulatory Visit: Payer: Self-pay

## 2022-09-16 NOTE — Telephone Encounter (Signed)
Spoke to pt and informed her that she can pick up letter anytime between 9 am -5 pm M-F . Letter placed in brown  folder up front

## 2022-09-20 ENCOUNTER — Institutional Professional Consult (permissible substitution)
Payer: No Typology Code available for payment source | Admitting: Student in an Organized Health Care Education/Training Program

## 2022-09-21 ENCOUNTER — Telehealth: Payer: Self-pay

## 2022-09-21 NOTE — Telephone Encounter (Signed)
LETTER SENT TO PATIENT

## 2022-09-22 ENCOUNTER — Encounter: Payer: Self-pay | Admitting: Cardiology

## 2022-09-22 ENCOUNTER — Other Ambulatory Visit
Admission: RE | Admit: 2022-09-22 | Discharge: 2022-09-22 | Disposition: A | Discharge status: Home / Self Care | Payer: Self-pay | Source: Ambulatory Visit | Attending: Cardiology | Admitting: Cardiology

## 2022-09-22 ENCOUNTER — Ambulatory Visit (HOSPITAL_BASED_OUTPATIENT_CLINIC_OR_DEPARTMENT_OTHER): Payer: No Typology Code available for payment source | Admitting: Cardiology

## 2022-09-22 VITALS — BP 128/96 | HR 88 | Wt 235.6 lb

## 2022-09-22 DIAGNOSIS — I200 Unstable angina: Secondary | ICD-10-CM

## 2022-09-22 DIAGNOSIS — I50810 Right heart failure, unspecified: Secondary | ICD-10-CM

## 2022-09-22 LAB — BASIC METABOLIC PANEL
Anion gap: 5 (ref 5–15)
BUN: 15 mg/dL (ref 8–23)
CO2: 22 mmol/L (ref 22–32)
Calcium: 8.7 mg/dL — ABNORMAL LOW (ref 8.9–10.3)
Chloride: 111 mmol/L (ref 98–111)
Creatinine, Ser: 0.95 mg/dL (ref 0.44–1.00)
GFR, Estimated: >60 mL/min (ref >60)
Glucose, Bld: 94 mg/dL (ref 70–99)
Potassium: 3.8 mmol/L (ref 3.5–5.1)
Sodium: 138 mmol/L (ref 135–145)

## 2022-09-22 LAB — CBC
HCT: 38.3 % (ref 36.0–46.0)
Hemoglobin: 12.4 g/dL (ref 12.0–15.0)
MCH: 27.4 pg (ref 26.0–34.0)
MCHC: 32.4 g/dL (ref 30.0–36.0)
MCV: 84.7 fL (ref 80.0–100.0)
Platelets: 243 10*3/uL (ref 150–400)
RBC: 4.52 MIL/uL (ref 3.87–5.11)
RDW: 13.9 % (ref 11.5–15.5)
WBC: 6.3 10*3/uL (ref 4.0–10.5)
nRBC: 0 % (ref 0.0–0.2)

## 2022-09-22 NOTE — Progress Notes (Signed)
ADVANCED HEART FAILURE CLINIC NOTE  Referring Physician: Raechel Chute, MD  Primary Care: Allegra Grana, FNP Primary Cardiologist: N/A  HPI: Leah Roberts is a 72 y.o. female with pulmonary embolism in 2014, 2023 status post thrombectomy on apixaban, history of DVT (x3) in 2013, hyperlipidemia, total knee replacement presenting today to establish care.  She was most recently seen in [facility] in late 2023 for shortness of breath after missing a few doses of Eliquis.  Chest CT at that time demonstrated bilateral PEs with RV strain.  She underwent thrombectomy by vascular surgery with follow-up echocardiogram demonstrating preserved RV and LV function.  From a functional standpoint, while resting she has no shortness of breath; also no PND or orthopnea. However, with mild exertion she becomes quickly short of breath. She reports becoming SOB when walking from her bedroom to the kitchen or from the front door to her mailbox (roughly 30-20ft). No dizziness; mild lightheadedness; no syncopal events. No significant LE edema. She is planning to have a right knee replacement. 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    Past Medical History:  Diagnosis Date   Arthritis    DVT (deep venous thrombosis) (HCC) 2013   Dyspnea    due to knee pain   Family history of adverse reaction to anesthesia    Brother and sister hard to wake up.   GERD (gastroesophageal reflux disease)    History of blood transfusion    History of hypertension    Hypertension    Peripheral vascular disease (HCC)    DVT   Pre-diabetes    Pulmonary embolism (HCC) 2013    Current Outpatient Medications  Medication Sig Dispense Refill   albuterol (VENTOLIN HFA) 108 (90 Base) MCG/ACT inhaler Inhale 2 puffs into the lungs every 6 (six) hours as needed for wheezing or shortness of breath. 6.7 g 0   apixaban (ELIQUIS) 5 MG TABS tablet Take 1 tablet (5 mg total) by  mouth 2 (two) times daily. 180 tablet 3   calcium carbonate (TUMS - DOSED IN MG ELEMENTAL CALCIUM) 500 MG chewable tablet Chew 1 tablet by mouth daily as needed for indigestion or heartburn.     ferrous sulfate (FEROSUL) 325 (65 FE) MG tablet Take 1 tablet (325 mg total) by mouth 2 (two) times daily with a meal. 60 tablet 1   Fluticasone-Umeclidin-Vilant (TRELEGY ELLIPTA) 200-62.5-25 MCG/ACT AEPB Inhale 1 puff into the lungs daily. 60 each 0   loratadine (CLARITIN) 10 MG tablet Take 10 mg by mouth daily.     Menthol, Topical Analgesic, (BIOFREEZE EX) Apply 1 Application topically daily as needed (leg pain).     Multiple Vitamin (MULTIVITAMIN WITH MINERALS) TABS tablet Take 1 tablet by mouth 2 (two) times a week.     rosuvastatin (CRESTOR) 5 MG tablet Take 1 tablet (5 mg total) by mouth at bedtime. 90 tablet 3   cyclobenzaprine (FLEXERIL) 10 MG tablet Take 0.5-1 tablets (5-10 mg total) by mouth 2 (two) times daily as needed for muscle spasms. 30 tablet 2   predniSONE (DELTASONE) 10 MG tablet Take 1 tablet (10 mg total) by mouth daily with breakfast. 30 tablet 0   No current facility-administered medications for this visit.    Allergies  Allergen Reactions   Sulfa Antibiotics Swelling and Rash      Social History   Socioeconomic History   Marital status: Widowed    Spouse name: Not on file  Number of children: Not on file   Years of education: Not on file   Highest education level: Not on file  Occupational History   Not on file  Tobacco Use   Smoking status: Never   Smokeless tobacco: Never  Substance and Sexual Activity   Alcohol use: Yes    Comment: Ocassional glass of wine   Drug use: No   Sexual activity: Not on file  Other Topics Concern   Not on file  Social History Narrative   ** Merged History Encounter **       Social Determinants of Health   Financial Resource Strain: Not on file  Food Insecurity: Not on file  Transportation Needs: Not on file  Physical  Activity: Not on file  Stress: Not on file  Social Connections: Not on file  Intimate Partner Violence: Not on file      Family History  Problem Relation Age of Onset   Hypertension Father    Heart disease Father 52   Thyroid disease Sister    Allergies Sister    CVA Neg Hx    Heart attack Neg Hx    High Cholesterol Neg Hx    Colon cancer Neg Hx     PHYSICAL EXAM: Vitals:   09/22/22 1338  BP: (!) 128/96  Pulse: 88  SpO2: 97%   GENERAL: Well nourished, well developed, and in no apparent distress at rest.  HEENT: Negative for arcus senilis or xanthelasma. There is no scleral icterus.  The mucous membranes are pink and moist.   NECK: Supple, No masses. Normal carotid upstrokes without bruits. No masses or thyromegaly.    CHEST: There are no chest wall deformities. There is no chest wall tenderness. Respirations are unlabored.  Lungs- decreased at bases CARDIAC:  JVP: 8 cm H2O         Normal S1, S2  Normal rate with regular rhythm. No murmurs, rubs or gallops.  Pulses are 2+ and symmetrical in upper and lower extremities. No edema.  ABDOMEN: Soft, non-tender, non-distended. There are no masses or hepatomegaly. There are normal bowel sounds.  EXTREMITIES: Warm and well perfused with no cyanosis, clubbing.  LYMPHATIC: No axillary or supraclavicular lymphadenopathy.  NEUROLOGIC: Patient is oriented x3 with no focal or lateralizing neurologic deficits.  PSYCH: Patients affect is appropriate, there is no evidence of anxiety or depression.  SKIN: Warm and dry; no lesions or wounds.   DATA REVIEW  ECG: 03/09/22: NSR as per my personal interpretation  ECHO: 09/10/22: LVEF 60-65%, grade 1 DD. Mildly reduced RV function.   ASSESSMENT & PLAN:  Recurrent pulmonary embolisms with RV dysfunction - TTE from 5/24 with some RV dysfunction on limited views.  - Patient has a history of DVTs & PE in 2014 and 2023 with RV strain on CT (now s/p thrombectomy); personally reviewed CT from time  of her PE. LV:RV, contrast reflux into IVC and dilation of hepatic veins is all consistent with RV dysfunction.  - V/Q scan on 09/01/22 w/ abnormal perfusion in both lungs; scattered subsegmental perfusion defects.  - Due to persistent SOB and mild RV dysfunction will plan on RHC to rule out CTEPH.  - PFTs pending - Hypercoagulable work up negative (Dr. Orlie Dakin)  Dorthula Nettles Advanced Heart Failure Mechanical Circulatory Support

## 2022-09-22 NOTE — Patient Instructions (Signed)
Medication Changes:  No medication changes  Lab Work:  Labs done today, your results will be available in MyChart, we will contact you for abnormal readings.   Testing/Procedures:  Your physician has requested that you have a cardiac catheterization. Cardiac catheterization is used to diagnose and/or treat various heart conditions. Doctors may recommend this procedure for a number of different reasons. The most common reason is to evaluate chest pain. Chest pain can be a symptom of coronary artery disease (CAD), and cardiac catheterization can show whether plaque is narrowing or blocking your heart's arteries. This procedure is also used to evaluate the valves, as well as measure the blood flow and oxygen levels in different parts of your heart. For further information please visit [email or web address] Please follow instruction sheet, as given.    You are scheduled for a Cardiac Catheterization on Friday, May 31 with Dr. Gasper Lloyd.  1. Please arrive at the [facility] (Main Entrance A) at [facility]: [address] [place], [place] [address] at 8:00 AM (This time is 2 hour(s) before your procedure to ensure your preparation). Free valet parking service is available. You will check in at ADMITTING. The support person will be asked to wait in the waiting room.  It is OK to have someone drop you off and come back when you are ready to be discharged.    Special note: Every effort is made to have your procedure done on time. Please understand that emergencies sometimes delay scheduled procedures.  2. Diet: Do not eat solid foods after midnight.  The patient may have clear liquids until 5am upon the day of the procedure.   4. Medication instructions in preparation for your procedure:   Contrast Allergy: No  Please hold all of your morning medications the day of your procedure. You may take your evening medications and resume your medications the next day of your procedure.   5. Plan  to go home the same day, you will only stay overnight if medically necessary. 6. Bring a current list of your medications and current insurance cards. 7. You MUST have a responsible person to drive you home. 8. Someone MUST be with you the first 24 hours after you arrive home or your discharge will be delayed. 9. Please wear clothes that are easy to get on and off and wear slip-on shoes.   Special Instructions // Education: Do the following things EVERYDAY: Weigh yourself in the morning before breakfast. Write it down and keep it in a log. Take your medicines as prescribed Eat low salt foods--Limit salt (sodium) to 2000 mg per day.  Stay as active as you can everyday Limit all fluids for the day to less than 2 liters   Follow-Up in: please call so we can schedule you next appointment after your heart cath.    If you have any questions or concerns before your next appointment please send [place] a message through Amo or call our office at [phone] Monday-Friday 8 am-5 pm.   If you have an urgent need after hours on the weekend please call your Primary Cardiologist or the [facility] in [place] at [phone].

## 2022-09-28 ENCOUNTER — Other Ambulatory Visit: Payer: Self-pay

## 2022-09-28 ENCOUNTER — Other Ambulatory Visit: Payer: Self-pay | Admitting: Family

## 2022-09-28 ENCOUNTER — Telehealth (HOSPITAL_COMMUNITY): Payer: Self-pay

## 2022-09-28 DIAGNOSIS — M5416 Radiculopathy, lumbar region: Secondary | ICD-10-CM

## 2022-09-28 MED ORDER — CYCLOBENZAPRINE HCL 10 MG PO TABS
5.0000 mg | ORAL_TABLET | Freq: Two times a day (BID) | ORAL | 2 refills | Status: DC | PRN
Start: 2022-09-28 — End: 2023-01-12
  Filled 2022-09-28: qty 30, 15d supply, fill #0
  Filled 2022-10-18: qty 30, 15d supply, fill #1
  Filled 2022-11-02: qty 30, 15d supply, fill #2

## 2022-09-28 MED FILL — Ferrous Sulfate Tab 325 MG (65 MG Elemental Fe): ORAL | 30 days supply | Qty: 60 | Fill #1 | Status: AC

## 2022-09-28 NOTE — Telephone Encounter (Signed)
Last filled on 08/19/22 #30 tabs/ 1 refill, f/u scheduled on 11/24/22

## 2022-09-28 NOTE — Telephone Encounter (Signed)
Patient called and wants to cancel procedures for Friday. She is going to have her knee replacement surgery instead.

## 2022-09-28 NOTE — Telephone Encounter (Signed)
Pt walked in to pick up letter at front.

## 2022-10-01 ENCOUNTER — Encounter (HOSPITAL_COMMUNITY): Admission: RE | Payer: Self-pay | Source: Ambulatory Visit

## 2022-10-01 ENCOUNTER — Ambulatory Visit (HOSPITAL_COMMUNITY)
Admission: RE | Admit: 2022-10-01 | Payer: No Typology Code available for payment source | Source: Ambulatory Visit | Admitting: Cardiology

## 2022-10-01 SURGERY — RIGHT HEART CATH
Anesthesia: LOCAL

## 2022-10-01 NOTE — Telephone Encounter (Signed)
noted 

## 2022-10-06 ENCOUNTER — Telehealth: Payer: Self-pay | Admitting: Orthopedic Surgery

## 2022-10-06 NOTE — Telephone Encounter (Signed)
Patient stating left knee that she had replaced 12/09/2021 is giving her tremendous pain she states she cannot sleep at night and has set an appt to [facility] Next avail but is wondering if she may get some pain meds until her upcoming appt on 10/14/2022, please advise

## 2022-10-07 NOTE — Telephone Encounter (Signed)
Called pt and offered appt to come in tomorrow for eval and pt declined due to transportation. Offered appt on Monday and declined accepted appt on Tuesday at 2 pm. Advised that we are happy to see her in the office earlier to assess her TKR pain ( almost a year out) pt states she can not come in until Tuesday.

## 2022-10-12 ENCOUNTER — Other Ambulatory Visit (INDEPENDENT_AMBULATORY_CARE_PROVIDER_SITE_OTHER): Payer: Self-pay

## 2022-10-12 ENCOUNTER — Ambulatory Visit (INDEPENDENT_AMBULATORY_CARE_PROVIDER_SITE_OTHER): Payer: Self-pay | Admitting: Orthopedic Surgery

## 2022-10-12 ENCOUNTER — Other Ambulatory Visit: Payer: Self-pay

## 2022-10-12 DIAGNOSIS — M5442 Lumbago with sciatica, left side: Secondary | ICD-10-CM

## 2022-10-12 DIAGNOSIS — M25562 Pain in left knee: Secondary | ICD-10-CM

## 2022-10-12 DIAGNOSIS — Z96652 Presence of left artificial knee joint: Secondary | ICD-10-CM

## 2022-10-12 DIAGNOSIS — G8929 Other chronic pain: Secondary | ICD-10-CM

## 2022-10-12 DIAGNOSIS — M5440 Lumbago with sciatica, unspecified side: Secondary | ICD-10-CM

## 2022-10-12 MED ORDER — PREDNISONE 10 MG PO TABS
10.0000 mg | ORAL_TABLET | Freq: Every day | ORAL | 0 refills | Status: DC
  Filled 2022-10-12: qty 30, 30d supply, fill #0

## 2022-10-14 ENCOUNTER — Ambulatory Visit: Payer: No Typology Code available for payment source | Admitting: Orthopedic Surgery

## 2022-10-18 ENCOUNTER — Other Ambulatory Visit: Payer: Self-pay

## 2022-10-19 ENCOUNTER — Other Ambulatory Visit: Payer: Self-pay

## 2022-10-19 ENCOUNTER — Other Ambulatory Visit: Payer: Self-pay | Admitting: Family

## 2022-10-19 DIAGNOSIS — D649 Anemia, unspecified: Secondary | ICD-10-CM

## 2022-10-20 ENCOUNTER — Other Ambulatory Visit: Payer: Self-pay

## 2022-10-20 ENCOUNTER — Encounter: Payer: Self-pay | Admitting: Orthopedic Surgery

## 2022-10-20 MED ORDER — FERROUS SULFATE 325 (65 FE) MG PO TABS
325.0000 mg | ORAL_TABLET | Freq: Two times a day (BID) | ORAL | 1 refills | Status: DC
Start: 2022-10-20 — End: 2023-03-15
  Filled 2022-10-20: qty 60, 30d supply, fill #0
  Filled 2023-01-12: qty 60, 30d supply, fill #1

## 2022-10-20 NOTE — Progress Notes (Signed)
Office Visit Note   Patient: Leah Roberts           Date of Birth: [birth year]           MRN: [id number] Visit Date: 10/12/2022              Requested by: Allegra Grana, FNP [address] [place],  [place] [address] PCP: Allegra Grana, FNP  Chief Complaint  Patient presents with   Left Knee - Pain    Hx left TKA   Left Leg - Numbness, Pain      HPI: Patient is a 72 year old woman who presents with 2 separate issues.  She complains of lower back pain with numbness and tingling radiating to the left lateral leg and left foot for 3 weeks.  She states she has minimal back pain.  Patient also has right knee pain status post total knee arthroplasty on the left.  Assessment & Plan: Visit Diagnoses:  1. Chronic pain of left knee   2. Acute left-sided low back pain with left-sided sciatica   3. Low back pain with sciatica, sciatica laterality unspecified, unspecified back pain laterality, unspecified chronicity   4. Status post total left knee replacement     Plan: A prescription was provided for prednisone 10 mg to see if this will help resolve her radicular symptoms.  Patient states he would like to consider total knee arthroplasty on the right knee possibly in September.  Obtain 2 view radiographs of the right knee at follow-up.  Follow-Up Instructions: Return in about 4 weeks (around 11/09/2022).   Ortho Exam  Patient is alert, oriented, no adenopathy, well-dressed, normal affect, normal respiratory effort. Examination patient has a positive straight leg raise on the left motor strength is 5/5.  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  Radiographs of the left knee shows stable total knee arthroplasty.  Previous radiographs of the right knee shows tricompartmental arthritic changes with varus collapse.  Will need new radiographs of the right knee prior to surgery.  Imaging: No results found. No images are attached to the  encounter.  Labs: Lab Results  Component Value Date   HGBA1C 6.7 (H) 05/19/2022   HGBA1C 6.2 02/15/2018   HGBA1C 6.2 10/05/2017   ESRSEDRATE 10 05/29/2020   CRP <1.0 05/29/2020     Lab Results  Component Value Date   ALBUMIN 4.0 03/08/2022   ALBUMIN 3.7 01/13/2022   ALBUMIN 3.7 12/30/2021    Lab Results  Component Value Date   MG 2.0 01/04/2022   MG 2.0 01/03/2022   MG 1.8 01/02/2022   Lab Results  Component Value Date   VD25OH 21.43 (L) 10/05/2017    No results found for: "PREALBUMIN"    Latest Ref Rng & Units 09/22/2022    2:52 PM 08/23/2022   10:19 AM 05/19/2022    2:33 PM  CBC EXTENDED  WBC 4.0 - 10.5 K/uL 6.3  10.2  7.0   RBC 3.87 - 5.11 MIL/uL 4.52  4.63  4.41   Hemoglobin 12.0 - 15.0 g/dL 19.1  47.8  29.5   HCT 36.0 - 46.0 % 38.3  39.5  36.7   Platelets 150 - 400 K/uL 243  304.0  244.0   NEUT# 1.4 - 7.7 K/uL  4.4  3.8   Lymph# 0.7 - 4.0 K/uL  4.7  2.6      There is no height or weight on file to calculate BMI.  Orders:  Orders Placed This Encounter  Procedures   XR Knee 1-2 Views Left   XR Lumbar Spine 2-3 Views   Meds ordered this encounter  Medications   predniSONE (DELTASONE) 10 MG tablet    Sig: Take 1 tablet (10 mg total) by mouth daily with breakfast.    Dispense:  30 tablet    Refill:  0     Procedures: No procedures performed  Clinical Data: No additional findings.  ROS:  All other systems negative, except as noted in the HPI. Review of Systems  Objective: Vital Signs: There were no vitals taken for this visit.  Specialty Comments:  No specialty comments available.  PMFS History: Patient Active Problem List   Diagnosis Date Noted   Shortness of breath 08/29/2022   Encounter for screening mammogram for malignant neoplasm of breast 08/29/2022   Right groin pain 01/13/2022   Bilateral pulmonary embolism with right heart strain RV/LV ratio 1:5 (HCC) 12/31/2021   Acute respiratory failure with hypoxia (HCC) 12/31/2021    Total knee replacement status, left 12/09/21 12/09/2021   Unilateral primary osteoarthritis, left knee    Shoulder disorder 09/03/2021   Arthritis of knee 09/03/2021   Lumbar radiculopathy 09/03/2021   Abnormal MRI, knee 09/03/2021   Chronic pain of right knee 09/03/2021   Bronchitis 03/25/2021   Anemia 01/09/2020   HTN (hypertension) 12/31/2019   Chest pain 12/31/2019   DOE (dyspnea on exertion) 12/31/2019   Acute coronary syndrome (HCC) 12/31/2019   Unstable angina (HCC) 12/31/2019   Atherosclerosis 08/23/2018   Screening for tuberculosis 07/12/2018   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   Arthralgia 07/12/2018   Solitary pulmonary nodule 07/12/2018   Neck pain 02/15/2018   Dizziness 02/15/2018   Elevated TSH 02/15/2018   Screening for HIV (human immunodeficiency virus) 10/05/2017   Neuropathy 10/05/2017   Screening for colon cancer 10/05/2017   Hyperlipidemia 08/04/2016   Prediabetes 08/04/2016   Back pain 08/04/2016   Bilateral knee pain 06/18/2016   History of DVT of lower extremity 04/01/2014   History of DVT and PE (deep vein thrombosis) 05/04/2011   Past Medical History:  Diagnosis Date   Arthritis    DVT (deep venous thrombosis) (HCC) 2013   Dyspnea    due to knee pain   Family history of adverse reaction to anesthesia    Brother and sister hard to wake up.   GERD (gastroesophageal reflux disease)    History of blood transfusion    History of hypertension    Hypertension    Peripheral vascular disease (HCC)    DVT   Pre-diabetes    Pulmonary embolism (HCC) 2013    Family History  Problem Relation Age of Onset   Hypertension Father    Heart disease Father 86   Thyroid disease Sister    Allergies Sister    CVA Neg Hx    Heart attack Neg Hx    High Cholesterol Neg Hx    Colon cancer Neg Hx     Past Surgical History:  Procedure Laterality Date   APPENDECTOMY     PULMONARY THROMBECTOMY Bilateral 01/01/2022   Procedure: PULMONARY THROMBECTOMY;  Surgeon: Renford Dills, MD;  Location: [facility] INVASIVE CV LAB;  Service: Cardiovascular;  Laterality: Bilateral;   TONSILLECTOMY AND ADENOIDECTOMY     TOTAL KNEE ARTHROPLASTY Left 12/09/2021   Procedure: LEFT TOTAL KNEE ARTHROPLASTY;  Surgeon: Nadara Mustard, MD;  Location: [facility] OR;  Service: Orthopedics;  Laterality: Left;   Social History   Occupational History   Not on file  Tobacco Use   Smoking status: Never   Smokeless tobacco: Never  Substance and Sexual Activity   Alcohol use: Yes    Comment: Ocassional glass of wine   Drug use: No   Sexual activity: Not on file

## 2022-10-20 NOTE — Addendum Note (Signed)
Addended by: Aldean Baker on: 10/20/2022 05:03 PM   Modules accepted: Level of Service

## 2022-10-28 ENCOUNTER — Ambulatory Visit (INDEPENDENT_AMBULATORY_CARE_PROVIDER_SITE_OTHER): Payer: Self-pay | Admitting: Orthopedic Surgery

## 2022-10-28 ENCOUNTER — Other Ambulatory Visit (INDEPENDENT_AMBULATORY_CARE_PROVIDER_SITE_OTHER): Payer: Self-pay

## 2022-10-28 ENCOUNTER — Encounter: Payer: Self-pay | Admitting: Orthopedic Surgery

## 2022-10-28 ENCOUNTER — Other Ambulatory Visit: Payer: Self-pay

## 2022-10-28 DIAGNOSIS — Z96652 Presence of left artificial knee joint: Secondary | ICD-10-CM

## 2022-10-28 DIAGNOSIS — G8929 Other chronic pain: Secondary | ICD-10-CM

## 2022-10-28 DIAGNOSIS — M1711 Unilateral primary osteoarthritis, right knee: Secondary | ICD-10-CM

## 2022-10-28 DIAGNOSIS — M25561 Pain in right knee: Secondary | ICD-10-CM

## 2022-10-28 MED ORDER — PREDNISONE 10 MG PO TABS
10.0000 mg | ORAL_TABLET | Freq: Every day | ORAL | 0 refills | Status: DC
  Filled 2022-10-28 – 2022-10-30 (×2): qty 30, 30d supply, fill #0

## 2022-10-28 NOTE — Progress Notes (Signed)
Office Visit Note   Patient: Leah Roberts           Date of Birth: [birth year]           MRN: [id number] Visit Date: 10/28/2022              Requested by: Allegra Grana, FNP [address] [place],  [place] [address] PCP: Allegra Grana, FNP  Chief Complaint  Patient presents with   Left Knee - Follow-up    Hx TKR    Right Knee - Follow-up      HPI: Patient is a 72 year old woman with osteoarthritis of her right knee.  She is status post left total knee arthroplasty.  Patient has been having some left-sided radicular pain.  Patient has had temporary relief with oral prednisone for the radicular symptoms.  Patient will be in [place] through August.  Assessment & Plan: Visit Diagnoses:  1. Chronic pain of right knee   2. Status post total left knee replacement   3. Unilateral primary osteoarthritis, right knee     Plan: Patient would like to proceed with total knee arthroplasty on the right when she returns to [place].  Anticipate surgery in September.  Risks and benefits were discussed patient states she understands wishes to proceed at this time.  Prescription sent in for prednisone for her radicular symptoms.  Follow-Up Instructions: Return if symptoms worsen or fail to improve.   Ortho Exam  Patient is alert, oriented, no adenopathy, well-dressed, normal affect, normal respiratory effort. Examination patient has varus alignment of the right knee with standing there is crepitation with range of motion collaterals and cruciates are stable.  Radiographs show tricompartmental arthritis of the right knee with 5 degrees of varus.  Imaging: No results found. No images are attached to the encounter.  Labs: Lab Results  Component Value Date   HGBA1C 6.7 (H) 05/19/2022   HGBA1C 6.2 02/15/2018   HGBA1C 6.2 10/05/2017   ESRSEDRATE 10 05/29/2020   CRP <1.0 05/29/2020     Lab Results  Component Value Date   ALBUMIN 4.0 03/08/2022   ALBUMIN  3.7 01/13/2022   ALBUMIN 3.7 12/30/2021    Lab Results  Component Value Date   MG 2.0 01/04/2022   MG 2.0 01/03/2022   MG 1.8 01/02/2022   Lab Results  Component Value Date   VD25OH 21.43 (L) 10/05/2017    No results found for: "PREALBUMIN"    Latest Ref Rng & Units 09/22/2022    2:52 PM 08/23/2022   10:19 AM 05/19/2022    2:33 PM  CBC EXTENDED  WBC 4.0 - 10.5 K/uL 6.3  10.2  7.0   RBC 3.87 - 5.11 MIL/uL 4.52  4.63  4.41   Hemoglobin 12.0 - 15.0 g/dL 54.2  70.6  23.7   HCT 36.0 - 46.0 % 38.3  39.5  36.7   Platelets 150 - 400 K/uL 243  304.0  244.0   NEUT# 1.4 - 7.7 K/uL  4.4  3.8   Lymph# 0.7 - 4.0 K/uL  4.7  2.6      There is no height or weight on file to calculate BMI.  Orders:  Orders Placed This Encounter  Procedures   XR Knee 1-2 Views Right   No orders of the defined types were placed in this encounter.    Procedures: No procedures performed  Clinical Data: No additional findings.  ROS:  All other systems negative, except as noted in the HPI. Review of  Systems  Objective: Vital Signs: There were no vitals taken for this visit.  Specialty Comments:  No specialty comments available.  PMFS History: Patient Active Problem List   Diagnosis Date Noted   Shortness of breath 08/29/2022   Encounter for screening mammogram for malignant neoplasm of breast 08/29/2022   Right groin pain 01/13/2022   Bilateral pulmonary embolism with right heart strain RV/LV ratio 1:5 (HCC) 12/31/2021   Acute respiratory failure with hypoxia (HCC) 12/31/2021   Total knee replacement status, left 12/09/21 12/09/2021   Unilateral primary osteoarthritis, left knee    Shoulder disorder 09/03/2021   Arthritis of knee 09/03/2021   Lumbar radiculopathy 09/03/2021   Abnormal MRI, knee 09/03/2021   Chronic pain of right knee 09/03/2021   Bronchitis 03/25/2021   Anemia 01/09/2020   HTN (hypertension) 12/31/2019   Chest pain 12/31/2019   DOE (dyspnea on exertion) 12/31/2019    Acute coronary syndrome (HCC) 12/31/2019   Unstable angina (HCC) 12/31/2019   Atherosclerosis 08/23/2018   Screening for tuberculosis 07/12/2018   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   Arthralgia 07/12/2018   Solitary pulmonary nodule 07/12/2018   Neck pain 02/15/2018   Dizziness 02/15/2018   Elevated TSH 02/15/2018   Screening for HIV (human immunodeficiency virus) 10/05/2017   Neuropathy 10/05/2017   Screening for colon cancer 10/05/2017   Hyperlipidemia 08/04/2016   Prediabetes 08/04/2016   Back pain 08/04/2016   Bilateral knee pain 06/18/2016   History of DVT of lower extremity 04/01/2014   History of DVT and PE (deep vein thrombosis) 05/04/2011   Past Medical History:  Diagnosis Date   Arthritis    DVT (deep venous thrombosis) (HCC) 2013   Dyspnea    due to knee pain   Family history of adverse reaction to anesthesia    Brother and sister hard to wake up.   GERD (gastroesophageal reflux disease)    History of blood transfusion    History of hypertension    Hypertension    Peripheral vascular disease (HCC)    DVT   Pre-diabetes    Pulmonary embolism (HCC) 2013    Family History  Problem Relation Age of Onset   Hypertension Father    Heart disease Father 24   Thyroid disease Sister    Allergies Sister    CVA Neg Hx    Heart attack Neg Hx    High Cholesterol Neg Hx    Colon cancer Neg Hx     Past Surgical History:  Procedure Laterality Date   APPENDECTOMY     PULMONARY THROMBECTOMY Bilateral 01/01/2022   Procedure: PULMONARY THROMBECTOMY;  Surgeon: Renford Dills, MD;  Location: [facility] INVASIVE CV LAB;  Service: Cardiovascular;  Laterality: Bilateral;   TONSILLECTOMY AND ADENOIDECTOMY     TOTAL KNEE ARTHROPLASTY Left 12/09/2021   Procedure: LEFT TOTAL KNEE ARTHROPLASTY;  Surgeon: Nadara Mustard, MD;  Location: [facility] OR;  Service: Orthopedics;  Laterality: Left;   Social History   Occupational History   Not on file  Tobacco Use   Smoking status: Never   Smokeless  tobacco: Never  Substance and Sexual Activity   Alcohol use: Yes    Comment: Ocassional glass of wine   Drug use: No   Sexual activity: Not on file

## 2022-10-29 ENCOUNTER — Other Ambulatory Visit: Payer: Self-pay

## 2022-10-31 ENCOUNTER — Other Ambulatory Visit: Payer: Self-pay

## 2022-11-03 ENCOUNTER — Other Ambulatory Visit: Payer: Self-pay

## 2022-11-05 ENCOUNTER — Other Ambulatory Visit: Payer: Self-pay

## 2022-11-24 ENCOUNTER — Other Ambulatory Visit: Payer: Self-pay

## 2022-11-24 ENCOUNTER — Ambulatory Visit: Payer: No Typology Code available for payment source | Admitting: Family

## 2022-12-02 ENCOUNTER — Other Ambulatory Visit: Payer: Self-pay

## 2022-12-03 ENCOUNTER — Other Ambulatory Visit: Payer: Self-pay

## 2022-12-06 ENCOUNTER — Other Ambulatory Visit: Payer: Self-pay

## 2022-12-07 ENCOUNTER — Ambulatory Visit
Payer: No Typology Code available for payment source | Admitting: Student in an Organized Health Care Education/Training Program

## 2022-12-09 ENCOUNTER — Ambulatory Visit
Payer: No Typology Code available for payment source | Admitting: Student in an Organized Health Care Education/Training Program

## 2022-12-21 ENCOUNTER — Other Ambulatory Visit: Payer: Self-pay

## 2023-01-12 ENCOUNTER — Other Ambulatory Visit: Payer: Self-pay

## 2023-01-12 ENCOUNTER — Encounter: Payer: Self-pay | Admitting: Family

## 2023-01-12 ENCOUNTER — Ambulatory Visit: Payer: No Typology Code available for payment source | Admitting: Family

## 2023-01-12 VITALS — BP 130/70 | HR 76 | Temp 98.3°F | Ht 66.0 in | Wt 237.8 lb

## 2023-01-12 DIAGNOSIS — E119 Type 2 diabetes mellitus without complications: Secondary | ICD-10-CM

## 2023-01-12 DIAGNOSIS — R7303 Prediabetes: Secondary | ICD-10-CM

## 2023-01-12 DIAGNOSIS — I50810 Right heart failure, unspecified: Secondary | ICD-10-CM

## 2023-01-12 DIAGNOSIS — L209 Atopic dermatitis, unspecified: Secondary | ICD-10-CM

## 2023-01-12 DIAGNOSIS — R7309 Other abnormal glucose: Secondary | ICD-10-CM

## 2023-01-12 DIAGNOSIS — G629 Polyneuropathy, unspecified: Secondary | ICD-10-CM

## 2023-01-12 LAB — MICROALBUMIN / CREATININE URINE RATIO
Creatinine,U: 94.7 mg/dL
Microalb Creat Ratio: 0.7 mg/g (ref 0.0–30.0)
Microalb, Ur: <0.7 mg/dL (ref 0.0–1.9)

## 2023-01-12 LAB — POCT GLYCOSYLATED HEMOGLOBIN (HGB A1C): Hemoglobin A1C: 6.1 % — AB (ref 4.0–5.6)

## 2023-01-12 LAB — B12 AND FOLATE PANEL
Folate: 21 ng/mL (ref >5.9)
Vitamin B-12: 448 pg/mL (ref 211–911)

## 2023-01-12 LAB — TSH: TSH: 5.16 u[IU]/mL (ref 0.35–5.50)

## 2023-01-12 MED ORDER — TRIAMCINOLONE ACETONIDE 0.5 % EX OINT
1.0000 | TOPICAL_OINTMENT | Freq: Two times a day (BID) | CUTANEOUS | 2 refills | Status: DC
  Filled 2023-01-12: qty 15, 30d supply, fill #0
  Filled 2023-03-10: qty 15, 30d supply, fill #1
  Filled 2023-04-06: qty 15, 30d supply, fill #2

## 2023-01-12 MED ORDER — GABAPENTIN 100 MG PO CAPS
100.0000 mg | ORAL_CAPSULE | Freq: Every day | ORAL | 3 refills | Status: DC
Start: 2023-01-12 — End: 2023-02-16
  Filled 2023-01-12: qty 90, 90d supply, fill #0

## 2023-01-12 NOTE — Assessment & Plan Note (Signed)
Lost to follow-up.  She did not have cardiac cath done.  Reiterated the importance of scheduling a follow-up and provided contact information.

## 2023-01-12 NOTE — Assessment & Plan Note (Addendum)
Etiology unclear. Patient has h/o DM; more recently she has been prediabetic.   Start gabapentin 100 mg nightly and titrate.  May also be aggravated by plantar fasciitis.  Encouraged more supportive shoes.Pending ABIs.

## 2023-01-12 NOTE — Assessment & Plan Note (Signed)
Presentation consistent with atopic dermatitis.  Consider tinea.  Trial of triamcinolone < 7 days.

## 2023-01-12 NOTE — Progress Notes (Signed)
Assessment & Plan:  Neuropathy Assessment & Plan: Etiology unclear. Patient has h/o DM; more recently she has been prediabetic.   Start gabapentin 100 mg nightly and titrate.  May also be aggravated by plantar fasciitis.  Encouraged more supportive shoes.Pending ABIs.   Orders: -     Gabapentin; Take 1 capsule (100 mg total) by mouth at bedtime.  Dispense: 90 capsule; Refill: 3 -     B12 and Folate Panel -     TSH -     US ARTERIAL ABI (SCREENING LOWER EXTREMITY); Future  RVF (right ventricular failure) [facility]) Assessment & Plan: Lost to follow-up.  She did not have cardiac cath done.  Reiterated the importance of scheduling a follow-up and provided contact information.    Diabetes mellitus without complication (HCC) -     Microalbumin / creatinine urine ratio  Atopic dermatitis, unspecified type Assessment & Plan: Presentation consistent with atopic dermatitis.  Consider tinea.  Trial of triamcinolone < 7 days.   Orders: -     Triamcinolone Acetonide; Apply 1 Application topically 2 (two) times daily. Use sparingly for < 1 week, left ankle  Dispense: 15 g; Refill: 2  Prediabetes Assessment & Plan: A1c 6.1  Well-controlled, stable.  Will monitor.    Elevated glucose -     POCT glycosylated hemoglobin (Hb A1C)     Return precautions given.   Risks, benefits, and alternatives of the medications and treatment plan prescribed today were discussed, and patient expressed understanding.   Education regarding symptom management and diagnosis given to patient on AVS either electronically or printed.  Return in about 6 weeks (around 02/23/2023).  Rennie Plowman, FNP  Subjective:    Patient ID: Leah Roberts, female    DOB: 06/14/1950, 72 y.o.   MRN: [id number]  CC: Leah Roberts is a 72 y.o. female who presents today for follow up.   HPI: Bilateral lateral toes numbness, worse when walking.  Ongoing for years.   Wears slides as shoes most of the time.    No swelling, sob.   Previous on gabapentin and not exactly sure why she stopped medication.   Compliant with eliquis. Avoids NSAIDs.   She plans to have right knee replaced in the future with Dr Lajoyce Corners.  Complains of itchy rash left lateral ankle.  She has not tried any medication for this.      S/p Left TKR  Consult cardiology 09/22/2018 for right ventricular failure, Dr  Gasper Lloyd She canceled right heart cath 10/01/22 due to absence of CP, SOB per patient.   No follow-up scheduled with cardiology   Allergies: Sulfa antibiotics Current Outpatient Medications on File Prior to Visit  Medication Sig Dispense Refill   albuterol (VENTOLIN HFA) 108 (90 Base) MCG/ACT inhaler Inhale 2 puffs into the lungs every 6 (six) hours as needed for wheezing or shortness of breath. 6.7 g 0   apixaban (ELIQUIS) 5 MG TABS tablet Take 1 tablet (5 mg total) by mouth 2 (two) times daily. 180 tablet 3   calcium carbonate (TUMS - DOSED IN MG ELEMENTAL CALCIUM) 500 MG chewable tablet Chew 1 tablet by mouth daily as needed for indigestion or heartburn.     ferrous sulfate (FEROSUL) 325 (65 FE) MG tablet Take 1 tablet (325 mg total) by mouth 2 (two) times daily with a meal. 60 tablet 1   Fluticasone-Umeclidin-Vilant (TRELEGY ELLIPTA) 200-62.5-25 MCG/ACT AEPB Inhale 1 puff into the lungs daily. 60 each 0   loratadine (CLARITIN) 10 MG tablet Take 10 mg  by mouth daily.     Menthol, Topical Analgesic, (BIOFREEZE EX) Apply 1 Application topically daily as needed (leg pain).     Multiple Vitamin (MULTIVITAMIN WITH MINERALS) TABS tablet Take 1 tablet by mouth 2 (two) times a week.     rosuvastatin (CRESTOR) 5 MG tablet Take 1 tablet (5 mg total) by mouth at bedtime. 90 tablet 3   No current facility-administered medications on file prior to visit.    Review of Systems  Constitutional:  Negative for chills and fever.  Respiratory:  Negative for cough and shortness of breath.   Cardiovascular:  Negative for  chest pain, palpitations and leg swelling.  Gastrointestinal:  Negative for nausea and vomiting.  Musculoskeletal:  Positive for arthralgias (right knee pain).  Skin:  Positive for rash.  Neurological:  Positive for numbness.      Objective:    BP 130/70   Pulse 76   Temp 98.3 F (36.8 C) (Oral)   Ht 5\' 6"  (1.676 m)   Wt 237 lb 12.8 oz (107.9 kg)   SpO2 98%   BMI 38.38 kg/m  BP Readings from Last 3 Encounters:  01/12/23 130/70  09/22/22 (!) 128/96  08/30/22 110/80   Wt Readings from Last 3 Encounters:  01/12/23 237 lb 12.8 oz (107.9 kg)  09/22/22 235 lb 9.6 oz (106.9 kg)  08/30/22 234 lb 9.6 oz (106.4 kg)    Physical Exam Vitals reviewed.  Constitutional:      Appearance: She is well-developed.  Eyes:     Conjunctiva/sclera: Conjunctivae normal.  Cardiovascular:     Rate and Rhythm: Normal rate and regular rhythm.     Pulses: Normal pulses.     Heart sounds: Normal heart sounds.     Comments: No LE edema, palpable cords or masses. No erythema or increased warmth. No asymmetry in calf size when compared bilaterally LE hair growth symmetric and present. LE warm . Diminished pedal pulses bilaterally  Pulmonary:     Effort: Pulmonary effort is normal.     Breath sounds: Normal breath sounds. No wheezing, rhonchi or rales.  Skin:    General: Skin is warm and dry.  Neurological:     Mental Status: She is alert.  Psychiatric:        Speech: Speech normal.        Behavior: Behavior normal.        Thought Content: Thought content normal.     Left lateral ankle patch ,scaly approx 2cm.  Area is not boggy.  No purulent discharge, erythema or increased warmth

## 2023-01-12 NOTE — Assessment & Plan Note (Signed)
A1c 6.1  Well-controlled, stable.  Will monitor.

## 2023-01-12 NOTE — Patient Instructions (Addendum)
Start tylenol arthritis 650mg  , one tablet in the morning and one tablet in evening for knee pain.   Icing 20 minutes 3 times per day  I would not wear your slides as I think this is aggravating the numbness in your feet and pain in your feet.  I would recommend very supportive tennis shoes every day Start gabapentin 100 mg at bedtime for neuropathy   Please ensure to call cardiology to schedule a follow-up , contact below.    [facility] at [facility] [address] [place],  [place]  [address]  Main: [phone]

## 2023-01-19 ENCOUNTER — Ambulatory Visit
Admission: RE | Admit: 2023-01-19 | Discharge: 2023-01-19 | Disposition: A | Discharge status: Home / Self Care | Payer: No Typology Code available for payment source | Source: Ambulatory Visit | Attending: Family | Admitting: Family

## 2023-01-19 ENCOUNTER — Other Ambulatory Visit: Payer: Self-pay

## 2023-01-19 DIAGNOSIS — G629 Polyneuropathy, unspecified: Secondary | ICD-10-CM | POA: Insufficient documentation

## 2023-01-27 ENCOUNTER — Other Ambulatory Visit: Payer: Self-pay

## 2023-01-27 ENCOUNTER — Encounter: Payer: Self-pay | Admitting: Orthopedic Surgery

## 2023-01-27 ENCOUNTER — Ambulatory Visit: Payer: No Typology Code available for payment source | Admitting: Orthopedic Surgery

## 2023-01-27 ENCOUNTER — Other Ambulatory Visit (INDEPENDENT_AMBULATORY_CARE_PROVIDER_SITE_OTHER): Payer: No Typology Code available for payment source

## 2023-01-27 DIAGNOSIS — M25562 Pain in left knee: Secondary | ICD-10-CM

## 2023-01-27 DIAGNOSIS — M79605 Pain in left leg: Secondary | ICD-10-CM

## 2023-01-27 DIAGNOSIS — Z96652 Presence of left artificial knee joint: Secondary | ICD-10-CM

## 2023-01-27 DIAGNOSIS — G8929 Other chronic pain: Secondary | ICD-10-CM

## 2023-01-27 DIAGNOSIS — M545 Low back pain, unspecified: Secondary | ICD-10-CM

## 2023-01-27 MED ORDER — PREDNISONE 10 MG PO TABS
10.0000 mg | ORAL_TABLET | Freq: Every day | ORAL | 0 refills | Status: DC
  Filled 2023-01-27: qty 30, 30d supply, fill #0

## 2023-01-27 NOTE — Progress Notes (Signed)
Office Visit Note   Patient: Leah Roberts           Date of Birth: 04/01/1951           MRN: [id number] Visit Date: 01/27/2023              Requested by: Allegra Grana, FNP [address] [place],  [place] [address] PCP: Allegra Grana, FNP  Chief Complaint  Patient presents with   Left Leg - Pain      HPI: Patient is a 72-year-patient also has lateral left knee pain and is concerned that this may be associated with her total knee replacement.  Old woman who presents with over 33-month history of left-sided radicular pain.  Patient states she has pain that radiates from the buttock to the lateral left calf and has numbness over the fourth and fifth toes and weakness with extension of the great toe.  Assessment & Plan: Visit Diagnoses:  1. Status post total left knee replacement   2. Pain in left leg   3. Low back pain radiating to left leg     Plan: Patient has been symptomatic for over 3 months with a radicular symptoms with temporary relief with prednisone.  Will obtain an MRI scan and anticipate possible epidural steroid injection for her radicular symptoms.  Prescription provided for prednisone.  Follow-Up Instructions: No follow-ups on file.   Ortho Exam  Patient is alert, oriented, no adenopathy, well-dressed, normal affect, normal respiratory effort. Examination patient has pain with straight leg raise on the left.  She has weakness with dorsiflexion of the left great toe.  Numbness in the fourth and fifth toes.  The left knee is stable with range of motion.  No pain with sitting.  Patient states the pain is burning.  Patient has had symptoms over 3 months with temporary relief with prednisone.  She is currently on Eliquis and cannot take nonsteroidals.  Imaging: No results found. No images are attached to the encounter.  Labs: Lab Results  Component Value Date   HGBA1C 6.1 (A) 01/12/2023   HGBA1C 6.7 (H) 05/19/2022   HGBA1C 6.2 02/15/2018    ESRSEDRATE 10 05/29/2020   CRP <1.0 05/29/2020     Lab Results  Component Value Date   ALBUMIN 4.0 03/08/2022   ALBUMIN 3.7 01/13/2022   ALBUMIN 3.7 12/30/2021    Lab Results  Component Value Date   MG 2.0 01/04/2022   MG 2.0 01/03/2022   MG 1.8 01/02/2022   Lab Results  Component Value Date   VD25OH 21.43 (L) 10/05/2017    No results found for: "PREALBUMIN"    Latest Ref Rng & Units 09/22/2022    2:52 PM 08/23/2022   10:19 AM 05/19/2022    2:33 PM  CBC EXTENDED  WBC 4.0 - 10.5 K/uL 6.3  10.2  7.0   RBC 3.87 - 5.11 MIL/uL 4.52  4.63  4.41   Hemoglobin 12.0 - 15.0 g/dL 66.4  40.3  47.4   HCT 36.0 - 46.0 % 38.3  39.5  36.7   Platelets 150 - 400 K/uL 243  304.0  244.0   NEUT# 1.4 - 7.7 K/uL  4.4  3.8   Lymph# 0.7 - 4.0 K/uL  4.7  2.6      There is no height or weight on file to calculate BMI.  Orders:  Orders Placed This Encounter  Procedures   XR Knee 1-2 Views Left   XR Lumbar Spine 2-3 Views  MR Lumbar Spine w/o contrast   Meds ordered this encounter  Medications   predniSONE (DELTASONE) 10 MG tablet    Sig: Take 1 tablet (10 mg total) by mouth daily with breakfast.    Dispense:  30 tablet    Refill:  0     Procedures: No procedures performed  Clinical Data: No additional findings.  ROS:  All other systems negative, except as noted in the HPI. Review of Systems  Objective: Vital Signs: There were no vitals taken for this visit.  Specialty Comments:  No specialty comments available.  PMFS History: Patient Active Problem List   Diagnosis Date Noted   RVF (right ventricular failure) (HCC) 01/12/2023   Atopic dermatitis 01/12/2023   Shortness of breath 08/29/2022   Encounter for screening mammogram for malignant neoplasm of breast 08/29/2022   Right groin pain 01/13/2022   Bilateral pulmonary embolism with right heart strain RV/LV ratio 1:5 (HCC) 12/31/2021   Acute respiratory failure with hypoxia (HCC) 12/31/2021   Total knee  replacement status, left 12/09/21 12/09/2021   Unilateral primary osteoarthritis, left knee    Shoulder disorder 09/03/2021   Arthritis of knee 09/03/2021   Lumbar radiculopathy 09/03/2021   Abnormal MRI, knee 09/03/2021   Chronic pain of right knee 09/03/2021   Bronchitis 03/25/2021   Anemia 01/09/2020   HTN (hypertension) 12/31/2019   Chest pain 12/31/2019   DOE (dyspnea on exertion) 12/31/2019   Acute coronary syndrome (HCC) 12/31/2019   Unstable angina (HCC) 12/31/2019   Atherosclerosis 08/23/2018   Screening for tuberculosis 07/12/2018   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   Arthralgia 07/12/2018   Solitary pulmonary nodule 07/12/2018   Neck pain 02/15/2018   Dizziness 02/15/2018   Elevated TSH 02/15/2018   Screening for HIV (human immunodeficiency virus) 10/05/2017   Neuropathy 10/05/2017   Screening for colon cancer 10/05/2017   Hyperlipidemia 08/04/2016   Prediabetes 08/04/2016   Back pain 08/04/2016   Bilateral knee pain 06/18/2016   History of DVT of lower extremity 04/01/2014   History of DVT and PE (deep vein thrombosis) 05/04/2011   Past Medical History:  Diagnosis Date   Arthritis    DVT (deep venous thrombosis) (HCC) 2013   Dyspnea    due to knee pain   Family history of adverse reaction to anesthesia    Brother and sister hard to wake up.   GERD (gastroesophageal reflux disease)    History of blood transfusion    History of hypertension    Hypertension    Peripheral vascular disease (HCC)    DVT   Pre-diabetes    Pulmonary embolism (HCC) 2013    Family History  Problem Relation Age of Onset   Hypertension Father    Heart disease Father 107   Thyroid disease Sister    Allergies Sister    CVA Neg Hx    Heart attack Neg Hx    High Cholesterol Neg Hx    Colon cancer Neg Hx     Past Surgical History:  Procedure Laterality Date   APPENDECTOMY     PULMONARY THROMBECTOMY Bilateral 01/01/2022   Procedure: PULMONARY THROMBECTOMY;  Surgeon: Renford Dills, MD;   Location: [facility] INVASIVE CV LAB;  Service: Cardiovascular;  Laterality: Bilateral;   TONSILLECTOMY AND ADENOIDECTOMY     TOTAL KNEE ARTHROPLASTY Left 12/09/2021   Procedure: LEFT TOTAL KNEE ARTHROPLASTY;  Surgeon: Nadara Mustard, MD;  Location: [facility] OR;  Service: Orthopedics;  Laterality: Left;   Social History   Occupational History   Not  on file  Tobacco Use   Smoking status: Never   Smokeless tobacco: Never  Substance and Sexual Activity   Alcohol use: Yes    Comment: Ocassional glass of wine   Drug use: No   Sexual activity: Not on file

## 2023-02-01 ENCOUNTER — Other Ambulatory Visit: Payer: Self-pay

## 2023-02-15 ENCOUNTER — Telehealth: Payer: Self-pay | Admitting: Family

## 2023-02-15 DIAGNOSIS — G629 Polyneuropathy, unspecified: Secondary | ICD-10-CM

## 2023-02-15 NOTE — Telephone Encounter (Signed)
Patient just called and said can she get some medication for muscle pain. She said her back is aching. The pharmacy she use is [facility] [address], [place] [address] Phone: [phone]  Fax: [phone]  Her number is [phone].

## 2023-02-16 ENCOUNTER — Other Ambulatory Visit: Payer: Self-pay

## 2023-02-16 MED ORDER — GABAPENTIN 100 MG PO CAPS
200.0000 mg | ORAL_CAPSULE | Freq: Every day | ORAL | 3 refills | Status: DC
Start: 2023-02-16 — End: 2023-08-24
  Filled 2023-02-16 – 2023-03-10 (×3): qty 90, 45d supply, fill #0
  Filled 2023-04-06: qty 90, 45d supply, fill #1
  Filled 2023-07-13: qty 90, 45d supply, fill #2

## 2023-02-16 NOTE — Telephone Encounter (Signed)
Spoke to pt and discussed the note with her  per margaret Please advise she may increase gabapentin to 200mg  at bedtime.please change on medication list   We discussed  titration of this medication at last visit  Continue tylenol arthritis 650mg  once daily, she may increase to 1 tablet in the morning and 1 tablet in the afternoon  Pt stated that she will schedule visit to discuss further pain medication options if that does not work. Gabapentin has been changed in  pt chart

## 2023-02-21 ENCOUNTER — Other Ambulatory Visit: Payer: Self-pay

## 2023-02-21 ENCOUNTER — Ambulatory Visit
Admission: RE | Admit: 2023-02-21 | Discharge: 2023-02-21 | Disposition: A | Discharge status: Home / Self Care | Payer: No Typology Code available for payment source | Source: Ambulatory Visit | Attending: Orthopedic Surgery | Admitting: Orthopedic Surgery

## 2023-02-21 DIAGNOSIS — M545 Low back pain, unspecified: Secondary | ICD-10-CM

## 2023-02-22 ENCOUNTER — Telehealth: Payer: Self-pay | Admitting: Family

## 2023-02-22 ENCOUNTER — Ambulatory Visit (INDEPENDENT_AMBULATORY_CARE_PROVIDER_SITE_OTHER): Payer: No Typology Code available for payment source

## 2023-02-22 DIAGNOSIS — Z23 Encounter for immunization: Secondary | ICD-10-CM

## 2023-02-22 NOTE — Progress Notes (Signed)
Patient presented for High Dose Flu vaccine to left deltoid, patient voiced no concerns nor showed any signs of distress during injection

## 2023-02-22 NOTE — Telephone Encounter (Signed)
Patient just walked in and said when she uses the bathroom. She is seeing blood in her urine. She would like for provider to call her and see what she can do about that. She will be out of town for the next two weeks. Her number is [phone].

## 2023-02-23 NOTE — Telephone Encounter (Signed)
noted 

## 2023-02-23 NOTE — Telephone Encounter (Signed)
Spoke to pt and she does npt feel it is significant enough to go to UC but she stated that if it continues she will get checked out when she returns from her trip she will just keep appt with you on 03/16/23

## 2023-03-07 ENCOUNTER — Encounter: Payer: Self-pay | Admitting: Orthopedic Surgery

## 2023-03-07 ENCOUNTER — Ambulatory Visit (INDEPENDENT_AMBULATORY_CARE_PROVIDER_SITE_OTHER): Payer: Self-pay | Admitting: Orthopedic Surgery

## 2023-03-07 DIAGNOSIS — M5442 Lumbago with sciatica, left side: Secondary | ICD-10-CM

## 2023-03-07 NOTE — Progress Notes (Signed)
Office Visit Note   Patient: Leah Roberts           Date of Birth: [birth year]           MRN: [id number] Visit Date: 03/07/2023              Requested by: Allegra Grana, FNP [address] [place],  [place] [address] PCP: Allegra Grana, FNP  Chief Complaint  Patient presents with   Lower Back - Follow-up    MRI follow up.      HPI: Patient is a 72 year old woman who was seen in follow-up for 2 separate issues.  #1 osteoarthritis right knee.  #2 left-sided radicular pain.  Assessment & Plan: Visit Diagnoses:  1. Acute left-sided low back pain with left-sided sciatica     Plan: Patient is scheduled for referral with Dr. Alvester Morin for evaluation for epidural steroid injections for the left side radicular pain.  Will follow-up in the office in 4 weeks.  Patient wishes to proceed with a total knee arthroplasty on the right.  Follow-Up Instructions: Return in about 4 weeks (around 04/04/2023).   Ortho Exam  Patient is alert, oriented, no adenopathy, well-dressed, normal affect, normal respiratory effort. Examination patient has crepitation and pain with range of motion of the right knee.  Examination of the left lower extremity she has no focal motor weakness negative straight leg raise.  Patient has pain from the left buttocks that radiates to the lateral left thigh lateral left calf and radiates to the lateral aspect of the left foot.  Review of the MRI scan shows facet arthropathy with bilateral foraminal stenosis L5-S1.  She is symptomatic on the left side.  Imaging: No results found. No images are attached to the encounter.  Labs: Lab Results  Component Value Date   HGBA1C 6.1 (A) 01/12/2023   HGBA1C 6.7 (H) 05/19/2022   HGBA1C 6.2 02/15/2018   ESRSEDRATE 10 05/29/2020   CRP <1.0 05/29/2020     Lab Results  Component Value Date   ALBUMIN 4.0 03/08/2022   ALBUMIN 3.7 01/13/2022   ALBUMIN 3.7 12/30/2021    Lab Results  Component Value Date    MG 2.0 01/04/2022   MG 2.0 01/03/2022   MG 1.8 01/02/2022   Lab Results  Component Value Date   VD25OH 21.43 (L) 10/05/2017    No results found for: "PREALBUMIN"    Latest Ref Rng & Units 09/22/2022    2:52 PM 08/23/2022   10:19 AM 05/19/2022    2:33 PM  CBC EXTENDED  WBC 4.0 - 10.5 K/uL 6.3  10.2  7.0   RBC 3.87 - 5.11 MIL/uL 4.52  4.63  4.41   Hemoglobin 12.0 - 15.0 g/dL 32.9  51.8  84.1   HCT 36.0 - 46.0 % 38.3  39.5  36.7   Platelets 150 - 400 K/uL 243  304.0  244.0   NEUT# 1.4 - 7.7 K/uL  4.4  3.8   Lymph# 0.7 - 4.0 K/uL  4.7  2.6      There is no height or weight on file to calculate BMI.  Orders:  Orders Placed This Encounter  Procedures   Ambulatory referral to Physical Medicine Rehab   No orders of the defined types were placed in this encounter.    Procedures: No procedures performed  Clinical Data: No additional findings.  ROS:  All other systems negative, except as noted in the HPI. Review of Systems  Objective: Vital Signs: There were  no vitals taken for this visit.  Specialty Comments:  MRI LUMBAR SPINE WITHOUT CONTRAST   TECHNIQUE: Multiplanar, multisequence MR imaging of the lumbar spine was performed. No intravenous contrast was administered.   COMPARISON:  Lumbar spine radiographs 01/27/2023   FINDINGS: Segmentation:  Standard.   Alignment:  3 mm anterolisthesis of L4 on L5.   Vertebrae: No fracture or suspicious marrow lesion. Mild degenerative facet edema at L4-5.   Conus medullaris and cauda equina: Conus extends to the L1 level. Conus and cauda equina appear normal.   Paraspinal and other soft tissues: Unremarkable.   Disc levels:   T12-L1: Minimal disc bulging, small left central disc protrusion, and mild right and moderate left facet and ligamentum flavum hypertrophy without stenosis.   L1-2: Minimal disc bulging, a shallow left paracentral to left subarticular disc protrusion, and moderate facet and  ligamentum flavum hypertrophy without stenosis.   L2-3: Mild disc bulging and severe facet and ligamentum flavum hypertrophy without stenosis.   L3-4: Minimal disc bulging and severe facet and ligamentum flavum hypertrophy without stenosis.   L4-5: Disc desiccation. Anterolisthesis with mild disc uncovering and severe facet and ligamentum flavum hypertrophy without stenosis.   L5-S1: Disc desiccation and moderate disc space narrowing. Disc bulging, endplate spurring, and severe facet and ligamentum flavum hypertrophy result in mild bilateral lateral recess stenosis and moderate to severe bilateral neural foraminal stenosis without significant spinal stenosis.   IMPRESSION: 1. Severe diffuse lumbar facet arthrosis. 2. Moderate to severe bilateral neural foraminal stenosis at L5-S1. 3. No spinal stenosis.     Electronically Signed   By: Sebastian Ache M.D.   On: 03/07/2023 08:47  PMFS History: Patient Active Problem List   Diagnosis Date Noted   RVF (right ventricular failure) (HCC) 01/12/2023   Atopic dermatitis 01/12/2023   Shortness of breath 08/29/2022   Encounter for screening mammogram for malignant neoplasm of breast 08/29/2022   Right groin pain 01/13/2022   Bilateral pulmonary embolism with right heart strain RV/LV ratio 1:5 (HCC) 12/31/2021   Acute respiratory failure with hypoxia (HCC) 12/31/2021   Total knee replacement status, left 12/09/21 12/09/2021   Unilateral primary osteoarthritis, left knee    Shoulder disorder 09/03/2021   Arthritis of knee 09/03/2021   Lumbar radiculopathy 09/03/2021   Abnormal MRI, knee 09/03/2021   Chronic pain of right knee 09/03/2021   Bronchitis 03/25/2021   Anemia 01/09/2020   HTN (hypertension) 12/31/2019   Chest pain 12/31/2019   DOE (dyspnea on exertion) 12/31/2019   Acute coronary syndrome (HCC) 12/31/2019   Unstable angina (HCC) 12/31/2019   Atherosclerosis 08/23/2018   Screening for tuberculosis 07/12/2018   Positive  PPD 07/12/2018   Arthralgia 07/12/2018   Solitary pulmonary nodule 07/12/2018   Neck pain 02/15/2018   Dizziness 02/15/2018   Elevated TSH 02/15/2018   Screening for HIV (human immunodeficiency virus) 10/05/2017   Neuropathy 10/05/2017   Screening for colon cancer 10/05/2017   Hyperlipidemia 08/04/2016   Prediabetes 08/04/2016   Back pain 08/04/2016   Bilateral knee pain 06/18/2016   History of DVT of lower extremity 04/01/2014   History of DVT and PE (deep vein thrombosis) 05/04/2011   Past Medical History:  Diagnosis Date   Arthritis    DVT (deep venous thrombosis) (HCC) 2013   Dyspnea    due to knee pain   Family history of adverse reaction to anesthesia    Brother and sister hard to wake up.   GERD (gastroesophageal reflux disease)    History of blood  transfusion    History of hypertension    Hypertension    Peripheral vascular disease (HCC)    DVT   Pre-diabetes    Pulmonary embolism (HCC) 2013    Family History  Problem Relation Age of Onset   Hypertension Father    Heart disease Father 8   Thyroid disease Sister    Allergies Sister    CVA Neg Hx    Heart attack Neg Hx    High Cholesterol Neg Hx    Colon cancer Neg Hx     Past Surgical History:  Procedure Laterality Date   APPENDECTOMY     PULMONARY THROMBECTOMY Bilateral 01/01/2022   Procedure: PULMONARY THROMBECTOMY;  Surgeon: Renford Dills, MD;  Location: [facility] CV [facility];  Service: Cardiovascular;  Laterality: Bilateral;   TONSILLECTOMY AND ADENOIDECTOMY     TOTAL KNEE ARTHROPLASTY Left 12/09/2021   Procedure: LEFT TOTAL KNEE ARTHROPLASTY;  Surgeon: Nadara Mustard, MD;  Location: [facility] OR;  Service: Orthopedics;  Laterality: Left;   Social History   Occupational History   Not on file  Tobacco Use   Smoking status: Never   Smokeless tobacco: Never  Substance and Sexual Activity   Alcohol use: Yes    Comment: Ocassional glass of wine   Drug use: No   Sexual activity: Not on file

## 2023-03-10 ENCOUNTER — Other Ambulatory Visit: Payer: Self-pay | Admitting: Family Medicine

## 2023-03-10 ENCOUNTER — Other Ambulatory Visit: Payer: Self-pay

## 2023-03-10 ENCOUNTER — Other Ambulatory Visit: Payer: Self-pay | Admitting: Family

## 2023-03-10 DIAGNOSIS — D649 Anemia, unspecified: Secondary | ICD-10-CM

## 2023-03-10 DIAGNOSIS — J40 Bronchitis, not specified as acute or chronic: Secondary | ICD-10-CM

## 2023-03-11 ENCOUNTER — Other Ambulatory Visit: Payer: Self-pay | Admitting: Family

## 2023-03-11 ENCOUNTER — Other Ambulatory Visit: Payer: Self-pay

## 2023-03-11 DIAGNOSIS — D649 Anemia, unspecified: Secondary | ICD-10-CM

## 2023-03-11 MED ORDER — TRELEGY ELLIPTA 200-62.5-25 MCG/ACT IN AEPB
1.0000 | INHALATION_SPRAY | Freq: Every day | RESPIRATORY_TRACT | 0 refills | Status: DC
  Filled 2023-03-11: qty 60, 30d supply, fill #0

## 2023-03-12 ENCOUNTER — Other Ambulatory Visit: Payer: Self-pay

## 2023-03-14 ENCOUNTER — Other Ambulatory Visit: Payer: Self-pay

## 2023-03-15 ENCOUNTER — Other Ambulatory Visit: Payer: Self-pay

## 2023-03-15 ENCOUNTER — Ambulatory Visit (INDEPENDENT_AMBULATORY_CARE_PROVIDER_SITE_OTHER): Payer: Self-pay | Admitting: Physical Medicine and Rehabilitation

## 2023-03-15 DIAGNOSIS — M48061 Spinal stenosis, lumbar region without neurogenic claudication: Secondary | ICD-10-CM

## 2023-03-15 DIAGNOSIS — G8929 Other chronic pain: Secondary | ICD-10-CM

## 2023-03-15 DIAGNOSIS — M5442 Lumbago with sciatica, left side: Secondary | ICD-10-CM

## 2023-03-15 DIAGNOSIS — M5416 Radiculopathy, lumbar region: Secondary | ICD-10-CM

## 2023-03-15 DIAGNOSIS — M47816 Spondylosis without myelopathy or radiculopathy, lumbar region: Secondary | ICD-10-CM

## 2023-03-15 MED ORDER — METHYLPREDNISOLONE ACETATE 40 MG/ML IJ SUSP
40.0000 mg | Freq: Once | INTRAMUSCULAR | Status: AC
  Administered 2023-03-15: 40 mg

## 2023-03-15 MED FILL — Ferrous Sulfate Tab 325 MG (65 MG Elemental Fe): ORAL | 90 days supply | Qty: 90 | Fill #0 | Status: CN

## 2023-03-15 NOTE — Progress Notes (Deleted)
   Assessment & Plan:  There are no diagnoses linked to this encounter.   Return precautions given.   Risks, benefits, and alternatives of the medications and treatment plan prescribed today were discussed, and patient expressed understanding.   Education regarding symptom management and diagnosis given to patient on AVS either electronically or printed.  No follow-ups on file.  Rennie Plowman, FNP  Subjective:    Patient ID: Leah Roberts, female    DOB: October 22, 1950, 72 y.o.   MRN: [id number]  CC: Leah Roberts is a 72 y.o. female who presents today for an acute visit.    HPI: Reduce ferrous sulfate to 325mg  daily from BID      Allergies: Sulfa antibiotics Current Outpatient Medications on File Prior to Visit  Medication Sig Dispense Refill   albuterol (VENTOLIN HFA) 108 (90 Base) MCG/ACT inhaler Inhale 2 puffs into the lungs every 6 (six) hours as needed for wheezing or shortness of breath. 6.7 g 0   apixaban (ELIQUIS) 5 MG TABS tablet Take 1 tablet (5 mg total) by mouth 2 (two) times daily. 180 tablet 3   calcium carbonate (TUMS - DOSED IN MG ELEMENTAL CALCIUM) 500 MG chewable tablet Chew 1 tablet by mouth daily as needed for indigestion or heartburn.     ferrous sulfate (FEROSUL) 325 (65 FE) MG tablet Take 1 tablet (325 mg total) by mouth daily with breakfast. 90 tablet 0   Fluticasone-Umeclidin-Vilant (TRELEGY ELLIPTA) 200-62.5-25 MCG/ACT AEPB Inhale 1 puff into the lungs daily. 60 each 0   gabapentin (NEURONTIN) 100 MG capsule Take 2 capsules (200 mg total) by mouth at bedtime. 90 capsule 3   loratadine (CLARITIN) 10 MG tablet Take 10 mg by mouth daily.     Menthol, Topical Analgesic, (BIOFREEZE EX) Apply 1 Application topically daily as needed (leg pain).     Multiple Vitamin (MULTIVITAMIN WITH MINERALS) TABS tablet Take 1 tablet by mouth 2 (two) times a week.     predniSONE (DELTASONE) 10 MG tablet Take 1 tablet (10 mg total) by mouth daily with breakfast. 30  tablet 0   rosuvastatin (CRESTOR) 5 MG tablet Take 1 tablet (5 mg total) by mouth at bedtime. 90 tablet 3   triamcinolone ointment (KENALOG) 0.5 % Apply 1 Application topically 2 (two) times daily. Use sparingly for < 1 week, left ankle 15 g 2   No current facility-administered medications on file prior to visit.    Review of Systems    Objective:    There were no vitals taken for this visit.  BP Readings from Last 3 Encounters:  01/12/23 130/70  09/22/22 (!) 128/96  08/30/22 110/80   Wt Readings from Last 3 Encounters:  01/12/23 237 lb 12.8 oz (107.9 kg)  09/22/22 235 lb 9.6 oz (106.9 kg)  08/30/22 234 lb 9.6 oz (106.4 kg)    Physical Exam

## 2023-03-15 NOTE — Progress Notes (Signed)
Functional Pain Scale - descriptive words and definitions  Distracting (5)    Aware of pain/able to complete some ADL's but limited by pain/sleep is affected and active distractions are only slightly useful. Moderate range order  Average Pain 5 158/88  +Driver, -BT, -Dye Allergies.

## 2023-03-15 NOTE — Patient Instructions (Signed)

## 2023-03-16 ENCOUNTER — Ambulatory Visit: Payer: No Typology Code available for payment source | Admitting: Family

## 2023-03-23 ENCOUNTER — Ambulatory Visit: Payer: No Typology Code available for payment source | Admitting: Family

## 2023-03-23 ENCOUNTER — Other Ambulatory Visit: Payer: Self-pay | Admitting: Family

## 2023-03-23 ENCOUNTER — Encounter: Payer: Self-pay | Admitting: Family

## 2023-03-23 ENCOUNTER — Other Ambulatory Visit: Payer: Self-pay

## 2023-03-23 VITALS — BP 130/80 | HR 73 | Temp 97.9°F | Ht 66.0 in | Wt 242.2 lb

## 2023-03-23 DIAGNOSIS — I7090 Unspecified atherosclerosis: Secondary | ICD-10-CM

## 2023-03-23 DIAGNOSIS — R319 Hematuria, unspecified: Secondary | ICD-10-CM

## 2023-03-23 DIAGNOSIS — K649 Unspecified hemorrhoids: Secondary | ICD-10-CM | POA: Insufficient documentation

## 2023-03-23 LAB — URINALYSIS, ROUTINE W REFLEX MICROSCOPIC
Bilirubin Urine: NEGATIVE
Hgb urine dipstick: NEGATIVE
Ketones, ur: NEGATIVE
Leukocytes,Ua: NEGATIVE
Nitrite: NEGATIVE
RBC / HPF: NONE SEEN (ref 0–?)
Specific Gravity, Urine: 1.01 (ref 1.000–1.030)
Total Protein, Urine: NEGATIVE
Urine Glucose: NEGATIVE
Urobilinogen, UA: 0.2 (ref 0.0–1.0)
WBC, UA: NONE SEEN (ref 0–?)
pH: 6 (ref 5.0–8.0)

## 2023-03-23 MED ORDER — DOCUSATE SODIUM 100 MG PO CAPS
100.0000 mg | ORAL_CAPSULE | Freq: Two times a day (BID) | ORAL | 1 refills | Status: AC
  Filled 2023-03-23: qty 30, 15d supply, fill #0
  Filled 2023-04-06: qty 30, 15d supply, fill #1

## 2023-03-23 MED ORDER — HYDROCORTISONE (PERIANAL) 2.5 % EX CREA
1.0000 | TOPICAL_CREAM | Freq: Two times a day (BID) | CUTANEOUS | 0 refills | Status: DC
  Filled 2023-03-23: qty 30, 10d supply, fill #0

## 2023-03-23 MED FILL — Rosuvastatin Calcium Tab 5 MG: ORAL | 90 days supply | Qty: 90 | Fill #0 | Status: CN

## 2023-03-23 NOTE — Assessment & Plan Note (Signed)
Visible hemorrhoid on exam.  Positive Hemoccult.  Treat with Anusol, Colace.  If persists, recommend GI consult

## 2023-03-23 NOTE — Progress Notes (Unsigned)
Assessment & Plan:  There are no diagnoses linked to this encounter.   Return precautions given.   Risks, benefits, and alternatives of the medications and treatment plan prescribed today were discussed, and patient expressed understanding.   Education regarding symptom management and diagnosis given to patient on AVS either electronically or printed.  No follow-ups on file.  Leah Plowman, FNP  Subjective:    Patient ID: Leah Roberts, female    DOB: 12/13/1950, 72 y.o.   MRN: [id number]  CC: Leah Roberts is a 72 y.o. female who presents today for an acute visit.    HPI: HPI Complains of rectal bleeding, episodic x 1 month.  Occurs after BM.  She is not straining, constipation, fever, chills. melana.  She only sees it when she wipes her rectum.    No h/o colonoscopy.   She is compliant with eliquis 5mg  BID.        Allergies: Sulfa antibiotics Current Outpatient Medications on File Prior to Visit  Medication Sig Dispense Refill   albuterol (VENTOLIN HFA) 108 (90 Base) MCG/ACT inhaler Inhale 2 puffs into the lungs every 6 (six) hours as needed for wheezing or shortness of breath. 6.7 g 0   apixaban (ELIQUIS) 5 MG TABS tablet Take 1 tablet (5 mg total) by mouth 2 (two) times daily. 180 tablet 3   calcium carbonate (TUMS - DOSED IN MG ELEMENTAL CALCIUM) 500 MG chewable tablet Chew 1 tablet by mouth daily as needed for indigestion or heartburn.     ferrous sulfate (FEROSUL) 325 (65 FE) MG tablet Take 1 tablet (325 mg total) by mouth daily with breakfast. 90 tablet 0   Fluticasone-Umeclidin-Vilant (TRELEGY ELLIPTA) 200-62.5-25 MCG/ACT AEPB Inhale 1 puff into the lungs daily. 60 each 0   gabapentin (NEURONTIN) 100 MG capsule Take 2 capsules (200 mg total) by mouth at bedtime. 90 capsule 3   loratadine (CLARITIN) 10 MG tablet Take 10 mg by mouth daily.     Menthol, Topical Analgesic, (BIOFREEZE EX) Apply 1 Application topically daily as needed (leg pain).      Multiple Vitamin (MULTIVITAMIN WITH MINERALS) TABS tablet Take 1 tablet by mouth 2 (two) times a week.     predniSONE (DELTASONE) 10 MG tablet Take 1 tablet (10 mg total) by mouth daily with breakfast. 30 tablet 0   rosuvastatin (CRESTOR) 5 MG tablet Take 1 tablet (5 mg total) by mouth at bedtime. 90 tablet 3   triamcinolone ointment (KENALOG) 0.5 % Apply 1 Application topically 2 (two) times daily. Use sparingly for < 1 week, left ankle 15 g 2   No current facility-administered medications on file prior to visit.    Review of Systems    Objective:    BP 130/80   Pulse 73   Temp 97.9 F (36.6 C) (Oral)   Ht 5\' 6"  (1.676 m)   Wt 242 lb 3.2 oz (109.9 kg)   SpO2 96%   BMI 39.09 kg/m   BP Readings from Last 3 Encounters:  03/23/23 130/80  01/12/23 130/70  09/22/22 (!) 128/96   Wt Readings from Last 3 Encounters:  03/23/23 242 lb 3.2 oz (109.9 kg)  01/12/23 237 lb 12.8 oz (107.9 kg)  09/22/22 235 lb 9.6 oz (106.9 kg)    Physical Exam Vitals reviewed.  Constitutional:      Appearance: She is well-developed.  Eyes:     Conjunctiva/sclera: Conjunctivae normal.  Cardiovascular:     Rate and Rhythm: Normal rate and regular rhythm.  Pulses: Normal pulses.     Heart sounds: Normal heart sounds.  Pulmonary:     Effort: Pulmonary effort is normal.     Breath sounds: Normal breath sounds. No wheezing, rhonchi or rales.  Genitourinary:    Rectum: Guaiac result positive.     Comments: External hemorrhoid. Non thrombosed  Skin:    General: Skin is warm and dry.  Neurological:     Mental Status: She is alert.  Psychiatric:        Speech: Speech normal.        Behavior: Behavior normal.        Thought Content: Thought content normal.

## 2023-03-24 ENCOUNTER — Other Ambulatory Visit: Payer: Self-pay

## 2023-03-24 LAB — URINE CULTURE
MICRO NUMBER:: 15756994
Result:: NO GROWTH
SPECIMEN QUALITY:: ADEQUATE

## 2023-03-24 NOTE — Patient Instructions (Signed)
Start Colace and Anusol.  It is most important to avoid straining and to have loose, easy to pass bowel movements daily.  If blood does not completely resolve, please let me know as I would recommend gastroenterology consult.  Hemorrhoids Hemorrhoids are swollen veins in and around the rectum or the opening of the butt (anus). There are two types of hemorrhoids: Internal. These occur in the veins just inside the rectum. They may poke through to the outside and become irritated and painful. External. These occur in the veins outside the anus. They can be felt as a painful swelling or hard lump near the anus. Most hemorrhoids do not cause severe problems. Often, they can be treated at home with diet and lifestyle changes. If home treatments do not help, you may need a procedure to shrink or remove the hemorrhoids. What are the causes? Hemorrhoids are caused by pressure near the anus. This pressure may be caused by: Constipation or diarrhea. Straining to poop. Pregnancy. Obesity. Sitting or riding a bike for a long time. Heavy lifting or other things that cause you to strain. Anal sex. What are the signs or symptoms? Symptoms of this condition include: Pain. Anal itching or irritation. Bleeding from the rectum. Leakage of poop (stool). Swelling of the anus. One or more lumps around the anus. How is this diagnosed? Hemorrhoids can often be diagnosed through a visual exam. Other exams or tests may also be done, such as: A digital rectal exam. This is when your health care provider feels inside your rectum with a gloved finger. Anoscope. This is an exam of the anus using a small tube. A blood test, if you have lost a lot of blood. A sigmoidoscopy or colonoscopy. These are tests to look inside the colon using a tube with a camera on the end. How is this treated? In most cases, hemorrhoids can be treated at home with diet and lifestyle changes. If these changes do not help, you may need to  have a procedure done. These procedures can make the hemorrhoids smaller or fully remove them. Common procedures include: Rubber band ligation. Rubber bands are placed at the base of the hemorrhoids to cut off their blood supply. Sclerotherapy. Medicine is put into the hemorrhoids to shrink them. Infrared coagulation. A type of light energy is used to get rid of the hemorrhoids. Hemorrhoidectomy surgery. The hemorrhoids are removed during surgery. Then, the veins that supply them are tied off. Stapled hemorrhoidopexy surgery. The base of the hemorrhoid is stapled to the wall of the rectum. Follow these instructions at home: Medicines Take over-the-counter and prescription medicines only as told by your provider. Use medicated creams or medicines that are put in the rectum (suppositories) as told by your provider. Eating and drinking  Eat foods that are high in fiber, such as beans, whole grains, and fresh fruits and vegetables. Ask your provider about taking products that have fiber added to them (fiber supplements). Reduce the amount of fat in your diet. You can do this by eating low-fat dairy products, eating less red meat, and avoiding processed foods. Drink enough fluid to keep your pee (urine) pale yellow. Managing pain and swelling  Take warm sitz baths for 20 minutes, 3-4 times a day. This can help ease pain and discomfort. You may do this in a bathtub or you can use a portable sitz bath that fits over the toilet. If told, put ice on the affected area. It may help to use ice packs between sitz  baths. Put ice in a plastic bag. Place a towel between your skin and the bag. Leave the ice on for 20 minutes, 2-3 times a day. If your skin turns bright red, remove the ice right away to prevent skin damage. The risk of damage is higher if you cannot feel pain, heat, or cold. General instructions Exercise. Ask your provider how much and what kind of exercise is best for you. In general, you  should do moderate exercise for at least 30 minutes on most days of the week (150 minutes each week). You may want to try walking, biking, or yoga. Go to the bathroom when you have the urge to poop. Do not wait. Avoid straining to poop. Keep the anus dry and clean. Use wet toilet paper or moist towelettes after you poop. Do not sit on the toilet for a long time. This can increase blood pooling and pain. Where to find more information [employer] and Digestive [employer] Kidney Diseases: [employer] Contact a health care provider if: You have more pain and swelling that do not get better with treatment. You have trouble pooping or you are not able to poop. You have pain or inflammation outside the area of the hemorrhoids. Get help right away if: You are bleeding from your rectum and you cannot get it to stop. This information is not intended to replace advice given to you by your health care provider. Make sure you discuss any questions you have with your health care provider. Document Revised: 12/30/2021 Document Reviewed: 12/30/2021 Elsevier Patient Education  2024 [employer].

## 2023-03-28 NOTE — Procedures (Signed)
Lumbosacral Transforaminal Epidural Steroid Injection - Sub-Pedicular Approach with Fluoroscopic Guidance  Patient: Leah Roberts      Date of Birth: 1950/08/29 MRN: [id number] PCP: Allegra Grana, FNP      Visit Date: 03/15/2023   Universal Protocol:    Date/Time: 03/15/2023  Consent Given By: the patient  Position: PRONE  Additional Comments: Vital signs were monitored before and after the procedure. Patient was prepped and draped in the usual sterile fashion. The correct patient, procedure, and site was verified.   Injection Procedure Details:   Procedure diagnoses: Lumbar radiculopathy [M54.16]    Meds Administered:  Meds ordered this encounter  Medications   methylPREDNISolone acetate (DEPO-MEDROL) injection 40 mg    Laterality: Left  Location/Site: L5  Needle:5.0 in., 22 ga.  Short bevel or Quincke spinal needle  Needle Placement: Transforaminal  Findings:    -Comments: Excellent flow of contrast along the nerve, nerve root and into the epidural space.  Procedure Details: After squaring off the end-plates to get a true AP view, the C-arm was positioned so that an oblique view of the foramen as noted above was visualized. The target area is just inferior to the "nose of the scotty dog" or sub pedicular. The soft tissues overlying this structure were infiltrated with 2-3 ml. of 1% Lidocaine without Epinephrine.  The spinal needle was inserted toward the target using a "trajectory" view along the fluoroscope beam.  Under AP and lateral visualization, the needle was advanced so it did not puncture dura and was located close the 6 O'Clock position of the pedical in AP tracterory. Biplanar projections were used to confirm position. Aspiration was confirmed to be negative for CSF and/or blood. A 1-2 ml. volume of Isovue-250 was injected and flow of contrast was noted at each level. Radiographs were obtained for documentation purposes.   After attaining the  desired flow of contrast documented above, a 0.5 to 1.0 ml test dose of 0.25% Marcaine was injected into each respective transforaminal space.  The patient was observed for 90 seconds post injection.  After no sensory deficits were reported, and normal lower extremity motor function was noted,   the above injectate was administered so that equal amounts of the injectate were placed at each foramen (level) into the transforaminal epidural space.   Additional Comments:  The patient tolerated the procedure well Dressing: 2 x 2 sterile gauze and Band-Aid    Post-procedure details: Patient was observed during the procedure. Post-procedure instructions were reviewed.  Patient left the clinic in stable condition.

## 2023-03-28 NOTE — Progress Notes (Signed)
Leah Roberts - 72 y.o. female MRN [id number]  Date of birth: July 01, 1950  Office Visit Note: Visit Date: 03/15/2023 PCP: Allegra Grana, FNP Referred by: Allegra Grana, FNP  Subjective: Chief Complaint  Patient presents with   Lower Back - Pain   HPI:  Leah Roberts is a 72 y.o. female who comes in today at the request of Dr. Aldean Baker for planned Left L5-S1 Lumbar Transforaminal epidural steroid injection with fluoroscopic guidance.  The patient has failed conservative care including home exercise, medications, time and activity modification.  This injection will be diagnostic and hopefully therapeutic.  Please see requesting physician notes for further details and justification.   ROS Otherwise per HPI.  Assessment & Plan: Visit Diagnoses:    ICD-10-CM   1. Lumbar radiculopathy  M54.16 XR C-ARM NO REPORT    Epidural Steroid injection    methylPREDNISolone acetate (DEPO-MEDROL) injection 40 mg    2. Foraminal stenosis of lumbar region  M48.061     3. Spondylosis without myelopathy or radiculopathy, lumbar region  M47.816     4. Chronic bilateral low back pain with left-sided sciatica  M54.42    G89.29       Plan: No additional findings.   Meds & Orders:  Meds ordered this encounter  Medications   methylPREDNISolone acetate (DEPO-MEDROL) injection 40 mg    Orders Placed This Encounter  Procedures   XR C-ARM NO REPORT   Epidural Steroid injection    Follow-up: Return for visit to requesting provider as needed.   Procedures: No procedures performed  Lumbosacral Transforaminal Epidural Steroid Injection - Sub-Pedicular Approach with Fluoroscopic Guidance  Patient: Leah Roberts      Date of Birth: 1950-07-20 MRN: [id number] PCP: Allegra Grana, FNP      Visit Date: 03/15/2023   Universal Protocol:    Date/Time: 03/15/2023  Consent Given By: the patient  Position: PRONE  Additional Comments: Vital signs were monitored before  and after the procedure. Patient was prepped and draped in the usual sterile fashion. The correct patient, procedure, and site was verified.   Injection Procedure Details:   Procedure diagnoses: Lumbar radiculopathy [M54.16]    Meds Administered:  Meds ordered this encounter  Medications   methylPREDNISolone acetate (DEPO-MEDROL) injection 40 mg    Laterality: Left  Location/Site: L5  Needle:5.0 in., 22 ga.  Short bevel or Quincke spinal needle  Needle Placement: Transforaminal  Findings:    -Comments: Excellent flow of contrast along the nerve, nerve root and into the epidural space.  Procedure Details: After squaring off the end-plates to get a true AP view, the C-arm was positioned so that an oblique view of the foramen as noted above was visualized. The target area is just inferior to the "nose of the scotty dog" or sub pedicular. The soft tissues overlying this structure were infiltrated with 2-3 ml. of 1% Lidocaine without Epinephrine.  The spinal needle was inserted toward the target using a "trajectory" view along the fluoroscope beam.  Under AP and lateral visualization, the needle was advanced so it did not puncture dura and was located close the 6 O'Clock position of the pedical in AP tracterory. Biplanar projections were used to confirm position. Aspiration was confirmed to be negative for CSF and/or blood. A 1-2 ml. volume of Isovue-250 was injected and flow of contrast was noted at each level. Radiographs were obtained for documentation purposes.   After attaining the desired flow of contrast documented above, a 0.5 to 1.0 ml  test dose of 0.25% Marcaine was injected into each respective transforaminal space.  The patient was observed for 90 seconds post injection.  After no sensory deficits were reported, and normal lower extremity motor function was noted,   the above injectate was administered so that equal amounts of the injectate were placed at each foramen (level)  into the transforaminal epidural space.   Additional Comments:  The patient tolerated the procedure well Dressing: 2 x 2 sterile gauze and Band-Aid    Post-procedure details: Patient was observed during the procedure. Post-procedure instructions were reviewed.  Patient left the clinic in stable condition.    Clinical History: MRI LUMBAR SPINE WITHOUT CONTRAST   TECHNIQUE: Multiplanar, multisequence MR imaging of the lumbar spine was performed. No intravenous contrast was administered.   COMPARISON:  Lumbar spine radiographs 01/27/2023   FINDINGS: Segmentation:  Standard.   Alignment:  3 mm anterolisthesis of L4 on L5.   Vertebrae: No fracture or suspicious marrow lesion. Mild degenerative facet edema at L4-5.   Conus medullaris and cauda equina: Conus extends to the L1 level. Conus and cauda equina appear normal.   Paraspinal and other soft tissues: Unremarkable.   Disc levels:   T12-L1: Minimal disc bulging, small left central disc protrusion, and mild right and moderate left facet and ligamentum flavum hypertrophy without stenosis.   L1-2: Minimal disc bulging, a shallow left paracentral to left subarticular disc protrusion, and moderate facet and ligamentum flavum hypertrophy without stenosis.   L2-3: Mild disc bulging and severe facet and ligamentum flavum hypertrophy without stenosis.   L3-4: Minimal disc bulging and severe facet and ligamentum flavum hypertrophy without stenosis.   L4-5: Disc desiccation. Anterolisthesis with mild disc uncovering and severe facet and ligamentum flavum hypertrophy without stenosis.   L5-S1: Disc desiccation and moderate disc space narrowing. Disc bulging, endplate spurring, and severe facet and ligamentum flavum hypertrophy result in mild bilateral lateral recess stenosis and moderate to severe bilateral neural foraminal stenosis without significant spinal stenosis.   IMPRESSION: 1. Severe diffuse lumbar facet  arthrosis. 2. Moderate to severe bilateral neural foraminal stenosis at L5-S1. 3. No spinal stenosis.     Electronically Signed   By: Sebastian Ache M.D.   On: 03/07/2023 08:47     Objective:  VS:  HT:    WT:   BMI:     BP:   HR: bpm  TEMP: ( )  RESP:  Physical Exam Vitals and nursing note reviewed.  Constitutional:      General: She is not in acute distress.    Appearance: Normal appearance. She is not ill-appearing.  HENT:     Head: Normocephalic and atraumatic.     Right Ear: External ear normal.     Left Ear: External ear normal.  Eyes:     Extraocular Movements: Extraocular movements intact.  Cardiovascular:     Rate and Rhythm: Normal rate.     Pulses: Normal pulses.  Pulmonary:     Effort: Pulmonary effort is normal. No respiratory distress.  Abdominal:     General: There is no distension.     Palpations: Abdomen is soft.  Musculoskeletal:        General: Tenderness present.     Cervical back: Neck supple.     Right lower leg: No edema.     Left lower leg: No edema.     Comments: Patient has good distal strength with no pain over the greater trochanters.  No clonus or focal weakness.  Skin:  Findings: No erythema, lesion or rash.  Neurological:     General: No focal deficit present.     Mental Status: She is alert and oriented to person, place, and time.     Sensory: No sensory deficit.     Motor: No weakness or abnormal muscle tone.     Coordination: Coordination normal.  Psychiatric:        Mood and Affect: Mood normal.        Behavior: Behavior normal.      Imaging: No results found.

## 2023-04-04 ENCOUNTER — Ambulatory Visit (INDEPENDENT_AMBULATORY_CARE_PROVIDER_SITE_OTHER): Payer: No Typology Code available for payment source | Admitting: Orthopedic Surgery

## 2023-04-04 DIAGNOSIS — M545 Low back pain, unspecified: Secondary | ICD-10-CM

## 2023-04-04 DIAGNOSIS — Z96652 Presence of left artificial knee joint: Secondary | ICD-10-CM

## 2023-04-04 DIAGNOSIS — M79605 Pain in left leg: Secondary | ICD-10-CM

## 2023-04-04 DIAGNOSIS — M1711 Unilateral primary osteoarthritis, right knee: Secondary | ICD-10-CM

## 2023-04-05 ENCOUNTER — Encounter: Payer: Self-pay | Admitting: Orthopedic Surgery

## 2023-04-05 NOTE — Progress Notes (Signed)
Office Visit Note   Patient: Leah Roberts           Date of Birth: [birth year]           MRN: [id number] Visit Date: 04/04/2023              Requested by: Allegra Grana, FNP [address] [place],  [place] [address] PCP: Allegra Grana, FNP  Chief Complaint  Patient presents with   Left Knee - Pain   Right Knee - Pain      HPI: Patient is a 72 year old woman who presents with chronic right knee pain, left knee pain status post total knee arthroplasty, lower back pain with left-sided radicular symptoms.  Patient has undergone steroid injections with Dr. Alvester Morin and she states she feels like this is much better.  Patient states the left knee pain is over the lateral aspect of the left leg with radicular symptoms.  Patient states the right knee has pain and swelling and cannot straighten her knee.  Patient states she is status [place] the airport[place]  She states she had an x-ray of her right knee and was told she had a torn meniscus.  Assessment & Plan: Visit Diagnoses:  1. Status post total left knee replacement   2. Unilateral primary osteoarthritis, right knee   3. Low back pain radiating to left leg     Plan: Patient states she would like to proceed with total knee arthroplasty on the right at this time.  She states she would like to proceed with surgery in the end of January.  Postoperative risks were discussed including treatment planning which would include discharge to home with home health physical therapy the day after surgery.  Patient states she understands wished to proceed at this time.  Follow-Up Instructions: No follow-ups on file.   Ortho Exam  Patient is alert, oriented, no adenopathy, well-dressed, normal affect, normal respiratory effort. Examination patient ambulates with a varus flexed knee gait on the right there is crepitation with range of motion of the right knee.  Radiograph shows tricompartmental arthritic changes.   Examination patient has a negative straight leg raise on the left but does have radicular pain in the lateral knee that radiates to the plantar aspect of the left foot.  The plantar fascia is not tender to palpation.  Patient's left total knee arthroplasty patella tracks midline varus and valgus stress is stable.  Imaging: No results found. No images are attached to the encounter.  Labs: Lab Results  Component Value Date   HGBA1C 6.1 (A) 01/12/2023   HGBA1C 6.7 (H) 05/19/2022   HGBA1C 6.2 02/15/2018   ESRSEDRATE 10 05/29/2020   CRP <1.0 05/29/2020     Lab Results  Component Value Date   ALBUMIN 4.0 03/08/2022   ALBUMIN 3.7 01/13/2022   ALBUMIN 3.7 12/30/2021    Lab Results  Component Value Date   MG 2.0 01/04/2022   MG 2.0 01/03/2022   MG 1.8 01/02/2022   Lab Results  Component Value Date   VD25OH 21.43 (L) 10/05/2017    No results found for: "PREALBUMIN"    Latest Ref Rng & Units 09/22/2022    2:52 PM 08/23/2022   10:19 AM 05/19/2022    2:33 PM  CBC EXTENDED  WBC 4.0 - 10.5 K/uL 6.3  10.2  7.0   RBC 3.87 - 5.11 MIL/uL 4.52  4.63  4.41   Hemoglobin 12.0 - 15.0 g/dL 38.7  56.4  11.9  HCT 36.0 - 46.0 % 38.3  39.5  36.7   Platelets 150 - 400 K/uL 243  304.0  244.0   NEUT# 1.4 - 7.7 K/uL  4.4  3.8   Lymph# 0.7 - 4.0 K/uL  4.7  2.6      There is no height or weight on file to calculate BMI.  Orders:  No orders of the defined types were placed in this encounter.  No orders of the defined types were placed in this encounter.    Procedures: No procedures performed  Clinical Data: No additional findings.  ROS:  All other systems negative, except as noted in the HPI. Review of Systems  Objective: Vital Signs: There were no vitals taken for this visit.  Specialty Comments:  MRI LUMBAR SPINE WITHOUT CONTRAST   TECHNIQUE: Multiplanar, multisequence MR imaging of the lumbar spine was performed. No intravenous contrast was administered.   COMPARISON:   Lumbar spine radiographs 01/27/2023   FINDINGS: Segmentation:  Standard.   Alignment:  3 mm anterolisthesis of L4 on L5.   Vertebrae: No fracture or suspicious marrow lesion. Mild degenerative facet edema at L4-5.   Conus medullaris and cauda equina: Conus extends to the L1 level. Conus and cauda equina appear normal.   Paraspinal and other soft tissues: Unremarkable.   Disc levels:   T12-L1: Minimal disc bulging, small left central disc protrusion, and mild right and moderate left facet and ligamentum flavum hypertrophy without stenosis.   L1-2: Minimal disc bulging, a shallow left paracentral to left subarticular disc protrusion, and moderate facet and ligamentum flavum hypertrophy without stenosis.   L2-3: Mild disc bulging and severe facet and ligamentum flavum hypertrophy without stenosis.   L3-4: Minimal disc bulging and severe facet and ligamentum flavum hypertrophy without stenosis.   L4-5: Disc desiccation. Anterolisthesis with mild disc uncovering and severe facet and ligamentum flavum hypertrophy without stenosis.   L5-S1: Disc desiccation and moderate disc space narrowing. Disc bulging, endplate spurring, and severe facet and ligamentum flavum hypertrophy result in mild bilateral lateral recess stenosis and moderate to severe bilateral neural foraminal stenosis without significant spinal stenosis.   IMPRESSION: 1. Severe diffuse lumbar facet arthrosis. 2. Moderate to severe bilateral neural foraminal stenosis at L5-S1. 3. No spinal stenosis.     Electronically Signed   By: Sebastian Ache M.D.   On: 03/07/2023 08:47  PMFS History: Patient Active Problem List   Diagnosis Date Noted   Hemorrhoid 03/23/2023   RVF (right ventricular failure) (HCC) 01/12/2023   Atopic dermatitis 01/12/2023   Shortness of breath 08/29/2022   Encounter for screening mammogram for malignant neoplasm of breast 08/29/2022   Right groin pain 01/13/2022   Bilateral pulmonary  embolism with right heart strain RV/LV ratio 1:5 (HCC) 12/31/2021   Acute respiratory failure with hypoxia (HCC) 12/31/2021   Total knee replacement status, left 12/09/21 12/09/2021   Unilateral primary osteoarthritis, left knee    Shoulder disorder 09/03/2021   Arthritis of knee 09/03/2021   Lumbar radiculopathy 09/03/2021   Abnormal MRI, knee 09/03/2021   Chronic pain of right knee 09/03/2021   Bronchitis 03/25/2021   Anemia 01/09/2020   HTN (hypertension) 12/31/2019   Chest pain 12/31/2019   DOE (dyspnea on exertion) 12/31/2019   Acute coronary syndrome (HCC) 12/31/2019   Unstable angina (HCC) 12/31/2019   Atherosclerosis 08/23/2018   Screening for tuberculosis 07/12/2018   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   Arthralgia 07/12/2018   Solitary pulmonary nodule 07/12/2018   Neck pain 02/15/2018   Dizziness 02/15/2018  Elevated TSH 02/15/2018   Screening for HIV (human immunodeficiency virus) 10/05/2017   Neuropathy 10/05/2017   Screening for colon cancer 10/05/2017   Hyperlipidemia 08/04/2016   Prediabetes 08/04/2016   Back pain 08/04/2016   Bilateral knee pain 06/18/2016   History of DVT of lower extremity 04/01/2014   History of DVT and PE (deep vein thrombosis) 05/04/2011   Past Medical History:  Diagnosis Date   Arthritis    DVT (deep venous thrombosis) (HCC) 2013   Dyspnea    due to knee pain   Family history of adverse reaction to anesthesia    Brother and sister hard to wake up.   GERD (gastroesophageal reflux disease)    History of blood transfusion    History of hypertension    Hypertension    Peripheral vascular disease (HCC)    DVT   Pre-diabetes    Pulmonary embolism (HCC) 2013    Family History  Problem Relation Age of Onset   Hypertension Father    Heart disease Father 28   Thyroid disease Sister    Allergies Sister    CVA Neg Hx    Heart attack Neg Hx    High Cholesterol Neg Hx    Colon cancer Neg Hx     Past Surgical History:  Procedure Laterality  Date   APPENDECTOMY     PULMONARY THROMBECTOMY Bilateral 01/01/2022   Procedure: PULMONARY THROMBECTOMY;  Surgeon: Renford Dills, MD;  Location: [facility] INVASIVE [facility];  Service: Cardiovascular;  Laterality: Bilateral;   TONSILLECTOMY AND ADENOIDECTOMY     TOTAL KNEE ARTHROPLASTY Left 12/09/2021   Procedure: LEFT TOTAL KNEE ARTHROPLASTY;  Surgeon: Nadara Mustard, MD;  Location[facility];  Service: Orthopedics;  Laterality: Left;   Social History   Occupational History   Not on file  Tobacco Use   Smoking status: Never   Smokeless tobacco: Never  Substance and Sexual Activity   Alcohol use: Yes    Comment: Ocassional glass of wine   Drug use: No   Sexual activity: Not on file

## 2023-04-06 ENCOUNTER — Other Ambulatory Visit: Payer: Self-pay

## 2023-04-06 MED FILL — Rosuvastatin Calcium Tab 5 MG: ORAL | 90 days supply | Qty: 90 | Fill #0 | Status: AC

## 2023-04-06 MED FILL — Ferrous Sulfate Tab 325 MG (65 MG Elemental Fe): ORAL | 90 days supply | Qty: 90 | Fill #0 | Status: AC

## 2023-04-19 ENCOUNTER — Other Ambulatory Visit: Payer: Self-pay

## 2023-04-25 ENCOUNTER — Other Ambulatory Visit: Payer: Self-pay

## 2023-04-28 ENCOUNTER — Other Ambulatory Visit: Payer: Self-pay

## 2023-05-03 ENCOUNTER — Other Ambulatory Visit: Payer: Self-pay

## 2023-06-08 ENCOUNTER — Telehealth: Payer: Self-pay

## 2023-06-08 DIAGNOSIS — I2699 Other pulmonary embolism without acute cor pulmonale: Secondary | ICD-10-CM

## 2023-06-08 NOTE — Telephone Encounter (Signed)
 Copied from CRM [id number]. Topic: Appointments - Scheduling Inquiry for Clinic >> Jun 08, 2023  3:27 PM [facility] ORN wrote: Reason for CRM: patient is having knee surgery on 06/22/23, patient wants to know if she needs to be seen or if she can be referred to a hematologists, due to her past experience for the same issue, she had to be seen in office and and then referred and it delayed her surgery patient does not want her surgery to be delayed. Please call patient back   [phone]

## 2023-06-09 MED ORDER — ENOXAPARIN SODIUM 100 MG/ML IJ SOSY
PREFILLED_SYRINGE | INTRAMUSCULAR | 0 refills | Status: DC
  Filled 2023-06-09: qty 4, 2d supply, fill #0

## 2023-06-09 NOTE — Addendum Note (Signed)
 Addended by: Calista Catching on: 06/09/2023 08:51 PM   Modules accepted: Orders

## 2023-06-09 NOTE — Telephone Encounter (Signed)
 Dr Jacobo  I wanted your advice here.  Patient is planning total right knee replacement Dr Harden 06/22/23 and called our office yesterday.    you previously saw patient prior to left TKR 12/09/2021.   She had bilateral pulmonary embolism after surgery 12/31/22, hospitalization.  She did not follow-up with you at that time.  In your previous preop note 12/02/21, you recommended:   Agree with previous recommendation of holding Eliquis  for 2 days prior to her knee replacement surgery with a standard Lovenox  bridge and then starting Eliquis  the day following surgery.    I had prescribed Lovenox  100 mg/ML every 12 hours for 2 days before surgery.   Please advise if bridge still appropriate.   Does she need to follow up with you as well?

## 2023-06-09 NOTE — Telephone Encounter (Signed)
 Call pt   I consulted with hematology, Dr.Finnegan  Advise patient below and ask to repeat back to you to ensure understanding. You may also send instructions via mychart if she would like   Hold Eliquis  5mg  BID for 2 days prior  ( stop on 06/19/23) to her knee replacement surgery on 06/22/23 . During time off Eliquis , she will take lovenox  injection two days prior to surgery on l. She will resume Eliquis  the day following surgery.     I had prescribed Lovenox  100 mg/ML every 12 hours for 2 days before surgery. Days 2/17-2/18.   No lovenox  on day of surgery.

## 2023-06-10 ENCOUNTER — Telehealth: Payer: Self-pay | Admitting: Family

## 2023-06-10 ENCOUNTER — Other Ambulatory Visit: Payer: Self-pay

## 2023-06-10 NOTE — Telephone Encounter (Signed)
 Spoke to pt went over message below in detail I had pt to repeat it back to me and I also sent pt my chart message so she can read them herself. Pt verbalized understanding.   I consulted with hematology, Dr.Finnegan   Advise patient below and ask to repeat back to you to ensure understanding. You may also send instructions via mychart if she would like     Hold Eliquis  5mg  BID for 2 days prior  ( stop on 06/19/23) to her knee replacement surgery on 06/22/23 . During time off Eliquis , she will take lovenox  injection two days prior to surgery on l. She will resume Eliquis  the day following surgery.      I had prescribed Lovenox  100 mg/ML every 12 hours for 2 days before surgery. Days 2/17-2/18.    No lovenox  on day of surgery

## 2023-06-14 ENCOUNTER — Other Ambulatory Visit: Payer: Self-pay | Admitting: Family

## 2023-06-14 ENCOUNTER — Other Ambulatory Visit: Payer: Self-pay

## 2023-06-14 DIAGNOSIS — I2699 Other pulmonary embolism without acute cor pulmonale: Secondary | ICD-10-CM

## 2023-06-14 MED FILL — Apixaban Tab 5 MG: ORAL | 90 days supply | Qty: 180 | Fill #0 | Status: AC

## 2023-06-16 NOTE — Progress Notes (Signed)
 Surgical Instructions   Your procedure is scheduled on Wednesday, February 19th, 2025. Report to [facility] Main Entrance "A" at 6:30 A.M., then check in with the Admitting office. Any questions or running late day of surgery: call [phone]  Questions prior to your surgery date: call [phone], Monday-Friday, 8am-4pm. If you experience any cold or flu symptoms such as cough, fever, chills, shortness of breath, etc. between now and your scheduled surgery, please notify us at the above number.     Remember:  Do not eat after midnight the night before your surgery  You may drink clear liquids until 5:30 the morning of your surgery.   Clear liquids allowed are: Water, Non-Citrus Juices (without pulp), Carbonated Beverages, Clear Tea (no milk, honey, etc.), Black Coffee Only (NO MILK, CREAM OR POWDERED CREAMER of any kind), and Gatorade.    Take these medicines the morning of surgery with A SIP OF WATER: Fluticasone-Umeclidin-Vilant (Trelegy Ellipta) Loratadine (Claritin) Prednisone (Deltasone)   May take these medicines IF NEEDED: Albuterol Inhaler (please bring with you the day of surgery)   Per your physician's instructions, your last dose of Eliquis should be Sunday, February 16th.  You should begin Lovenox injections two days prior to surgery - Monday, February 17th and you will do this every 12 hours on Monday and Tuesday.      One week prior to surgery, STOP taking any Aspirin (unless otherwise instructed by your surgeon) Aleve, Naproxen, Ibuprofen, Motrin, Advil, Goody's, BC's, all herbal medications, fish oil, and non-prescription vitamins.                     Do NOT Smoke (Tobacco/Vaping) for 24 hours prior to your procedure.  If you use a CPAP at night, you may bring your mask/headgear for your overnight stay.   You will be asked to remove any contacts, glasses, piercing's, hearing aid's, dentures/partials prior to surgery. Please bring cases for these items if  needed.    Patients discharged the day of surgery will not be allowed to drive home, and someone needs to stay with them for 24 hours.  SURGICAL WAITING ROOM VISITATION Patients may have no more than 2 support people in the waiting area - these visitors may rotate.   Pre-op nurse will coordinate an appropriate time for 1 ADULT support person, who may not rotate, to accompany patient in pre-op.  Children under the age of 17 must have an adult with them who is not the patient and must remain in the main waiting area with an adult.  If the patient needs to stay at the hospital during part of their recovery, the visitor guidelines for inpatient rooms apply.  Please refer to the [facility] website for the visitor guidelines for any additional information.   If you received a COVID test during your pre-op visit  it is requested that you wear a mask when out in public, stay away from anyone that may not be feeling well and notify your surgeon if you develop symptoms. If you have been in contact with anyone that has tested positive in the last 10 days please notify you surgeon.      Pre-operative 5 CHG Bathing Instructions   You can play a key role in reducing the risk of infection after surgery. Your skin needs to be as free of germs as possible. You can reduce the number of germs on your skin by washing with CHG (chlorhexidine gluconate) soap before surgery. CHG is an antiseptic soap  that kills germs and continues to kill germs even after washing.   DO NOT use if you have an allergy to chlorhexidine/CHG or antibacterial soaps. If your skin becomes reddened or irritated, stop using the CHG and notify one of our RNs at [phone].   Please shower with the CHG soap starting 4 days before surgery using the following schedule:     Please keep in mind the following:  DO NOT shave, including legs and underarms, starting the day of your first shower.   You may shave your face at any point  before/day of surgery.  Place clean sheets on your bed the day you start using CHG soap. Use a clean washcloth (not used since being washed) for each shower. DO NOT sleep with pets once you start using the CHG.   CHG Shower Instructions:  Wash your face and private area with normal soap. If you choose to wash your hair, wash first with your normal shampoo.  After you use shampoo/soap, rinse your hair and body thoroughly to remove shampoo/soap residue.  Turn the water OFF and apply about 3 tablespoons (45 ml) of CHG soap to a CLEAN washcloth.  Apply CHG soap ONLY FROM YOUR NECK DOWN TO YOUR TOES (washing for 3-5 minutes)  DO NOT use CHG soap on face, private areas, open wounds, or sores.  Pay special attention to the area where your surgery is being performed.  If you are having back surgery, having someone wash your back for you may be helpful. Wait 2 minutes after CHG soap is applied, then you may rinse off the CHG soap.  Pat dry with a clean towel  Put on clean clothes/pajamas   If you choose to wear lotion, please use ONLY the CHG-compatible lotions that are listed below.  Additional instructions for the day of surgery: DO NOT APPLY any lotions, deodorants, cologne, or perfumes.   Do not bring valuables to the hospital. [facility] is not responsible for any belongings/valuables. Do not wear nail polish, gel polish, artificial nails, or any other type of covering on natural nails (fingers and toes) Do not wear jewelry or makeup Put on clean/comfortable clothes.  Please brush your teeth.  Ask your nurse before applying any prescription medications to the skin.     CHG Compatible Lotions   Aveeno Moisturizing lotion  Cetaphil Moisturizing Cream  Cetaphil Moisturizing Lotion  Clairol Herbal Essence Moisturizing Lotion, Dry Skin  Clairol Herbal Essence Moisturizing Lotion, Extra Dry Skin  Clairol Herbal Essence Moisturizing Lotion, Normal Skin  Curel Age Defying Therapeutic  Moisturizing Lotion with Alpha Hydroxy  Curel Extreme Care Body Lotion  Curel Soothing Hands Moisturizing Hand Lotion  Curel Therapeutic Moisturizing Cream, Fragrance-Free  Curel Therapeutic Moisturizing Lotion, Fragrance-Free  Curel Therapeutic Moisturizing Lotion, Original Formula  Eucerin Daily Replenishing Lotion  Eucerin Dry Skin Therapy Plus Alpha Hydroxy Crme  Eucerin Dry Skin Therapy Plus Alpha Hydroxy Lotion  Eucerin Original Crme  Eucerin Original Lotion  Eucerin Plus Crme Eucerin Plus Lotion  Eucerin TriLipid Replenishing Lotion  Keri Anti-Bacterial Hand Lotion  Keri Deep Conditioning Original Lotion Dry Skin Formula Softly Scented  Keri Deep Conditioning Original Lotion, Fragrance Free Sensitive Skin Formula  Keri Lotion Fast Absorbing Fragrance Free Sensitive Skin Formula  Keri Lotion Fast Absorbing Softly Scented Dry Skin Formula  Keri Original Lotion  Keri Skin Renewal Lotion Keri Silky Smooth Lotion  Keri Silky Smooth Sensitive Skin Lotion  Nivea Body Creamy Conditioning Oil  Nivea Body Extra Enriched Lotion  [place]  Body Original Lotion  Nivea Body Sheer Moisturizing Lotion Nivea Crme  Nivea Skin Firming Lotion  NutraDerm 30 Skin Lotion  NutraDerm Skin Lotion  NutraDerm Therapeutic Skin Cream  NutraDerm Therapeutic Skin Lotion  ProShield Protective Hand Cream  Provon moisturizing lotion  Please read over the following fact sheets that you were given.

## 2023-06-17 ENCOUNTER — Encounter (HOSPITAL_COMMUNITY)
Admission: RE | Admit: 2023-06-17 | Discharge: 2023-06-17 | Disposition: A | Discharge status: Home / Self Care | Payer: No Typology Code available for payment source | Source: Ambulatory Visit | Attending: Orthopedic Surgery | Admitting: Orthopedic Surgery

## 2023-06-17 ENCOUNTER — Encounter (HOSPITAL_COMMUNITY): Payer: Self-pay

## 2023-06-17 ENCOUNTER — Other Ambulatory Visit: Payer: Self-pay

## 2023-06-17 VITALS — BP 128/77 | HR 84 | Temp 98.6°F | Resp 17 | Ht 65.0 in | Wt 239.0 lb

## 2023-06-17 DIAGNOSIS — Z01818 Encounter for other preprocedural examination: Secondary | ICD-10-CM | POA: Insufficient documentation

## 2023-06-17 DIAGNOSIS — I10 Essential (primary) hypertension: Secondary | ICD-10-CM | POA: Insufficient documentation

## 2023-06-17 LAB — CBC
HCT: 38.8 % (ref 36.0–46.0)
Hemoglobin: 12.4 g/dL (ref 12.0–15.0)
MCH: 27.4 pg (ref 26.0–34.0)
MCHC: 32 g/dL (ref 30.0–36.0)
MCV: 85.7 fL (ref 80.0–100.0)
Platelets: 239 10*3/uL (ref 150–400)
RBC: 4.53 MIL/uL (ref 3.87–5.11)
RDW: 13.7 % (ref 11.5–15.5)
WBC: 6.5 10*3/uL (ref 4.0–10.5)
nRBC: 0 % (ref 0.0–0.2)

## 2023-06-17 LAB — BASIC METABOLIC PANEL
Anion gap: 8 (ref 5–15)
BUN: 15 mg/dL (ref 8–23)
CO2: 24 mmol/L (ref 22–32)
Calcium: 9.5 mg/dL (ref 8.9–10.3)
Chloride: 109 mmol/L (ref 98–111)
Creatinine, Ser: 1.16 mg/dL — ABNORMAL HIGH (ref 0.44–1.00)
GFR, Estimated: 51 mL/min — ABNORMAL LOW (ref >60)
Glucose, Bld: 102 mg/dL — ABNORMAL HIGH (ref 70–99)
Potassium: 4 mmol/L (ref 3.5–5.1)
Sodium: 141 mmol/L (ref 135–145)

## 2023-06-17 LAB — SURGICAL PCR SCREEN
MRSA, PCR: NEGATIVE
Staphylococcus aureus: NEGATIVE

## 2023-06-17 NOTE — Progress Notes (Addendum)
PCP - Rennie Plowman, FNP Cardiologist - denies  PPM/ICD - denies   Chest x-ray - 09/01/22 EKG - 09/22/22 Stress Test - 2021 ECHO - 09/10/22 Cardiac Cath - denies  Sleep Study - denies   DM- denies  Last dose of GLP1 agonist-  n/a   Blood Thinner Instructions: Stop Eliquis 2/16. Take lovenox 2/17 and 2/18. Do not take Lovenox on day of surgery Aspirin Instructions: n/a  ERAS Protcol - clears until 0530   COVID TEST- n/a   Anesthesia review: yes, hx of DVTs- lovenox bridge  Patient denies shortness of breath, fever, cough and chest pain at PAT appointment   All instructions explained to the patient, with a verbal understanding of the material. Patient agrees to go over the instructions while at home for a better understanding.  The opportunity to ask questions was provided.

## 2023-06-17 NOTE — Progress Notes (Signed)
Surgical Instructions   Your procedure is scheduled on Wednesday, February 19th, 2025. Report to [facility] Main Entrance "A" at 6:30 A.M., then check in with the Admitting office. Any questions or running late day of surgery: call [phone]  Questions prior to your surgery date: call [phone], Monday-Friday, 8am-4pm. If you experience any cold or flu symptoms such as cough, fever, chills, shortness of breath, etc. between now and your scheduled surgery, please notify us at the above number.     Remember:  Do not eat after midnight the night before your surgery  You may drink clear liquids until 5:30 the morning of your surgery.   Clear liquids allowed are: Water, Non-Citrus Juices (without pulp), Carbonated Beverages, Clear Tea (no milk, honey, etc.), Black Coffee Only (NO MILK, CREAM OR POWDERED CREAMER of any kind), and Gatorade.    Take these medicines the morning of surgery with A SIP OF WATER: Loratadine (Claritin)   May take these medicines IF NEEDED: Albuterol Inhaler (please bring with you the day of surgery)   Per your physician's instructions, your last dose of Eliquis should be Sunday, February 16th.  You should begin Lovenox injections two days prior to surgery - Monday, February 17th and you will do this every 12 hours on Monday and Tuesday.   Do not take Lovenox morning of surgery.   One week prior to surgery, STOP taking any Aspirin (unless otherwise instructed by your surgeon) Aleve, Naproxen, Ibuprofen, Motrin, Advil, Goody's, BC's, all herbal medications, fish oil, and non-prescription vitamins.                     Do NOT Smoke (Tobacco/Vaping) for 24 hours prior to your procedure.  If you use a CPAP at night, you may bring your mask/headgear for your overnight stay.   You will be asked to remove any contacts, glasses, piercing's, hearing aid's, dentures/partials prior to surgery. Please bring cases for these items if needed.    Patients discharged the  day of surgery will not be allowed to drive home, and someone needs to stay with them for 24 hours.  SURGICAL WAITING ROOM VISITATION Patients may have no more than 2 support people in the waiting area - these visitors may rotate.   Pre-op nurse will coordinate an appropriate time for 1 ADULT support person, who may not rotate, to accompany patient in pre-op.  Children under the age of 79 must have an adult with them who is not the patient and must remain in the main waiting area with an adult.  If the patient needs to stay at the hospital during part of their recovery, the visitor guidelines for inpatient rooms apply.  Please refer to the [facility] website for the visitor guidelines for any additional information.   If you received a COVID test during your pre-op visit  it is requested that you wear a mask when out in public, stay away from anyone that may not be feeling well and notify your surgeon if you develop symptoms. If you have been in contact with anyone that has tested positive in the last 10 days please notify you surgeon.      Pre-operative 5 CHG Bathing Instructions   You can play a key role in reducing the risk of infection after surgery. Your skin needs to be as free of germs as possible. You can reduce the number of germs on your skin by washing with CHG (chlorhexidine gluconate) soap before surgery. CHG is an antiseptic  soap that kills germs and continues to kill germs even after washing.   DO NOT use if you have an allergy to chlorhexidine/CHG or antibacterial soaps. If your skin becomes reddened or irritated, stop using the CHG and notify one of our RNs at [phone].   Please shower with the CHG soap starting 4 days before surgery using the following schedule:     Please keep in mind the following:  DO NOT shave, including legs and underarms, starting the day of your first shower.   You may shave your face at any point before/day of surgery.  Place clean sheets  on your bed the day you start using CHG soap. Use a clean washcloth (not used since being washed) for each shower. DO NOT sleep with pets once you start using the CHG.   CHG Shower Instructions:  Wash your face and private area with normal soap. If you choose to wash your hair, wash first with your normal shampoo.  After you use shampoo/soap, rinse your hair and body thoroughly to remove shampoo/soap residue.  Turn the water OFF and apply about 3 tablespoons (45 ml) of CHG soap to a CLEAN washcloth.  Apply CHG soap ONLY FROM YOUR NECK DOWN TO YOUR TOES (washing for 3-5 minutes)  DO NOT use CHG soap on face, private areas, open wounds, or sores.  Pay special attention to the area where your surgery is being performed.  If you are having back surgery, having someone wash your back for you may be helpful. Wait 2 minutes after CHG soap is applied, then you may rinse off the CHG soap.  Pat dry with a clean towel  Put on clean clothes/pajamas   If you choose to wear lotion, please use ONLY the CHG-compatible lotions that are listed below.  Additional instructions for the day of surgery: DO NOT APPLY any lotions, deodorants, cologne, or perfumes.   Do not bring valuables to the hospital. [facility] is not responsible for any belongings/valuables. Do not wear nail polish, gel polish, artificial nails, or any other type of covering on natural nails (fingers and toes) Do not wear jewelry or makeup Put on clean/comfortable clothes.  Please brush your teeth.  Ask your nurse before applying any prescription medications to the skin.     CHG Compatible Lotions   Aveeno Moisturizing lotion  Cetaphil Moisturizing Cream  Cetaphil Moisturizing Lotion  Clairol Herbal Essence Moisturizing Lotion, Dry Skin  Clairol Herbal Essence Moisturizing Lotion, Extra Dry Skin  Clairol Herbal Essence Moisturizing Lotion, Normal Skin  Curel Age Defying Therapeutic Moisturizing Lotion with Alpha Hydroxy  Curel  Extreme Care Body Lotion  Curel Soothing Hands Moisturizing Hand Lotion  Curel Therapeutic Moisturizing Cream, Fragrance-Free  Curel Therapeutic Moisturizing Lotion, Fragrance-Free  Curel Therapeutic Moisturizing Lotion, Original Formula  Eucerin Daily Replenishing Lotion  Eucerin Dry Skin Therapy Plus Alpha Hydroxy Crme  Eucerin Dry Skin Therapy Plus Alpha Hydroxy Lotion  Eucerin Original Crme  Eucerin Original Lotion  Eucerin Plus Crme Eucerin Plus Lotion  Eucerin TriLipid Replenishing Lotion  Keri Anti-Bacterial Hand Lotion  Keri Deep Conditioning Original Lotion Dry Skin Formula Softly Scented  Keri Deep Conditioning Original Lotion, Fragrance Free Sensitive Skin Formula  Keri Lotion Fast Absorbing Fragrance Free Sensitive Skin Formula  Keri Lotion Fast Absorbing Softly Scented Dry Skin Formula  Keri Original Lotion  Keri Skin Renewal Lotion Keri Silky Smooth Lotion  Keri Silky Smooth Sensitive Skin Lotion  Nivea Body Creamy Conditioning Oil  Nivea Body Extra Enriched Lotion  Nivea Body Original Lotion  Nivea Body Sheer Moisturizing Lotion Nivea Crme  Nivea Skin Firming Lotion  NutraDerm 30 Skin Lotion  NutraDerm Skin Lotion  NutraDerm Therapeutic Skin Cream  NutraDerm Therapeutic Skin Lotion  ProShield Protective Hand Cream  Provon moisturizing lotion  Please read over the following fact sheets that you were given.

## 2023-06-20 ENCOUNTER — Encounter (HOSPITAL_COMMUNITY): Payer: Self-pay | Admitting: Physician Assistant

## 2023-06-21 ENCOUNTER — Telehealth: Payer: Self-pay | Admitting: Family

## 2023-06-21 ENCOUNTER — Telehealth: Payer: Self-pay

## 2023-06-21 NOTE — Progress Notes (Signed)
 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  She has had a negative hypercoagulable workup in the past.  She was most recently seen in [facility] in late 2023 for shortness of breath after missing a few doses of Eliquis. Chest CT at that time demonstrated bilateral PEs with RV strain. She underwent thrombectomy by vascular surgery with follow-up echocardiogram demonstrating preserved RV and LV function.   She was evaluated by cardiologist Dr. Gasper Lloyd on 09/22/2022 for DOE and RV dysfunction.  Per note, "Patient has a history of DVTs & PE in 2014 and 2023 with RV strain on CT (now s/p thrombectomy); personally reviewed CT from time of her PE. LV:RV, contrast reflux into IVC and dilation of hepatic veins is all consistent with RV dysfunction. - V/Q scan on 09/01/22 w/ abnormal perfusion in both lungs; scattered subsegmental perfusion defects. - Due to persistent SOB and mild RV dysfunction will plan on RHC to rule out CTEPH."  Unfortunately, it does not look like she had this testing or had any follow-up with cardiology.  Given that this is elective surgery, recommend patient have cardiology input prior to proceeding.  I have relayed this to Dr. Audrie Lia office.  Preop labs reviewed, creatinine mildly elevated 1.16, otherwise unremarkable.  EKG 09/22/2022: Normal sinus rhythm.  Rate 88. Nonspecific T wave abnormality  TTE 09/10/2022: 1. Left ventricular ejection fraction, by estimation, is 60 to 65%. The  left ventricle has normal function. The left ventricle has no regional  wall motion abnormalities. Left ventricular diastolic parameters are  consistent with Grade I diastolic  dysfunction (impaired relaxation).   2. Right ventricular systolic function is mildly reduced, though not well  visualized. The right ventricular size is grossly normal.   3. Moderate pleural effusion in the left lateral region.   4. The mitral valve is normal in structure. No evidence of mitral  valve  regurgitation. No evidence of mitral stenosis.   5. The aortic valve is tricuspid. Aortic valve regurgitation is not  visualized. No aortic stenosis is present.   6. There is borderline dilatation of the aortic root, measuring 38 mm.   7. The inferior vena cava is normal in size with greater than 50%  respiratory variability, suggesting right atrial pressure of 3 mmHg.    [facility] Short Stay Center/Anesthesiology Phone [phone] 06/21/2023 4:08 PM

## 2023-06-21 NOTE — Telephone Encounter (Signed)
 Copied from CRM [id number]. Topic: General - Other >> Jun 21, 2023  2:02 PM Adele Barthel wrote: Reason for CRM:   Patient is scheduled for knee surgery on 06/22/2023. However, her surgery was canceled due to the anesthesiologist not clearing her. The anesthesiologist reported this was due to the provider recommending a heart catheterization in a note from 01/2023. Patient reports provider originally recommended this procedure in 09/2022 and referred her to cardiologist. Patient refused to have catheterization due to feeling fine and not wanting to put her body through the procedure.   Patient is wanting to know if there is anything the provider can do to assist with getting her surgery cleared, due to having already scheduled caretakers to come in and help her while recovering.  CB# [phone]

## 2023-06-21 NOTE — Telephone Encounter (Unsigned)
 Copied from CRM [id number]. Topic: Appointments - Scheduling Inquiry for Clinic >> Jun 21, 2023  4:33 PM Mosetta Putt H wrote: Reason for CRM: patient needs to be referred to a heart catheterization, please call back patient in reference to a medication that son has questions about

## 2023-06-22 ENCOUNTER — Ambulatory Visit (HOSPITAL_COMMUNITY)
Admission: RE | Admit: 2023-06-22 | Payer: No Typology Code available for payment source | Source: Ambulatory Visit | Admitting: Orthopedic Surgery

## 2023-06-22 ENCOUNTER — Encounter (HOSPITAL_COMMUNITY): Admission: RE | Payer: Self-pay | Source: Ambulatory Visit

## 2023-06-22 SURGERY — TOTAL KNEE ARTHROPLASTY
Anesthesia: Choice | Site: Knee | Laterality: Right

## 2023-06-22 NOTE — Telephone Encounter (Signed)
 LVM to call back to inform pt son . Please pass along message when son calls back  . She should immediately resume Eliquis twice daily.

## 2023-06-22 NOTE — Telephone Encounter (Signed)
 Spoke to pt son and he stated that the surgery was suppose to be today 06/22/23 but it was postponed awaiting to hear back from the Cardiologist. He would also like to know if his Mom should start back taking her medication regularly as well or not?

## 2023-06-23 ENCOUNTER — Ambulatory Visit
Admission: EM | Admit: 2023-06-23 | Discharge: 2023-06-23 | Disposition: A | Discharge status: Home / Self Care | Payer: No Typology Code available for payment source | Attending: Emergency Medicine | Admitting: Emergency Medicine

## 2023-06-23 DIAGNOSIS — T24211A Burn of second degree of right thigh, initial encounter: Secondary | ICD-10-CM

## 2023-06-23 MED ORDER — DOXYCYCLINE HYCLATE 100 MG PO CAPS: 100.0000 mg | ORAL_CAPSULE | Freq: Two times a day (BID) | ORAL | 0 refills | Status: DC

## 2023-06-23 NOTE — Telephone Encounter (Signed)
 LVM to call back to inform pt son . Please pass along message when son calls back  . She should immediately resume Eliquis twice daily.
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
## 2023-06-23 NOTE — ED Triage Notes (Signed)
 Triaged by provider

## 2023-06-24 NOTE — Telephone Encounter (Signed)
 Thank you Dr Gasper Lloyd  I have included Dr Lajoyce Corners on this conversation.   Dr Lajoyce Corners, see note from Dr Gasper Lloyd. Can surgery be rescheduled?   [facility] team  Please call pt and inform her that we head back from cardiology whom did not advice cardiac cath unless she has concerns for cardiac symptoms including unusual fatigue, shortness of breath, CP.   I have included Dr. Lajoyce Corners, orthopedic, on this this message. She may call his office to reschedule.

## 2023-06-24 NOTE — Telephone Encounter (Signed)
 Spoke to pt she did resume the Eliquis as instructed

## 2023-06-24 NOTE — Telephone Encounter (Signed)
 Pt has been notified.

## 2023-06-27 ENCOUNTER — Encounter: Payer: Self-pay | Admitting: Family

## 2023-06-27 ENCOUNTER — Ambulatory Visit (INDEPENDENT_AMBULATORY_CARE_PROVIDER_SITE_OTHER): Payer: No Typology Code available for payment source | Admitting: Family

## 2023-06-27 ENCOUNTER — Other Ambulatory Visit: Payer: Self-pay

## 2023-06-27 VITALS — BP 130/70 | HR 94 | Temp 98.0°F | Ht 66.0 in | Wt 243.0 lb

## 2023-06-27 DIAGNOSIS — M25561 Pain in right knee: Secondary | ICD-10-CM

## 2023-06-27 DIAGNOSIS — I7090 Unspecified atherosclerosis: Secondary | ICD-10-CM

## 2023-06-27 DIAGNOSIS — I10 Essential (primary) hypertension: Secondary | ICD-10-CM

## 2023-06-27 DIAGNOSIS — T300 Burn of unspecified body region, unspecified degree: Secondary | ICD-10-CM | POA: Insufficient documentation

## 2023-06-27 DIAGNOSIS — G8929 Other chronic pain: Secondary | ICD-10-CM

## 2023-06-27 DIAGNOSIS — E119 Type 2 diabetes mellitus without complications: Secondary | ICD-10-CM

## 2023-06-27 MED ORDER — TRAMADOL HCL 50 MG PO TABS
50.0000 mg | ORAL_TABLET | Freq: Every day | ORAL | 2 refills | Status: DC | PRN
  Filled 2023-06-27: qty 30, 30d supply, fill #0
  Filled 2023-07-13 – 2023-07-27 (×2): qty 30, 30d supply, fill #1
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
## 2023-06-27 NOTE — Assessment & Plan Note (Signed)
 Chronic, stable.  Awaiting total knee replacement.  Provided refill for tramadol
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
## 2023-06-27 NOTE — Patient Instructions (Addendum)
 Please call to schedule a follow up, cardiology  Dr Dorthula Nettles  Phone  [phone]   I have refilled tramadol for pain.  Once you have rescheduled right knee replacement, please let me know so I may refill lovenox for bridge   You may stop iron as you are not anemic.   Nice to see you!

## 2023-06-27 NOTE — Progress Notes (Signed)
 Assessment & Plan:  Diabetes mellitus without complication (HCC) -     Hemoglobin A1c; Future -     Lipid panel; Future  Hypertension, unspecified type  Chronic pain of right knee Assessment & Plan: Chronic, stable.  Awaiting total knee replacement.  Provided refill for tramadol   Orders: -     traMADol HCl; Take 1 tablet (50 mg total) by mouth daily as needed.  Dispense: 30 tablet; Refill: 2  Atherosclerosis Assessment & Plan: Known aortic atherosclerosis.  Compliant with Crestor 5 mg nightly.  Pending lipid panel.  Discussed recent postponement of right knee surgery.  Advised to follow-up with cardiology.  Discussed cardiology advice in regards to consideration for repeat echocardiogram.  Patient will call to schedule.    Burn Assessment & Plan: No systemic features nor symptoms of infection at this time.  Advised to go ahead and complete doxycycline due to the extent of the partial-thickness burn.  She will continue daily dressing changes.  Politely declines referral to wound care at this time.  She will let me know of concerns      Return precautions given.   Risks, benefits, and alternatives of the medications and treatment plan prescribed today were discussed, and patient expressed understanding.   Education regarding symptom management and diagnosis given to patient on AVS either electronically or printed.  Return in about 3 months (around 09/24/2023) for Fasting labs in 2-3 weeks.  Rennie Plowman, FNP  Subjective:    Patient ID: Ernestene Coover, female    DOB: Jun 07, 1950, 73 y.o.   MRN: [id number]  CC: Jeanell Augello is a 73 y.o. female who presents today for an acute visit.    HPI: Feels well today No new complaints.   Right thigh wound improved.  Denies purulent discharge, fever, chills.  Pain is tolerable, improved.   Seen at [facility] 06/23/23 for partial thickness to right thigh.  Cleaned with Silvadene . started on doxycycline.  Anemia resolved.    Continues to have right knee pain. Surgery to be rescheduled with Dr Lajoyce Corners  Request refill of tramadol .    Allergies: Sulfa antibiotics Current Outpatient Medications on File Prior to Visit  Medication Sig Dispense Refill   albuterol (VENTOLIN HFA) 108 (90 Base) MCG/ACT inhaler Inhale 2 puffs into the lungs every 6 (six) hours as needed for wheezing or shortness of breath. 6.7 g 0   apixaban (ELIQUIS) 5 MG TABS tablet Take 1 tablet (5 mg total) by mouth 2 (two) times daily. 180 tablet 3   calcium carbonate (TUMS - DOSED IN MG ELEMENTAL CALCIUM) 500 MG chewable tablet Chew 1 tablet by mouth daily as needed for indigestion or heartburn.     doxycycline (VIBRAMYCIN) 100 MG capsule Take 1 capsule (100 mg total) by mouth 2 (two) times daily. 14 capsule 0   gabapentin (NEURONTIN) 100 MG capsule Take 2 capsules (200 mg total) by mouth at bedtime. 90 capsule 3   loratadine (CLARITIN) 10 MG tablet Take 10 mg by mouth daily.     Menthol, Topical Analgesic, (BIOFREEZE EX) Apply 1 Application topically daily as needed (leg pain).     Multiple Vitamin (MULTIVITAMIN WITH MINERALS) TABS tablet Take 1 tablet by mouth 2 (two) times a week.     rosuvastatin (CRESTOR) 5 MG tablet Take 1 tablet (5 mg total) by mouth at bedtime. 90 tablet 3   triamcinolone ointment (KENALOG) 0.5 % Apply 1 Application topically 2 (two) times daily. Use sparingly for < 1 week, left ankle 15  g 2   enoxaparin (LOVENOX) 100 MG/ML injection Inject 1 mL (100 mg total) into the skin every 12 (twelve) hours for 2 days. Start 2 days before total knee replacement (Patient not taking: Reported on 06/27/2023) 4 mL 0   predniSONE (DELTASONE) 10 MG tablet Take 1 tablet (10 mg total) by mouth daily with breakfast. (Patient not taking: Reported on 06/27/2023) 30 tablet 0   No current facility-administered medications on file prior to visit.    Review of Systems  Constitutional:  Negative for chills and fever.  Respiratory:  Negative for cough.    Cardiovascular:  Negative for chest pain and palpitations.  Gastrointestinal:  Negative for nausea and vomiting.  Skin:  Positive for wound.      Objective:    BP 130/70   Pulse 94   Temp 98 F (36.7 C) (Oral)   Ht 5\' 6"  (1.676 m)   Wt 243 lb (110.2 kg)   SpO2 98%   BMI 39.22 kg/m   BP Readings from Last 3 Encounters:  06/27/23 130/70  06/23/23 (!) 150/94  06/17/23 128/77   Wt Readings from Last 3 Encounters:  06/27/23 243 lb (110.2 kg)  06/17/23 239 lb (108.4 kg)  03/23/23 242 lb 3.2 oz (109.9 kg)    Physical Exam Vitals reviewed.  Constitutional:      Appearance: She is well-developed.  Eyes:     Conjunctiva/sclera: Conjunctivae normal.  Cardiovascular:     Rate and Rhythm: Normal rate and regular rhythm.     Pulses: Normal pulses.     Heart sounds: Normal heart sounds.  Pulmonary:     Effort: Pulmonary effort is normal.     Breath sounds: Normal breath sounds. No wheezing, rhonchi or rales.  Skin:    General: Skin is warm and dry.     Comments: 7cm partial thickness burn right lateral thigh.  No bogginess around wound.  Purulent discharge, increased heat  Neurological:     Mental Status: She is alert.  Psychiatric:        Speech: Speech normal.        Behavior: Behavior normal.        Thought Content: Thought content normal.

## 2023-06-27 NOTE — Assessment & Plan Note (Signed)
 Known aortic atherosclerosis.  Compliant with Crestor 5 mg nightly.  Pending lipid panel.  Discussed recent postponement of right knee surgery.  Advised to follow-up with cardiology.  Discussed cardiology advice in regards to consideration for repeat echocardiogram.  Patient will call to schedule.

## 2023-06-28 ENCOUNTER — Other Ambulatory Visit: Payer: Self-pay

## 2023-06-30 ENCOUNTER — Other Ambulatory Visit: Payer: Self-pay

## 2023-07-01 ENCOUNTER — Other Ambulatory Visit: Payer: Self-pay

## 2023-07-07 ENCOUNTER — Other Ambulatory Visit: Payer: Self-pay

## 2023-07-11 ENCOUNTER — Telehealth: Payer: Self-pay

## 2023-07-11 NOTE — Telephone Encounter (Signed)
 Received a message from Ebony Hail, [facility] at [facility] that they had received patient assistance medications for patient.  Eliquis  5mg  60 tablets x 3 bottles.  Lot: [id number] Expiration: 05/02/25.  Writer picked up the medications.  Did not see documentation that patient worked with our pharmacist.  Jeanene Erb pt who reported that she completed application online.  Let her know that medications were here and that she could pick them up at her convenience.

## 2023-07-12 NOTE — Telephone Encounter (Signed)
 noted

## 2023-07-13 ENCOUNTER — Other Ambulatory Visit: Payer: Self-pay

## 2023-07-13 MED FILL — Rosuvastatin Calcium Tab 5 MG: ORAL | 90 days supply | Qty: 90 | Fill #1 | Status: AC

## 2023-07-15 ENCOUNTER — Other Ambulatory Visit: Payer: Self-pay

## 2023-07-19 ENCOUNTER — Other Ambulatory Visit: Payer: Self-pay

## 2023-07-27 ENCOUNTER — Other Ambulatory Visit: Payer: Self-pay

## 2023-07-27 ENCOUNTER — Encounter: Payer: Self-pay | Admitting: Family

## 2023-07-27 ENCOUNTER — Ambulatory Visit (INDEPENDENT_AMBULATORY_CARE_PROVIDER_SITE_OTHER): Payer: Self-pay | Admitting: Family

## 2023-07-27 DIAGNOSIS — M79605 Pain in left leg: Secondary | ICD-10-CM

## 2023-07-27 DIAGNOSIS — M545 Low back pain, unspecified: Secondary | ICD-10-CM

## 2023-07-27 DIAGNOSIS — M5416 Radiculopathy, lumbar region: Secondary | ICD-10-CM

## 2023-07-27 MED ORDER — OXYCODONE-ACETAMINOPHEN 5-325 MG PO TABS
1.0000 | ORAL_TABLET | Freq: Four times a day (QID) | ORAL | 0 refills | Status: DC | PRN
  Filled 2023-07-27: qty 20, 5d supply, fill #0

## 2023-07-27 NOTE — Progress Notes (Signed)
 Office Visit Note   Patient: Leah Roberts           Date of Birth: Dec 30, 1950           MRN: [id number] Visit Date: 07/27/2023              Requested by: Allegra Grana, FNP [address] [place],  [place] [address] PCP: Allegra Grana, FNP  Chief Complaint  Patient presents with   Knee Pain    Left knee pain      HPI: The patient is a 73 year old woman who presents today complaining of acute on chronic left knee pain she is having symptoms to the lateral knee which radiate into the lower leg she is also having lateral thigh symptoms she describes it as shooting burning electrical pain occasionally this radiates into her hip and low back.  She describes difficulty walking reports that the leg feels heavy difficulty sleeping due to pain she especially has pain when lying on either side  She has currently been trying Voltaren as well as tramadol the tramadol eases her pain minimally.  No relief from the Voltaren she must ambulate with a straight cane  She is quite frustrated by her symptoms  Reports she has had a injection previously in her spine and unfortunately this only provided her with very short-term about 1 day of relief  Is hopeful to proceed with total knee arthroplasty on the right once she can resolve her symptoms of the left lower extremity  Assessment & Plan: Visit Diagnoses: No diagnosis found.  Plan: Discussed lumbar radiculopathy.  The patient is hesitant to have a repeat injection as the previous injection did not provide her with relief.  Although she is in agreement with having a visit with Dr. Alvester Morin to discuss her symptoms.  She has a current MRI of the lumbar spine  No identifiable focal knee pathology  Follow-Up Instructions: Return in about 4 weeks (around 08/24/2023), or if symptoms worsen or fail to improve.   Right Knee Exam   Comments:  Valgus alignment   Left Knee Exam   Muscle Strength  The patient has normal  left knee strength.  Tenderness  The patient is experiencing no tenderness.   Tests  Varus: negative Valgus: negative  Other  Effusion: no effusion present   Back Exam   Tenderness  The patient is experiencing no tenderness.   Muscle Strength  The patient has normal back strength.  Tests  Straight leg raise left: positive  Other  Gait: abnormal  Erythema: no back redness      Patient is alert, oriented, no adenopathy, well-dressed, normal affect, normal respiratory effort.   Imaging: No results found. No images are attached to the encounter.  Labs: Lab Results  Component Value Date   HGBA1C 6.1 (A) 01/12/2023   HGBA1C 6.7 (H) 05/19/2022   HGBA1C 6.2 02/15/2018   ESRSEDRATE 10 05/29/2020   CRP <1.0 05/29/2020     Lab Results  Component Value Date   ALBUMIN 4.0 03/08/2022   ALBUMIN 3.7 01/13/2022   ALBUMIN 3.7 12/30/2021    Lab Results  Component Value Date   MG 2.0 01/04/2022   MG 2.0 01/03/2022   MG 1.8 01/02/2022   Lab Results  Component Value Date   VD25OH 21.43 (L) 10/05/2017    No results found for: "PREALBUMIN"    Latest Ref Rng & Units 06/17/2023    2:02 PM 09/22/2022    2:52 PM 08/23/2022  10:19 AM  CBC EXTENDED  WBC 4.0 - 10.5 K/uL 6.5  6.3  10.2   RBC 3.87 - 5.11 MIL/uL 4.53  4.52  4.63   Hemoglobin 12.0 - 15.0 g/dL 40.9  81.1  91.4   HCT 36.0 - 46.0 % 38.8  38.3  39.5   Platelets 150 - 400 K/uL 239  243  304.0   NEUT# 1.4 - 7.7 K/uL   4.4   Lymph# 0.7 - 4.0 K/uL   4.7      There is no height or weight on file to calculate BMI.  Orders:  No orders of the defined types were placed in this encounter.  Meds ordered this encounter  Medications   oxyCODONE-acetaminophen (PERCOCET/ROXICET) 5-325 MG tablet    Sig: Take 1 tablet by mouth every 6 (six) hours as needed for severe pain (pain score 7-10).    Dispense:  20 tablet    Refill:  0     Procedures: No procedures performed  Clinical Data: No additional  findings.  ROS:  All other systems negative, except as noted in the HPI. Review of Systems  Objective: Vital Signs: There were no vitals taken for this visit.  Specialty Comments:  MRI LUMBAR SPINE WITHOUT CONTRAST   TECHNIQUE: Multiplanar, multisequence MR imaging of the lumbar spine was performed. No intravenous contrast was administered.   COMPARISON:  Lumbar spine radiographs 01/27/2023   FINDINGS: Segmentation:  Standard.   Alignment:  3 mm anterolisthesis of L4 on L5.   Vertebrae: No fracture or suspicious marrow lesion. Mild degenerative facet edema at L4-5.   Conus medullaris and cauda equina: Conus extends to the L1 level. Conus and cauda equina appear normal.   Paraspinal and other soft tissues: Unremarkable.   Disc levels:   T12-L1: Minimal disc bulging, small left central disc protrusion, and mild right and moderate left facet and ligamentum flavum hypertrophy without stenosis.   L1-2: Minimal disc bulging, a shallow left paracentral to left subarticular disc protrusion, and moderate facet and ligamentum flavum hypertrophy without stenosis.   L2-3: Mild disc bulging and severe facet and ligamentum flavum hypertrophy without stenosis.   L3-4: Minimal disc bulging and severe facet and ligamentum flavum hypertrophy without stenosis.   L4-5: Disc desiccation. Anterolisthesis with mild disc uncovering and severe facet and ligamentum flavum hypertrophy without stenosis.   L5-S1: Disc desiccation and moderate disc space narrowing. Disc bulging, endplate spurring, and severe facet and ligamentum flavum hypertrophy result in mild bilateral lateral recess stenosis and moderate to severe bilateral neural foraminal stenosis without significant spinal stenosis.   IMPRESSION: 1. Severe diffuse lumbar facet arthrosis. 2. Moderate to severe bilateral neural foraminal stenosis at L5-S1. 3. No spinal stenosis.     Electronically Signed   By: Sebastian Ache  M.D.   On: 03/07/2023 08:47  PMFS History: Patient Active Problem List   Diagnosis Date Noted   Burn 06/27/2023   Hemorrhoid 03/23/2023   RVF (right ventricular failure) (HCC) 01/12/2023   Atopic dermatitis 01/12/2023   Shortness of breath 08/29/2022   Encounter for screening mammogram for malignant neoplasm of breast 08/29/2022   Right groin pain 01/13/2022   Bilateral pulmonary embolism with right heart strain RV/LV ratio 1:5 (HCC) 12/31/2021   Acute respiratory failure with hypoxia (HCC) 12/31/2021   Total knee replacement status, left 12/09/21 12/09/2021   Unilateral primary osteoarthritis, left knee    Shoulder disorder 09/03/2021   Arthritis of knee 09/03/2021   Lumbar radiculopathy 09/03/2021   Abnormal MRI, knee  09/03/2021   Chronic pain of right knee 09/03/2021   Bronchitis 03/25/2021   Anemia 01/09/2020   HTN (hypertension) 12/31/2019   Chest pain 12/31/2019   DOE (dyspnea on exertion) 12/31/2019   Acute coronary syndrome (HCC) 12/31/2019   Unstable angina (HCC) 12/31/2019   Atherosclerosis 08/23/2018   Screening for tuberculosis 07/12/2018   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   Arthralgia 07/12/2018   Solitary pulmonary nodule 07/12/2018   Neck pain 02/15/2018   Dizziness 02/15/2018   Elevated TSH 02/15/2018   Screening for HIV (human immunodeficiency virus) 10/05/2017   Neuropathy 10/05/2017   Screening for colon cancer 10/05/2017   Hyperlipidemia 08/04/2016   Prediabetes 08/04/2016   Back pain 08/04/2016   Bilateral knee pain 06/18/2016   History of DVT of lower extremity 04/01/2014   History of DVT and PE (deep vein thrombosis) 05/04/2011   Past Medical History:  Diagnosis Date   Arthritis    DVT (deep venous thrombosis) (HCC) 2013   Dyspnea    due to knee pain   GERD (gastroesophageal reflux disease)    History of blood transfusion    History of hypertension    Hypertension    Peripheral vascular disease (HCC)    DVT   Pre-diabetes    Pulmonary embolism  (HCC) 2013    Family History  Problem Relation Age of Onset   Hypertension Father    Heart disease Father 54   Thyroid disease Sister    Allergies Sister    CVA Neg Hx    Heart attack Neg Hx    High Cholesterol Neg Hx    Colon cancer Neg Hx     Past Surgical History:  Procedure Laterality Date   APPENDECTOMY     PULMONARY THROMBECTOMY Bilateral 01/01/2022   Procedure: PULMONARY THROMBECTOMY;  Surgeon: Renford Dills, MD;  Location: [facility] INVASIVE CV LAB;  Service: Cardiovascular;  Laterality: Bilateral;   TONSILLECTOMY AND ADENOIDECTOMY     TOTAL KNEE ARTHROPLASTY Left 12/09/2021   Procedure: LEFT TOTAL KNEE ARTHROPLASTY;  Surgeon: Nadara Mustard, MD;  Location: [facility] OR;  Service: Orthopedics;  Laterality: Left;   Social History   Occupational History   Not on file  Tobacco Use   Smoking status: Never   Smokeless tobacco: Never  Vaping Use   Vaping status: Never Used  Substance and Sexual Activity   Alcohol use: Yes    Comment: Ocassional glass of wine   Drug use: No   Sexual activity: Not on file

## 2023-07-28 ENCOUNTER — Other Ambulatory Visit: Payer: Self-pay

## 2023-08-08 ENCOUNTER — Telehealth: Payer: Self-pay | Admitting: Physical Medicine and Rehabilitation

## 2023-08-08 ENCOUNTER — Other Ambulatory Visit: Payer: Self-pay | Admitting: Family

## 2023-08-08 DIAGNOSIS — M5416 Radiculopathy, lumbar region: Secondary | ICD-10-CM

## 2023-08-08 DIAGNOSIS — M545 Low back pain, unspecified: Secondary | ICD-10-CM

## 2023-08-08 NOTE — Telephone Encounter (Signed)
 Patient called needing to make an appointment for her left leg pain. CB#[phone]

## 2023-08-09 ENCOUNTER — Telehealth: Payer: Self-pay | Admitting: Physical Medicine and Rehabilitation

## 2023-08-09 NOTE — Telephone Encounter (Signed)
 Pt would like to reschedule appt until next week

## 2023-08-10 ENCOUNTER — Ambulatory Visit: Admitting: Physical Medicine and Rehabilitation

## 2023-08-15 ENCOUNTER — Encounter: Payer: Self-pay | Admitting: Physical Medicine and Rehabilitation

## 2023-08-15 ENCOUNTER — Ambulatory Visit: Payer: Self-pay | Admitting: Physical Medicine and Rehabilitation

## 2023-08-15 DIAGNOSIS — G8929 Other chronic pain: Secondary | ICD-10-CM

## 2023-08-15 DIAGNOSIS — M48061 Spinal stenosis, lumbar region without neurogenic claudication: Secondary | ICD-10-CM

## 2023-08-15 DIAGNOSIS — M47816 Spondylosis without myelopathy or radiculopathy, lumbar region: Secondary | ICD-10-CM

## 2023-08-15 DIAGNOSIS — M5442 Lumbago with sciatica, left side: Secondary | ICD-10-CM

## 2023-08-15 NOTE — Progress Notes (Unsigned)
 Pain Scale   Average Pain 8 Patient advising she has left side lower back pain radiating to left knee.        +Driver, -BT, -Dye Allergies.

## 2023-08-15 NOTE — Progress Notes (Unsigned)
 Core Outcome Measures Index (COMI) Back Score  Average Pain 9  COMI Score 90 %

## 2023-08-15 NOTE — Progress Notes (Unsigned)
 Leah Roberts - 73 y.o. female MRN [id number]  Date of birth: 08-May-1950  Office Visit Note: Visit Date: 08/15/2023 PCP: Allegra Grana, FNP Referred by: Allegra Grana, FNP  Subjective: Chief Complaint  Patient presents with   Lower Back - Pain   HPI: Leah Roberts is a 73 y.o. female who comes in today per the request of Barnie Del, NP for evaluation of chronic, worsening and severe left sided back pain radiating to buttock and lateral leg down to ankle. Pain ongoing for over a year, worsens with walking and activity. She describes her pain as sore and sharp in nature, currently rates as 8 out of 10. Also reports intermittent electrical burning and shocking sensation down left leg. Some relief of pain with home exercise regimen, rest and use of medications. Some relief of pain with Percocet. Lumbar MRI imaging from November of 2024 shows moderate to severe bilateral neural foraminal stenosis at L5-S1. There is also severe diffuse lumbar facet arthrosis. She underwent left L5 transforaminal epidural steroid injection in our office on 03/15/2023. She reports mild relief of pain for about 3-4 days, she reports not significant relief of symptoms post injection. She is managed by orthopedic standpoint by Dr. Aldean Baker. She is currently using cane to assist with ambulation. Patient denies focal weakness, numbness and tingling. No recent falls.      Review of Systems  Musculoskeletal:  Positive for back pain.  Neurological:  Negative for tingling, sensory change, focal weakness and weakness.  All other systems reviewed and are negative.  Otherwise per HPI.  Assessment & Plan: Visit Diagnoses:    ICD-10-CM   1. Chronic left-sided low back pain with left-sided sciatica  M54.42 Ambulatory referral to Physical Medicine Rehab   G89.29     2. Foraminal stenosis of lumbar region  M48.061 Ambulatory referral to Physical Medicine Rehab    3. Facet arthropathy, lumbar   M47.816 Ambulatory referral to Physical Medicine Rehab       Plan: Findings:  Chronic, worsening and severe left sided back pain radiating to buttock and lateral leg down to ankle. Patient continues to have severe pain despite good conservative therapies such as home exercise regimen, rest and use of medications. Patients clinical presentation and exam are consistent with L5 nerve pattern. We discussed treatment plan in detail today. I explained to her that we could try an interlaminar approach for injection. I did go ahead and place order for diagnostic and hopefully therapeutic left L5-S1 interlaminar epidural steroid injection. She is currently taking Eliquis, we will get permission for her to discontinue prior to injection. If good relief of pain with injection we can repeat this procedure as needed. She has no questions at this time. No red flag symptoms noted upon exam today.     Meds & Orders: No orders of the defined types were placed in this encounter.   Orders Placed This Encounter  Procedures   Ambulatory referral to Physical Medicine Rehab    Follow-up: Return for Left L5-S1 interlaminar epidural steroid injection.   Procedures: No procedures performed      Clinical History: MRI LUMBAR SPINE WITHOUT CONTRAST   TECHNIQUE: Multiplanar, multisequence MR imaging of the lumbar spine was performed. No intravenous contrast was administered.   COMPARISON:  Lumbar spine radiographs 01/27/2023   FINDINGS: Segmentation:  Standard.   Alignment:  3 mm anterolisthesis of L4 on L5.   Vertebrae: No fracture or suspicious marrow lesion. Mild degenerative facet edema at L4-5.  Conus medullaris and cauda equina: Conus extends to the L1 level. Conus and cauda equina appear normal.   Paraspinal and other soft tissues: Unremarkable.   Disc levels:   T12-L1: Minimal disc bulging, small left central disc protrusion, and mild right and moderate left facet and ligamentum  flavum hypertrophy without stenosis.   L1-2: Minimal disc bulging, a shallow left paracentral to left subarticular disc protrusion, and moderate facet and ligamentum flavum hypertrophy without stenosis.   L2-3: Mild disc bulging and severe facet and ligamentum flavum hypertrophy without stenosis.   L3-4: Minimal disc bulging and severe facet and ligamentum flavum hypertrophy without stenosis.   L4-5: Disc desiccation. Anterolisthesis with mild disc uncovering and severe facet and ligamentum flavum hypertrophy without stenosis.   L5-S1: Disc desiccation and moderate disc space narrowing. Disc bulging, endplate spurring, and severe facet and ligamentum flavum hypertrophy result in mild bilateral lateral recess stenosis and moderate to severe bilateral neural foraminal stenosis without significant spinal stenosis.   IMPRESSION: 1. Severe diffuse lumbar facet arthrosis. 2. Moderate to severe bilateral neural foraminal stenosis at L5-S1. 3. No spinal stenosis.     Electronically Signed   By: Sebastian Ache M.D.   On: 03/07/2023 08:47   She reports that she has never smoked. She has never used smokeless tobacco.  Recent Labs    01/12/23 0951  HGBA1C 6.1*    Objective:  VS:  HT:    WT:   BMI:     BP:   HR: bpm  TEMP: ( )  RESP:  Physical Exam Vitals and nursing note reviewed.  HENT:     Head: Normocephalic and atraumatic.     Right Ear: External ear normal.     Left Ear: External ear normal.     Nose: Nose normal.     Mouth/Throat:     Mouth: Mucous membranes are moist.  Eyes:     Extraocular Movements: Extraocular movements intact.  Cardiovascular:     Rate and Rhythm: Normal rate.     Pulses: Normal pulses.  Pulmonary:     Effort: Pulmonary effort is normal.  Abdominal:     General: Abdomen is flat. There is no distension.  Musculoskeletal:        General: Tenderness present.     Cervical back: Normal range of motion.     Comments: Patient is slow to rise  from seated position to standing. Good lumbar range of motion. No pain noted with facet loading. 5/5 strength noted with bilateral hip flexion, knee flexion/extension, ankle dorsiflexion/plantarflexion and EHL. No clonus noted bilaterally. No pain upon palpation of greater trochanters. No pain with internal/external rotation of bilateral hips. Sensation intact bilaterally. Dysesthesias noted to left L5 dermatome. Negative slump test bilaterally. Ambulates with cane, gait slow and unsteady.     Skin:    General: Skin is warm and dry.     Capillary Refill: Capillary refill takes less than 2 seconds.  Neurological:     Mental Status: She is alert and oriented to person, place, and time.     Gait: Gait abnormal.  Psychiatric:        Mood and Affect: Mood normal.        Behavior: Behavior normal.     Ortho Exam  Imaging: No results found.  Past Medical/Family/Surgical/Social History: Medications & Allergies reviewed per EMR, new medications updated. Patient Active Problem List   Diagnosis Date Noted   Burn 06/27/2023   Hemorrhoid 03/23/2023   RVF (right ventricular failure) (HCC)  01/12/2023   Atopic dermatitis 01/12/2023   Shortness of breath 08/29/2022   Encounter for screening mammogram for malignant neoplasm of breast 08/29/2022   Right groin pain 01/13/2022   Bilateral pulmonary embolism with right heart strain RV/LV ratio 1:5 (HCC) 12/31/2021   Acute respiratory failure with hypoxia (HCC) 12/31/2021   Total knee replacement status, left 12/09/21 12/09/2021   Unilateral primary osteoarthritis, left knee    Shoulder disorder 09/03/2021   Arthritis of knee 09/03/2021   Lumbar radiculopathy 09/03/2021   Abnormal MRI, knee 09/03/2021   Chronic pain of right knee 09/03/2021   Bronchitis 03/25/2021   Anemia 01/09/2020   HTN (hypertension) 12/31/2019   Chest pain 12/31/2019   DOE (dyspnea on exertion) 12/31/2019   Acute coronary syndrome (HCC) 12/31/2019   Unstable angina (HCC)  12/31/2019   Atherosclerosis 08/23/2018   Screening for tuberculosis 07/12/2018   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   Arthralgia 07/12/2018   Solitary pulmonary nodule 07/12/2018   Neck pain 02/15/2018   Dizziness 02/15/2018   Elevated TSH 02/15/2018   Screening for HIV (human immunodeficiency virus) 10/05/2017   Neuropathy 10/05/2017   Screening for colon cancer 10/05/2017   Hyperlipidemia 08/04/2016   Prediabetes 08/04/2016   Back pain 08/04/2016   Bilateral knee pain 06/18/2016   History of DVT of lower extremity 04/01/2014   History of DVT and PE (deep vein thrombosis) 05/04/2011   Past Medical History:  Diagnosis Date   Arthritis    DVT (deep venous thrombosis) (HCC) 2013   Dyspnea    due to knee pain   GERD (gastroesophageal reflux disease)    History of blood transfusion    History of hypertension    Hypertension    Peripheral vascular disease (HCC)    DVT   Pre-diabetes    Pulmonary embolism (HCC) 2013   Family History  Problem Relation Age of Onset   Hypertension Father    Heart disease Father 6   Thyroid disease Sister    Allergies Sister    CVA Neg Hx    Heart attack Neg Hx    High Cholesterol Neg Hx    Colon cancer Neg Hx    Past Surgical History:  Procedure Laterality Date   APPENDECTOMY     PULMONARY THROMBECTOMY Bilateral 01/01/2022   Procedure: PULMONARY THROMBECTOMY;  Surgeon: Jackquelyn Mass, MD;  Location: [facility] INVASIVE CV [facility];  Service: Cardiovascular;  Laterality: Bilateral;   TONSILLECTOMY AND ADENOIDECTOMY     TOTAL KNEE ARTHROPLASTY Left 12/09/2021   Procedure: LEFT TOTAL KNEE ARTHROPLASTY;  Surgeon: Timothy Ford, MD;  Location: [facility] OR;  Service: Orthopedics;  Laterality: Left;   Social History   Occupational History   Not on file  Tobacco Use   Smoking status: Never   Smokeless tobacco: Never  Vaping Use   Vaping status: Never Used  Substance and Sexual Activity   Alcohol use: Yes    Comment: Ocassional glass of wine   Drug use: No    Sexual activity: Not on file

## 2023-08-16 ENCOUNTER — Telehealth: Payer: Self-pay

## 2023-08-16 NOTE — Telephone Encounter (Signed)
 We received a request for discontinuation of medication prior to procedure via fax today.  I sent a copy to [employer], FNP's folder on the S drive.  I hand-delivered a copy to Jenate [place], CMA.

## 2023-08-23 ENCOUNTER — Telehealth: Payer: Self-pay

## 2023-08-23 NOTE — Telephone Encounter (Unsigned)
 Copied from CRM [id number]. Topic: General - Other >> Aug 23, 2023  3:53 PM Adonis Hoot wrote: Reason for CRM: Leah Roberts from [facility] called in stating they sent over a form to give permission for patient to stop eliquis  before epidural  steroid injection.She wasn't regarding her knee surgery. I relayed information to her from [employer] but it wasn't the correct information that they were needing.

## 2023-08-23 NOTE — Telephone Encounter (Signed)
 Call ortho   Confirm date of surgery  Then call pt and ensure she is aware of holding eliquis  and starting lovenox  bridge   Does she have the lovenox ??    Advise patient below and ask to repeat back to you to ensure understanding. You may also send instructions via mychart if she would like     Hold Eliquis  5mg  BID for 2 days prior to her knee replacement surgery   During time off Eliquis , she will take lovenox  injection two days prior to surgery. She will resume Eliquis  the day following surgery.      I had prescribed Lovenox  100 mg/ML every 12 hours for 2 days before surgery. Please ensure she has    No lovenox  on day of surgery.

## 2023-08-23 NOTE — Telephone Encounter (Signed)
 Spoke to pt and scheduled her mcvv for 08/24/23 to discuss below correction on medication regarding her surgery

## 2023-08-23 NOTE — Telephone Encounter (Signed)
 Leah Roberts is needing a call back regarding patient medication that needs to be discontinued

## 2023-08-23 NOTE — Telephone Encounter (Signed)
 LVM for Odilia Bennett [phone] @ [facility] to call back to inform her of medication instructions for pt surgery.. please relay message below or put call through to me   Confirm date of surgery   Then call pt and ensure she is aware of holding eliquis  and starting lovenox  bridge     Does she have the lovenox ??       Advise patient below and ask to repeat back to you to ensure understanding. You may also send instructions via mychart if she would like     Hold Eliquis  5mg  BID for 2 days prior to her knee replacement surgery    During time off Eliquis , she will take lovenox  injection two days prior to surgery. She will resume Eliquis  the day following surgery.      I had prescribed Lovenox  100 mg/ML every 12 hours for 2 days before surgery. Please ensure she has    No lovenox  on day of surgery.

## 2023-08-23 NOTE — Telephone Encounter (Signed)
 As dicussed, collaborated with Dr Adrian Alba, hematology, via secure chat today.   He is okay with Dr Ona Bidding recommendation to hold eliquis  for 3 days prior to left L5 S1 ESI.   She does NOT need lovenox  bridge.   Apologize for back and forth  Sch VV tomorrow if able and I can clarify  Please ask her to disregard mychart instructions today from earlier; that was prior to receiving fax from orthopedics.

## 2023-08-23 NOTE — Telephone Encounter (Signed)
 Spoke to [place] from [place] and gave verbal instructions per margaret/ Dr Simone Dubois Daisey Dryer as  follows    As dicussed, collaborated with Dr Adrian Alba, hematology, via secure chat today.    He is okay with Dr Ona Bidding recommendation to hold eliquis  for 3 days prior to left L5 S1 ESI.    She does NOT need lovenox  bridge.    Apologize for back and forth   Sch VV tomorrow if able and I can clarify   Please ask her to disregard mychart instructions today from earlier; that was prior to receiving fax from orthopedics.

## 2023-08-23 NOTE — Telephone Encounter (Signed)
 Copied from CRM [id number]. Topic: General - Other >> Aug 23, 2023  3:53 PM Adonis Hoot wrote: Reason for CRM: Leah Roberts from [facility] called in stating they sent over a form to give permission for patient to stop eliquis  before epidural  steroid injection.She wasn't regarding her knee surgery. I relayed information to her from [employer] but it wasn't the correct information that they were needing.

## 2023-08-24 ENCOUNTER — Encounter: Payer: Self-pay | Admitting: Family

## 2023-08-24 ENCOUNTER — Telehealth (INDEPENDENT_AMBULATORY_CARE_PROVIDER_SITE_OTHER): Payer: Self-pay | Admitting: Family

## 2023-08-24 ENCOUNTER — Other Ambulatory Visit: Payer: Self-pay

## 2023-08-24 VITALS — Ht 66.0 in | Wt 243.1 lb

## 2023-08-24 DIAGNOSIS — G8929 Other chronic pain: Secondary | ICD-10-CM

## 2023-08-24 DIAGNOSIS — I2699 Other pulmonary embolism without acute cor pulmonale: Secondary | ICD-10-CM

## 2023-08-24 DIAGNOSIS — M25561 Pain in right knee: Secondary | ICD-10-CM

## 2023-08-24 DIAGNOSIS — M5416 Radiculopathy, lumbar region: Secondary | ICD-10-CM

## 2023-08-24 MED ORDER — TRAMADOL HCL 50 MG PO TABS
50.0000 mg | ORAL_TABLET | Freq: Two times a day (BID) | ORAL | 2 refills | Status: DC | PRN
  Filled 2023-08-24: qty 60, 30d supply, fill #0
  Filled 2023-09-13 – 2023-09-27 (×2): qty 60, 30d supply, fill #1
  Filled 2023-11-01: qty 60, 30d supply, fill #2

## 2023-08-24 MED ORDER — ENOXAPARIN SODIUM 100 MG/ML IJ SOSY
PREFILLED_SYRINGE | INTRAMUSCULAR | 0 refills | Status: DC
  Filled 2023-08-24: qty 6, 3d supply, fill #0

## 2023-08-24 NOTE — Assessment & Plan Note (Addendum)
 Pending lumbar ESI with Dr Daisey Dryer.  Collaborated with hematology, Dr. Adrian Alba via secure chat.  Patient has longstanding history of multiple pulmonary embolism.  She is most comfortable with Lovenox  bridge. Advised hold eliquis  3 days prior to procedure and to bridge with lovenox  100mg /mL [place] skin every 12 hours for 3 days. Start 3 days before epidural steroid injection. No lovenox  injection on day of procedure.  Discussed with the patient over the phone.  She verbalized understanding Of note, she is no longer on gabapentin  as didn't find effective. We agreed to increase tramadol  50mg  to BID prn. Close follow up.

## 2023-08-24 NOTE — Progress Notes (Signed)
 Virtual Visit via Video Note  I connected with Leah Roberts on 08/24/23 at  1:00 PM EDT by a video enabled telemedicine application and verified that I am speaking with the correct person using two identifiers. Location patient: home Location provider: work  Persons participating in the virtual visit: patient, provider  I discussed the limitations of evaluation and management by telemedicine and the availability of in person appointments. The patient expressed understanding and agreed to proceed.  HPI:  Follow-up for anticoagulation ahead of  ESI with Dr Daisey Dryer  She is having right low back pain. She has numbness in right buttocks to right  Foot.   No saddle anesthesia , groin pain, leg swelling, increased heat, SOB, CP  She stopped gabapentin  as not effective. She would like to increase tramadol  50mg  every day. She has completed oxycodone  acetaminophen  ( quantity 20) .   Awaiting right total knee replacement.    She is compliant with eliquis  5mg  BID. She has not missed any doses.   H/o 4 PE's.    ROS: See pertinent positives and negatives per HPI.  EXAM:  VITALS per patient if applicable: Ht 5\' 6"  (1.676 m)   Wt 243 lb 1.6 oz (110.3 kg)   BMI 39.24 kg/m  BP Readings from Last 3 Encounters:  06/27/23 130/70  06/23/23 (!) 150/94  06/17/23 128/77   Wt Readings from Last 3 Encounters:  08/24/23 243 lb 1.6 oz (110.3 kg)  06/27/23 243 lb (110.2 kg)  06/17/23 239 lb (108.4 kg)    GENERAL: alert, oriented, appears well and in no acute distress  HEENT: atraumatic, conjunttiva clear, no obvious abnormalities on inspection of external nose and ears  NECK: normal movements of the head and neck  LUNGS: on inspection no signs of respiratory distress, breathing rate appears normal, no obvious gross SOB, gasping or wheezing  CV: no obvious cyanosis  MS: moves all visible extremities without noticeable abnormality  PSYCH/NEURO: pleasant and cooperative, no obvious  depression or anxiety, speech and thought processing grossly intact  ASSESSMENT AND PLAN: Lumbar radiculopathy Assessment & Plan: Pending lumbar ESI with Dr Daisey Dryer.  Collaborated with hematology, Dr. Adrian Alba via secure chat.  Patient has longstanding history of multiple pulmonary embolism.  She is most comfortable with Lovenox  bridge. Advised hold eliquis  3 days prior to procedure and to bridge with lovenox  100mg /mL [place] skin every 12 hours for 3 days. Start 3 days before epidural steroid injection. No lovenox  injection on day of procedure.  Discussed with the patient over the phone.  She verbalized understanding Of note, she is no longer on gabapentin  as didn't find effective. We agreed to increase tramadol  50mg  to BID prn. Close follow up.   Orders: -     Enoxaparin  Sodium; Inject 1 mL (100 mg total) into the skin every 12 (twelve) hours for 3 days. Start 3 days before epidural steroid injection. No injection on day of procedure.  Dispense: 6 mL; Refill: 0  Chronic pain of right knee -     traMADol  HCl; Take 1 tablet (50 mg total) by mouth every 12 (twelve) hours as needed.  Dispense: 60 tablet; Refill: 2  Bilateral pulmonary embolism with right heart strain RV/LV ratio 1:5 (HCC) -     Enoxaparin  Sodium; Inject 1 mL (100 mg total) into the skin every 12 (twelve) hours for 3 days. Start 3 days before epidural steroid injection. No injection on day of procedure.  Dispense: 6 mL; Refill: 0     -we discussed possible serious  and likely etiologies, options for evaluation and workup, limitations of telemedicine visit vs in person visit, treatment, treatment risks and precautions. Pt prefers to treat via telemedicine empirically rather then risking or undertaking an in person visit at this moment.    I discussed the assessment and treatment plan with the patient. The patient was provided an opportunity to ask questions and all were answered. The patient agreed with the plan and demonstrated an  understanding of the instructions.   The patient was advised to call back or seek an in-person evaluation if the symptoms worsen or if the condition fails to improve as anticipated.  Advised if desired AVS can be mailed or viewed via MyChart if Mychart user.   Bascom Bossier, FNP

## 2023-08-24 NOTE — Patient Instructions (Signed)
 Hold eliquis  3 days prior to procedure and to bridge with lovenox  100mg /mL [place] skin every 12 hours for 3 days during that time.   Start 3 days before epidural steroid injection.    No lovenox  injection on day of procedure.   Resume eliquis  the morning after your procedure as long as no bleeding/ complications from procedure.

## 2023-08-25 NOTE — Telephone Encounter (Signed)
 LVM for Leah Roberts @ [facility] to give me a call back to inform her of changes  in below message   Call [facility] Ensure they rec'ed fax yesterday Advise that she my hold eliquis  for 3 days per earlier discussion HOWEVER we have now decided to have lovenox  100mg /ml q12hr bridge . I have sent in for pt

## 2023-08-25 NOTE — Telephone Encounter (Signed)
 Call [facility] Ensure they rec'ed fax yesterday Advise that she my hold eliquis  for 3 days per earlier discussion HOWEVER we have now decided to have lovenox  100mg /ml q12hr bridge . I have sent in for pt.

## 2023-08-26 NOTE — Telephone Encounter (Signed)
 Confirmation has been given for correct medication before and after sugery

## 2023-08-30 ENCOUNTER — Other Ambulatory Visit: Payer: Self-pay

## 2023-08-31 ENCOUNTER — Other Ambulatory Visit: Payer: Self-pay

## 2023-09-07 ENCOUNTER — Ambulatory Visit: Payer: Self-pay | Admitting: Physical Medicine and Rehabilitation

## 2023-09-07 ENCOUNTER — Other Ambulatory Visit: Payer: Self-pay

## 2023-09-07 VITALS — BP 148/93 | HR 87

## 2023-09-07 DIAGNOSIS — M5416 Radiculopathy, lumbar region: Secondary | ICD-10-CM

## 2023-09-07 MED ORDER — METHYLPREDNISOLONE ACETATE 40 MG/ML IJ SUSP
40.0000 mg | Freq: Once | INTRAMUSCULAR | Status: AC
  Administered 2023-09-07: 40 mg

## 2023-09-07 NOTE — Patient Instructions (Signed)

## 2023-09-07 NOTE — Progress Notes (Signed)
 Pain Scale   Average Pain 7 Patient advising her pain is constant without relief, walking and sitting increases pain. Patient advised she stopped her Eliquis  on Sunday 09/04/23, patient instructed to restart Eliquis  on 09/08/23.        +Driver, -BT, -Dye Allergies.

## 2023-09-12 NOTE — Procedures (Signed)
 Lumbar Epidural Steroid Injection - Interlaminar Approach with Fluoroscopic Guidance  Patient: Leah Roberts      Date of Birth: [birth year] MRN: [id number] PCP: Calista Catching, FNP      Visit Date: 09/07/2023   Universal Protocol:     Consent Given By: the patient  Position: PRONE  Additional Comments: Vital signs were monitored before and after the procedure. Patient was prepped and draped in the usual sterile fashion. The correct patient, procedure, and site was verified.   Injection Procedure Details:   Procedure diagnoses: Lumbar radiculopathy [M54.16]   Meds Administered:  Meds ordered this encounter  Medications   methylPREDNISolone  acetate (DEPO-MEDROL ) injection 40 mg     Laterality: Left  Location/Site:  L5-S1  Needle: 4.5 in., 20 ga. Tuohy  Needle Placement: Paramedian epidural  Findings:   -Comments: Excellent flow of contrast into the epidural space.  Procedure Details: Using a paramedian approach from the side mentioned above, the region overlying the inferior lamina was localized under fluoroscopic visualization and the soft tissues overlying this structure were infiltrated with 4 ml. of 1% Lidocaine  without Epinephrine. The Tuohy needle was inserted into the epidural space using a paramedian approach.   The epidural space was localized using loss of resistance along with counter oblique bi-planar fluoroscopic views.  After negative aspirate for air, blood, and CSF, a 2 ml. volume of Isovue-250 was injected into the epidural space and the flow of contrast was observed. Radiographs were obtained for documentation purposes.    The injectate was administered into the level noted above.   Additional Comments:  The patient tolerated the procedure well Dressing: 2 x 2 sterile gauze and Band-Aid    Post-procedure details: Patient was observed during the procedure. Post-procedure instructions were reviewed.  Patient left the clinic in stable  condition.

## 2023-09-12 NOTE — Progress Notes (Signed)
 Leah Roberts - 73 y.o. female MRN [id number]  Date of birth: Apr 13, 1951  Office Visit Note: Visit Date: 09/07/2023 PCP: Calista Catching, FNP Referred by: Calista Catching, FNP  Subjective: Chief Complaint  Patient presents with   Lower Back - Pain   HPI:  Leah Roberts is a 73 y.o. female who comes in today at the request of Elvan Hamel, FNP for planned Left L5-S1 Lumbar Interlaminar epidural steroid injection with fluoroscopic guidance.  The patient has failed conservative care including home exercise, medications, time and activity modification.  This injection will be diagnostic and hopefully therapeutic.  Please see requesting physician notes for further details and justification.   ROS Otherwise per HPI.  Assessment & Plan: Visit Diagnoses:    ICD-10-CM   1. Lumbar radiculopathy  M54.16 XR C-ARM NO REPORT    Epidural Steroid injection    methylPREDNISolone  acetate (DEPO-MEDROL ) injection 40 mg      Plan: No additional findings.   Meds & Orders:  Meds ordered this encounter  Medications   methylPREDNISolone  acetate (DEPO-MEDROL ) injection 40 mg    Orders Placed This Encounter  Procedures   XR C-ARM NO REPORT   Epidural Steroid injection    Follow-up: Return if symptoms worsen or fail to improve.   Procedures: No procedures performed  Lumbar Epidural Steroid Injection - Interlaminar Approach with Fluoroscopic Guidance  Patient: Leah Roberts      Date of Birth: 06-06-1950 MRN: [id number] PCP: Calista Catching, FNP      Visit Date: 09/07/2023   Universal Protocol:     Consent Given By: the patient  Position: PRONE  Additional Comments: Vital signs were monitored before and after the procedure. Patient was prepped and draped in the usual sterile fashion. The correct patient, procedure, and site was verified.   Injection Procedure Details:   Procedure diagnoses: Lumbar radiculopathy [M54.16]   Meds Administered:  Meds  ordered this encounter  Medications   methylPREDNISolone  acetate (DEPO-MEDROL ) injection 40 mg     Laterality: Left  Location/Site:  L5-S1  Needle: 4.5 in., 20 ga. Tuohy  Needle Placement: Paramedian epidural  Findings:   -Comments: Excellent flow of contrast into the epidural space.  Procedure Details: Using a paramedian approach from the side mentioned above, the region overlying the inferior lamina was localized under fluoroscopic visualization and the soft tissues overlying this structure were infiltrated with 4 ml. of 1% Lidocaine  without Epinephrine. The Tuohy needle was inserted into the epidural space using a paramedian approach.   The epidural space was localized using loss of resistance along with counter oblique bi-planar fluoroscopic views.  After negative aspirate for air, blood, and CSF, a 2 ml. volume of Isovue-250 was injected into the epidural space and the flow of contrast was observed. Radiographs were obtained for documentation purposes.    The injectate was administered into the level noted above.   Additional Comments:  The patient tolerated the procedure well Dressing: 2 x 2 sterile gauze and Band-Aid    Post-procedure details: Patient was observed during the procedure. Post-procedure instructions were reviewed.  Patient left the clinic in stable condition.   Clinical History: MRI LUMBAR SPINE WITHOUT CONTRAST   TECHNIQUE: Multiplanar, multisequence MR imaging of the lumbar spine was performed. No intravenous contrast was administered.   COMPARISON:  Lumbar spine radiographs 01/27/2023   FINDINGS: Segmentation:  Standard.   Alignment:  3 mm anterolisthesis of L4 on L5.   Vertebrae: No fracture or suspicious marrow lesion. Mild degenerative facet edema  at L4-5.   Conus medullaris and cauda equina: Conus extends to the L1 level. Conus and cauda equina appear normal.   Paraspinal and other soft tissues: Unremarkable.   Disc levels:    T12-L1: Minimal disc bulging, small left central disc protrusion, and mild right and moderate left facet and ligamentum flavum hypertrophy without stenosis.   L1-2: Minimal disc bulging, a shallow left paracentral to left subarticular disc protrusion, and moderate facet and ligamentum flavum hypertrophy without stenosis.   L2-3: Mild disc bulging and severe facet and ligamentum flavum hypertrophy without stenosis.   L3-4: Minimal disc bulging and severe facet and ligamentum flavum hypertrophy without stenosis.   L4-5: Disc desiccation. Anterolisthesis with mild disc uncovering and severe facet and ligamentum flavum hypertrophy without stenosis.   L5-S1: Disc desiccation and moderate disc space narrowing. Disc bulging, endplate spurring, and severe facet and ligamentum flavum hypertrophy result in mild bilateral lateral recess stenosis and moderate to severe bilateral neural foraminal stenosis without significant spinal stenosis.   IMPRESSION: 1. Severe diffuse lumbar facet arthrosis. 2. Moderate to severe bilateral neural foraminal stenosis at L5-S1. 3. No spinal stenosis.     Electronically Signed   By: Aundra Lee M.D.   On: 03/07/2023 08:47     Objective:  VS:  HT:    WT:   BMI:     BP:(!) 148/93  HR:87bpm  TEMP: ( )  RESP:  Physical Exam Vitals and nursing note reviewed.  Constitutional:      General: She is not in acute distress.    Appearance: Normal appearance. She is not ill-appearing.  HENT:     Head: Normocephalic and atraumatic.     Right Ear: External ear normal.     Left Ear: External ear normal.  Eyes:     Extraocular Movements: Extraocular movements intact.  Cardiovascular:     Rate and Rhythm: Normal rate.     Pulses: Normal pulses.  Pulmonary:     Effort: Pulmonary effort is normal. No respiratory distress.  Abdominal:     General: There is no distension.     Palpations: Abdomen is soft.  Musculoskeletal:        General: Tenderness  present.     Cervical back: Neck supple.     Right lower leg: No edema.     Left lower leg: No edema.     Comments: Patient has good distal strength with no pain over the greater trochanters.  No clonus or focal weakness.  Skin:    Findings: No erythema, lesion or rash.  Neurological:     General: No focal deficit present.     Mental Status: She is alert and oriented to person, place, and time.     Sensory: No sensory deficit.     Motor: No weakness or abnormal muscle tone.     Coordination: Coordination normal.  Psychiatric:        Mood and Affect: Mood normal.        Behavior: Behavior normal.      Imaging: No results found.

## 2023-09-13 MED FILL — Apixaban Tab 5 MG: ORAL | 90 days supply | Qty: 180 | Fill #1 | Status: CN

## 2023-09-13 MED FILL — Rosuvastatin Calcium Tab 5 MG: ORAL | 90 days supply | Qty: 90 | Fill #2 | Status: CN

## 2023-09-14 ENCOUNTER — Telehealth: Payer: Self-pay | Admitting: Orthopedic Surgery

## 2023-09-14 ENCOUNTER — Other Ambulatory Visit: Payer: Self-pay

## 2023-09-14 MED FILL — Apixaban Tab 5 MG: ORAL | 30 days supply | Qty: 60 | Fill #1 | Status: AC

## 2023-09-14 NOTE — Telephone Encounter (Signed)
 Pt called ready to reschedule surgery. Please call pt about this matter at [phone].

## 2023-09-15 ENCOUNTER — Other Ambulatory Visit: Payer: Self-pay

## 2023-09-15 MED FILL — Rosuvastatin Calcium Tab 5 MG: ORAL | 90 days supply | Qty: 90 | Fill #2 | Status: CN

## 2023-09-15 NOTE — Telephone Encounter (Signed)
Can you take a look at this? 

## 2023-09-22 ENCOUNTER — Other Ambulatory Visit (INDEPENDENT_AMBULATORY_CARE_PROVIDER_SITE_OTHER): Payer: Self-pay

## 2023-09-22 DIAGNOSIS — E119 Type 2 diabetes mellitus without complications: Secondary | ICD-10-CM

## 2023-09-22 LAB — HEMOGLOBIN A1C: Hgb A1c MFr Bld: 6.5 % (ref 4.6–6.5)

## 2023-09-22 LAB — LIPID PANEL
Cholesterol: 149 mg/dL (ref 0–200)
HDL: 50.3 mg/dL (ref >39.00)
LDL Cholesterol: 82 mg/dL (ref 0–99)
NonHDL: 98.21
Total CHOL/HDL Ratio: 3
Triglycerides: 81 mg/dL (ref 0.0–149.0)
VLDL: 16.2 mg/dL (ref 0.0–40.0)

## 2023-09-23 ENCOUNTER — Ambulatory Visit: Payer: Self-pay | Admitting: Family

## 2023-09-27 ENCOUNTER — Other Ambulatory Visit: Payer: Self-pay

## 2023-09-27 MED FILL — Rosuvastatin Calcium Tab 5 MG: ORAL | 90 days supply | Qty: 90 | Fill #2 | Status: AC

## 2023-09-28 ENCOUNTER — Telehealth: Payer: Self-pay | Admitting: Pharmacy Technician

## 2023-09-28 ENCOUNTER — Encounter: Payer: Self-pay | Admitting: Pharmacist

## 2023-09-28 NOTE — Telephone Encounter (Signed)
 Oral Oncology Patient Advocate Encounter  Patient's PAP (patient assistance program) supplied medication was delivered to [facility] on 09/27/2023.  Patient was able to pick up medication today, 09/28/2023.  I advised the patient's prescriber for the medication that she/whoever assisted the patient in enrolling into PAP need to update the delivery address.  Moving forward if the medication gets wrongfully delivered to [facility] then we will have to return to sender as the patient is not a patient of the Cancer Center and they are not getting medication prescribed by a provider here.  Leah Roberts (Patty) Benjaman Branch, CPhT  [facility] [place], [place]ine Done, [place] Oral Chemotherapy Patient Advocate Phone: [phone]  Fax: [phone]

## 2023-09-28 NOTE — Progress Notes (Signed)
 Brief Telephone Documentation Reason for Call: [facility] states patient's Eliquis  prescription was received at their address. Patient has received medication though address needs to be changed to PCP office.   Summary of Call: Called [employer] and confirmed correct provider address for future deliveries.  [employer] representative confirms that this information has been updated on the patient's profile.  All future Eliquis  deliveries should now ship to [facility].   Follow Up: Patient messaged via MyChart   Daron Ellen, PharmD Clinical Pharmacist [facility] [phone]

## 2023-10-05 ENCOUNTER — Encounter: Payer: Self-pay | Admitting: Family

## 2023-10-05 ENCOUNTER — Other Ambulatory Visit: Payer: Self-pay

## 2023-10-05 ENCOUNTER — Ambulatory Visit (INDEPENDENT_AMBULATORY_CARE_PROVIDER_SITE_OTHER): Payer: Self-pay | Admitting: Family

## 2023-10-05 DIAGNOSIS — M1711 Unilateral primary osteoarthritis, right knee: Secondary | ICD-10-CM

## 2023-10-05 NOTE — Telephone Encounter (Signed)
 Leah Roberts is thinking that she would like to have this done around the last week of June.  She is going to check with family and call me back to schedule.

## 2023-10-05 NOTE — Progress Notes (Signed)
 The patient is a 73 year old woman seen today for surgical scheduling.  She is planning for total knee arthroplasty on the right this has previously been scheduled she previously did have a burn on her thigh which is now well-healed there is no open area of her skin no erythema no sign of infection she would like to proceed with total knee arthroplasty on the right.  We will set this up at her convenience.  She prefers to call [facility] with exact date of her surgery is planning for the end of this month.

## 2023-10-11 ENCOUNTER — Other Ambulatory Visit: Payer: Self-pay

## 2023-10-26 ENCOUNTER — Other Ambulatory Visit (HOSPITAL_COMMUNITY): Payer: Self-pay

## 2023-11-01 ENCOUNTER — Other Ambulatory Visit: Payer: Self-pay

## 2023-11-03 ENCOUNTER — Other Ambulatory Visit: Payer: Self-pay

## 2023-11-23 ENCOUNTER — Ambulatory Visit: Admitting: Internal Medicine

## 2023-11-23 ENCOUNTER — Other Ambulatory Visit: Payer: Self-pay

## 2023-11-23 ENCOUNTER — Other Ambulatory Visit: Payer: Self-pay | Admitting: *Deleted

## 2023-11-23 ENCOUNTER — Ambulatory Visit (INDEPENDENT_AMBULATORY_CARE_PROVIDER_SITE_OTHER)

## 2023-11-23 ENCOUNTER — Ambulatory Visit: Admitting: Nurse Practitioner

## 2023-11-23 VITALS — BP 114/86 | HR 83 | Temp 98.4°F | Ht 66.0 in | Wt 232.0 lb

## 2023-11-23 DIAGNOSIS — Z7901 Long term (current) use of anticoagulants: Secondary | ICD-10-CM | POA: Insufficient documentation

## 2023-11-23 DIAGNOSIS — Z86718 Personal history of other venous thrombosis and embolism: Secondary | ICD-10-CM

## 2023-11-23 MED ORDER — ENOXAPARIN SODIUM 100 MG/ML IJ SOSY
PREFILLED_SYRINGE | INTRAMUSCULAR | 0 refills | Status: DC
  Filled 2023-11-23 – 2023-11-24 (×2): qty 4, 2d supply, fill #0

## 2023-11-23 NOTE — Assessment & Plan Note (Signed)
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 Requires Lovenox  bridging due to upcoming right knee surgery on 11/30/2023. Reviewed previous recommendations from PCP and heme/onn Dr. Jacobo. Emphasized adherence to prevent serious complications. She agreed to follow the regimen. - Hold Eliquis  two days prior to surgery (11/28/23). Start Lovenox  100 mg every 12 hours two days prior to surgery start date. Restart Eliquis  5mg  BID on December 01, 2023, the day after surgery. Send prescription to [facility].

## 2023-11-23 NOTE — Progress Notes (Signed)
 Acute Office Visit  Subjective:    Patient ID: Leah Roberts, female    DOB: 06-18-1950, 73 y.o.   MRN: [id number]  Chief Complaint  Patient presents with   Knee Pain    On Eliquis  for recurrent DVT, PE. Has right knee replacement surgery on 7/30. Presenting to the clinic requesting refill on Lovenox  pre surgery.     HPI Discussed the use of AI scribe software for clinical note transcription with the patient, who gave verbal consent to proceed.  History of Present Illness Patient is a 73 y.o. 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  She is scheduled for a right total knee replacement on November 30, 2023, at [facility] in [place]. She reports of right knee pain and is looking forward to getting surgery.   Last B/L PE post left knee replacement on 12/30/2021. Patient reports she was not compliant with taking Eliquis  post procedure when this episode happened. Prior PE per patient in 2014, DVT (2013, 2014, 2023). She is currently on Eliquis  5 mg twice a day.   She has seen Dr. Jacobo oncology in the past. Patient was previously scheduled for R knee replacement in 06/2023 which was rescheduled due to concern for skin infection over right knee. At that time her PCP had consulted Dr. Jacobo and was recommended the following regime:  Hold Eliquis  2 days prior to surgery. Start Lovenox  100mg /ml, twice a day  2 days prior to surgery. Restart Eliquis  the day following surgery.   No h/o mechanical valve, a fib.    ROS As per HPI    Objective:    BP 114/86 (BP Location: Right Arm, Patient Position: Sitting, Cuff Size: Normal)   Pulse 83   Temp 98.4 F (36.9 C) (Oral)   Ht 5' 6 (1.676 m)   Wt 232 lb (105.2 kg)   SpO2 96%   BMI 37.45 kg/m     Physical Exam HENT:     Head: Normocephalic and atraumatic.  Cardiovascular:     Rate and Rhythm: Normal rate.   Pulmonary:     Effort: Pulmonary effort is normal.     Breath sounds: Normal breath sounds. No wheezing.  Musculoskeletal:     Cervical back: Normal range of motion.     Right knee: Effusion present. Decreased range of motion. Tenderness present.     Left knee: Normal range of motion.     Right lower leg: No edema.     Left lower leg: No edema.  Neurological:     Mental Status: She is alert and oriented to person, place, and time.  Psychiatric:        Mood and Affect: Mood normal.     No results found for any visits on 11/23/23.     Assessment & Plan:   Anticoagulated Assessment & Plan: - Lifelong anticoagulation with Eliquis  5 mg BID due to recurrent DVT and PE. Requires Lovenox  bridging due to upcoming right knee surgery on 11/30/2023. Reviewed previous recommendations from PCP and heme/onn Dr. Jacobo. Emphasized adherence to prevent serious complications. She agreed to follow the regimen. - Hold Eliquis  two days prior to surgery (11/28/23). Start Lovenox  100 mg every 12 hours two days prior to surgery start date. Restart Eliquis  5mg  BID on December 01, 2023, the day after surgery. Send prescription to [facility].    Orders: -     Enoxaparin  Sodium; Inject 1 mL (  100 mg total) into the skin every 12 (twelve) hours for 2 days. Start 2 days before total knee replacement scheduled for 11/30/23.  Dispense: 4 mL; Refill: 0  History of DVT and PE (deep vein thrombosis) Assessment & Plan: - Lifelong anticoagulation with Eliquis  5 mg BID due to recurrent DVT and PE. Requires Lovenox  bridging due to upcoming right knee surgery on 11/30/2023. Reviewed previous recommendations from PCP and heme/onn Dr. Jacobo. Emphasized adherence to prevent serious complications. She agreed to follow the regimen. - Hold Eliquis  two days prior to surgery (11/28/23). Start Lovenox  100 mg every 12 hours two days prior to surgery start date. Restart Eliquis  5mg  BID on December 01, 2023, the day after surgery.  Send prescription to [facility].    Orders: -     Enoxaparin  Sodium; Inject 1 mL (100 mg total) into the skin every 12 (twelve) hours for 2 days. Start 2 days before total knee replacement scheduled for 11/30/23.  Dispense: 4 mL; Refill: 0    No follow-ups on file.  Luke Shade, MD

## 2023-11-23 NOTE — Patient Instructions (Addendum)
 Hold Eliquis  5 mg twice a day, 2 days before right knee replacement (that is 2 days before 11/30/23). Start Lovenox  100 mg/ml every 12 hours for 2 days before the surgery that is on 11/28/23. Please restart Eliquis  5 mg twice a day on 12/01/2023 that is next day from surgery. No Lovenox  on the day of surgery.

## 2023-11-23 NOTE — Progress Notes (Signed)
 Surgical Instructions   Your procedure is scheduled on Wednesday November 30, 2023. Report to [facility] Main Entrance A at 5:30 A.M., then check in with the Admitting office. Any questions or running late day of surgery: call [phone]  Questions prior to your surgery date: call [phone], Monday-Friday, 8am-4pm. If you experience any cold or flu symptoms such as cough, fever, chills, shortness of breath, etc. between now and your scheduled surgery, please notify us  at the above number.     Remember:  Do not eat after midnight the night before your surgery  You may drink clear liquids until 4:30 the morning of your surgery.   Clear liquids allowed are: Water, Non-Citrus Juices (without pulp), Carbonated Beverages, Clear Tea (no milk, honey, etc.), Black Coffee Only (NO MILK, CREAM OR POWDERED CREAMER of any kind), and Gatorade.    Take these medicines the morning of surgery with A SIP OF WATER  doxycycline  (VIBRAMYCIN )  loratadine (CLARITIN)   May take these medicines IF NEEDED: albuterol  (VENTOLIN  HFA) 108 (90 Base) MCG/ACT inhaler Please bring with you to the hospital traMADol  (ULTRAM )   Follow your surgeon's instructions on when to stop apixaban  (ELIQUIS ) .  If no instructions were given by your surgeon then you will need to call the office to get those instructions.    One week prior to surgery, STOP taking any Aspirin  (unless otherwise instructed by your surgeon) Aleve, Naproxen, Ibuprofen, Motrin, Advil, Goody's, BC's, all herbal medications, fish oil, and non-prescription vitamins.                     Do NOT Smoke (Tobacco/Vaping) for 24 hours prior to your procedure.  If you use a CPAP at night, you may bring your mask/headgear for your overnight stay.   You will be asked to remove any contacts, glasses, piercing's, hearing aid's, dentures/partials prior to surgery. Please bring cases for these items if needed.    Patients discharged the day of surgery will not be  allowed to drive home, and someone needs to stay with them for 24 hours.  SURGICAL WAITING ROOM VISITATION Patients may have no more than 2 support people in the waiting area - these visitors may rotate.   Pre-op nurse will coordinate an appropriate time for 1 ADULT support person, who may not rotate, to accompany patient in pre-op.  Children under the age of 14 must have an adult with them who is not the patient and must remain in the main waiting area with an adult.  If the patient needs to stay at the hospital during part of their recovery, the visitor guidelines for inpatient rooms apply.  Please refer to the [facility] website for the visitor guidelines for any additional information.   If you received a COVID test during your pre-op visit  it is requested that you wear a mask when out in public, stay away from anyone that may not be feeling well and notify your surgeon if you develop symptoms. If you have been in contact with anyone that has tested positive in the last 10 days please notify you surgeon.      Pre-operative 5 CHG Bathing Instructions   You can play a key role in reducing the risk of infection after surgery. Your skin needs to be as free of germs as possible. You can reduce the number of germs on your skin by washing with CHG (chlorhexidine  gluconate) soap before surgery. CHG is an antiseptic soap that kills germs and continues  to kill germs even after washing.   DO NOT use if you have an allergy to chlorhexidine /CHG or antibacterial soaps. If your skin becomes reddened or irritated, stop using the CHG and notify one of our RNs at [phone].   Please shower with the CHG soap starting 4 days before surgery using the following schedule:     Please keep in mind the following:  DO NOT shave, including legs and underarms, starting the day of your first shower.   Place clean sheets on your bed the day you start using CHG soap. Use a clean washcloth (not used since  being washed) for each shower. DO NOT sleep with pets once you start using the CHG.   CHG Shower Instructions:  Wash your face and private area with normal soap. If you choose to wash your hair, wash first with your normal shampoo.  After you use shampoo/soap, rinse your hair and body thoroughly to remove shampoo/soap residue.  Turn the water OFF and apply about 3 tablespoons (45 ml) of CHG soap to a CLEAN washcloth.  Apply CHG soap ONLY FROM YOUR NECK DOWN TO YOUR TOES (washing for 3-5 minutes)  DO NOT use CHG soap on face, private areas, open wounds, or sores.  Pay special attention to the area where your surgery is being performed.  If you are having back surgery, having someone wash your back for you may be helpful. Wait 2 minutes after CHG soap is applied, then you may rinse off the CHG soap.  Pat dry with a clean towel  Put on clean clothes/pajamas   If you choose to wear lotion, please use ONLY the CHG-compatible lotions that are listed below.  Additional instructions for the day of surgery: DO NOT APPLY any lotions, deodorants or perfumes.   Do not bring valuables to the hospital. [facility] is not responsible for any belongings/valuables. Do not wear nail polish, gel polish, artificial nails, or any other type of covering on natural nails (fingers and toes) Do not wear jewelry or makeup Put on clean/comfortable clothes.  Please brush your teeth.  Ask your nurse before applying any prescription medications to the skin.     CHG Compatible Lotions   Aveeno Moisturizing lotion  Cetaphil Moisturizing Cream  Cetaphil Moisturizing Lotion  Clairol Herbal Essence Moisturizing Lotion, Dry Skin  Clairol Herbal Essence Moisturizing Lotion, Extra Dry Skin  Clairol Herbal Essence Moisturizing Lotion, Normal Skin  Curel Age Defying Therapeutic Moisturizing Lotion with Alpha Hydroxy  Curel Extreme Care Body Lotion  Curel Soothing Hands Moisturizing Hand Lotion  Curel Therapeutic  Moisturizing Cream, Fragrance-Free  Curel Therapeutic Moisturizing Lotion, Fragrance-Free  Curel Therapeutic Moisturizing Lotion, Original Formula  Eucerin Daily Replenishing Lotion  Eucerin Dry Skin Therapy Plus Alpha Hydroxy Crme  Eucerin Dry Skin Therapy Plus Alpha Hydroxy Lotion  Eucerin Original Crme  Eucerin Original Lotion  Eucerin Plus Crme Eucerin Plus Lotion  Eucerin TriLipid Replenishing Lotion  Keri Anti-Bacterial Hand Lotion  Keri Deep Conditioning Original Lotion Dry Skin Formula Softly Scented  Keri Deep Conditioning Original Lotion, Fragrance Free Sensitive Skin Formula  Keri Lotion Fast Absorbing Fragrance Free Sensitive Skin Formula  Keri Lotion Fast Absorbing Softly Scented Dry Skin Formula  Keri Original Lotion  Keri Skin Renewal Lotion Keri Silky Smooth Lotion  Keri Silky Smooth Sensitive Skin Lotion  Nivea Body Creamy Conditioning Oil  Nivea Body Extra Enriched Teacher, adult education Moisturizing Lotion Nivea Crme  Nivea Skin Firming Lotion  NutraDerm  30 Skin Lotion  NutraDerm Skin Lotion  NutraDerm Therapeutic Skin Cream  NutraDerm Therapeutic Skin Lotion  ProShield Protective Hand Cream  Provon moisturizing lotion  Please read over the following fact sheets that you were given.

## 2023-11-24 ENCOUNTER — Encounter (HOSPITAL_COMMUNITY)
Admission: RE | Admit: 2023-11-24 | Discharge: 2023-11-24 | Disposition: A | Discharge status: Home / Self Care | Payer: Self-pay | Source: Ambulatory Visit | Attending: Orthopedic Surgery | Admitting: Orthopedic Surgery

## 2023-11-24 ENCOUNTER — Other Ambulatory Visit: Payer: Self-pay

## 2023-11-24 ENCOUNTER — Encounter (HOSPITAL_COMMUNITY): Payer: Self-pay

## 2023-11-24 VITALS — BP 130/83 | HR 79 | Temp 98.5°F | Resp 17 | Ht 65.0 in | Wt 233.0 lb

## 2023-11-24 DIAGNOSIS — Z86711 Personal history of pulmonary embolism: Secondary | ICD-10-CM | POA: Insufficient documentation

## 2023-11-24 DIAGNOSIS — Z01818 Encounter for other preprocedural examination: Secondary | ICD-10-CM | POA: Insufficient documentation

## 2023-11-24 DIAGNOSIS — Z7901 Long term (current) use of anticoagulants: Secondary | ICD-10-CM | POA: Insufficient documentation

## 2023-11-24 DIAGNOSIS — Z86718 Personal history of other venous thrombosis and embolism: Secondary | ICD-10-CM | POA: Insufficient documentation

## 2023-11-24 DIAGNOSIS — R0602 Shortness of breath: Secondary | ICD-10-CM | POA: Insufficient documentation

## 2023-11-24 DIAGNOSIS — I10 Essential (primary) hypertension: Secondary | ICD-10-CM | POA: Insufficient documentation

## 2023-11-24 LAB — BASIC METABOLIC PANEL WITH GFR
Anion gap: 9 (ref 5–15)
BUN: 12 mg/dL (ref 8–23)
CO2: 22 mmol/L (ref 22–32)
Calcium: 9.2 mg/dL (ref 8.9–10.3)
Chloride: 110 mmol/L (ref 98–111)
Creatinine, Ser: 1.02 mg/dL — ABNORMAL HIGH (ref 0.44–1.00)
GFR, Estimated: 59 mL/min — ABNORMAL LOW (ref >60)
Glucose, Bld: 86 mg/dL (ref 70–99)
Potassium: 3.6 mmol/L (ref 3.5–5.1)
Sodium: 141 mmol/L (ref 135–145)

## 2023-11-24 LAB — CBC
HCT: 39.8 % (ref 36.0–46.0)
Hemoglobin: 12.5 g/dL (ref 12.0–15.0)
MCH: 27.2 pg (ref 26.0–34.0)
MCHC: 31.4 g/dL (ref 30.0–36.0)
MCV: 86.7 fL (ref 80.0–100.0)
Platelets: 236 K/uL (ref 150–400)
RBC: 4.59 MIL/uL (ref 3.87–5.11)
RDW: 14.3 % (ref 11.5–15.5)
WBC: 6.7 K/uL (ref 4.0–10.5)
nRBC: 0 % (ref 0.0–0.2)

## 2023-11-24 LAB — SURGICAL PCR SCREEN
MRSA, PCR: NEGATIVE
Staphylococcus aureus: NEGATIVE

## 2023-11-24 NOTE — Progress Notes (Signed)
 PCP - Sidney Northern, FNP Cardiologist - denies  PPM/ICD - denies   Chest x-ray - 09/01/22 EKG - 11/24/23 Stress Test - 2021 ECHO - 09/10/22 Cardiac Cath - denies  Sleep Study - denies   DM- denies  Last dose of GLP1 agonist-  n/a   Blood Thinner Instructions: Hold Eliquis  2 days prior to surgery. Start lovenox  2 days prior (7/28 and 7/29). Do not take Lovenox  on morning of surgery Aspirin  Instructions: n/a  ERAS Protcol - clears until 0430   COVID TEST- n/a   Anesthesia review: yes, Lynwood wrote note in February. Pt rescheduled r/t cardiac possible input needed  Patient denies shortness of breath, fever, cough and chest pain at PAT appointment   All instructions explained to the patient, with a verbal understanding of the material. Patient agrees to go over the instructions while at home for a better understanding. The opportunity to ask questions was provided.

## 2023-11-25 NOTE — Anesthesia Preprocedure Evaluation (Addendum)
 Anesthesia Evaluation  Patient identified by MRN, date of birth, ID band Patient awake    Reviewed: Allergy & Precautions, NPO status , Patient's Chart, lab work & pertinent test results  History of Anesthesia Complications (+) PROLONGED EMERGENCE and history of anesthetic complications  Airway Mallampati: II  TM Distance: >3 FB Neck ROM: Full    Dental no notable dental hx. (+) Teeth Intact   Pulmonary neg sleep apnea, neg COPD, Patient abstained from smoking.Not current smoker, PE   Pulmonary exam normal breath sounds clear to auscultation       Cardiovascular Exercise Tolerance: Good METShypertension, + Peripheral Vascular Disease and + DVT  (-) CAD and (-) Past MI (-) dysrhythmias  Rhythm:Regular Rate:Normal - Systolic murmurs    Neuro/Psych negative neurological ROS  negative psych ROS   GI/Hepatic ,neg GERD  ,,(+)     (-) substance abuse    Endo/Other  neg diabetes    Renal/GU negative Renal ROS     Musculoskeletal   Abdominal   Peds  Hematology  (+) Blood dyscrasia, anemia   Anesthesia Other Findings 73 year old female follows with cardiology for history of recurrent DVT/PE on Eliquis .  She has had a negative hypercoagulable workup in the past. She was seen at [facility] in late 2023 for shortness of breath after missing a few doses of Eliquis . Chest CT at that time demonstrated bilateral PEs with RV strain. She underwent thrombectomy by vascular surgery with follow-up echocardiogram demonstrating preserved RV and LV function.  She was evaluated by cardiologist Dr. Gardenia 09/22/2022 for DOE and RV dysfunction. Per note, Patient has a history of DVTs & PE in 2014 and 2023 with RV strain on CT (now s/p thrombectomy); personally reviewed CT from time of her PE. LV:RV, contrast reflux into IVC and dilation of hepatic veins is all consistent with RV dysfunction. - V/Q scan on 09/01/22 w/ abnormal perfusion in both lungs;  scattered subsegmental perfusion defects. - Due to persistent SOB and mild RV dysfunction will plan on RHC to rule out CTEPH.  Patient ultimately did not undergo right heart cath.   Her PCP [name], FNP did reach out to cardiology in telephone encounter 06/24/2023 for recommendations regarding surgery.  Per response from Dr. Gardenia, Based on her prior echocardiogram she has normal LV function and near normal RV function. There is no indication for cath. We can repeat an echo to reassess if there is any concern.   Patient is on Lovenox  bridge per PCP.  Reports last dose Eliquis  will be on 11/27/2023.  She will take Lovenox  on 7/28 and 7/29.   Reproductive/Obstetrics                              Anesthesia Physical Anesthesia Plan  ASA: 3  Anesthesia Plan: General   Post-op Pain Management: Tylenol  PO (pre-op)* and Regional block*   Induction: Intravenous  PONV Risk Score and Plan: 3 and Ondansetron , Dexamethasone  and Treatment may vary due to age or medical condition  Airway Management Planned: Oral ETT  Additional Equipment: None  Intra-op Plan:   Post-operative Plan: Extubation in OR  Informed Consent: I have reviewed the patients History and Physical, chart, labs and discussed the procedure including the risks, benefits and alternatives for the proposed anesthesia with the patient or authorized representative who has indicated his/her understanding and acceptance.     Dental advisory given  Plan Discussed with: CRNA and Surgeon  Anesthesia Plan Comments: (  Patient took eliquis  Sunday night, not at the >72hr mark, so we will do GETA. Discussed risks of anesthesia with patient, including PONV, sore throat, lip/dental/eye damage. Rare risks discussed as well, such as cardiorespiratory and neurological sequelae, and allergic reactions. Discussed the role of CRNA in patient's perioperative care. Patient understands. Discussed r/b/a of adductor  canal nerve block, including:  - bleeding, infection, nerve damage - poor or non functioning block. - reactions and toxicity to local anesthetic Patient understands. )         Anesthesia Quick Evaluation

## 2023-11-25 NOTE — Progress Notes (Signed)
 Anesthesia Chart Review:  73 year old female follows with cardiology for history of recurrent DVT/PE on Eliquis .  She has had a negative hypercoagulable workup in the past. She was seen at [facility] in late 2023 for shortness of breath after missing a few doses of Eliquis . Chest CT at that time demonstrated bilateral PEs with RV strain. She underwent thrombectomy by vascular surgery with follow-up echocardiogram demonstrating preserved RV and LV function.  She was evaluated by cardiologist Dr. Gardenia 09/22/2022 for DOE and RV dysfunction. Per note, Patient has a history of DVTs & PE in 2014 and 2023 with RV strain on CT (now s/p thrombectomy); personally reviewed CT from time of her PE. LV:RV, contrast reflux into IVC and dilation of hepatic veins is all consistent with RV dysfunction. - V/Q scan on 09/01/22 w/ abnormal perfusion in both lungs; scattered subsegmental perfusion defects. - Due to persistent SOB and mild RV dysfunction will plan on RHC to rule out CTEPH.  Patient ultimately did not undergo right heart cath.  Her PCP [name], FNP did reach out to cardiology in telephone encounter 06/24/2023 for recommendations regarding surgery.  Per response from Dr. Gardenia, Based on her prior echocardiogram she has normal LV function and near normal RV function. There is no indication for cath. We can repeat an echo to reassess if there is any concern.  Patient is on Lovenox  bridge per PCP.  Reports last dose Eliquis  will be on 11/27/2023.  She will take Lovenox  on 7/28 and 7/29.  Preop labs reviewed, unremarkable.  EKG 11/24/2023: NSR.  Rate 81.  TTE 09/10/22: 1. Left ventricular ejection fraction, by estimation, is 60 to 65%. The  left ventricle has normal function. The left ventricle has no regional  wall motion abnormalities. Left ventricular diastolic parameters are  consistent with Grade I diastolic  dysfunction (impaired relaxation).   2. Right ventricular systolic function is mildly  reduced, though not well  visualized. The right ventricular size is grossly normal.   3. Moderate pleural effusion in the left lateral region.   4. The mitral valve is normal in structure. No evidence of mitral valve  regurgitation. No evidence of mitral stenosis.   5. The aortic valve is tricuspid. Aortic valve regurgitation is not  visualized. No aortic stenosis is present.   6. There is borderline dilatation of the aortic root, measuring 38 mm.   7. The inferior vena cava is normal in size with greater than 50%  respiratory variability, suggesting right atrial pressure of 3 mmHg.    Lynwood Geofm RIGGERS [facility] Short Stay Center/Anesthesiology Phone [phone] 11/25/2023 1:44 PM

## 2023-11-30 ENCOUNTER — Ambulatory Visit (HOSPITAL_COMMUNITY): Admitting: Anesthesiology

## 2023-11-30 ENCOUNTER — Other Ambulatory Visit: Payer: Self-pay

## 2023-11-30 ENCOUNTER — Encounter (HOSPITAL_COMMUNITY)
Admission: RE | Disposition: A | Discharge status: Home Health | Payer: Self-pay | Source: Home / Self Care | Attending: Orthopedic Surgery

## 2023-11-30 ENCOUNTER — Inpatient Hospital Stay (HOSPITAL_COMMUNITY)
Admission: RE | Admit: 2023-11-30 | Discharge: 2023-12-05 | DRG: 470 | Disposition: A | Discharge status: Home Health | Attending: Orthopedic Surgery | Admitting: Orthopedic Surgery

## 2023-11-30 ENCOUNTER — Ambulatory Visit (HOSPITAL_COMMUNITY): Payer: Self-pay | Admitting: Vascular Surgery

## 2023-11-30 ENCOUNTER — Encounter (HOSPITAL_COMMUNITY): Payer: Self-pay | Admitting: Orthopedic Surgery

## 2023-11-30 DIAGNOSIS — I10 Essential (primary) hypertension: Secondary | ICD-10-CM | POA: Diagnosis present

## 2023-11-30 DIAGNOSIS — E785 Hyperlipidemia, unspecified: Secondary | ICD-10-CM | POA: Diagnosis present

## 2023-11-30 DIAGNOSIS — I2510 Atherosclerotic heart disease of native coronary artery without angina pectoris: Secondary | ICD-10-CM | POA: Diagnosis present

## 2023-11-30 DIAGNOSIS — G8929 Other chronic pain: Secondary | ICD-10-CM | POA: Diagnosis present

## 2023-11-30 DIAGNOSIS — Z96651 Presence of right artificial knee joint: Principal | ICD-10-CM

## 2023-11-30 DIAGNOSIS — K219 Gastro-esophageal reflux disease without esophagitis: Secondary | ICD-10-CM | POA: Diagnosis present

## 2023-11-30 DIAGNOSIS — I739 Peripheral vascular disease, unspecified: Secondary | ICD-10-CM

## 2023-11-30 DIAGNOSIS — Z86718 Personal history of other venous thrombosis and embolism: Secondary | ICD-10-CM

## 2023-11-30 DIAGNOSIS — Z86711 Personal history of pulmonary embolism: Secondary | ICD-10-CM

## 2023-11-30 DIAGNOSIS — M1711 Unilateral primary osteoarthritis, right knee: Principal | ICD-10-CM | POA: Diagnosis present

## 2023-11-30 DIAGNOSIS — Z7901 Long term (current) use of anticoagulants: Secondary | ICD-10-CM

## 2023-11-30 DIAGNOSIS — R7303 Prediabetes: Secondary | ICD-10-CM | POA: Diagnosis present

## 2023-11-30 DIAGNOSIS — Z8349 Family history of other endocrine, nutritional and metabolic diseases: Secondary | ICD-10-CM

## 2023-11-30 DIAGNOSIS — Z8249 Family history of ischemic heart disease and other diseases of the circulatory system: Secondary | ICD-10-CM

## 2023-11-30 DIAGNOSIS — Z96652 Presence of left artificial knee joint: Secondary | ICD-10-CM | POA: Diagnosis present

## 2023-11-30 HISTORY — DX: Other complications of anesthesia, initial encounter: T88.59XA

## 2023-11-30 HISTORY — PX: TOTAL KNEE ARTHROPLASTY: SHX125

## 2023-11-30 SURGERY — ARTHROPLASTY, KNEE, TOTAL
Anesthesia: General | Site: Knee | Laterality: Right

## 2023-11-30 MED ORDER — FENTANYL CITRATE (PF) 250 MCG/5ML IJ SOLN
INTRAMUSCULAR | Status: AC
  Filled 2023-11-30: qty 5

## 2023-11-30 MED ORDER — FENTANYL CITRATE (PF) 250 MCG/5ML IJ SOLN
INTRAMUSCULAR | Status: DC | PRN
  Administered 2023-11-30 (×2): 100 ug via INTRAVENOUS
  Administered 2023-11-30: 50 ug via INTRAVENOUS

## 2023-11-30 MED ORDER — METOCLOPRAMIDE HCL 5 MG PO TABS: 5.0000 mg | ORAL_TABLET | Freq: Three times a day (TID) | ORAL | Status: DC | PRN

## 2023-11-30 MED ORDER — MENTHOL 3 MG MT LOZG
1.0000 | LOZENGE | OROMUCOSAL | Status: DC | PRN
Start: 2023-11-30 — End: 2023-12-05

## 2023-11-30 MED ORDER — ONDANSETRON HCL 4 MG/2ML IJ SOLN
4.0000 mg | Freq: Four times a day (QID) | INTRAMUSCULAR | Status: DC | PRN
  Administered 2023-11-30: 4 mg via INTRAVENOUS
  Filled 2023-11-30: qty 2

## 2023-11-30 MED ORDER — CEFAZOLIN SODIUM-DEXTROSE 2-4 GM/100ML-% IV SOLN
2.0000 g | Freq: Four times a day (QID) | INTRAVENOUS | Status: AC
  Administered 2023-11-30 (×2): 2 g via INTRAVENOUS
  Filled 2023-11-30 (×2): qty 100

## 2023-11-30 MED ORDER — BUPIVACAINE HCL (PF) 0.25 % IJ SOLN
INTRAMUSCULAR | Status: DC | PRN
  Administered 2023-11-30: 20 mL via PERINEURAL

## 2023-11-30 MED ORDER — OXYCODONE HCL 5 MG/5ML PO SOLN: 5.0000 mg | Freq: Once | ORAL | Status: AC | PRN

## 2023-11-30 MED ORDER — ORAL CARE MOUTH RINSE: 15.0000 mL | Freq: Once | OROMUCOSAL | Status: AC

## 2023-11-30 MED ORDER — HYDROMORPHONE HCL 1 MG/ML IJ SOLN
INTRAMUSCULAR | Status: DC | PRN
  Administered 2023-11-30: .5 mg via INTRAVENOUS

## 2023-11-30 MED ORDER — OXYCODONE HCL 5 MG PO TABS
10.0000 mg | ORAL_TABLET | ORAL | Status: DC | PRN
  Administered 2023-11-30: 10 mg via ORAL
  Administered 2023-11-30: 15 mg via ORAL
  Administered 2023-12-01: 10 mg via ORAL
  Administered 2023-12-01 – 2023-12-05 (×14): 15 mg via ORAL
  Administered 2023-12-05: 10 mg via ORAL
  Administered 2023-12-05: 15 mg via ORAL
  Filled 2023-11-30 (×10): qty 3
  Filled 2023-11-30: qty 2
  Filled 2023-11-30 (×6): qty 3
  Filled 2023-11-30: qty 2
  Filled 2023-11-30: qty 3

## 2023-11-30 MED ORDER — OXYCODONE HCL 5 MG PO TABS
5.0000 mg | ORAL_TABLET | Freq: Once | ORAL | Status: AC | PRN
  Administered 2023-11-30: 5 mg via ORAL

## 2023-11-30 MED ORDER — LIDOCAINE 2% (20 MG/ML) 5 ML SYRINGE
INTRAMUSCULAR | Status: DC | PRN
  Administered 2023-11-30: 100 mg via INTRAVENOUS

## 2023-11-30 MED ORDER — TRANEXAMIC ACID-NACL 1000-0.7 MG/100ML-% IV SOLN
1000.0000 mg | INTRAVENOUS | Status: DC
  Filled 2023-11-30: qty 100

## 2023-11-30 MED ORDER — APIXABAN 5 MG PO TABS
5.0000 mg | ORAL_TABLET | Freq: Two times a day (BID) | ORAL | Status: DC
  Administered 2023-11-30 – 2023-12-05 (×11): 5 mg via ORAL
  Filled 2023-11-30 (×11): qty 1

## 2023-11-30 MED ORDER — OXYCODONE HCL 5 MG PO TABS
5.0000 mg | ORAL_TABLET | ORAL | Status: DC | PRN
  Administered 2023-12-04 (×2): 10 mg via ORAL
  Filled 2023-11-30 (×3): qty 2

## 2023-11-30 MED ORDER — PROPOFOL 10 MG/ML IV BOLUS
INTRAVENOUS | Status: AC
  Filled 2023-11-30: qty 20

## 2023-11-30 MED ORDER — LABETALOL HCL 5 MG/ML IV SOLN
INTRAVENOUS | Status: AC
Start: 2023-11-30 — End: 2023-11-30
  Filled 2023-11-30: qty 4

## 2023-11-30 MED ORDER — VASHE WOUND IRRIGATION OPTIME
TOPICAL | Status: DC | PRN
  Administered 2023-11-30: 34 [oz_av]

## 2023-11-30 MED ORDER — CHLORHEXIDINE GLUCONATE 0.12 % MT SOLN
15.0000 mL | Freq: Once | OROMUCOSAL | Status: AC
  Administered 2023-11-30: 15 mL via OROMUCOSAL
  Filled 2023-11-30: qty 15

## 2023-11-30 MED ORDER — HYDROMORPHONE HCL 1 MG/ML IJ SOLN
INTRAMUSCULAR | Status: AC
  Filled 2023-11-30: qty 0.5

## 2023-11-30 MED ORDER — BISACODYL 10 MG RE SUPP
10.0000 mg | Freq: Every day | RECTAL | Status: DC | PRN
  Administered 2023-12-05: 10 mg via RECTAL
  Filled 2023-11-30: qty 1

## 2023-11-30 MED ORDER — DEXAMETHASONE SODIUM PHOSPHATE 10 MG/ML IJ SOLN
INTRAMUSCULAR | Status: DC | PRN
  Administered 2023-11-30: 5 mg via INTRAVENOUS

## 2023-11-30 MED ORDER — METOCLOPRAMIDE HCL 5 MG/ML IJ SOLN: 5.0000 mg | Freq: Three times a day (TID) | INTRAMUSCULAR | Status: DC | PRN

## 2023-11-30 MED ORDER — ROCURONIUM BROMIDE 10 MG/ML (PF) SYRINGE
PREFILLED_SYRINGE | INTRAVENOUS | Status: DC | PRN
  Administered 2023-11-30: 60 mg via INTRAVENOUS

## 2023-11-30 MED ORDER — DOCUSATE SODIUM 100 MG PO CAPS
100.0000 mg | ORAL_CAPSULE | Freq: Two times a day (BID) | ORAL | Status: DC
  Administered 2023-11-30 – 2023-12-05 (×11): 100 mg via ORAL
  Filled 2023-11-30 (×11): qty 1

## 2023-11-30 MED ORDER — MAGNESIUM CITRATE PO SOLN: 1.0000 | Freq: Once | ORAL | Status: DC | PRN

## 2023-11-30 MED ORDER — LACTATED RINGERS IV SOLN: INTRAVENOUS | Status: DC | PRN

## 2023-11-30 MED ORDER — FENTANYL CITRATE (PF) 100 MCG/2ML IJ SOLN
25.0000 ug | INTRAMUSCULAR | Status: DC | PRN
  Administered 2023-11-30 (×2): 25 ug via INTRAVENOUS
  Administered 2023-11-30: 50 ug via INTRAVENOUS

## 2023-11-30 MED ORDER — 0.9 % SODIUM CHLORIDE (POUR BTL) OPTIME
TOPICAL | Status: DC | PRN
  Administered 2023-11-30: 1000 mL

## 2023-11-30 MED ORDER — ACETAMINOPHEN 500 MG PO TABS
1000.0000 mg | ORAL_TABLET | Freq: Once | ORAL | Status: AC
  Administered 2023-11-30: 1000 mg via ORAL
  Filled 2023-11-30: qty 2

## 2023-11-30 MED ORDER — SODIUM CHLORIDE 0.9 % IV SOLN: INTRAVENOUS | Status: DC

## 2023-11-30 MED ORDER — PROPOFOL 10 MG/ML IV BOLUS
INTRAVENOUS | Status: DC | PRN
  Administered 2023-11-30: 150 mg via INTRAVENOUS

## 2023-11-30 MED ORDER — LABETALOL HCL 5 MG/ML IV SOLN
INTRAVENOUS | Status: DC | PRN
  Administered 2023-11-30: 5 mg via INTRAVENOUS

## 2023-11-30 MED ORDER — POLYETHYLENE GLYCOL 3350 17 G PO PACK
17.0000 g | PACK | Freq: Every day | ORAL | Status: DC | PRN
  Administered 2023-12-04: 17 g via ORAL
  Filled 2023-11-30 (×2): qty 1

## 2023-11-30 MED ORDER — PHENOL 1.4 % MT LIQD
1.0000 | OROMUCOSAL | Status: DC | PRN
Start: 2023-11-30 — End: 2023-12-05

## 2023-11-30 MED ORDER — TRANEXAMIC ACID 1000 MG/10ML IV SOLN
2000.0000 mg | Freq: Once | INTRAVENOUS | Status: AC
  Administered 2023-11-30: 2000 mg via TOPICAL
  Filled 2023-11-30: qty 20

## 2023-11-30 MED ORDER — HYDROMORPHONE HCL 1 MG/ML IJ SOLN
0.5000 mg | INTRAMUSCULAR | Status: DC | PRN
  Administered 2023-11-30: 0.5 mg via INTRAVENOUS
  Administered 2023-11-30 – 2023-12-04 (×15): 1 mg via INTRAVENOUS
  Filled 2023-11-30 (×17): qty 1

## 2023-11-30 MED ORDER — LACTATED RINGERS IV SOLN: INTRAVENOUS | Status: DC

## 2023-11-30 MED ORDER — CEFAZOLIN SODIUM-DEXTROSE 2-4 GM/100ML-% IV SOLN
2.0000 g | INTRAVENOUS | Status: AC
  Administered 2023-11-30: 2 g via INTRAVENOUS
  Filled 2023-11-30: qty 100

## 2023-11-30 MED ORDER — SUGAMMADEX SODIUM 200 MG/2ML IV SOLN
INTRAVENOUS | Status: DC | PRN
  Administered 2023-11-30: 200 mg via INTRAVENOUS

## 2023-11-30 MED ORDER — TRANEXAMIC ACID 1000 MG/10ML IV SOLN
INTRAVENOUS | Status: DC | PRN
  Administered 2023-11-30: 1000 mg via INTRAVENOUS

## 2023-11-30 MED ORDER — SODIUM CHLORIDE 0.9 % IR SOLN
Status: DC | PRN
  Administered 2023-11-30: 1000 mL

## 2023-11-30 MED ORDER — FENTANYL CITRATE (PF) 100 MCG/2ML IJ SOLN
INTRAMUSCULAR | Status: AC
Start: 2023-11-30 — End: 2023-11-30
  Filled 2023-11-30: qty 2

## 2023-11-30 MED ORDER — OXYCODONE HCL 5 MG PO TABS
ORAL_TABLET | ORAL | Status: AC
  Filled 2023-11-30: qty 1

## 2023-11-30 MED ORDER — ONDANSETRON HCL 4 MG/2ML IJ SOLN: 4.0000 mg | Freq: Once | INTRAMUSCULAR | Status: DC | PRN

## 2023-11-30 MED ORDER — ONDANSETRON HCL 4 MG PO TABS
4.0000 mg | ORAL_TABLET | Freq: Four times a day (QID) | ORAL | Status: DC | PRN
  Filled 2023-11-30: qty 1

## 2023-11-30 MED ORDER — ROSUVASTATIN CALCIUM 5 MG PO TABS
5.0000 mg | ORAL_TABLET | Freq: Every day | ORAL | Status: DC
  Administered 2023-11-30 – 2023-12-04 (×5): 5 mg via ORAL
  Filled 2023-11-30 (×5): qty 1

## 2023-11-30 MED ORDER — ONDANSETRON HCL 4 MG/2ML IJ SOLN
INTRAMUSCULAR | Status: DC | PRN
  Administered 2023-11-30: 4 mg via INTRAVENOUS

## 2023-11-30 MED ORDER — ACETAMINOPHEN 325 MG PO TABS
325.0000 mg | ORAL_TABLET | Freq: Four times a day (QID) | ORAL | Status: DC | PRN
  Administered 2023-12-01 – 2023-12-05 (×4): 650 mg via ORAL
  Filled 2023-11-30 (×4): qty 2

## 2023-11-30 SURGICAL SUPPLY — 45 items
BAG COUNTER SPONGE SURGICOUNT (BAG) ×1 IMPLANT
BLADE SAGITTAL 25.0X1.19X90 (BLADE) ×1 IMPLANT
BLADE SAW SGTL 13X75X1.27 (BLADE) ×1 IMPLANT
BLADE SURG 21 STRL SS (BLADE) ×2 IMPLANT
BNDG COHESIVE 6X5 TAN ST LF (GAUZE/BANDAGES/DRESSINGS) ×2 IMPLANT
BNDG GAUZE DERMACEA FLUFF 4 (GAUZE/BANDAGES/DRESSINGS) ×1 IMPLANT
BOWL SMART MIX CTS (DISPOSABLE) ×1 IMPLANT
COMPONENT PATELLA PEG 3 32 (Joint) IMPLANT
COOLER ICEMAN CLASSIC (MISCELLANEOUS) ×1 IMPLANT
COVER SURGICAL LIGHT HANDLE (MISCELLANEOUS) ×1 IMPLANT
CUFF TOURN SGL QUICK 42 (TOURNIQUET CUFF) IMPLANT
CUFF TRNQT CYL 34X4.125X (TOURNIQUET CUFF) ×1 IMPLANT
DRAPE EXTREMITY T 121X128X90 (DISPOSABLE) ×1 IMPLANT
DRAPE HALF SHEET 40X57 (DRAPES) ×2 IMPLANT
DRAPE U-SHAPE 47X51 STRL (DRAPES) ×1 IMPLANT
DRSG ADAPTIC 3X8 NADH LF (GAUZE/BANDAGES/DRESSINGS) ×1 IMPLANT
DURAPREP 26ML APPLICATOR (WOUND CARE) ×1 IMPLANT
ELECTRODE REM PT RTRN 9FT ADLT (ELECTROSURGICAL) ×1 IMPLANT
FACESHIELD WRAPAROUND OR TEAM (MASK) ×1 IMPLANT
GAUZE PAD ABD 8X10 STRL (GAUZE/BANDAGES/DRESSINGS) ×1 IMPLANT
GAUZE SPONGE 4X4 12PLY STRL (GAUZE/BANDAGES/DRESSINGS) ×1 IMPLANT
GLOVE BIOGEL PI IND STRL 9 (GLOVE) ×1 IMPLANT
GLOVE SURG ORTHO 9.0 STRL STRW (GLOVE) ×1 IMPLANT
GOWN STRL REUS W/ TWL XL LVL3 (GOWN DISPOSABLE) ×2 IMPLANT
INSERT TIBIA KNEE RIGHT 10 (Joint) IMPLANT
KIT BASIN OR (CUSTOM PROCEDURE TRAY) ×1 IMPLANT
KIT TURNOVER KIT B (KITS) ×1 IMPLANT
MANIFOLD NEPTUNE II (INSTRUMENTS) ×1 IMPLANT
NS IRRIG 1000ML POUR BTL (IV SOLUTION) ×1 IMPLANT
PACK TOTAL JOINT (CUSTOM PROCEDURE TRAY) ×1 IMPLANT
PAD ABD 8X10 STRL (GAUZE/BANDAGES/DRESSINGS) IMPLANT
PAD ARMBOARD POSITIONER FOAM (MISCELLANEOUS) ×1 IMPLANT
PAD COLD SHLDR WRAP-ON (PAD) ×1 IMPLANT
PIN DRILL HDLS TROCAR 75 4PK (PIN) IMPLANT
PROSTHESIS FEM KNEE PS STD9 RT (Joint) IMPLANT
SCREW FEMALE HEX FIX 25X2.5 (ORTHOPEDIC DISPOSABLE SUPPLIES) IMPLANT
SET HNDPC FAN SPRY TIP SCT (DISPOSABLE) ×1 IMPLANT
STAPLER SKIN PROX 35W (STAPLE) ×1 IMPLANT
STEM TIBIAL TRAB SZF RT (Stem) IMPLANT
SUCTION TUBE FRAZIER 10FR DISP (SUCTIONS) IMPLANT
SUT VIC AB 0 CT1 27XBRD ANBCTR (SUTURE) ×1 IMPLANT
SUT VIC AB 1 CT1 27XBRD ANBCTR (SUTURE) IMPLANT
SUT VIC AB 1 CTX36XBRD ANBCTR (SUTURE) IMPLANT
TOWEL GREEN STERILE (TOWEL DISPOSABLE) ×1 IMPLANT
TOWEL GREEN STERILE FF (TOWEL DISPOSABLE) ×1 IMPLANT

## 2023-11-30 NOTE — Evaluation (Signed)
 Physical Therapy Evaluation Patient Details Name: Leah Roberts MRN: [id number] DOB: 31-Jan-1951 Today's Date: 11/30/2023  History of Present Illness  Pt is a 73 y.o. female who presented 11/30/23 for elective R TKA. PMH: arthritis, DVT, GERD, HTN, PVD, pre-diabetes, PE  Clinical Impression  Pt presents with condition above and deficits mentioned below, see PT Problem List. PTA, she was mod I intermittently utilizing a hurrycane to mobilize and other times no AD. She lives with her sister, son, and her son's family in a 1-level house with a level entry. Currently, the pt is demonstrating deficits in R knee extension/flexion ROM and strength expected after a TKA. She is also displaying decreased R leg weight bearing tolerance, impacting her balance and activity tolerance. She is currently requiring minA for bed mobility, functional transfers, and to take a few pivotal steps with a RW. Pt declined further gait attempts due to pain and dizziness, VSS on RA. Provided pt with TKA HEP handout and educated her to not rest with a pillow under the bend of her knee but rather to rest with knee extended. She verbalized understanding. Follow physician's recommendations for discharge plan and follow up therapies. Will continue to follow acutely.      If plan is discharge home, recommend the following: A little help with walking and/or transfers;A little help with bathing/dressing/bathroom;Assistance with cooking/housework;Assist for transportation   Can travel by private vehicle        Equipment Recommendations None recommended by PT  Recommendations for Other Services  OT consult    Functional Status Assessment Patient has had a recent decline in their functional status and demonstrates the ability to make significant improvements in function in a reasonable and predictable amount of time.     Precautions / Restrictions Precautions Precautions: Fall Restrictions Weight Bearing Restrictions Per  Provider Order: Yes RLE Weight Bearing Per Provider Order: Weight bearing as tolerated      Mobility  Bed Mobility Overal bed mobility: Needs Assistance Bed Mobility: Supine to Sit     Supine to sit: Min assist, HOB elevated, Used rails     General bed mobility comments: MinA to support and assist R leg off L EOB and to support it while she scooted anteriorly to edge before placing foot on ground.    Transfers Overall transfer level: Needs assistance Equipment used: Rolling walker (2 wheels) Transfers: Sit to/from Stand, Bed to chair/wheelchair/BSC Sit to Stand: Min assist, From elevated surface   Step pivot transfers: Min assist       General transfer comment: EOB elevated with pt pushing up on EOB with bil hands to power up to stand, R foot placed anteriorly to L during sit <> stand transfers. MinA to power up to stand and gain balance then minA for balance when step pivoting to L from bed to recliner utilizing the RW for support.    Ambulation/Gait Ambulation/Gait assistance: Min assist Gait Distance (Feet): 3 Feet Assistive device: Rolling walker (2 wheels) Gait Pattern/deviations: Step-to pattern, Decreased stance time - right, Decreased weight shift to right, Trunk flexed, Antalgic Gait velocity: reduced Gait velocity interpretation: <1.31 ft/sec, indicative of household ambulator   General Gait Details: Pt takes slow, small, antalgic steps, ambulating with a step-to gait pattern. Cues provided for pt to push through her arms on the RW to offload the R leg during stance phase as needed to manage the pain. MinA for balance. Pt declined further gait attempts due to pain and dizziness this date, VSS on RA.  Stairs  Wheelchair Mobility     Tilt Bed    Modified Rankin (Stroke Patients Only)       Balance Overall balance assessment: Needs assistance Sitting-balance support: No upper extremity supported, Feet supported Sitting balance-[name]:  Fair Sitting balance - Comments: static sitting EOB with supervision for safety   Standing balance support: Bilateral upper extremity supported, During functional activity, Reliant on assistive device for balance Standing balance-[name]: Poor Standing balance comment: reliant on RW                             Pertinent Vitals/Pain Pain Assessment Pain Assessment: Faces Faces Pain Scale: Hurts even more Pain Location: R knee Pain Descriptors / Indicators: Discomfort, Grimacing, Operative site guarding, Moaning Pain Intervention(s): Limited activity within patient's tolerance, Monitored during session, Repositioned, Ice applied    Home Living Family/patient expects to be discharged to:: Private residence Living Arrangements: Other relatives (sister, son and his family) Available Help at Discharge: Family;Available 24 hours/day Type of Home: House Home Access: Level entry       Home Layout: One level Home Equipment: Shower seat;Grab bars - tub/shower;Other (comment);Rolling Walker (2 wheels);BSC/3in1 (hurrycane)      Prior Function Prior Level of Function : Independent/Modified Independent             Mobility Comments: Intermittently uses hurrycane, other times no AD ADLs Comments: Independent with ADLs     Extremity/Trunk Assessment   Upper Extremity Assessment Upper Extremity Assessment: Overall WFL for tasks assessed    Lower Extremity Assessment Lower Extremity Assessment: RLE deficits/detail RLE Deficits / Details: R knee displaying  approximately -15' extension and achieves ~30' flexion AAROM; denied numbness/tingling; edema noted throughout    Cervical / Trunk Assessment Cervical / Trunk Assessment: Normal  Communication   Communication Communication: No apparent difficulties    Cognition Arousal: Alert Behavior During Therapy: WFL for tasks assessed/performed   PT - Cognitive impairments: No apparent impairments                          Following commands: Intact       Cueing Cueing Techniques: Verbal cues     General Comments General comments (skin integrity, edema, etc.): provided pt with TKA HEP handout and educated her to not rest with a pillow under the bend of her knee but rather to rest with knee extended, she verbalized understanding; VSS on RA    Exercises     Assessment/Plan    PT Assessment Patient needs continued PT services  PT Problem List Decreased strength;Decreased range of motion;Decreased activity tolerance;Decreased balance;Decreased mobility;Pain       PT Treatment Interventions DME instruction;Gait training;Functional mobility training;Therapeutic activities;Therapeutic exercise;Balance training;Neuromuscular re-education;Patient/family education    PT Goals (Current goals can be found in the Care Plan section)  Acute Rehab PT Goals Patient Stated Goal: to recover well PT Goal Formulation: With patient Time For Goal Achievement: 12/14/23 Potential to Achieve Goals: Good    Frequency 7X/week     Co-evaluation               AM-PAC PT 6 Clicks Mobility  Outcome Measure Help needed turning from your back to your side while in a flat bed without using bedrails?: A Little Help needed moving from lying on your back to sitting on the side of a flat bed without using bedrails?: A Little Help needed moving to and from a bed to  a chair (including a wheelchair)?: A Little Help needed standing up from a chair using your arms (e.g., wheelchair or bedside chair)?: A Little Help needed to walk in hospital room?: Total (<20 ft) Help needed climbing 3-5 steps with a railing? : Total 6 Click Score: 14    End of Session Equipment Utilized During Treatment: Gait belt Activity Tolerance: Patient tolerated treatment well;Patient limited by pain Patient left: in chair;with call bell/phone within reach;with chair alarm set Nurse Communication: Mobility status PT Visit Diagnosis:  Unsteadiness on feet (R26.81);Other abnormalities of gait and mobility (R26.89);Muscle weakness (generalized) (M62.81);Difficulty in walking, not elsewhere classified (R26.2);Pain Pain - Right/Left: Right Pain - part of body: Knee    Time: 1334-1405 PT Time Calculation (min) (ACUTE ONLY): 31 min   Charges:   PT Evaluation $PT Eval Moderate Complexity: 1 Mod PT Treatments $Therapeutic Activity: 8-22 mins PT General Charges $$ ACUTE PT VISIT: 1 Visit         Theo Ferretti, PT, DPT Acute Rehabilitation Services  Office: [phone]   Theo CHRISTELLA Ferretti 11/30/2023, 2:24 PM

## 2023-11-30 NOTE — H&P (Signed)
 TOTAL KNEE ADMISSION H&P  Patient is being admitted for right total knee arthroplasty.  Subjective:  Chief Complaint:right knee pain.  HPI: Leah Roberts, 73 y.o. female, has a history of pain and functional disability in the right knee due to arthritis and has failed non-surgical conservative treatments for greater than 12 weeks to includeNSAID's and/or analgesics, corticosteriod injections, use of assistive devices, weight reduction as appropriate, and activity modification.  Onset of symptoms was gradual, starting 8 years ago with gradually worsening course since that time. The patient noted no past surgery on the right knee(s).  Patient currently rates pain in the right knee(s) at 8 out of 10 with activity. Patient has night pain, worsening of pain with activity and weight bearing, pain that interferes with activities of daily living, pain with passive range of motion, crepitus, and joint swelling.  Patient has evidence of subchondral cysts, subchondral sclerosis, periarticular osteophytes, joint subluxation, and joint space narrowing by imaging studies. This patient has had avascular necrosis of the knee. There is no active infection.  Patient Active Problem List   Diagnosis Date Noted   Anticoagulated 11/23/2023   Burn 06/27/2023   Hemorrhoid 03/23/2023   RVF (right ventricular failure) (HCC) 01/12/2023   Atopic dermatitis 01/12/2023   Shortness of breath 08/29/2022   Encounter for screening mammogram for malignant neoplasm of breast 08/29/2022   Right groin pain 01/13/2022   Bilateral pulmonary embolism with right heart strain RV/LV ratio 1:5 (HCC) 12/31/2021   Acute respiratory failure with hypoxia (HCC) 12/31/2021   Total knee replacement status, left 12/09/21 12/09/2021   Unilateral primary osteoarthritis, left knee    Shoulder disorder 09/03/2021   Arthritis of knee 09/03/2021   Lumbar radiculopathy 09/03/2021   Abnormal MRI, knee 09/03/2021   Chronic pain of right knee  09/03/2021   Bronchitis 03/25/2021   Anemia 01/09/2020   HTN (hypertension) 12/31/2019   Chest pain 12/31/2019   DOE (dyspnea on exertion) 12/31/2019   Acute coronary syndrome (HCC) 12/31/2019   Unstable angina (HCC) 12/31/2019   Atherosclerosis 08/23/2018   Screening for tuberculosis 07/12/2018   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   Arthralgia 07/12/2018   Solitary pulmonary nodule 07/12/2018   Neck pain 02/15/2018   Dizziness 02/15/2018   Elevated TSH 02/15/2018   Screening for HIV (human immunodeficiency virus) 10/05/2017   Neuropathy 10/05/2017   Screening for colon cancer 10/05/2017   Hyperlipidemia 08/04/2016   Prediabetes 08/04/2016   Back pain 08/04/2016   Bilateral knee pain 06/18/2016   History of DVT of lower extremity 04/01/2014   History of DVT and PE (deep vein thrombosis) 05/04/2011   Past Medical History:  Diagnosis Date   Arthritis    Complication of anesthesia    Hard to wake up   DVT (deep venous thrombosis) (HCC) 2013   Dyspnea    due to knee pain   GERD (gastroesophageal reflux disease)    History of blood transfusion    History of hypertension    Hypertension    Peripheral vascular disease (HCC)    DVT   Pre-diabetes    Pulmonary embolism (HCC) 2013    Past Surgical History:  Procedure Laterality Date   APPENDECTOMY     PULMONARY THROMBECTOMY Bilateral 01/01/2022   Procedure: PULMONARY THROMBECTOMY;  Surgeon: Jama Cordella MATSU, MD;  Location: [facility] INVASIVE CV LAB;  Service: Cardiovascular;  Laterality: Bilateral;   TONSILLECTOMY AND ADENOIDECTOMY     TOTAL KNEE ARTHROPLASTY Left 12/09/2021   Procedure: LEFT TOTAL KNEE ARTHROPLASTY;  Surgeon: Harden Jerona GAILS,  MD;  Location: [facility] OR;  Service: Orthopedics;  Laterality: Left;    Current Facility-Administered Medications  Medication Dose Route Frequency Provider Last Rate Last Admin   ceFAZolin  (ANCEF ) IVPB 2g/100 mL premix  2 g Intravenous On Call to OR Harden Jerona GAILS, MD       lactated ringers  infusion    Intravenous Continuous Maryclare Cornet, MD       tranexamic acid  (CYKLOKAPRON ) IVPB 1,000 mg  1,000 mg Intravenous To OR Harden Jerona GAILS, MD       Allergies  Allergen Reactions   Sulfa Antibiotics Swelling and Rash    Social History   Tobacco Use   Smoking status: Never   Smokeless tobacco: Never  Substance Use Topics   Alcohol use: Yes    Comment: Ocassional glass of wine    Family History  Problem Relation Age of Onset   Hypertension Father    Heart disease Father 29   Thyroid disease Sister    Allergies Sister    CVA Neg Hx    Heart attack Neg Hx    High Cholesterol Neg Hx    Colon cancer Neg Hx      Review of Systems  Objective:  Physical Exam  Vital signs in last 24 hours: Temp:  [97.5 F (36.4 C)] 97.5 F (36.4 C) (07/30 0602) Pulse Rate:  [77] 77 (07/30 0602) Resp:  [18] 18 (07/30 0602) BP: (147)/(94) 147/94 (07/30 0602) SpO2:  [96 %] 96 % (07/30 0602) Weight:  [105.7 kg] 105.7 kg (07/30 0602)  Labs:   Estimated body mass index is 38.77 kg/m as calculated from the following:   Height as of this encounter: 5' 5 (1.651 m).   Weight as of this encounter: 105.7 kg.   Imaging Review Plain radiographs demonstrate moderate degenerative joint disease of the right knee(s). The overall alignment ismild varus. The bone quality appears to be adequate for age and reported activity level.      Assessment/Plan:  End stage arthritis, right knee   The patient history, physical examination, clinical judgment of the provider and imaging studies are consistent with end stage degenerative joint disease of the right knee(s) and total knee arthroplasty is deemed medically necessary. The treatment options including medical management, injection therapy arthroscopy and arthroplasty were discussed at length. The risks and benefits of total knee arthroplasty were presented and reviewed. The risks due to aseptic loosening, infection, stiffness, patella tracking problems,  thromboembolic complications and other imponderables were discussed. The patient acknowledged the explanation, agreed to proceed with the plan and consent was signed. Patient is being admitted for inpatient treatment for surgery, pain control, PT, OT, prophylactic antibiotics, VTE prophylaxis, progressive ambulation and ADL's and discharge planning. The patient is planning to be discharged home with home health services     Patient's anticipated LOS is less than 2 midnights, meeting these requirements: - Younger than 57 - Lives within 1 hour of care - Has a competent adult at home to recover with post-op recover - NO history of  - Chronic pain requiring opiods  - Diabetes  - Coronary Artery Disease  - Heart failure  - Heart attack  - Stroke  - DVT/VTE  - Cardiac arrhythmia  - Respiratory Failure/COPD  - Renal failure  - Anemia  - Advanced Liver disease

## 2023-11-30 NOTE — Anesthesia Procedure Notes (Signed)
 Anesthesia Regional Block: Adductor canal block   Pre-Anesthetic Checklist: , timeout performed,  Correct Patient, Correct Site, Correct Laterality,  Correct Procedure, Correct Position, site marked,  Risks and benefits discussed,  Surgical consent,  Pre-op evaluation,  At surgeon's request and post-op pain management  Laterality: Lower and Right  Prep: chloraprep       Needles:  Injection technique: Single-shot  Needle Type: Echogenic Needle     Needle Length: 9cm  Needle Gauge: 21     Additional Needles:   Procedures:,,,, ultrasound used (permanent image in chart),,    Narrative:  Start time: 11/30/2023 7:19 AM End time: 11/30/2023 7:23 AM Injection made incrementally with aspirations every 5 mL.  Performed by: Personally  Anesthesiologist: Boone Fess, MD  Additional Notes: Patient's chart reviewed and they were deemed appropriate candidate for procedure, per surgeon's request. Patient educated about risks, benefits, and alternatives of the block including but not limited to: temporary or permanent nerve damage, bleeding, infection, damage to surround tissues, block failure, local anesthetic toxicity. Patient expressed understanding. A formal time-out was conducted consistent with institution rules.  Monitors were applied, and minimal sedation used (see nursing record). The site was prepped with skin prep and allowed to dry, and sterile gloves were used. A high frequency linear ultrasound probe with probe cover was utilized throughout. Femoral artery visualized at mid-thigh level, local anesthetic injected anterolateral to it, and echogenic block needle trajectory was monitored throughout. Hydrodissection of saphenous nerve visualized and appeared anatomically normal. Aspiration performed every 5ml. Blood vessels were avoided. All injections were performed without resistance and free of blood and paresthesias. The patient tolerated the procedure well.  Injectate: 20ml 0.25%  bupivacaine 

## 2023-11-30 NOTE — Anesthesia Postprocedure Evaluation (Signed)
 Anesthesia Post Note  Patient: Leah Roberts  Procedure(s) Performed: ARTHROPLASTY, KNEE, TOTAL (Right: Knee)     Patient location during evaluation: PACU Anesthesia Type: General Level of consciousness: awake and alert Pain management: pain level controlled Vital Signs Assessment: post-procedure vital signs reviewed and stable Respiratory status: spontaneous breathing, nonlabored ventilation, respiratory function stable and patient connected to nasal cannula oxygen Cardiovascular status: blood pressure returned to baseline and stable Postop Assessment: no apparent nausea or vomiting Anesthetic complications: no   No notable events documented.  Last Vitals:  Vitals:   11/30/23 0930 11/30/23 0945  BP: (!) 166/99 (!) 161/93  Pulse: 66 63  Resp: 13 13  Temp:    SpO2: 96% 96%    Last Pain:  Vitals:   11/30/23 0937  TempSrc:   PainSc: 10-Worst pain ever                 Rome Ade

## 2023-11-30 NOTE — TOC Initial Note (Signed)
 Transition of Care (TOC) - Initial/Assessment Note   Spoke to patient at bedside. Patient from home with sister. Sister works.   Confirmed with patient she does not have insurance. Referral to financial counselor placed.   Patient does have PCP [name]  Patient has walker and 3 in 1 at home already.   Darleene with [facility] accepted referral for charity for HHPT  Patient aware    Patient Details  Name: Leah Roberts MRN: [id number] Date of Birth: 01-10-1951  Transition of Care [facility]) CM/SW Contact:    Stephane Powell Jansky, RN Phone Number: 11/30/2023, 12:22 PM  Clinical Narrative:                   Expected Discharge Plan: Home w Home Health Services Barriers to Discharge: Continued Medical Work up   Patient Goals and CMS Choice Patient states their goals for this hospitalization and ongoing recovery are:: to return to home CMS Medicare.gov Compare Post Acute Care list provided to:: Patient Choice offered to / list presented to : Patient      Expected Discharge Plan and Services   Discharge Planning Services: CM Consult Post Acute Care Choice: Home Health Living arrangements for the past 2 months: Single Family Home                 DME Arranged: N/A DME Agency: NA       HH Arranged: PT HH Agency:  (see note)        Prior Living Arrangements/Services Living arrangements for the past 2 months: Single Family Home Lives with:: Siblings (sister) Patient language and need for interpreter reviewed:: Yes Do you feel safe going back to the place where you live?: Yes      Need for Family Participation in Patient Care: Yes (Comment) Care giver support system in place?: Yes (comment) Current home services: DME Criminal Activity/Legal Involvement Pertinent to Current Situation/Hospitalization: No - Comment as needed  Activities of Daily Living      Permission Sought/Granted   Permission granted to share information with : Yes, Verbal Permission Granted      Permission granted to share info w AGENCY: [facility]        Emotional Assessment Appearance:: Appears stated age Attitude/Demeanor/Rapport: Engaged Affect (typically observed): Appropriate Orientation: : Oriented to Self, Oriented to Place, Oriented to  Time, Oriented to Situation Alcohol / Substance Use: Not Applicable Psych Involvement: No (comment)  Admission diagnosis:  Primary osteoarthritis of right knee [M17.11] Total knee replacement status, right [Z96.651] Patient Active Problem List   Diagnosis Date Noted   Total knee replacement status, right 11/30/2023   Unilateral primary osteoarthritis, right knee 11/30/2023   Anticoagulated 11/23/2023   Burn 06/27/2023   Hemorrhoid 03/23/2023   RVF (right ventricular failure) (HCC) 01/12/2023   Atopic dermatitis 01/12/2023   Shortness of breath 08/29/2022   Encounter for screening mammogram for malignant neoplasm of breast 08/29/2022   Right groin pain 01/13/2022   Bilateral pulmonary embolism with right heart strain RV/LV ratio 1:5 (HCC) 12/31/2021   Acute respiratory failure with hypoxia (HCC) 12/31/2021   Total knee replacement status, left 12/09/21 12/09/2021   Unilateral primary osteoarthritis, left knee    Shoulder disorder 09/03/2021   Arthritis of knee 09/03/2021   Lumbar radiculopathy 09/03/2021   Abnormal MRI, knee 09/03/2021   Chronic pain of right knee 09/03/2021   Bronchitis 03/25/2021   Anemia 01/09/2020   HTN (hypertension) 12/31/2019   Chest pain 12/31/2019   DOE (dyspnea on exertion) 12/31/2019  Acute coronary syndrome (HCC) 12/31/2019   Unstable angina (HCC) 12/31/2019   Atherosclerosis 08/23/2018   Screening for tuberculosis 07/12/2018   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   Arthralgia 07/12/2018   Solitary pulmonary nodule 07/12/2018   Neck pain 02/15/2018   Dizziness 02/15/2018   Elevated TSH 02/15/2018   Screening for HIV (human immunodeficiency virus) 10/05/2017   Neuropathy 10/05/2017   Screening for colon  cancer 10/05/2017   Hyperlipidemia 08/04/2016   Prediabetes 08/04/2016   Back pain 08/04/2016   Bilateral knee pain 06/18/2016   History of DVT of lower extremity 04/01/2014   History of DVT and PE (deep vein thrombosis) 05/04/2011   PCP:  Dineen Rollene MATSU, FNP Pharmacy:   [facility] - [facility] [address] [place] [address] Phone: [phone] Fax: [phone]  [facility] - [facility] [address] [place] [address] Phone: [phone] Fax: [phone]  [facility] - Closed Permanently [address], [address] [place] [address] Phone: [phone] Fax: [phone]  [id number] [place], [place] - [address] AT [facility] [address] [place] [address] Phone: [phone] Fax: [phone]     Social Drivers of Health (SDOH) Social History: SDOH Screenings   Food Insecurity: No Food Insecurity (11/30/2023)  Housing: Unknown (11/30/2023)  Transportation Needs: No Transportation Needs (11/30/2023)  Utilities: Not At Risk (11/30/2023)  Depression (PHQ2-9): Medium Risk (11/23/2023)  Financial Resource Strain: Low Risk  (04/27/2021)   Received from [facility]  Physical Activity: Insufficiently Active (04/27/2021)   Received from [facility]  Social Connections: Unknown (11/30/2023)  Stress: No Stress Concern Present (04/27/2021)   Received from [facility]  Tobacco Use: Low Risk  (11/30/2023)   SDOH Interventions:     Readmission Risk Interventions     No data to display

## 2023-11-30 NOTE — Transfer of Care (Signed)
 Immediate Anesthesia Transfer of Care Note  Patient: Leah Roberts  Procedure(s) Performed: ARTHROPLASTY, KNEE, TOTAL (Right: Knee)  Patient Location: PACU  Anesthesia Type:General and Regional  Level of Consciousness: awake, alert , and oriented  Airway & Oxygen Therapy: Patient Spontanous Breathing and Patient connected to nasal cannula oxygen  Post-op Assessment: Report given to RN and Post -op Vital signs reviewed and stable  Post vital signs: Reviewed and stable  Last Vitals:  Vitals Value Taken Time  BP 167/98 11/30/23 09:09  Temp 36.6 C 11/30/23 09:08  Pulse 73 11/30/23 09:11  Resp 15 11/30/23 09:12  SpO2 90 % 11/30/23 09:11  Vitals shown include unfiled device data.  Last Pain:  Vitals:   11/30/23 0620  TempSrc:   PainSc: 7       Patients Stated Pain Goal: 2 (11/30/23 9379)  Complications: No notable events documented.

## 2023-11-30 NOTE — Progress Notes (Signed)
 Pt admitted to rm 22 from PACU. Obtained vss. Call bell within reach.   Amado GORMAN Arabia, RN

## 2023-11-30 NOTE — Discharge Instructions (Signed)

## 2023-11-30 NOTE — Op Note (Signed)
 DATE OF SURGERY:  11/30/2023  TIME: 9:12 AM  PATIENT NAME:  Leah Roberts    AGE: 73 y.o.    PRE-OPERATIVE DIAGNOSIS:  osteoarthritis right knee  POST-OPERATIVE DIAGNOSIS:  osteoarthritis right knee  PROCEDURE:  Procedure(s): ARTHROPLASTY, KNEE, TOTAL  SURGEON: Jerona Sage  ASSISTANT: April green  OPERATIVE IMPLANTS: Zimmer Persona  Femur size 9, Tibia size F  2- Peg fixed bearing, Patella size 32 1-peg oval button, with a 10 mm polyethylene insert medial congruent.  @ENCIMAGES @  Implant Name Type Inv. Item Serial No. Manufacturer Lot No. LRB No. Used Action  PROSTHESIS FEM KNEE PS STD9 RT - [id number] Joint PROSTHESIS FEM KNEE PS STD9 RT  [employer]MER [employer](ORTH,TRAU,BIO,SG) [id number] Right 1 Implanted  COMPONENT PATELLA PEG 3 32 - [id number] Joint COMPONENT PATELLA PEG 3 32  [employer]IM[employer](ORTH,TRAU,BIO,SG) [id number] Right 1 Implanted  STEM TIBIAL TRAB SZF RT - [id number] Stem STEM TIBIAL TRAB SZF RT  [employer]MER [employer](ORTH,TRAU,BIO,SG) [id number] Right 1 Implanted  INSERT TIBIA KNEE RIGHT 10 - [id number] Joint INSERT TIBIA KNEE RIGHT 10  [employer]IMMER [employer]ON(ORTH,TRAU,BIO,SG) [id number] Right 1 Implanted      PREOPERATIVE INDICATIONS:   Leah Roberts is a 73 y.o. year old female with end stage degenerative arthritis of the knee who failed conservative treatment and elected for Total Knee Arthroplasty.   The risks, benefits, and alternatives were discussed at length including but not limited to the risks of infection, bleeding, nerve injury, stiffness, blood clots, the need for revision surgery, cardiopulmonary complications, among others, and they were willing to proceed.  OPERATIVE DESCRIPTION:  The patient was brought to the operative room and placed in a supine position.  Anesthesia was administered.  IV antibiotics were given.  IV TXA was initiated.  The lower extremity was prepped and draped in the usual sterile fashion.  Ioban was used to cover all exposed skin. Time out was  performed.    Anterior quadriceps tendon splitting approach was performed.  The patella was everted and osteophytes were removed.  The anterior horn of the medial and lateral meniscus was removed.   The distal femur was opened with the drill and the intramedullary distal femoral cutting jig was utilized, set at 5 degrees valgus resecting 9 mm off the distal femur.  Care was taken to protect the collateral ligaments.  Then the extramedullary tibial cutting jig was utilized set for 3 degree posterior slope.  Care was taken during the cut to protect the medial and collateral ligaments.  The proximal tibia was removed along with the posterior horns of the menisci.  The PCL was sacrificed.    The extensor gap was measured and was approximately 10 mm.    The distal femoral sizing jig was applied, taking care to avoid notching.  Then the 4-in-1 cutting jig was applied and the anterior and posterior femur was cut, along with the chamfer cuts.  All posterior osteophytes were removed.  The flexion gap was then measured and was symmetric with the extension gap.  The distal femoral preparation using the appropriate jig to prepare the box.  The patella was then measured, and cut with the saw.    The proximal tibia sized and prepared accordingly with the reamer and the punch, and then all components were trialed with the poly insert.  The knee was found to have stable balance and full motion.  The knee was irrigated with normal saline, topical 2 g TXA was used to soak the wound.  The above named components were then press fit into  place.  The final polyethylene component was placed.  The knee was then taken through a range of motion and the patella tracked well and the knee irrigated copiously and the parapatellar and subcutaneous tissue closed with vicryl, and skin closed with staples..  A sterile dressing was applied and patient  was taken to the PACU in stable  condition.  There were no  complications.  Total tourniquet time was 60 minutes.

## 2023-11-30 NOTE — Anesthesia Procedure Notes (Signed)
 Procedure Name: Intubation Date/Time: 11/30/2023 7:42 AM  Performed by: Scherrie Mast, CRNAPre-anesthesia Checklist: Patient identified, Emergency Drugs available, Suction available and Patient being monitored Patient Re-evaluated:Patient Re-evaluated prior to induction Oxygen Delivery Method: Circle System Utilized Preoxygenation: Pre-oxygenation with 100% oxygen Induction Type: IV induction Ventilation: Mask ventilation without difficulty Laryngoscope Size: Mac and 4 Grade View: Grade I Tube type: Oral Tube size: 7.0 mm Number of attempts: 1 Airway Equipment and Method: Stylet and Oral airway Placement Confirmation: ETT inserted through vocal cords under direct vision, positive ETCO2 and breath sounds checked- equal and bilateral Secured at: 22 cm Tube secured with: Tape Dental Injury: Teeth and Oropharynx as per pre-operative assessment

## 2023-12-01 ENCOUNTER — Other Ambulatory Visit: Payer: Self-pay

## 2023-12-01 ENCOUNTER — Encounter (HOSPITAL_COMMUNITY): Payer: Self-pay | Admitting: Orthopedic Surgery

## 2023-12-01 MED ORDER — OXYCODONE-ACETAMINOPHEN 5-325 MG PO TABS
1.0000 | ORAL_TABLET | ORAL | 0 refills | Status: DC | PRN
  Filled 2023-12-01: qty 30, 5d supply, fill #0

## 2023-12-01 NOTE — Evaluation (Signed)
 Occupational Therapy Evaluation Patient Details Name: Leah Roberts MRN: [id number] DOB: 1951-02-17 Today's Date: 12/01/2023   History of Present Illness   Pt is a 73 y.o. female who presented 11/30/23 for elective R TKA. PMH: arthritis, DVT, GERD, HTN, PVD, pre-diabetes, PE     Clinical Impressions At baseline, pt is Ind with ADLs and IADLs and Ind to Mod I for functional mobility with intermittent use of a hurrycane. Pt now presents with R LE pain affecting functional level, decreased activity tolerance, decreased balance, and decreased safety and independence with functional tasks. Pt currently able to complete UB ADLs Ind to Set up and LB ADLs with Max assist +2. Pt able to clear bottom off chair in STS attempt this session with use of RW and Max assist +1, but was unable to come to full stand due to pain. Pt also reporting dizziness worse in sitting and standing with BP, HR, and O2 sat stable on RA throughout session and in all positions. RN and MD notified of pt pain level and report of dizziness. Pt participated well and is motivated to return to PLOF. Suspect pt will progress quickly toward OT goals once pain management improves. Pt will benefit from acute skilled OT services to address deficits and increase safety and independence with functional tasks. Post acute discharge, pt will benefit from [facility] OT to maximize rehab potential.      If plan is discharge home, recommend the following:   A lot of help with bathing/dressing/bathroom;A lot of help with walking and/or transfers;Assistance with cooking/housework;Assist for transportation;Help with stairs or ramp for entrance     Functional Status Assessment   Patient has had a recent decline in their functional status and demonstrates the ability to make significant improvements in function in a reasonable and predictable amount of time.     Equipment Recommendations   None recommended by OT (Pt already has needed  equipment)     Recommendations for Other Services         Precautions/Restrictions   Precautions Precautions: Fall Restrictions Weight Bearing Restrictions Per Provider Order: Yes RLE Weight Bearing Per Provider Order: Weight bearing as tolerated     Mobility Bed Mobility Overal bed mobility: Needs Assistance             General bed mobility comments: Pt sitting in recliner at beginning and end of session    Transfers Overall transfer level: Needs assistance Equipment used: Rolling walker (2 wheels) Transfers: Sit to/from Stand Sit to Stand: Max assist           General transfer comment: Pt required Max assist +1 this session to clear bottom from chair but was unable to come to full stand secondary to pain level      Balance Overall balance assessment: Needs assistance Sitting-balance support: No upper extremity supported, Feet supported Sitting balance-[name]: Fair     Standing balance support: Bilateral upper extremity supported, During functional activity, Reliant on assistive device for balance Standing balance-[name]: Poor Standing balance comment: reliant on RW and assist of therapist and unable to come to full stand this session due to pain                           ADL either performed or assessed with clinical judgement   ADL Overall ADL's : Needs assistance/impaired Eating/Feeding: Independent;Sitting   Grooming: Set up;Sitting   Upper Body Bathing: Set up;Contact guard assist;Sitting   Lower Body Bathing: Maximal  assistance;+2 for safety/equipment;Sitting/lateral leans;Sit to/from stand   Upper Body Dressing : Set up;Sitting   Lower Body Dressing: Maximal assistance;+2 for safety/equipment;+2 for physical assistance;Sitting/lateral leans;Sit to/from Market researcher Details (indicate cue type and reason): pt unable to come to full stand with +1 assist this session due to R LE pain Toileting- Clothing  Manipulation and Hygiene: Maximal assistance;+2 for physical assistance;+2 for safety/equipment;Sitting/lateral lean;Sit to/from stand         General ADL Comments: Pt funcitonal level significantly affected by pain level this session     Vision Patient Visual Report: No change from baseline Vision Assessment?: No apparent visual deficits Additional Comments: Vision [facility] for tasks assessed     Perception         Praxis         Pertinent Vitals/Pain Pain Assessment Pain Assessment: 0-10 Pain Score: 9  Pain Location: R knee Pain Descriptors / Indicators: Discomfort, Grimacing, Operative site guarding, Moaning, Guarding, Restless, Other (Comment) (tearing up) Pain Intervention(s): Limited activity within patient's tolerance, Monitored during session, Premedicated before session, Repositioned, Other (comment) (Ice machine in use; RN and MD notified of pt pain level)     Extremity/Trunk Assessment Upper Extremity Assessment Upper Extremity Assessment: Right hand dominant;Overall [facility] for tasks assessed   Lower Extremity Assessment Lower Extremity Assessment: Defer to PT evaluation   Cervical / Trunk Assessment Cervical / Trunk Assessment: Normal   Communication Communication Communication: No apparent difficulties   Cognition Arousal: Alert Behavior During Therapy: WFL for tasks assessed/performed Cognition: No apparent impairments             OT - Cognition Comments: Pt AAOx4 and pleasant throughout session. Cognition WFL for tasks assessed                 Following commands: Intact       Cueing  General Comments   Cueing Techniques: Verbal cues  Pt reports dizziness throughout session with dizziness worse in sitting and standing but with BP, HR, and O2 stable on RA in all positions. Pt's son present on speaker phone during a portion of session.   Exercises     Shoulder Instructions      Home Living Family/patient expects to be discharged to::  Private residence Living Arrangements: Other relatives (lives with sister; son and daughter-in-law are visiting and can assist until Sunday, 8/3) Available Help at Discharge: Family;Available 24 hours/day Type of Home: House Home Access: Level entry     Home Layout: One level     Bathroom Shower/Tub: Tub/shower unit;Walk-in shower   Bathroom Toilet: Handicapped height Bathroom Accessibility: Yes How Accessible: Accessible via walker Home Equipment: Shower seat;Grab bars - tub/shower;Other (comment);Rolling Walker (2 wheels);BSC/3in1   Additional Comments: Pt lives with her sister. Pt reports her sister recently had back surgery and is not able to provide pt with physical assistance. Pt's son and DIL are currently visiting from [facility] to assist pt, but they plan to return hom on Sunday, 8/3. Pt reports her DIL is making and freezing meal for pt and her sister to warm up once her son and DIL return to [facility].      Prior Functioning/Environment Prior Level of Function : Independent/Modified Independent             Mobility Comments: Intermittently uses hurrycane, other times no AD ADLs Comments: Independent with ADLs and IADLs    OT Problem List: Decreased activity tolerance;Impaired balance (sitting and/or standing);Pain   OT Treatment/Interventions: Self-care/ADL training;Energy conservation;DME  and/or AE instruction;Therapeutic activities;Patient/family education;Balance training      OT Goals(Current goals can be found in the care plan section)   Acute Rehab OT Goals Patient Stated Goal: to have less pain OT Goal Formulation: With patient/family Time For Goal Achievement: 12/15/23 Potential to Achieve Goals: Good ADL Goals Pt Will Perform Lower Body Bathing: with min assist;sitting/lateral leans;sit to/from stand (with adaptive equipment as needed) Pt Will Perform Lower Body Dressing: with min assist;sitting/lateral leans;sit to/from stand (with adaptive equipment as  needed) Pt Will Transfer to Toilet: with contact guard assist;ambulating;regular height toilet (with least restrictive AD) Pt Will Perform Toileting - Clothing Manipulation and hygiene: with supervision;sitting/lateral leans;sit to/from stand Additional ADL Goal #1: Patient will demonstrate understanding through teach back of education in use of breathing, meditation, mindfulness, and sensory-based techniques for pain management.   OT Frequency:  Min 2X/week    Co-evaluation              AM-PAC OT 6 Clicks Daily Activity     Outcome Measure Help from another person eating meals?: None Help from another person taking care of personal grooming?: A Little Help from another person toileting, which includes using toliet, bedpan, or urinal?: A Lot Help from another person bathing (including washing, rinsing, drying)?: A Lot Help from another person to put on and taking off regular upper body clothing?: A Little Help from another person to put on and taking off regular lower body clothing?: A Lot 6 Click Score: 16   End of Session Equipment Utilized During Treatment: Gait belt;Rolling walker (2 wheels);Other (comment) (ice machine) Nurse Communication: Mobility status;Other (comment) (Pain level; dizziness with VSS on RA; concern regarding plan for pt to d/c today; MD also notified of the above)  Activity Tolerance: Patient limited by pain Patient left: in chair;with call bell/phone within reach;with chair alarm set  OT Visit Diagnosis: Unsteadiness on feet (R26.81);Other abnormalities of gait and mobility (R26.89);Pain                Time: 1001-1038 OT Time Calculation (min): 37 min Charges:  OT General Charges $OT Visit: 1 Visit OT Evaluation $OT Eval Moderate Complexity: 1 Mod OT Treatments $Self Care/Home Management : 8-22 mins  Margarie Rockey HERO., OTR/L, MA Acute Rehab [phone]   Margarie FORBES Horns 12/01/2023, 1:24 PM

## 2023-12-01 NOTE — Plan of Care (Signed)
  Problem: Education: Goal: Knowledge of General Education information will improve Description: Including pain rating scale, medication(s)/side effects and non-pharmacologic comfort measures Outcome: Progressing   Problem: Health Behavior/Discharge Planning: Goal: Ability to manage health-related needs will improve Outcome: Progressing   Problem: Education: Goal: Knowledge of the prescribed therapeutic regimen will improve Outcome: Progressing

## 2023-12-01 NOTE — TOC Progression Note (Signed)
 Transition of Care (TOC) - Progression Note  Plan discharge this afternoon . [facility] with [facility] notified   Patient Details  Name: Leah Roberts MRN: [id number] Date of Birth: March 14, 1951  Transition of Care [facility]) CM/SW Contact  Toy Eisemann, Powell Jansky, RN Phone Number: 12/01/2023, 8:42 AM  Clinical Narrative:       Expected Discharge Plan: Home w Home Health Services Barriers to Discharge: Continued Medical Work up               Expected Discharge Plan and Services   Discharge Planning Services: CM Consult Post Acute Care Choice: Home Health Living arrangements for the past 2 months: Single Family Home Expected Discharge Date: 12/01/23               DME Arranged: N/A DME Agency: NA       HH Arranged: PT HH Agency:  (see note)         Social Drivers of Health (SDOH) Interventions SDOH Screenings   Food Insecurity: No Food Insecurity (11/30/2023)  Housing: Low Risk  (11/30/2023)  Transportation Needs: No Transportation Needs (11/30/2023)  Utilities: Not At Risk (11/30/2023)  Depression (PHQ2-9): Medium Risk (11/23/2023)  Financial Resource Strain: Low Risk  (04/27/2021)   Received from [facility]  Physical Activity: Insufficiently Active (04/27/2021)   Received from [facility]  Social Connections: Unknown (11/30/2023)  Stress: No Stress Concern Present (04/27/2021)   Received from [facility]  Tobacco Use: Low Risk  (11/30/2023)    Readmission Risk Interventions     No data to display

## 2023-12-01 NOTE — Progress Notes (Signed)
 Patient ID: Leah Roberts, female   DOB: 1950-06-25, 73 y.o.   MRN: [id number] Patient is postop day 1 total knee arthroplasty.  Patient did have increased pain last night secondary to not getting a block from her being on her blood thinner.  Will plan for discharge this afternoon with a prescription for 10 mg Percocet.  Patient was instructed to continue using her ice machine.

## 2023-12-01 NOTE — Progress Notes (Signed)
 Physical Therapy Treatment Patient Details Name: Leah Roberts MRN: [id number] DOB: Dec 24, 1950 Today's Date: 12/01/2023   History of Present Illness Pt is a 73 y.o. female who presented 11/30/23 for elective R TKA. PMH: arthritis, DVT, GERD, HTN, PVD, pre-diabetes, PE    PT Comments  PM session: Pt making good progress towards acute goals this session, require grossly less assist for all mobility and demonstrating increased weight bearing tolerance on R, progressing gait distance >40' with RW for support, however continues to be limited by pain. Pt completing bed mobility and transfers to stand with min A with assist still needed to manage and support RLE. Pt demonstrating gait with CGA for safety with continued chair follow for safety. Pt up in chair at end of session and verbalizing nad demonstrating understanding of HEP exercises to complete between therapies. Pt continues to benefit from skilled PT services to progress toward functional mobility goals.     If plan is discharge home, recommend the following: A little help with walking and/or transfers;A little help with bathing/dressing/bathroom;Assistance with cooking/housework;Assist for transportation   Can travel by private vehicle        Equipment Recommendations  None recommended by PT    Recommendations for Other Services       Precautions / Restrictions Precautions Precautions: Fall Restrictions Weight Bearing Restrictions Per Provider Order: Yes RLE Weight Bearing Per Provider Order: Weight bearing as tolerated     Mobility  Bed Mobility Overal bed mobility: Needs Assistance Bed Mobility: Supine to Sit     Supine to sit: HOB elevated, Used rails, Mod assist     General bed mobility comments: min A to support and assist R leg off L EOB and to support it while she scooted anteriorly to edge before placing foot on ground.    Transfers Overall transfer level: Needs assistance Equipment used: Rolling walker (2  wheels) Transfers: Sit to/from Stand, Bed to chair/wheelchair/BSC Sit to Stand: Min assist   Step pivot transfers: Min assist       General transfer comment: pt pushing up from EOB with RUE with LUE on RW,  R foot placed anteriorly to L during sit <> stand transfers. increased time to rise and elevate trunk to upright standing    Ambulation/Gait Ambulation/Gait assistance: Contact guard assist Gait Distance (Feet): 42 Feet Assistive device: Rolling walker (2 wheels) Gait Pattern/deviations: Step-to pattern, Decreased stance time - right, Decreased weight shift to right, Trunk flexed, Antalgic Gait velocity: reduced     General Gait Details: Pt takes slow, small, antalgic steps, ambulating with a step-to gait pattern. improved safety awareness with pt keeping hands on RW (not elbows) throguhout, light cues for upright trunk, pt contineus to demonstate decreased hip/knee flexion on R throughout   Stairs             Wheelchair Mobility     Tilt Bed    Modified Rankin (Stroke Patients Only)       Balance Overall balance assessment: Needs assistance Sitting-balance support: No upper extremity supported, Feet supported Sitting balance-[name]: Fair Sitting balance - Comments: static sitting EOB with supervision for safety   Standing balance support: Bilateral upper extremity supported, During functional activity, Reliant on assistive device for balance Standing balance-[name]: Poor Standing balance comment: reliant on RW                            Communication Communication Communication: No apparent difficulties  Cognition Arousal: Alert  Behavior During Therapy: WFL for tasks assessed/performed   PT - Cognitive impairments: No apparent impairments                         Following commands: Intact      Cueing Cueing Techniques: Verbal cues  Exercises General Exercises - Lower Extremity Ankle Circles/Pumps: AROM, Both, 10  reps Quad Sets: AROM, Right, 5 reps    General Comments General comments (skin integrity, edema, etc.): Pt reports dizziness throughout session with dizziness worse in sitting and standing but with BP, HR, and O2 stable on RA in all positions. Pt's son present on speaker phone during a portion of session.      Pertinent Vitals/Pain Pain Assessment Pain Assessment: Faces Faces Pain Scale: Hurts little more Pain Location: R knee Pain Descriptors / Indicators: Discomfort, Grimacing, Operative site guarding, Moaning Pain Intervention(s): Premedicated before session, Monitored during session, Limited activity within patient's tolerance, Ice applied    Home Living Family/patient expects to be discharged to:: Private residence Living Arrangements: Other relatives (lives with sister; son and daughter-in-law are visiting and can assist until Sunday, 8/3) Available Help at Discharge: Family;Available 24 hours/day Type of Home: House Home Access: Level entry       Home Layout: One level Home Equipment: Shower seat;Grab bars - tub/shower;Other (comment);Rolling Walker (2 wheels);BSC/3in1 Additional Comments: Pt lives with her sister. Pt reports her sister recently had back surgery and is not able to provide pt with physical assistance. Pt's son and DIL are currently visiting from [facility] to assist pt, but they plan to return hom on Sunday, 8/3. Pt reports her DIL is making and freezing meal for pt and her sister to warm up once her son and DIL return to [facility].    Prior Function            PT Goals (current goals can now be found in the care plan section) Acute Rehab PT Goals Patient Stated Goal: to recover well PT Goal Formulation: With patient Time For Goal Achievement: 12/14/23 Progress towards PT goals: Progressing toward goals    Frequency    7X/week      PT Plan      Co-evaluation              AM-PAC PT 6 Clicks Mobility   Outcome Measure  Help needed turning from your  back to your side while in a flat bed without using bedrails?: A Little Help needed moving from lying on your back to sitting on the side of a flat bed without using bedrails?: A Little Help needed moving to and from a bed to a chair (including a wheelchair)?: A Little Help needed standing up from a chair using your arms (e.g., wheelchair or bedside chair)?: A Little Help needed to walk in hospital room?: A Little Help needed climbing 3-5 steps with a railing? : Total 6 Click Score: 16    End of Session Equipment Utilized During Treatment: Gait belt Activity Tolerance: Patient tolerated treatment well;Patient limited by pain Patient left: in chair;with call bell/phone within reach;with SCD's reapplied;Other (comment) (with iceman applied on R) Nurse Communication: Mobility status PT Visit Diagnosis: Unsteadiness on feet (R26.81);Other abnormalities of gait and mobility (R26.89);Muscle weakness (generalized) (M62.81);Difficulty in walking, not elsewhere classified (R26.2);Pain Pain - Right/Left: Right Pain - part of body: Knee     Time: 8473-8454 PT Time Calculation (min) (ACUTE ONLY): 19 min  Charges:    $Gait Training: 8-22 mins PT General  Charges $$ ACUTE PT VISIT: 1 Visit                     Therisa R. PTA Acute Rehabilitation Services Office: [phone]   [name] 12/01/2023, 4:05 PM

## 2023-12-01 NOTE — Progress Notes (Signed)
 Physical Therapy Treatment Patient Details Name: Leah Roberts MRN: [id number] DOB: March 18, 1951 Today's Date: 12/01/2023   History of Present Illness Pt is a 73 y.o. female who presented 11/30/23 for elective R TKA. PMH: arthritis, DVT, GERD, HTN, PVD, pre-diabetes, PE    PT Comments  AM session: Pt making slow but steady progress towards acute goals this session as pt continues to be limited by pain, despite pre-medication and education on strategies for pain management with mobility. Pt requiring mod A to complete bed mobility and power up to stand with cues for RLE placement, sequencing and safe techniques as pt resting elbows on RW during step pivot transfer EOB>chair. Pt able to progress gait 15' with RW for support and min A to maintain balance with chair follow provided for safety as pt quick to fatigue needing multiple standing rest breaks over short distance as well as limited by pain. Continued education on exercises to complete between therapies and plan to continue to progress funtional mobility in PM session per pt tolerance. Pt continues to benefit from skilled PT services to progress toward functional mobility goals.      If plan is discharge home, recommend the following: A little help with walking and/or transfers;A little help with bathing/dressing/bathroom;Assistance with cooking/housework;Assist for transportation   Can travel by private vehicle        Equipment Recommendations  None recommended by PT    Recommendations for Other Services       Precautions / Restrictions Precautions Precautions: Fall Restrictions Weight Bearing Restrictions Per Provider Order: Yes RLE Weight Bearing Per Provider Order: Weight bearing as tolerated     Mobility  Bed Mobility Overal bed mobility: Needs Assistance Bed Mobility: Supine to Sit     Supine to sit: HOB elevated, Used rails, Mod assist     General bed mobility comments: ModA to support and assist R leg off L EOB  and to support it while she scooted anteriorly to edge before placing foot on ground.    Transfers Overall transfer level: Needs assistance Equipment used: Rolling walker (2 wheels) Transfers: Sit to/from Stand, Bed to chair/wheelchair/BSC Sit to Stand: From elevated surface, Mod assist   Step pivot transfers: Min assist       General transfer comment: EOB elevated with pt pushing up on EOB with bil hands to power up to stand, R foot placed anteriorly to L during sit <> stand transfers. modA to power up to stand and gain balance then minA for balance when step pivoting to L from bed to recliner utilizing the RW for support.    Ambulation/Gait Ambulation/Gait assistance: Min assist Gait Distance (Feet): 15 Feet Assistive device: Rolling walker (2 wheels) Gait Pattern/deviations: Step-to pattern, Decreased stance time - right, Decreased weight shift to right, Trunk flexed, Antalgic Gait velocity: reduced     General Gait Details: Pt takes slow, small, antalgic steps, ambulating with a step-to gait pattern. Cues provided for pt to push through her arms on the RW to offload the R leg during stance phase as needed to manage the pain as pt with tendency to rest elbows on RW and anteriorly flex trunk, MinA for balance and chair follow for safety.   Stairs             Wheelchair Mobility     Tilt Bed    Modified Rankin (Stroke Patients Only)       Balance Overall balance assessment: Needs assistance Sitting-balance support: No upper extremity supported, Feet supported Sitting balance-[name]:  Fair Sitting balance - Comments: static sitting EOB with supervision for safety   Standing balance support: Bilateral upper extremity supported, During functional activity, Reliant on assistive device for balance Standing balance-[name]: Poor Standing balance comment: reliant on RW                            Communication Communication Communication: No  apparent difficulties  Cognition Arousal: Alert Behavior During Therapy: WFL for tasks assessed/performed   PT - Cognitive impairments: No apparent impairments                         Following commands: Intact      Cueing Cueing Techniques: Verbal cues  Exercises General Exercises - Lower Extremity Quad Sets: AROM, Right, 5 reps    General Comments        Pertinent Vitals/Pain Pain Assessment Pain Assessment: Faces Faces Pain Scale: Hurts whole lot Pain Location: R knee Pain Descriptors / Indicators: Discomfort, Grimacing, Operative site guarding, Moaning Pain Intervention(s): Premedicated before session, Monitored during session, Limited activity within patient's tolerance, Ice applied, Repositioned    Home Living                          Prior Function            PT Goals (current goals can now be found in the care plan section) Acute Rehab PT Goals Patient Stated Goal: to recover well PT Goal Formulation: With patient Time For Goal Achievement: 12/14/23 Progress towards PT goals: Progressing toward goals    Frequency    7X/week      PT Plan      Co-evaluation              AM-PAC PT 6 Clicks Mobility   Outcome Measure  Help needed turning from your back to your side while in a flat bed without using bedrails?: A Little Help needed moving from lying on your back to sitting on the side of a flat bed without using bedrails?: A Little Help needed moving to and from a bed to a chair (including a wheelchair)?: A Little Help needed standing up from a chair using your arms (e.g., wheelchair or bedside chair)?: A Lot Help needed to walk in hospital room?: Total (<20 ft) Help needed climbing 3-5 steps with a railing? : Total 6 Click Score: 13    End of Session Equipment Utilized During Treatment: Gait belt Activity Tolerance: Patient tolerated treatment well;Patient limited by pain Patient left: in chair;with call bell/phone  within reach Nurse Communication: Mobility status PT Visit Diagnosis: Unsteadiness on feet (R26.81);Other abnormalities of gait and mobility (R26.89);Muscle weakness (generalized) (M62.81);Difficulty in walking, not elsewhere classified (R26.2);Pain Pain - Right/Left: Right Pain - part of body: Knee     Time: 9156-9083 PT Time Calculation (min) (ACUTE ONLY): 33 min  Charges:    $Gait Training: 8-22 mins $Therapeutic Activity: 8-22 mins PT General Charges $$ ACUTE PT VISIT: 1 Visit                     Josyah Achor R. PTA Acute Rehabilitation Services Office: [phone]   [name] 12/01/2023, 9:47 AM

## 2023-12-02 NOTE — Plan of Care (Signed)
  Problem: Elimination: Goal: Will not experience complications related to bowel motility Outcome: Progressing   Problem: Pain Managment: Goal: General experience of comfort will improve and/or be controlled Outcome: Progressing   Problem: Safety: Goal: Ability to remain free from injury will improve Outcome: Progressing   Problem: Pain Management: Goal: Pain level will decrease with appropriate interventions Outcome: Progressing   Problem: Skin Integrity: Goal: Will show signs of wound healing Outcome: Progressing

## 2023-12-02 NOTE — Progress Notes (Signed)
 Occupational Therapy Treatment Patient Details Name: Leah Roberts MRN: [id number] DOB: [birth year] Today's Date: 12/02/2023   History of present illness Pt is a 72 y.o. female who presented 11/30/23 for elective R TKA. PMH: arthritis, DVT, GERD, HTN, PVD, pre-diabetes, PE   OT comments  Co-treat with PTA to maximize pt activity tolerance and for functional mobility progression. OT focused on educating pt in techniques for increased safety and independence with ADLs. OT also educated pt in pursed lip breathing for improved pain management with pt demonstrating understanding through teach back. Pt currently completing ADLs largely Ind to Max assist. Pt also demonstrating ability to perform bed mobility with Min assist of +1 to +2 and functional mobility/transfers with a RW with Min to Mod assist of +1 to +2 with pt requiring increased assist as session progressed due to fatigue and pain. Pt participated well this session and is making slow progress toward OT goals. Pt will benefit from continued acute skilled OT services. Secondary to slow progress and current pain level, pt would benefit from intensive inpatient skilled rehab services < 3 hours per day; however, pt insurance does not cover this. Due to this, post-acute [facility] OT services are recommended to maximize rehab potential, decrease risk of falls, and decrease risk of re-hospitalization.       If plan is discharge home, recommend the following:  A lot of help with bathing/dressing/bathroom;A lot of help with walking and/or transfers;Assistance with cooking/housework;Assist for transportation;Help with stairs or ramp for entrance   Equipment Recommendations  None recommended by OT (Pt already has needed equipment)    Recommendations for Other Services      Precautions / Restrictions Precautions Precautions: Fall Restrictions Weight Bearing Restrictions Per Provider Order: Yes RLE Weight Bearing Per Provider Order: Weight bearing as  tolerated       Mobility Bed Mobility Overal bed mobility: Needs Assistance Bed Mobility: Sit to Supine       Sit to supine: Min assist, +2 for safety/equipment, HOB elevated, Used rails   General bed mobility comments: pt utilizing gait belt as leg lifter, needing min A to lift RLE into bed, min A +2 to scoot up to [facility] in long sitting/supine    Transfers Overall transfer level: Needs assistance Equipment used: Rolling walker (2 wheels) Transfers: Sit to/from Stand, Bed to chair/wheelchair/BSC Sit to Stand: Min assist, Mod assist, +2 physical assistance, From elevated surface     Step pivot transfers: Min assist, +2 physical assistance (+1 to +2)     General transfer comment: mod A to rise from chair, up to min A +2 with fatigue as pt standing multiple times throughout session from chair x2, BSC x2, good hand placement, increased to time elevate trunk to upright standing     Balance Overall balance assessment: Needs assistance Sitting-balance support: No upper extremity supported, Feet supported Sitting balance-[name]: Fair Sitting balance - Comments: static sitting EOB with supervision for safety   Standing balance support: Single extremity supported, Bilateral upper extremity supported, During functional activity, Reliant on assistive device for balance Standing balance-[name]: Poor Standing balance comment: reliant on RW; able to perform peri-care with Mod-Max assist in standing utilizing RW for unilateral UE support and CGA of therapist for balance                           ADL either performed or assessed with clinical judgement   ADL Overall ADL's : Needs assistance/impaired Eating/Feeding: Independent;Sitting  Grooming: Set up;Sitting   Upper Body Bathing: Set up;Contact guard assist;Sitting   Lower Body Bathing: Moderate assistance;Maximal assistance;Sit to/from stand (using RW for unilateral UE support and with close Supervision to CGA  for balance in standing)   Upper Body Dressing : Set up;Sitting   Lower Body Dressing: Moderate assistance;Maximal assistance;Sit to/from stand (using RW for unilateral UE support and with close Supervision to CGA for balance in standing)   Toilet Transfer: Minimal assistance;BSC/3in1;Rolling walker (2 wheels) (3in1 placed over toilet)   Toileting- Clothing Manipulation and Hygiene: Moderate assistance;Maximal assistance;Sit to/from stand (using RW for unilateral UE support and with close Supervision to CGA for balance in standing)       Functional mobility during ADLs: Minimal assistance;Rolling walker (2 wheels);+2 for safety/equipment (+2 for chair follow for safety) General ADL Comments: Pt requiring increased assistance as session progressed due to fatigue and pain    Extremity/Trunk Assessment Upper Extremity Assessment Upper Extremity Assessment: Right hand dominant;Overall [facility] for tasks assessed   Lower Extremity Assessment Lower Extremity Assessment: Defer to PT evaluation        Vision       Perception     Praxis     Communication Communication Communication: No apparent difficulties   Cognition Arousal: Alert Behavior During Therapy: WFL for tasks assessed/performed Cognition: No apparent impairments             OT - Cognition Comments: Pt AAOx4 and pleasant throughout session. Cognition WFL for tasks assessed                 Following commands: Intact        Cueing   Cueing Techniques: Verbal cues  Exercises      Shoulder Instructions       General Comments Pt educated in pursed lip breathing to assist in pain management with pt demonstrating understanding through teach back. Son present and supportive throughout session. Son reports he, his wife, and his children will be leaving tomorrow to return to [facility] where they live.    Pertinent Vitals/ Pain       Pain Assessment Pain Assessment: Faces Faces Pain Scale: Hurts whole lot Pain  Location: R knee with movement Pain Descriptors / Indicators: Discomfort, Grimacing, Operative site guarding, Moaning Pain Intervention(s): Limited activity within patient's tolerance, Monitored during session, Premedicated before session, Repositioned, Utilized relaxation techniques, Other (comment) (ice machine applied)  Home Living                                          Prior Functioning/Environment              Frequency  Min 2X/week        Progress Toward Goals  OT Goals(current goals can now be found in the care plan section)  Progress towards OT goals: Progressing toward goals  Acute Rehab OT Goals Patient Stated Goal: to have less pain and be more independent  Plan      Co-evaluation    PT/OT/SLP Co-Evaluation/Treatment: Yes Reason for Co-Treatment: For patient/therapist safety;To address functional/ADL transfers PT goals addressed during session: Mobility/safety with mobility;Balance;Proper use of DME OT goals addressed during session: ADL's and self-care      AM-PAC OT 6 Clicks Daily Activity     Outcome Measure   Help from another person eating meals?: None Help from another person taking care of personal grooming?: A Little Help  from another person toileting, which includes using toliet, bedpan, or urinal?: A Lot Help from another person bathing (including washing, rinsing, drying)?: A Lot Help from another person to put on and taking off regular upper body clothing?: A Little Help from another person to put on and taking off regular lower body clothing?: A Lot 6 Click Score: 16    End of Session Equipment Utilized During Treatment: Gait belt;Rolling walker (2 wheels);Other (comment) (ice machine)  OT Visit Diagnosis: Unsteadiness on feet (R26.81);Other abnormalities of gait and mobility (R26.89);Pain   Activity Tolerance Patient limited by pain;Patient tolerated treatment well   Patient Left in bed;with call bell/phone within  reach;with bed alarm set;with family/visitor present   Nurse Communication Mobility status;Other (comment) (OT and PTA concerned regarding plan for pt to discharge home due to no family able to provide physical assist once son and his family leave tomorrow. MD also notified of concerns.)        Time: 8575-8541 OT Time Calculation (min): 34 min  Charges: OT General Charges $OT Visit: 1 Visit OT Treatments $Self Care/Home Management : 8-22 mins  Margarie Rockey HERO., OTR/L, MA Acute Rehab [phone]   Margarie FORBES Horns 12/02/2023, 4:57 PM

## 2023-12-02 NOTE — Progress Notes (Signed)
 Pt discharge has been postpone due to patient not able to progress safety for home at this time. Pt pain is at a constant 7/10 pain. Per Dr. Harden she can stay another day.

## 2023-12-02 NOTE — Progress Notes (Signed)
 Physical Therapy Treatment Patient Details Name: Leah Roberts MRN: [id number] DOB: [birth year] Today's Date: 12/02/2023   History of Present Illness Pt is a 73 y.o. female who presented 11/30/23 for elective R TKA. PMH: arthritis, DVT, GERD, HTN, PVD, pre-diabetes, PE    PT Comments  Pt resting in bed on arrival, agreeable to session, however making slow progress towards acute goals as pt continues to be limited by significant pain with all mobility, despite pre-medication. Pt issued gait belt as leg lifter to progress independence with bed mobility, pt able to self mobilize RLE incrementally to EOB with min A and assist at bed pad to scoot out to EOB to place feet on floor. Pt needing mod A to boost to stand from slightly elevated EOB with increased time needed to elevate trunk. Pt demonstrating short in room with RW for support with frequent standing rest breaks due to pain and min A to maintain balance as well as chair follow for safety. Plan to see pt in PM to continue to progress mobility as tolerated. Pt continues to benefit from skilled PT services to progress toward functional mobility goals.      If plan is discharge home, recommend the following: A little help with walking and/or transfers;A little help with bathing/dressing/bathroom;Assistance with cooking/housework;Assist for transportation;Help with stairs or ramp for entrance   Can travel by private vehicle        Equipment Recommendations  None recommended by PT    Recommendations for Other Services       Precautions / Restrictions Precautions Precautions: Fall Restrictions Weight Bearing Restrictions Per Provider Order: Yes RLE Weight Bearing Per Provider Order: Weight bearing as tolerated     Mobility  Bed Mobility Overal bed mobility: Needs Assistance Bed Mobility: Supine to Sit     Supine to sit: HOB elevated, Used rails, Min assist     General bed mobility comments: issused gait belt for pt to utilize as  leg lifter, pt continues to require assist to lift and mobilize LLE to R EOB, pt needing asssit with bed pad to scoot out to EOB, increased time needed to complete    Transfers Overall transfer level: Needs assistance Equipment used: Rolling walker (2 wheels) Transfers: Sit to/from Stand, Bed to chair/wheelchair/BSC Sit to Stand: Mod assist, From elevated surface           General transfer comment: pt pushing up from EOB with RUE with LUE on RW,  R foot placed anteriorly to L during sit <> stand transfers. increased time to rise and elevate trunk to upright standing, mod A to boost to come to standing    Ambulation/Gait Ambulation/Gait assistance: Min assist (chair follow for safety)   Assistive device: Rolling walker (2 wheels) Gait Pattern/deviations: Step-to pattern, Decreased stance time - right, Decreased weight shift to right, Trunk flexed, Antalgic Gait velocity: reduced     General Gait Details: Pt takes slow, small, antalgic steps, ambulating with a step-to gait pattern. pt needing standing rest every few steps, flexing forward and resting forearms on RW, chair follow for safety and distance limited by fatigue and pain   Stairs             Wheelchair Mobility     Tilt Bed    Modified Rankin (Stroke Patients Only)       Balance Overall balance assessment: Needs assistance Sitting-balance support: No upper extremity supported, Feet supported Sitting balance-[name]: Fair Sitting balance - Comments: static sitting EOB with supervision for safety  Standing balance support: Bilateral upper extremity supported, During functional activity, Reliant on assistive device for balance Standing balance-[name]: Poor Standing balance comment: reliant on RW                            Communication Communication Communication: No apparent difficulties  Cognition Arousal: Alert Behavior During Therapy: WFL for tasks assessed/performed   PT -  Cognitive impairments: No apparent impairments                         Following commands: Intact      Cueing Cueing Techniques: Verbal cues  Exercises      General Comments General comments (skin integrity, edema, etc.): pt reporting that he sister with whom she lives with, recently had back surgery and is unable to assist      Pertinent Vitals/Pain Pain Assessment Pain Assessment: Faces Faces Pain Scale: Hurts whole lot Pain Location: R knee with movement Pain Descriptors / Indicators: Discomfort, Grimacing, Operative site guarding, Moaning Pain Intervention(s): Premedicated before session, Monitored during session, Limited activity within patient's tolerance, Repositioned, Ice applied    Home Living                          Prior Function            PT Goals (current goals can now be found in the care plan section) Acute Rehab PT Goals Patient Stated Goal: to recover well PT Goal Formulation: With patient Time For Goal Achievement: 12/14/23 Progress towards PT goals: Not progressing toward goals - comment (pain)    Frequency    7X/week      PT Plan      Co-evaluation              AM-PAC PT 6 Clicks Mobility   Outcome Measure  Help needed turning from your back to your side while in a flat bed without using bedrails?: A Little Help needed moving from lying on your back to sitting on the side of a flat bed without using bedrails?: A Little Help needed moving to and from a bed to a chair (including a wheelchair)?: A Little Help needed standing up from a chair using your arms (e.g., wheelchair or bedside chair)?: A Little Help needed to walk in hospital room?: Total (<20') Help needed climbing 3-5 steps with a railing? : Total 6 Click Score: 14    End of Session Equipment Utilized During Treatment: Gait belt Activity Tolerance: Patient limited by pain Patient left: in chair;with call bell/phone within reach;Other (comment) (with  iceman applied on R) Nurse Communication: Mobility status (pt needs PM session) PT Visit Diagnosis: Unsteadiness on feet (R26.81);Other abnormalities of gait and mobility (R26.89);Muscle weakness (generalized) (M62.81);Difficulty in walking, not elsewhere classified (R26.2);Pain Pain - Right/Left: Right Pain - part of body: Knee     Time: 0931-1007 PT Time Calculation (min) (ACUTE ONLY): 36 min  Charges:    $Gait Training: 8-22 mins $Therapeutic Activity: 8-22 mins PT General Charges $$ ACUTE PT VISIT: 1 Visit                     Charrise Lardner R. PTA Acute Rehabilitation Services Office: [phone]   [name] 12/02/2023, 11:06 AM

## 2023-12-02 NOTE — TOC Progression Note (Signed)
 Transition of Care [facility]) - Progression Note    Patient Details  Name: Leah Roberts MRN: [id number] Date of Birth: 08/17/1950  [facility] CM/SW Contact  Tymira Horkey LITTIE Moose, LCSW Phone Number: 12/02/2023, 3:08 PM  Clinical Narrative:    CSW spoke with pt and her son about SNF placement. CSW explained that since she does not have insurance it would be private pay which can be expensive. Pt and son both understood and explained that they were not from the [place]  and therefore did not qualify for medicare or medicaid. CSW spoke with PT & OT and decided to print the flyer for [facility] For pt to call and explain the situation and see if she would qualify for their services. CSW provided pt and her son with the flyer, they both said they would call and see if she qualified.   Expected Discharge Plan: Home w Home Health Services Barriers to Discharge: Continued Medical Work up               Expected Discharge Plan and Services   Discharge Planning Services: CM Consult Post Acute Care Choice: Home Health Living arrangements for the past 2 months: Single Family Home Expected Discharge Date: 12/02/23               DME Arranged: N/A DME Agency: NA       HH Arranged: PT HH Agency:  (see note)         Social Drivers of Health (SDOH) Interventions SDOH Screenings   Food Insecurity: No Food Insecurity (11/30/2023)  Housing: Low Risk  (11/30/2023)  Transportation Needs: No Transportation Needs (11/30/2023)  Utilities: Not At Risk (11/30/2023)  Depression (PHQ2-9): Medium Risk (11/23/2023)  Financial Resource Strain: Low Risk  (04/27/2021)   Received from [facility]  Physical Activity: Insufficiently Active (04/27/2021)   Received from [facility]  Social Connections: Unknown (11/30/2023)  Stress: No Stress Concern Present (04/27/2021)   Received from [facility]  Tobacco Use: Low Risk  (11/30/2023)    Readmission Risk Interventions     No data to display

## 2023-12-02 NOTE — Plan of Care (Signed)
  Problem: Activity: Goal: Risk for activity intolerance will decrease Outcome: Not Progressing   Problem: Nutrition: Goal: Adequate nutrition will be maintained Outcome: Not Progressing   Problem: Coping: Goal: Level of anxiety will decrease Outcome: Not Progressing   Problem: Elimination: Goal: Will not experience complications related to urinary retention Outcome: Progressing   Problem: Pain Managment: Goal: General experience of comfort will improve and/or be controlled Outcome: Not Progressing   Problem: Safety: Goal: Ability to remain free from injury will improve Outcome: Not Progressing

## 2023-12-02 NOTE — Progress Notes (Signed)
 Patient ID: Leah Roberts, female   DOB: 06-26-1950, 73 y.o.   MRN: [id number] Patient states she feels much better at this time.  I filled the ice machine with water if the ice machine is not working please obtain a new ice machine for discharge.  Plan for discharge today.

## 2023-12-02 NOTE — Progress Notes (Signed)
 Physical Therapy Treatment Patient Details Name: Leah Roberts MRN: [id number] DOB: [birth year] Today's Date: 12/02/2023   History of Present Illness Pt is a 73 y.o. female who presented 11/30/23 for elective R TKA. PMH: arthritis, DVT, GERD, HTN, PVD, pre-diabetes, PE    PT Comments  Pt up in chair on arrival and agreeable to session. Seen in conjunction with OT to maximize pt activity tolerance and for functional mobility progression. Pt continues to require assist with all aspects of mobility needing up to min A-mod A  +2 for transfers to stand and continued min A during gait with RW for support and chair follow for safety as pt continues to fatigue quickly needing multiple standing rest breaks over 35' of ambulation, leaning anteriorly to rest forearms on RW. Pt needing min A +2 to return to supine at end of session. Pt continues to benefit from skilled PT services to progress toward functional mobility goals.     If plan is discharge home, recommend the following: A little help with walking and/or transfers;A little help with bathing/dressing/bathroom;Assistance with cooking/housework;Assist for transportation;Help with stairs or ramp for entrance   Can travel by private vehicle        Equipment Recommendations  None recommended by PT    Recommendations for Other Services       Precautions / Restrictions Precautions Precautions: Fall Restrictions Weight Bearing Restrictions Per Provider Order: Yes RLE Weight Bearing Per Provider Order: Weight bearing as tolerated     Mobility  Bed Mobility Overal bed mobility: Needs Assistance Bed Mobility: Sit to Supine     Supine to sit: HOB elevated, Used rails, Min assist Sit to supine: HOB elevated, Used rails, Mod assist, +2 for physical assistance   General bed mobility comments: pt utilizing gait belt as leg lifter, needing min A to lift RLE into bed, min A +2 to scoot up to [facility] in long sitting/supine    Transfers Overall  transfer level: Needs assistance Equipment used: Rolling walker (2 wheels) Transfers: Sit to/from Stand, Bed to chair/wheelchair/BSC Sit to Stand: From elevated surface, Mod assist, Min assist, +2 physical assistance           General transfer comment: mod A to rise from chair, up to min A +2 with fatigue as pt standing multiple times throughout session from chair x2, BSC x2, good hand placement, increased to time elevate trunk to upright standing    Ambulation/Gait Ambulation/Gait assistance: Min assist, +2 safety/equipment (chair follow for safety) Gait Distance (Feet): 30 Feet Assistive device: Rolling walker (2 wheels) Gait Pattern/deviations: Step-to pattern, Decreased stance time - right, Decreased weight shift to right, Trunk flexed, Antalgic Gait velocity: reduced     General Gait Details: Pt takes slow, small, antalgic steps, ambulating with a step-to gait pattern. pt needing standing rest every few steps, flexing forward and resting forearms on RW, chair follow for safety and distance limited by fatigue and pain   Stairs             Wheelchair Mobility     Tilt Bed    Modified Rankin (Stroke Patients Only)       Balance Overall balance assessment: Needs assistance Sitting-balance support: No upper extremity supported, Feet supported Sitting balance-[name]: Fair Sitting balance - Comments: static sitting EOB with supervision for safety   Standing balance support: Bilateral upper extremity supported, During functional activity, Reliant on assistive device for balance Standing balance-[name]: Poor Standing balance comment: reliant on RW  Communication Communication Communication: No apparent difficulties  Cognition Arousal: Alert Behavior During Therapy: WFL for tasks assessed/performed   PT - Cognitive impairments: No apparent impairments                         Following commands: Intact       Cueing Cueing Techniques: Verbal cues  Exercises      General Comments General comments (skin integrity, edema, etc.): son present and supportive throughout session, reporting that he is leaving town tomorrow      Pertinent Vitals/Pain Pain Assessment Pain Assessment: Faces Faces Pain Scale: Hurts whole lot Pain Location: R knee with movement Pain Descriptors / Indicators: Discomfort, Grimacing, Operative site guarding, Moaning Pain Intervention(s): Premedicated before session, Monitored during session, Limited activity within patient's tolerance, Repositioned    Home Living                          Prior Function            PT Goals (current goals can now be found in the care plan section) Acute Rehab PT Goals Patient Stated Goal: to recover well PT Goal Formulation: With patient Time For Goal Achievement: 12/14/23 Progress towards PT goals: Progressing toward goals    Frequency    7X/week      PT Plan      Co-evaluation PT/OT/SLP Co-Evaluation/Treatment: Yes Reason for Co-Treatment: For patient/therapist safety;To address functional/ADL transfers PT goals addressed during session: Mobility/safety with mobility;Balance;Proper use of DME        AM-PAC PT 6 Clicks Mobility   Outcome Measure  Help needed turning from your back to your side while in a flat bed without using bedrails?: A Little Help needed moving from lying on your back to sitting on the side of a flat bed without using bedrails?: A Little Help needed moving to and from a bed to a chair (including a wheelchair)?: A Little Help needed standing up from a chair using your arms (e.g., wheelchair or bedside chair)?: A Little Help needed to walk in hospital room?: A Lot (<40') Help needed climbing 3-5 steps with a railing? : Total 6 Click Score: 15    End of Session Equipment Utilized During Treatment: Gait belt Activity Tolerance: Patient limited by pain Patient left: with call  bell/phone within reach;in bed;with family/visitor present Nurse Communication: Mobility status PT Visit Diagnosis: Unsteadiness on feet (R26.81);Other abnormalities of gait and mobility (R26.89);Muscle weakness (generalized) (M62.81);Difficulty in walking, not elsewhere classified (R26.2);Pain Pain - Right/Left: Right Pain - part of body: Knee     Time: 8575-8541 PT Time Calculation (min) (ACUTE ONLY): 34 min  Charges:    $Gait Training: 8-22 mins PT General Charges $$ ACUTE PT VISIT: 1 Visit                     Alexandar Weisenberger R. PTA Acute Rehabilitation Services Office: [phone]   [name] 12/02/2023, 4:15 PM

## 2023-12-02 NOTE — Discharge Summary (Signed)
 Physician Discharge Summary  Patient ID: Leah Roberts MRN: [id number] DOB/AGE: [birth year] 71 y.o.  Admit date: 11/30/2023 Discharge date: 12/02/2023  Admission Diagnoses:  Principal Problem:   Total knee replacement status, right Active Problems:   Unilateral primary osteoarthritis, right knee   Discharge Diagnoses:  Same  Past Medical History:  Diagnosis Date   Arthritis    Complication of anesthesia    Hard to wake up   DVT (deep venous thrombosis) (HCC) 2013   Dyspnea    due to knee pain   GERD (gastroesophageal reflux disease)    History of blood transfusion    History of hypertension    Hypertension    Peripheral vascular disease (HCC)    DVT   Pre-diabetes    Pulmonary embolism (HCC) 2013    Surgeries: Procedure(s): ARTHROPLASTY, KNEE, TOTAL on 11/30/2023   Consultants:   Discharged Condition: Improved  Hospital Course: Leah Roberts is an 73 y.o. female who was admitted 11/30/2023 with a chief complaint of No chief complaint on file. , and found to have a diagnosis of Total knee replacement status, right.  They were brought to the operating room on 11/30/2023 and underwent the above named procedures.    They were given perioperative antibiotics:  Anti-infectives (From admission, onward)    Start     Dose/Rate Route Frequency Ordered Stop   11/30/23 1330  ceFAZolin  (ANCEF ) IVPB 2g/100 mL premix        2 g 200 mL/hr over 30 Minutes Intravenous Every 6 hours 11/30/23 1023 12/01/23 1900   11/30/23 0615  ceFAZolin  (ANCEF ) IVPB 2g/100 mL premix        2 g 200 mL/hr over 30 Minutes Intravenous On call to O.R. 11/30/23 [id number] 11/30/23 0810     .  They were given compression stockings, early ambulation, and chemoprophylaxis for DVT prophylaxis.  They benefited maximally from their hospital stay and there were no complications.    Recent vital signs:  Vitals:   12/02/23 0607 12/02/23 0726  BP: 117/73 121/70  Pulse: (!) 106 98  Resp: 18 16   Temp: 99.2 F (37.3 C) (!) 100.4 F (38 C)  SpO2: 95% 94%    Recent laboratory studies:  Results for orders placed or performed during the hospital encounter of 11/24/23  Surgical pcr screen   Collection Time: 11/24/23  2:02 PM   Specimen: Nasal Mucosa; Nasal Swab  Result Value Ref Range   MRSA, PCR NEGATIVE NEGATIVE   Staphylococcus aureus NEGATIVE NEGATIVE  Basic metabolic panel per protocol   Collection Time: 11/24/23  2:07 PM  Result Value Ref Range   Sodium 141 135 - 145 mmol/L   Potassium 3.6 3.5 - 5.1 mmol/L   Chloride 110 98 - 111 mmol/L   CO2 22 22 - 32 mmol/L   Glucose, Bld 86 70 - 99 mg/dL   BUN 12 8 - 23 mg/dL   Creatinine, Ser 8.97 (H) 0.44 - 1.00 mg/dL   Calcium  9.2 8.9 - 10.3 mg/dL   GFR, Estimated 59 (L) >60 mL/min   Anion gap 9 5 - 15  CBC per protocol   Collection Time: 11/24/23  2:07 PM  Result Value Ref Range   WBC 6.7 4.0 - 10.5 K/uL   RBC 4.59 3.87 - 5.11 MIL/uL   Hemoglobin 12.5 12.0 - 15.0 g/dL   HCT 60.1 63.9 - 53.9 %   MCV 86.7 80.0 - 100.0 fL   MCH 27.2 26.0 - 34.0 pg   MCHC 31.4 30.0 -  36.0 g/dL   RDW 85.6 88.4 - 84.4 %   Platelets 236 150 - 400 K/uL   nRBC 0.0 0.0 - 0.2 %    Discharge Medications:   Allergies as of 12/02/2023       Reactions   Sulfa Antibiotics Swelling, Rash        Medication List     STOP taking these medications    enoxaparin  100 MG/ML injection Commonly known as: LOVENOX        TAKE these medications    acetaminophen  500 MG tablet Commonly known as: TYLENOL  Take 1,000 mg by mouth 2 (two) times daily as needed for moderate pain (pain score 4-6).   ascorbic acid 500 MG tablet Commonly known as: VITAMIN C Take 500 mg by mouth daily as needed (immune support).   Eliquis  5 MG Tabs tablet Generic drug: apixaban  Take 1 tablet (5 mg total) by mouth 2 (two) times daily.   loratadine 10 MG tablet Commonly known as: CLARITIN Take 10 mg by mouth daily as needed for allergies.   Magnesium  Citrate 100  MG Caps Take 100 mg by mouth 3 (three) times a week.   melatonin 5 MG Tabs Take 5 mg by mouth at bedtime as needed (sleep).   oxyCODONE -acetaminophen  5-325 MG tablet Commonly known as: PERCOCET/ROXICET Take 1 tablet by mouth every 4 (four) hours as needed.   rosuvastatin  5 MG tablet Commonly known as: CRESTOR  Take 1 tablet (5 mg total) by mouth at bedtime.   traMADol  50 MG tablet Commonly known as: ULTRAM  Take 50 mg by mouth 2 (two) times daily as needed (pain).   Tums Ultra 1000 1000 MG chewable tablet Generic drug: calcium  elemental as carbonate Chew 2,000 mg by mouth 2 (two) times daily as needed for heartburn.        Diagnostic Studies: No results found.  Disposition: Discharge disposition: 01-Home or Self Care       Discharge Instructions     Call MD / Call 911   Complete by: As directed    If you experience chest pain or shortness of breath, CALL 911 and be transported to the hospital emergency room.  If you develope a fever above 101 F, pus (white drainage) or increased drainage or redness at the wound, or calf pain, call your surgeon's office.   Call MD / Call 911   Complete by: As directed    If you experience chest pain or shortness of breath, CALL 911 and be transported to the hospital emergency room.  If you develope a fever above 101 F, pus (white drainage) or increased drainage or redness at the wound, or calf pain, call your surgeon's office.   Constipation Prevention   Complete by: As directed    Drink plenty of fluids.  Prune juice may be helpful.  You may use a stool softener, such as Colace (over the counter) 100 mg twice a day.  Use MiraLax  (over the counter) for constipation as needed.   Constipation Prevention   Complete by: As directed    Drink plenty of fluids.  Prune juice may be helpful.  You may use a stool softener, such as Colace (over the counter) 100 mg twice a day.  Use MiraLax  (over the counter) for constipation as needed.   Diet - low  sodium heart healthy   Complete by: As directed    Diet - low sodium heart healthy   Complete by: As directed    Increase activity slowly as tolerated  Complete by: As directed    Increase activity slowly as tolerated   Complete by: As directed    Post-operative opioid taper instructions:   Complete by: As directed    POST-OPERATIVE OPIOID TAPER INSTRUCTIONS: It is important to wean off of your opioid medication as soon as possible. If you do not need pain medication after your surgery it is ok to stop day one. Opioids include: Codeine, Hydrocodone (Norco, Vicodin), Oxycodone (Percocet, oxycontin ) and hydromorphone  amongst others.  Long term and even short term use of opiods can cause: Increased pain response Dependence Constipation Depression Respiratory depression And more.  Withdrawal symptoms can include Flu like symptoms Nausea, vomiting And more Techniques to manage these symptoms Hydrate well Eat regular healthy meals Stay active Use relaxation techniques(deep breathing, meditating, yoga) Do Not substitute Alcohol to help with tapering If you have been on opioids for less than two weeks and do not have pain than it is ok to stop all together.  Plan to wean off of opioids This plan should start within one week post op of your joint replacement. Maintain the same interval or time between taking each dose and first decrease the dose.  Cut the total daily intake of opioids by one tablet each day Next start to increase the time between doses. The last dose that should be eliminated is the evening dose.      Post-operative opioid taper instructions:   Complete by: As directed    POST-OPERATIVE OPIOID TAPER INSTRUCTIONS: It is important to wean off of your opioid medication as soon as possible. If you do not need pain medication after your surgery it is ok to stop day one. Opioids include: Codeine, Hydrocodone (Norco, Vicodin), Oxycodone (Percocet, oxycontin ) and  hydromorphone  amongst others.  Long term and even short term use of opiods can cause: Increased pain response Dependence Constipation Depression Respiratory depression And more.  Withdrawal symptoms can include Flu like symptoms Nausea, vomiting And more Techniques to manage these symptoms Hydrate well Eat regular healthy meals Stay active Use relaxation techniques(deep breathing, meditating, yoga) Do Not substitute Alcohol to help with tapering If you have been on opioids for less than two weeks and do not have pain than it is ok to stop all together.  Plan to wean off of opioids This plan should start within one week post op of your joint replacement. Maintain the same interval or time between taking each dose and first decrease the dose.  Cut the total daily intake of opioids by one tablet each day Next start to increase the time between doses. The last dose that should be eliminated is the evening dose.           Follow-up Information     Harden Jerona GAILS, MD Follow up in 1 week(s).   Specialty: Orthopedic Surgery Contact information: [address] [place]  [place] [address] [phone]         Care, [facility] Follow up.   Specialty: Home Health Services Contact information: [address] [place] [address] [phone]                  Signed: Jerona GAILS Harden 12/02/2023, 7:46 AM

## 2023-12-02 NOTE — Progress Notes (Signed)
 Pt has been given instruction on using her IS 10 times every  hour while awake. Pt stated she has used before this nurse came into the room. Pt is resting now, respiration equal and unlabored.

## 2023-12-03 MED ORDER — DIPHENHYDRAMINE HCL 25 MG PO CAPS
25.0000 mg | ORAL_CAPSULE | Freq: Four times a day (QID) | ORAL | Status: DC | PRN
  Administered 2023-12-03: 25 mg via ORAL
  Filled 2023-12-03: qty 1

## 2023-12-03 NOTE — Progress Notes (Signed)
 Physical Therapy Treatment Patient Details Name: Leah Roberts MRN: [id number] DOB: [birth year] Today's Date: 12/03/2023   History of Present Illness Pt is a 73 y.o. female who presented 11/30/23 for elective R TKA. PMH: arthritis, DVT, GERD, HTN, PVD, pre-diabetes, PE    PT Comments  Pt resting in bed on arrival, agreeable to session and with continued slow progress towards acute goals as pt continues to be limited in safe mobility by pain, decreased activity tolerance, and poor balance/postural reactions. Pt able to come to sitting EOB with min A with use of gait belt as leg lifter. Pt continues to require up to mod A and increased time to boost to stand with good recall for safe hand placement on rise. Pt limited in gait distance this session by pain and fatigue, ambulating ~20' with min A to maintain balance and chair follow for safety. Continued education and discussion on ultimate safe mobility at home as pt with limited support and son leaving town today. PA present at end of session and participating in d/c discussion. Patient will benefit from continued inpatient follow up therapy, <3 hours/day pending continued mobility progress. Plan to continue to progress functional mobility as tolerated in PM session. Pt continues to benefit from skilled PT services to progress toward functional mobility goals.     If plan is discharge home, recommend the following: A little help with walking and/or transfers;A little help with bathing/dressing/bathroom;Assistance with cooking/housework;Assist for transportation;Help with stairs or ramp for entrance   Can travel by private vehicle        Equipment Recommendations  None recommended by PT    Recommendations for Other Services       Precautions / Restrictions Precautions Precautions: Fall Restrictions Weight Bearing Restrictions Per Provider Order: Yes RLE Weight Bearing Per Provider Order: Weight bearing as tolerated     Mobility  Bed  Mobility Overal bed mobility: Needs Assistance Bed Mobility: Supine to Sit     Supine to sit: HOB elevated, Used rails, Min assist     General bed mobility comments: pt utilizing gait belt as leg lifter, needing min A to assist to lift and mobilize RLE to EOB and assist to scoot out to EOB, signitifcantly increased time needed to complete    Transfers Overall transfer level: Needs assistance Equipment used: Rolling walker (2 wheels) Transfers: Sit to/from Stand, Bed to chair/wheelchair/BSC Sit to Stand: Mod assist           General transfer comment: mod A to rise from EOB at lowest height, pt needing increased time to decide on optimal hand placement, pushing up from EOB with RUE and LUE on RW. mod A to boost from EOB and increased time to elevate trunk    Ambulation/Gait Ambulation/Gait assistance: Min assist (chair follow for safety) Gait Distance (Feet): 22 Feet Assistive device: Rolling walker (2 wheels) Gait Pattern/deviations: Step-to pattern, Decreased stance time - right, Decreased weight shift to right, Trunk flexed, Antalgic Gait velocity: reduced     General Gait Details: Pt takes slow, small, antalgic steps, ambulating with a step-to gait pattern. pt needing standing rest every few steps, flexing forward and resting forearms on RW, chair follow for safety and distance limited by fatigue and pain   Stairs             Wheelchair Mobility     Tilt Bed    Modified Rankin (Stroke Patients Only)       Balance Overall balance assessment: Needs assistance Sitting-balance support: No upper extremity  supported, Feet supported Sitting balance-[name]: Fair Sitting balance - Comments: static sitting EOB with supervision for safety   Standing balance support: Single extremity supported, Bilateral upper extremity supported, During functional activity, Reliant on assistive device for balance Standing balance-[name]: Poor Standing balance comment:  reliant on RW; able to perform peri-care with Mod-Max assist in standing utilizing RW for unilateral UE support and CGA of therapist for balance                            Communication Communication Communication: No apparent difficulties  Cognition Arousal: Alert Behavior During Therapy: WFL for tasks assessed/performed   PT - Cognitive impairments: No apparent impairments                         Following commands: Intact      Cueing Cueing Techniques: Verbal cues  Exercises Total Joint Exercises Knee Flexion: AAROM, Right, 10 reps, Seated    General Comments        Pertinent Vitals/Pain Pain Assessment Pain Assessment: Faces Faces Pain Scale: Hurts whole lot Pain Location: R knee with movement Pain Descriptors / Indicators: Discomfort, Grimacing, Operative site guarding, Moaning Pain Intervention(s): RN gave pain meds during session, Monitored during session, Limited activity within patient's tolerance    Home Living                          Prior Function            PT Goals (current goals can now be found in the care plan section) Acute Rehab PT Goals Patient Stated Goal: to recover well PT Goal Formulation: With patient Time For Goal Achievement: 12/14/23 Progress towards PT goals: Progressing toward goals    Frequency    7X/week      PT Plan      Co-evaluation              AM-PAC PT 6 Clicks Mobility   Outcome Measure  Help needed turning from your back to your side while in a flat bed without using bedrails?: A Little Help needed moving from lying on your back to sitting on the side of a flat bed without using bedrails?: A Little Help needed moving to and from a bed to a chair (including a wheelchair)?: A Little Help needed standing up from a chair using your arms (e.g., wheelchair or bedside chair)?: A Lot Help needed to walk in hospital room?: A Lot (<40') Help needed climbing 3-5 steps with a railing?  : Total 6 Click Score: 14    End of Session   Activity Tolerance: Patient limited by pain Patient left: with call bell/phone within reach;in chair Nurse Communication: Mobility status PT Visit Diagnosis: Unsteadiness on feet (R26.81);Other abnormalities of gait and mobility (R26.89);Muscle weakness (generalized) (M62.81);Difficulty in walking, not elsewhere classified (R26.2);Pain Pain - Right/Left: Right Pain - part of body: Knee     Time: 9148-9064 PT Time Calculation (min) (ACUTE ONLY): 44 min  Charges:    $Gait Training: 23-37 mins $Therapeutic Activity: 8-22 mins PT General Charges $$ ACUTE PT VISIT: 1 Visit                     Zacory Fiola R. PTA Acute Rehabilitation Services Office: [phone]   [name] 12/03/2023, 10:01 AM

## 2023-12-03 NOTE — Progress Notes (Signed)
 Patient received with purewick - advised would need to have that removed and call staff when she wants to use bedside commode.  Patient very stiff and does not want to move right leg.  Unable to ambulate to bathroom but was able to stand and pivot to bedside commode.  Wants IV pain medication instead of pills - advised will need to try pills first then IV.  Verbalized understanding

## 2023-12-03 NOTE — Progress Notes (Signed)
 Patient is a 73 year old female who is s/p right total knee arthroplasty by Dr. Harden on 11/30/2023.  Having a lot of pain.  Still difficulty with mobilization.  No chest pain, shortness of breath, calf pain that is new for her.  Only was able to walk about 20 to 30 feet with PT before she had to return back.  She lives at home with her sister who is recovering from spine surgery and is very limited in the help that she can provide for the patient.  Her son who took off work to help his mother now has to return to [place]  today.  On exam, postop dressing in place with Coban that does not feel to be too tight.  No calf tenderness.  Negative Homans' sign.  Palpable PT pulse.  Able to perform straight leg raise with the assistance of therapy belt.  Impression is patient with increased postop pain that should preclude her from discharge home today with her decreased ability to mobilize and her lack of social help at home.  We will plan for continued mobilization with PT and potential discharge tomorrow if she really turns a corner but if she still struggling on Monday, may need to consider skilled nursing facility

## 2023-12-03 NOTE — TOC Transition Note (Signed)
 Transition of Care [facility]) - Discharge Note   Patient Details  Name: Leah Roberts MRN: [id number] Date of Birth: May 20, 1950  Transition of Care [facility]) CM/SW Contact:  Robynn Eileen Hoose, RN Phone Number: 12/03/2023, 7:35 AM   Clinical Narrative:     Patient is being discharged today. Cory with [facility] made aware.  Final next level of care: Home w Home Health Services Barriers to Discharge: No Barriers Identified   Patient Goals and CMS Choice Patient states their goals for this hospitalization and ongoing recovery are:: to return to home CMS Medicare.gov Compare Post Acute Care list provided to:: Patient Choice offered to / list presented to : Patient      Discharge Placement                       Discharge Plan and Services Additional resources added to the After Visit Summary for     Discharge Planning Services: CM Consult Post Acute Care Choice: Home Health          DME Arranged: N/A DME Agency: NA       HH Arranged: PT HH Agency:  (see note)        Social Drivers of Health (SDOH) Interventions SDOH Screenings   Food Insecurity: No Food Insecurity (11/30/2023)  Housing: Low Risk  (11/30/2023)  Transportation Needs: No Transportation Needs (11/30/2023)  Utilities: Not At Risk (11/30/2023)  Depression (PHQ2-9): Medium Risk (11/23/2023)  Financial Resource Strain: Low Risk  (04/27/2021)   Received from [facility]  Physical Activity: Insufficiently Active (04/27/2021)   Received from [facility]  Social Connections: Unknown (11/30/2023)  Stress: No Stress Concern Present (04/27/2021)   Received from [facility]  Tobacco Use: Low Risk  (11/30/2023)     Readmission Risk Interventions     No data to display

## 2023-12-03 NOTE — Plan of Care (Signed)
   Problem: Education: Goal: Knowledge of General Education information will improve Description Including pain rating scale, medication(s)/side effects and non-pharmacologic comfort measures Outcome: Progressing

## 2023-12-03 NOTE — Progress Notes (Signed)
 Physical Therapy Treatment Patient Details Name: Leah Roberts MRN: [id number] DOB: 1951-01-22 Today's Date: 12/03/2023   History of Present Illness Pt is a 73 y.o. female who presented 11/30/23 for elective R TKA. PMH: arthritis, DVT, GERD, HTN, PVD, pre-diabetes, PE    PT Comments  Pt up in chair on arrival and agreeable to session with steady progress towards acute goals. Pt progressing gait distance slightly with grossly CGA for safety with continued chair follow as pt fatigues quickly as is limited by pain. Pt able to come to stand from recliner chair with bil UE support on armrests without physical assist this session. Pt declining further exercises at end of session due to pain. Pt up in chair with all needs met and iceman reapplied. Pt continues to benefit from skilled PT services to progress toward functional mobility goals.      If plan is discharge home, recommend the following: A little help with walking and/or transfers;A little help with bathing/dressing/bathroom;Assistance with cooking/housework;Assist for transportation;Help with stairs or ramp for entrance   Can travel by private vehicle        Equipment Recommendations  None recommended by PT    Recommendations for Other Services       Precautions / Restrictions Precautions Precautions: Fall Restrictions Weight Bearing Restrictions Per Provider Order: Yes RLE Weight Bearing Per Provider Order: Weight bearing as tolerated     Mobility  Bed Mobility Overal bed mobility: Needs Assistance Bed Mobility: Supine to Sit     Supine to sit: HOB elevated, Used rails, Min assist     General bed mobility comments: pt up in chair on arrival and at end of session    Transfers Overall transfer level: Needs assistance Equipment used: Rolling walker (2 wheels) Transfers: Sit to/from Stand, Bed to chair/wheelchair/BSC Sit to Stand: Contact guard assist           General transfer comment: with increased time and  bil hands on armrests pt able to rise without physical assist, increased time to bring trunk upright    Ambulation/Gait Ambulation/Gait assistance: Contact guard assist (chair follow for safety) Gait Distance (Feet): 35 Feet Assistive device: Rolling walker (2 wheels) Gait Pattern/deviations: Step-to pattern, Decreased stance time - right, Decreased weight shift to right, Trunk flexed, Antalgic Gait velocity: reduced     General Gait Details: Pt contineus with slow, small, antalgic steps, ambulating with a step-to gait pattern. pt with decreased standing rest breaks this PM, contineus to be limited by pain and faitgue, chair follow for safety   Stairs             Wheelchair Mobility     Tilt Bed    Modified Rankin (Stroke Patients Only)       Balance Overall balance assessment: Needs assistance Sitting-balance support: No upper extremity supported, Feet supported Sitting balance-[name]: Fair Sitting balance - Comments: static sitting EOB with supervision for safety   Standing balance support: Single extremity supported, Bilateral upper extremity supported, During functional activity, Reliant on assistive device for balance Standing balance-[name]: Poor Standing balance comment: reliant on RW; able to perform peri-care with Mod-Max assist in standing utilizing RW for unilateral UE support and CGA of therapist for balance                            Communication Communication Communication: No apparent difficulties  Cognition Arousal: Alert Behavior During Therapy: WFL for tasks assessed/performed   PT - Cognitive impairments:  No apparent impairments                         Following commands: Intact      Cueing Cueing Techniques: Verbal cues  Exercises Total Joint Exercises Knee Flexion: AAROM, Right, 10 reps, Seated    General Comments General comments (skin integrity, edema, etc.): VSS on RA      Pertinent Vitals/Pain  Pain Assessment Pain Assessment: Faces Faces Pain Scale: Hurts even more Pain Location: R knee with movement Pain Descriptors / Indicators: Discomfort, Grimacing, Operative site guarding, Moaning Pain Intervention(s): Monitored during session, Limited activity within patient's tolerance, Ice applied    Home Living                          Prior Function            PT Goals (current goals can now be found in the care plan section) Acute Rehab PT Goals Patient Stated Goal: to recover well PT Goal Formulation: With patient Time For Goal Achievement: 12/14/23 Progress towards PT goals: Progressing toward goals    Frequency    7X/week      PT Plan      Co-evaluation              AM-PAC PT 6 Clicks Mobility   Outcome Measure  Help needed turning from your back to your side while in a flat bed without using bedrails?: A Little Help needed moving from lying on your back to sitting on the side of a flat bed without using bedrails?: A Little Help needed moving to and from a bed to a chair (including a wheelchair)?: A Little Help needed standing up from a chair using your arms (e.g., wheelchair or bedside chair)?: A Lot Help needed to walk in hospital room?: A Lot (<40') Help needed climbing 3-5 steps with a railing? : Total 6 Click Score: 14    End of Session   Activity Tolerance: Patient limited by pain Patient left: with call bell/phone within reach;in chair Nurse Communication: Mobility status PT Visit Diagnosis: Unsteadiness on feet (R26.81);Other abnormalities of gait and mobility (R26.89);Muscle weakness (generalized) (M62.81);Difficulty in walking, not elsewhere classified (R26.2);Pain Pain - Right/Left: Right Pain - part of body: Knee     Time: 8481-8468 PT Time Calculation (min) (ACUTE ONLY): 13 min  Charges:    $Gait Training: 8-22 mins PT General Charges $$ ACUTE PT VISIT: 1 Visit                     Kinisha Soper R. PTA Acute Rehabilitation  Services Office: [phone]   [name] 12/03/2023, 4:00 PM

## 2023-12-03 NOTE — Plan of Care (Signed)

## 2023-12-03 NOTE — Progress Notes (Signed)
 Patient suspects that dilaudid  is making her itchy. Attempted to notify ortho with the help of the charge nurse to inform MD. Patient complains of generalized body itch with no difficulty of breathing.

## 2023-12-04 DIAGNOSIS — Z96651 Presence of right artificial knee joint: Secondary | ICD-10-CM

## 2023-12-04 NOTE — Progress Notes (Signed)
 Occupational Therapy Treatment Patient Details Name: Leah Roberts MRN: [id number] DOB: [birth year] Today's Date: 12/04/2023   History of present illness Pt is a 73 y.o. female who presented 11/30/23 for elective R TKA. PMH: arthritis, DVT, GERD, HTN, PVD, pre-diabetes, PE   OT comments  Patient motivated towards therapy and willing to participate in spite of pain. Patient asking for pain meds and nursing notified. Patient asking to perform self care tasks at bed level while waiting for pain meds. Patient's orthopedic surgeon arrived during session and changed bandages on knee with COTA assisting and patient demonstrating pain when lifted off bed. Patient decided to attempt OOB once dressing was changed and required max assist and patient was unable to complete due to pain and returned to supine. Education provided on AE use for LB dressing, patient would benefit from further education to increase safety with LB dressing. Acute OT to continue to follow to address established goals to facilitate DC to next venue of care.        If plan is discharge home, recommend the following:  A lot of help with bathing/dressing/bathroom;A lot of help with walking and/or transfers;Assistance with cooking/housework;Assist for transportation;Help with stairs or ramp for entrance   Equipment Recommendations  None recommended by OT (Pt already has needed equipment)    Recommendations for Other Services      Precautions / Restrictions Precautions Precautions: Knee;Fall Precaution Booklet Issued: Yes (comment) Recall of Precautions/Restrictions: Intact Restrictions Weight Bearing Restrictions Per Provider Order: Yes RLE Weight Bearing Per Provider Order: Weight bearing as tolerated       Mobility Bed Mobility Overal bed mobility: Needs Assistance Bed Mobility: Supine to Sit     Supine to sit: HOB elevated, Used rails, Max assist     General bed mobility comments: attempted EOB with patient  unable to complete due to 10/10 pain    Transfers                   General transfer comment: not attempted     Balance                                           ADL either performed or assessed with clinical judgement   ADL Overall ADL's : Needs assistance/impaired     Grooming: Wash/dry face;Wash/dry hands;Oral care;Set up;Bed level   Upper Body Bathing: Set up;Bed level   Lower Body Bathing: Minimal assistance;Bed level   Upper Body Dressing : Set up;Bed level     Lower Body Dressing Details (indicate cue type and reason): education provided on AE use for LB dressing               General ADL Comments: Patient performed self care tasks from bed level while waiting for pain meds.    Extremity/Trunk Assessment              Vision       Restaurant manager, fast food Communication: No apparent difficulties   Cognition Arousal: Alert Behavior During Therapy: WFL for tasks assessed/performed                                 Following commands: Intact        Cueing   Cueing Techniques: Verbal cues  Exercises  Shoulder Instructions       General Comments Orthopedic arrived during session and performed dressing change to RLE with COTA assisting    Pertinent Vitals/ Pain       Pain Assessment Pain Assessment: 0-10 Pain Score: 10-Worst pain ever Pain Location: R knee and R hip with movement Pain Descriptors / Indicators: Grimacing, Guarding, Operative site guarding, Discomfort, Sharp Pain Intervention(s): Limited activity within patient's tolerance, Monitored during session, Repositioned, Patient requesting pain meds-RN notified  Home Living                                          Prior Functioning/Environment              Frequency  Min 2X/week        Progress Toward Goals  OT Goals(current goals can now be found in the care plan section)   Progress towards OT goals: Not progressing toward goals - comment (limited progress due to pain)  Acute Rehab OT Goals Patient Stated Goal: less pain OT Goal Formulation: With patient Time For Goal Achievement: 12/15/23 Potential to Achieve Goals: Good ADL Goals Pt Will Perform Lower Body Bathing: with min assist;sitting/lateral leans;sit to/from stand (with adaptive equipment as needed) Pt Will Perform Lower Body Dressing: with min assist;sitting/lateral leans;sit to/from stand (with adaptive equipment as needed) Pt Will Transfer to Toilet: with contact guard assist;ambulating;regular height toilet (with least restrictive AD) Pt Will Perform Toileting - Clothing Manipulation and hygiene: with supervision;sitting/lateral leans;sit to/from stand Additional ADL Goal #1: Patient will demonstrate understanding through teach back of education in use of breathing, meditation, mindfulness, and sensory-based techniques for pain management.  Plan      Co-evaluation                 AM-PAC OT 6 Clicks Daily Activity     Outcome Measure   Help from another person eating meals?: None Help from another person taking care of personal grooming?: A Little Help from another person toileting, which includes using toliet, bedpan, or urinal?: A Lot Help from another person bathing (including washing, rinsing, drying)?: A Lot Help from another person to put on and taking off regular upper body clothing?: A Little Help from another person to put on and taking off regular lower body clothing?: A Lot 6 Click Score: 16    End of Session    OT Visit Diagnosis: Unsteadiness on feet (R26.81);Other abnormalities of gait and mobility (R26.89);Pain   Activity Tolerance Patient limited by pain   Patient Left in bed;with call bell/phone within reach;with bed alarm set   Nurse Communication Mobility status;Patient requests pain meds        Time: 0730-0808 OT Time Calculation (min): 38  min  Charges: OT General Charges $OT Visit: 1 Visit OT Treatments $Self Care/Home Management : 23-37 mins $Therapeutic Activity: 8-22 mins  Dick Laine, OTA Acute Rehabilitation Services  Office [phone]   Jeb LITTIE Laine 12/04/2023, 12:17 PM

## 2023-12-04 NOTE — Progress Notes (Signed)
 Physical Therapy Treatment Patient Details Name: Leah Roberts MRN: [id number] DOB: March 25, 1951 Today's Date: 12/04/2023   History of Present Illness Pt is a 73 y.o. female who presented 11/30/23 for elective R TKA. PMH: arthritis, DVT, GERD, HTN, PVD, pre-diabetes, PE.    PT Comments  Pt greeted supine in bed, pleasant and agreeable to PT session. She continues to be limited by pain and decreased activity tolerance. Pt takes increased time to complete all functional mobility and requires minA to manage RLE. She engaged in transfers using RW with minA. Pt demonstrated decreased gait distance, self-limited RLE weight bearing, and displayed minimal hip/knee flexion. She requires a close chair follow for safety and took frequent standing rest breaks. Given pt's CLOF, home set-up, limited family support, and moderate fall risk may need to consider continued inpatient follow up therapy, <3 hours/day.     If plan is discharge home, recommend the following: Two people to help with walking and/or transfers;A lot of help with bathing/dressing/bathroom;Assistance with cooking/housework;Assist for transportation;Help with stairs or ramp for entrance   Can travel by private vehicle        Equipment Recommendations  Wheelchair (measurements PT)    Recommendations for Other Services       Precautions / Restrictions Precautions Precautions: Knee;Fall Precaution Booklet Issued: Yes (comment) Recall of Precautions/Restrictions: Intact Restrictions Weight Bearing Restrictions Per Provider Order: Yes RLE Weight Bearing Per Provider Order: Weight bearing as tolerated     Mobility  Bed Mobility Overal bed mobility: Needs Assistance Bed Mobility: Supine to Sit     Supine to sit: HOB elevated, Used rails, Min assist     General bed mobility comments: Pt sat up on L side of bed with increased time. Assist to position gait belt around R foot and aid in bringing RLE off EOB. Cues for sequencing. Pt  able to scoot fwd with increased time.    Transfers Overall transfer level: Needs assistance Equipment used: Rolling walker (2 wheels) Transfers: Sit to/from Stand, Bed to chair/wheelchair/BSC Sit to Stand: From elevated surface, Min assist   Step pivot transfers: Contact guard assist, +2 safety/equipment       General transfer comment: Pt stood from raised bed height. Assist to position RLE. Cues for proper hand placement using RW. Pt pushed up with BUE support from bed. Powered up with minA and increased time to bring BUE support onto RW. Pt initially maintaining a fwd lean with forearm support on RW. Able to obtain erect posture. Transferred to recliner chair on left. Good eccentric control.    Ambulation/Gait Ambulation/Gait assistance: Contact guard assist, +2 safety/equipment (chair follow) Gait Distance (Feet): 10 Feet Assistive device: Rolling walker (2 wheels) Gait Pattern/deviations: Step-to pattern, Decreased step length - right, Decreased step length - left, Decreased stance time - right, Decreased weight shift to right, Antalgic, Trunk flexed Gait velocity: decreased     General Gait Details: Pt ambulated with short, slow, laborious steps. Heavy reliance on BUE support on RW. She took frequent rest breaks and would often increase fwd lean and place forearms on RW. Cued PLB technique and emphasized maintaining upright posture. Pt kept RLE infront while ambulating. She self-limited RLE WBing and demonstrated minimal knee extension.   Stairs             Wheelchair Mobility     Tilt Bed    Modified Rankin (Stroke Patients Only)       Balance Overall balance assessment: Needs assistance Sitting-balance support: Single extremity supported, Feet supported Sitting balance-Leahy  Scale: Fair Sitting balance - Comments: Pt sat EOB with supervision. She demonstrated a left lateral lean to offweight right side d/t pain. Postural control: Left lateral lean Standing  balance support: Bilateral upper extremity supported, During functional activity, Reliant on assistive device for balance Standing balance-[name]: Poor Standing balance comment: Pt dependent on RW and external support for stability.                            Communication Communication Communication: No apparent difficulties  Cognition Arousal: Alert Behavior During Therapy: WFL for tasks assessed/performed   PT - Cognitive impairments: No apparent impairments                         Following commands: Intact      Cueing Cueing Techniques: Verbal cues  Exercises      General Comments        Pertinent Vitals/Pain Pain Assessment Pain Assessment: 0-10 Pain Score: 10-Worst pain ever Pain Location: R knee and R hip Pain Descriptors / Indicators: Grimacing, Guarding, Operative site guarding, Discomfort, Sharp Pain Intervention(s): Premedicated before session, Limited activity within patient's tolerance, Monitored during session, Repositioned, Ice applied    Home Living                          Prior Function            PT Goals (current goals can now be found in the care plan section) Acute Rehab PT Goals Patient Stated Goal: Have less pain and move better Progress towards PT goals: Progressing toward goals    Frequency    7X/week      PT Plan      Co-evaluation              AM-PAC PT 6 Clicks Mobility   Outcome Measure  Help needed turning from your back to your side while in a flat bed without using bedrails?: A Little Help needed moving from lying on your back to sitting on the side of a flat bed without using bedrails?: A Little Help needed moving to and from a bed to a chair (including a wheelchair)?: A Little Help needed standing up from a chair using your arms (e.g., wheelchair or bedside chair)?: A Little Help needed to walk in hospital room?: Total Help needed climbing 3-5 steps with a railing? :  Total 6 Click Score: 14    End of Session Equipment Utilized During Treatment: Gait belt Activity Tolerance: Patient limited by pain Patient left: in chair;with call bell/phone within reach;with chair alarm set Nurse Communication: Mobility status PT Visit Diagnosis: Unsteadiness on feet (R26.81);Other abnormalities of gait and mobility (R26.89);Muscle weakness (generalized) (M62.81);Difficulty in walking, not elsewhere classified (R26.2);Pain Pain - Right/Left: Right Pain - part of body: Knee     Time: 0906-0929 PT Time Calculation (min) (ACUTE ONLY): 23 min  Charges:    $Gait Training: 8-22 mins $Therapeutic Activity: 8-22 mins PT General Charges $$ ACUTE PT VISIT: 1 Visit                     Randall SAUNDERS, PT, DPT Acute Rehabilitation Services Office: [phone] Secure Chat Preferred  Delon CHRISTELLA Callander 12/04/2023, 11:12 AM

## 2023-12-04 NOTE — Progress Notes (Signed)
  Subjective: Patient stable.  Having a lot of pain in the right knee.   Objective: Vital signs in last 24 hours: Temp:  [98.3 F (36.8 C)-98.6 F (37 C)] 98.6 F (37 C) (08/03 0419) Pulse Rate:  [98-101] 101 (08/03 0419) Resp:  [16] 16 (08/03 0419) BP: (100-126)/(61-83) 112/61 (08/03 0419) SpO2:  [98 %-100 %] 98 % (08/03 0419)  Intake/Output from previous day: 08/02 0701 - 08/03 0700 In: 240 [P.O.:240] Out: -  Intake/Output this shift: No intake/output data recorded.  Exam:  Sensation intact distally Dorsiflexion/Plantar flexion intact No cellulitis present Compartment soft  Labs: No results for input(s): HGB in the last 72 hours. No results for input(s): WBC, RBC, HCT, PLT in the last 72 hours. No results for input(s): NA, K, CL, CO2, BUN, CREATININE, GLUCOSE, CALCIUM  in the last 72 hours. No results for input(s): LABPT, INR in the last 72 hours.  Assessment/Plan: Plan at this time is to change the Coban dressing out to Aquacel and Ace wrap.  Did clean the incision before putting on the Aquacel.  She may benefit from CPM since she is very  reluctant to move with physical therapy.  May need placement based on lack of mobilization   G Glendia Hutchinson 12/04/2023, 8:02 AM

## 2023-12-04 NOTE — Progress Notes (Signed)
 Physical Therapy Treatment Patient Details Name: Leah Roberts MRN: [id number] DOB: 03-Jun-1950 Today's Date: 12/04/2023   History of Present Illness Pt is a 73 y.o. female who presented 11/30/23 for elective R TKA. PMH: arthritis, DVT, GERD, HTN, PVD, pre-diabetes, PE.    PT Comments  Pt greeted seated in recliner chair. She is motivated and agreeable to participate in PT session in spite of pain. Pt requested pain medicine, RN notified and entered during session to provided medication. Pt is making slow progress towards acute PT goals. She required increased physical assist this session. Pt completed sit<>stand with modA x2 using RW. She ambulated within the room with minA x2 using RW. Pt demonstrated multiple gait abnormalities and is unable to adjust despite multi-modal cueing. She quickly fatigues and will take frequent breaks increasing forward trunk lean and bracing forearms on RW. Cues for increased safety awareness and improved technique. Reviewed pt's R TKA HEP. She engaged in seated LE exercises. Will continue to follow acutely and advance appropriately.     If plan is discharge home, recommend the following: Two people to help with walking and/or transfers;A lot of help with bathing/dressing/bathroom;Assistance with cooking/housework;Assist for transportation;Help with stairs or ramp for entrance   Can travel by private vehicle        Equipment Recommendations  Wheelchair (measurements PT)    Recommendations for Other Services       Precautions / Restrictions Precautions Precautions: Knee;Fall Precaution Booklet Issued: Yes (comment) Recall of Precautions/Restrictions: Intact Restrictions Weight Bearing Restrictions Per Provider Order: Yes RLE Weight Bearing Per Provider Order: Weight bearing as tolerated     Mobility  Bed Mobility               General bed mobility comments: Not assessed. Pt greeted seated in recliner chair and returned there at end of  session.    Transfers Overall transfer level: Needs assistance Equipment used: Rolling walker (2 wheels) Transfers: Sit to/from Stand Sit to Stand: Mod assist, +2 physical assistance           General transfer comment: Pt stood from recliner chair. Cues to scoot fwd to edge of chair. Assist to reposition RLE with increased knee flex. Pt opted to push up with BUE support from chair. Powered up with modA x2. She took increased time to transition hands onto RW grips and required modA to stabilize as she accepted weight onto RLE. Good eccentric control.    Ambulation/Gait Ambulation/Gait assistance: Min assist, +2 safety/equipment (chair follow) Gait Distance (Feet): 15 Feet Assistive device: Rolling walker (2 wheels) Gait Pattern/deviations: Step-to pattern, Decreased step length - right, Decreased step length - left, Decreased stance time - right, Decreased weight shift to right, Antalgic, Trunk flexed Gait velocity: decreased     General Gait Details: Pt ambulated by holding RLE in front of her and advancing RW followed by LLE. Heavy dependence on RW. She self-limited WBing on RLE d/t pain. Pt took standing rest breaks every couple of steps and would opt to rest forearms on RW and increase fwd flex. Cues for improved safety awarness.   Stairs             Wheelchair Mobility     Tilt Bed    Modified Rankin (Stroke Patients Only)       Balance Overall balance assessment: Needs assistance Sitting-balance support: Single extremity supported, Feet supported Sitting balance-[name]: Fair     Standing balance support: Bilateral upper extremity supported, During functional activity, Reliant on assistive device for  balance Standing balance-[name]: Poor Standing balance comment: Pt dependent on RW                            Communication Communication Communication: No apparent difficulties  Cognition Arousal: Alert Behavior During Therapy: WFL for  tasks assessed/performed   PT - Cognitive impairments: No apparent impairments                         Following commands: Intact      Cueing Cueing Techniques: Verbal cues, Gestural cues, Tactile cues  Exercises Total Joint Exercises Ankle Circles/Pumps: Seated, Both, 15 reps, AROM Long Arc Quad: Seated, Right, 10 reps, AAROM Knee Flexion: Seated, Right, AAROM, 10 reps    General Comments General comments (skin integrity, edema, etc.): VSS on RA. Reviewed R TKA HEP with pt and emphasized the importance of completing 2-3 sets each day.      Pertinent Vitals/Pain Pain Assessment Pain Assessment: 0-10 Pain Score: 10-Worst pain ever Pain Location: R hip and R knee Pain Descriptors / Indicators: Grimacing, Guarding, Operative site guarding, Discomfort, Sharp Pain Intervention(s): Monitored during session, Limited activity within patient's tolerance, RN gave pain meds during session, Repositioned, Ice applied    Home Living                          Prior Function            PT Goals (current goals can now be found in the care plan section) Acute Rehab PT Goals Patient Stated Goal: Stop hurting like this and tolerate moving Progress towards PT goals: Progressing toward goals    Frequency    7X/week      PT Plan      Co-evaluation              AM-PAC PT 6 Clicks Mobility   Outcome Measure  Help needed turning from your back to your side while in a flat bed without using bedrails?: A Little Help needed moving from lying on your back to sitting on the side of a flat bed without using bedrails?: A Little Help needed moving to and from a bed to a chair (including a wheelchair)?: Total Help needed standing up from a chair using your arms (e.g., wheelchair or bedside chair)?: Total Help needed to walk in hospital room?: Total Help needed climbing 3-5 steps with a railing? : Total 6 Click Score: 10    End of Session Equipment Utilized  During Treatment: Gait belt Activity Tolerance: Patient limited by pain Patient left: in chair;with call bell/phone within reach;with chair alarm set Nurse Communication: Mobility status PT Visit Diagnosis: Unsteadiness on feet (R26.81);Other abnormalities of gait and mobility (R26.89);Muscle weakness (generalized) (M62.81);Difficulty in walking, not elsewhere classified (R26.2);Pain Pain - Right/Left: Right Pain - part of body: Knee     Time: 8463-8397 PT Time Calculation (min) (ACUTE ONLY): 26 min  Charges:    $Gait Training: 8-22 mins $Therapeutic Exercise: 8-22 mins PT General Charges $$ ACUTE PT VISIT: 1 Visit                     Randall SAUNDERS, PT, DPT Acute Rehabilitation Services Office: [phone] Secure Chat Preferred  Delon CHRISTELLA Callander 12/04/2023, 5:14 PM

## 2023-12-05 MED ORDER — MAGNESIUM CITRATE PO SOLN
1.0000 | Freq: Once | ORAL | Status: AC
  Administered 2023-12-05: 1 via ORAL
  Filled 2023-12-05: qty 296

## 2023-12-05 NOTE — Progress Notes (Signed)
 Assisted patient to bedpan as she is refusing to go to bedside commode and was requesting for a purewick, educated patient that purewick is only used pre-operatively and getting out of bed to urinate is encouraged post-operatively. Patient also has not had any bowel movement  since 07/30, miralax  administered last night but has no effect yet. Will have suppository after breakfast.

## 2023-12-05 NOTE — Progress Notes (Signed)
 Occupational Therapy Treatment Patient Details Name: Leah Roberts MRN: [id number] DOB: 1950/06/02 Today's Date: 12/05/2023   History of present illness Pt is a 73 y.o. female who presented 11/30/23 for elective R TKA. PMH: arthritis, DVT, GERD, HTN, PVD, pre-diabetes, PE   OT comments  Patient with PT in bathroom upon entry. Patient performed transfer to toilet with 3n1 over commode with RW and use of grab bar with min assist. Patient performed bathing and UB dressing from 3n1 with setup for UB and mod assist for LB bathing with CGA while standing for toilet hygiene and peri area bathing. AE training for LB dressing with patient requiring mod assist due to pain. Discharge recommendations continue to be appropriate.      If plan is discharge home, recommend the following:  A lot of help with bathing/dressing/bathroom;A lot of help with walking and/or transfers;Assistance with cooking/housework;Assist for transportation;Help with stairs or ramp for entrance   Equipment Recommendations  None recommended by OT (Pt already has needed equipment)    Recommendations for Other Services      Precautions / Restrictions Precautions Precautions: Knee;Fall Precaution Booklet Issued: Yes (comment) Recall of Precautions/Restrictions: Intact Restrictions Weight Bearing Restrictions Per Provider Order: Yes RLE Weight Bearing Per Provider Order: Weight bearing as tolerated       Mobility Bed Mobility Overal bed mobility: Needs Assistance             General bed mobility comments: OOB with PT upon entry and left in recliner    Transfers Overall transfer level: Needs assistance Equipment used: Rolling walker (2 wheels) Transfers: Sit to/from Stand Sit to Stand: Min assist     Step pivot transfers: Min assist     General transfer comment: cues for hand placement and UE use to assist advancing RLE     Balance Overall balance assessment: Needs assistance Sitting-balance support:  Single extremity supported, Feet supported Sitting balance-[name]: Fair     Standing balance support: Single extremity supported, Bilateral upper extremity supported, During functional activity Standing balance-[name]: Poor Standing balance comment: able to stand with one extremity support during toilet hygiene with use of grab bar for support                           ADL either performed or assessed with clinical judgement   ADL Overall ADL's : Needs assistance/impaired     Grooming: Wash/dry hands;Wash/dry face;Set up;Sitting   Upper Body Bathing: Set up;Sitting   Lower Body Bathing: Moderate assistance Lower Body Bathing Details (indicate cue type and reason): CGA while standing for peri area bathing and assistance for right foot Upper Body Dressing : Set up;Sitting   Lower Body Dressing: Maximal assistance;With adaptive equipment Lower Body Dressing Details (indicate cue type and reason): mod assist for LB dressing with AE due to pain Toilet Transfer: Minimal assistance;BSC/3in1;Rolling walker (2 wheels);Grab bars (3n1 over toilet) Toilet Transfer Details (indicate cue type and reason): instructions to use UEs to advance RLE Toileting- Clothing Manipulation and Hygiene: Contact guard assist;Sit to/from stand Toileting - Clothing Manipulation Details (indicate cue type and reason): patient uses grab bar for stability and CGA for safety     Functional mobility during ADLs: Minimal assistance;Rolling walker (2 wheels) General ADL Comments: limited due to pain    Extremity/Trunk Assessment              Vision       Perception     Praxis  Communication Communication Communication: No apparent difficulties   Cognition Arousal: Alert Behavior During Therapy: WFL for tasks assessed/performed                                 Following commands: Intact        Cueing   Cueing Techniques: Verbal cues, Gestural cues, Tactile  cues  Exercises      Shoulder Instructions       General Comments AE training performed for LB dressing with difficulty to use due to pain    Pertinent Vitals/ Pain       Pain Assessment Pain Assessment: Faces Faces Pain Scale: Hurts whole lot Pain Location: R hip and R knee Pain Descriptors / Indicators: Grimacing, Guarding, Operative site guarding, Discomfort, Sharp Pain Intervention(s): Limited activity within patient's tolerance, Monitored during session, Premedicated before session, Repositioned  Home Living                                          Prior Functioning/Environment              Frequency  Min 2X/week        Progress Toward Goals  OT Goals(current goals can now be found in the care plan section)  Progress towards OT goals: Progressing toward goals  Acute Rehab OT Goals Patient Stated Goal: less pain OT Goal Formulation: With patient Time For Goal Achievement: 12/15/23 Potential to Achieve Goals: Good ADL Goals Pt Will Perform Lower Body Bathing: with min assist;sitting/lateral leans;sit to/from stand (with adaptive equipment as needed) Pt Will Perform Lower Body Dressing: with min assist;sitting/lateral leans;sit to/from stand (with adaptive equipment as needed) Pt Will Transfer to Toilet: with contact guard assist;ambulating;regular height toilet (with least restrictive AD) Pt Will Perform Toileting - Clothing Manipulation and hygiene: with supervision;sitting/lateral leans;sit to/from stand Additional ADL Goal #1: Patient will demonstrate understanding through teach back of education in use of breathing, meditation, mindfulness, and sensory-based techniques for pain management.  Plan      Co-evaluation                 AM-PAC OT 6 Clicks Daily Activity     Outcome Measure   Help from another person eating meals?: None Help from another person taking care of personal grooming?: A Little Help from another person  toileting, which includes using toliet, bedpan, or urinal?: A Little Help from another person bathing (including washing, rinsing, drying)?: A Lot Help from another person to put on and taking off regular upper body clothing?: A Little Help from another person to put on and taking off regular lower body clothing?: A Lot 6 Click Score: 17    End of Session Equipment Utilized During Treatment: Gait belt;Rolling walker (2 wheels)  OT Visit Diagnosis: Unsteadiness on feet (R26.81);Other abnormalities of gait and mobility (R26.89);Pain Pain - Right/Left: Right Pain - part of body: Leg   Activity Tolerance Patient limited by pain   Patient Left in chair;with call bell/phone within reach;with chair alarm set   Nurse Communication Mobility status;Patient requests pain meds        Time: 0924-0959 OT Time Calculation (min): 35 min  Charges: OT General Charges $OT Visit: 1 Visit OT Treatments $Self Care/Home Management : 23-37 mins  Dick Laine, OTA Acute Rehabilitation Services  Office [phone]   Jeb LITTIE Laine  12/05/2023, 10:20 AM

## 2023-12-05 NOTE — TOC Progression Note (Addendum)
 Transition of Care (TOC) - Progression Note   Plan discharge today after PT. Received message sister cannot transport her home so she will need ambulance.   Patient does not have insurance. Spoke to [employer], was advised to give base rate to patient and make sure she is aware she will receive a bill. Same done. Patient called her son who is in [facility] [place]. NCM gave son base rate and explained above.    He can arrange an uber. She wants him to call friends and family to help her get out of the uber once she arrives home. Son asked patient if she could go by [place] , she stated I think so   He wants to know if she can go by [place] and needs an extra time of discharge .   NCM message PT   PT said it will not be easy for patient to get in and out of an uber. Hospital staff will need to assist her in the uber and she will need two people at home to assist her out of the [place] . OT in agreement and recommends pain medication prior to trying to get in the uber. OT also recommends a SUV would be best.   PT plans to see patient 1315 and be finished at 1345. NCM waiting for bedside nurse to respond , regarding what time discharge will be completed. NCM will speak with patient again and ask her to call son again and will discuss above .   PT/OT do not feel that transport by car is safe.   NCM spoke to patient and discussed PT/OT does not feel transport by care is safe. If she decides to go home by car , SUV would be better and she will need at least two people to assist her inside. Patient voiced understanding and states she has someone coming with a SUV to pick her up at 5 pm and she will have two people at home to assist her inside. NCM asked to call her son. Patient declined stating son is aware. NCM secure chatted team. NCM will leave PTAR paperwork in chart just in case   Updated [facility] with [facility]  Patient Details  Name: Leah Roberts MRN: [id number] Date of Birth: [birth year]  Transition of Care  [facility]) CM/SW Contact  Dione Petron, Powell Jansky, RN Phone Number: 12/05/2023, 12:16 PM  Clinical Narrative:       Expected Discharge Plan: Home w Home Health Services Barriers to Discharge: No Barriers Identified               Expected Discharge Plan and Services   Discharge Planning Services: CM Consult Post Acute Care Choice: Home Health Living arrangements for the past 2 months: Single Family Home Expected Discharge Date: 12/05/23               DME Arranged: N/A DME Agency: NA       HH Arranged: PT HH Agency:  (see note)         Social Drivers of Health (SDOH) Interventions SDOH Screenings   Food Insecurity: No Food Insecurity (11/30/2023)  Housing: Low Risk  (11/30/2023)  Transportation Needs: No Transportation Needs (11/30/2023)  Utilities: Not At Risk (11/30/2023)  Depression (PHQ2-9): Medium Risk (11/23/2023)  Financial Resource Strain: Low Risk  (04/27/2021)   Received from [facility]  Physical Activity: Insufficiently Active (04/27/2021)   Received from [facility]  Social Connections: Unknown (11/30/2023)  Stress: No Stress Concern Present (04/27/2021)   Received from [facility]  Tobacco Use:  Low Risk  (11/30/2023)    Readmission Risk Interventions     No data to display

## 2023-12-05 NOTE — Progress Notes (Signed)
 Physical Therapy Treatment Patient Details Name: Leah Roberts MRN: [id number] DOB: Aug 24, 1950 Today's Date: 12/05/2023   History of Present Illness Pt is a 73 y.o. female who presented 11/30/23 for elective R TKA. PMH: arthritis, DVT, GERD, HTN, PVD, pre-diabetes, PE    PT Comments  Pt resting in bed on arrival, agreeable to session and with continued slow but steady progress towards acute goals as pt continues to be limited by pain and decreased activity tolerance. Pt continues to require increased assist to manage RLE to and out of bed. Discussed and recommended pt sleep up in recliner once home as pt without physical assist. Pt demonstrating gait for household distance with RW for support and CGA for safety with continued chair follow as pt quick to fatigue. Pt up in bathroom with COTA at end fo session. Pt continues to benefit from skilled PT services to progress toward functional mobility goals.     If plan is discharge home, recommend the following: Two people to help with walking and/or transfers;A lot of help with bathing/dressing/bathroom;Assistance with cooking/housework;Assist for transportation;Help with stairs or ramp for entrance   Can travel by private vehicle        Equipment Recommendations  Wheelchair (measurements PT)    Recommendations for Other Services       Precautions / Restrictions Precautions Precautions: Knee;Fall Precaution Booklet Issued: Yes (comment) Recall of Precautions/Restrictions: Intact Restrictions Weight Bearing Restrictions Per Provider Order: Yes RLE Weight Bearing Per Provider Order: Weight bearing as tolerated     Mobility  Bed Mobility Overal bed mobility: Needs Assistance Bed Mobility: Supine to Sit     Supine to sit: HOB elevated, Used rails, Min assist     General bed mobility comments: pt sitting up to L side of bed with increased time and assist needed to manage RLE and to scoot out to EOB to place foot on floor     Transfers Overall transfer level: Needs assistance Equipment used: Rolling walker (2 wheels) Transfers: Sit to/from Stand Sit to Stand: Min assist           General transfer comment: pt standing from slightly elevated EOB with min A to steady on rise, increased time to bring tunk to upright standing    Ambulation/Gait Ambulation/Gait assistance: Min assist (chair follow) Gait Distance (Feet): 30 Feet Assistive device: Rolling walker (2 wheels) Gait Pattern/deviations: Step-to pattern, Decreased step length - right, Decreased step length - left, Decreased stance time - right, Decreased weight shift to right, Antalgic, Trunk flexed Gait velocity: decreased     General Gait Details: Pt ambulated by holding RLE in front of her and advancing RW followed by LLE. Heavy dependence on RW. She self-limited WBing on RLE d/t pain. Pt took standing rest breaks every couple of steps and would opt to rest forearms on RW and increase fwd flex. Cues for improved safety awarness.   Stairs             Wheelchair Mobility     Tilt Bed    Modified Rankin (Stroke Patients Only)       Balance Overall balance assessment: Needs assistance Sitting-balance support: Single extremity supported, Feet supported Sitting balance-[name]: Fair     Standing balance support: Bilateral upper extremity supported, During functional activity, Reliant on assistive device for balance Standing balance-[name]: Poor Standing balance comment: Pt dependent on RW  Communication Communication Communication: No apparent difficulties  Cognition Arousal: Alert Behavior During Therapy: WFL for tasks assessed/performed   PT - Cognitive impairments: No apparent impairments                         Following commands: Intact      Cueing Cueing Techniques: Verbal cues, Gestural cues, Tactile cues  Exercises      General Comments General comments  (skin integrity, edema, etc.): VSS on RA      Pertinent Vitals/Pain Pain Assessment Pain Assessment: Faces Faces Pain Scale: Hurts whole lot Pain Location: R hip and R knee Pain Descriptors / Indicators: Grimacing, Guarding, Operative site guarding, Discomfort, Sharp Pain Intervention(s): Monitored during session, Limited activity within patient's tolerance    Home Living                          Prior Function            PT Goals (current goals can now be found in the care plan section) Acute Rehab PT Goals PT Goal Formulation: With patient Time For Goal Achievement: 12/14/23 Progress towards PT goals: Progressing toward goals    Frequency    7X/week      PT Plan      Co-evaluation              AM-PAC PT 6 Clicks Mobility   Outcome Measure  Help needed turning from your back to your side while in a flat bed without using bedrails?: A Little Help needed moving from lying on your back to sitting on the side of a flat bed without using bedrails?: A Little Help needed moving to and from a bed to a chair (including a wheelchair)?: A Lot Help needed standing up from a chair using your arms (e.g., wheelchair or bedside chair)?: A Lot Help needed to walk in hospital room?: A Lot (<40') Help needed climbing 3-5 steps with a railing? : Total 6 Click Score: 13    End of Session Equipment Utilized During Treatment: Gait belt Activity Tolerance: Patient limited by pain Patient left: in chair;Other (comment) (on handoff to COTA) Nurse Communication: Mobility status;Other (comment) (pt will need PTAR transport to home and PM session prior to d/c) PT Visit Diagnosis: Unsteadiness on feet (R26.81);Other abnormalities of gait and mobility (R26.89);Muscle weakness (generalized) (M62.81);Difficulty in walking, not elsewhere classified (R26.2);Pain Pain - Right/Left: Right Pain - part of body: Knee     Time: 0905-0928 PT Time Calculation (min) (ACUTE ONLY): 23  min  Charges:    $Gait Training: 8-22 mins PT General Charges $$ ACUTE PT VISIT: 1 Visit                     [name] R. PTA Acute Rehabilitation Services Office: [phone]   [name] 12/05/2023, 9:59 AM

## 2023-12-05 NOTE — Progress Notes (Signed)
 Patient ID: Leah Roberts, female   DOB: [birth year], 73 y.o.   MRN: [id number] Patient states that she feels like she is mobilizing better.  Patient states that she would like a laxative.  Plan for discharge today after therapy.

## 2023-12-05 NOTE — Plan of Care (Signed)
  Problem: Activity: Goal: Risk for activity intolerance will decrease Outcome: Not Progressing   Problem: Clinical Measurements: Goal: Will remain free from infection Outcome: Not Progressing   Problem: Coping: Goal: Level of anxiety will decrease Outcome: Not Progressing   Problem: Elimination: Goal: Will not experience complications related to bowel motility Outcome: Not Progressing   Problem: Pain Managment: Goal: General experience of comfort will improve and/or be controlled Outcome: Not Progressing   Problem: Safety: Goal: Ability to remain free from injury will improve Outcome: Not Progressing   Problem: Skin Integrity: Goal: Risk for impaired skin integrity will decrease Outcome: Not Progressing

## 2023-12-05 NOTE — Discharge Summary (Signed)
 Physician Discharge Summary  Patient ID: Leah Roberts MRN: [id number] DOB/AGE: March 22, 1951 73 y.o.  Admit date: 11/30/2023 Discharge date: 12/05/2023  Admission Diagnoses:  Principal Problem:   Total knee replacement status, right Active Problems:   Unilateral primary osteoarthritis, right knee   S/P total knee replacement, right   Discharge Diagnoses:  Same  Past Medical History:  Diagnosis Date   Arthritis    Complication of anesthesia    Hard to wake up   DVT (deep venous thrombosis) (HCC) 2013   Dyspnea    due to knee pain   GERD (gastroesophageal reflux disease)    History of blood transfusion    History of hypertension    Hypertension    Peripheral vascular disease (HCC)    DVT   Pre-diabetes    Pulmonary embolism (HCC) 2013    Surgeries: Procedure(s): ARTHROPLASTY, KNEE, TOTAL on 11/30/2023   Consultants:   Discharged Condition: Improved  Hospital Course: Leah Roberts is an 73 y.o. female who was admitted 11/30/2023 with a chief complaint of No chief complaint on file. , and found to have a diagnosis of Total knee replacement status, right.  They were brought to the operating room on 11/30/2023 and underwent the above named procedures.    They were given perioperative antibiotics:  Anti-infectives (From admission, onward)    Start     Dose/Rate Route Frequency Ordered Stop   11/30/23 1330  ceFAZolin  (ANCEF ) IVPB 2g/100 mL premix        2 g 200 mL/hr over 30 Minutes Intravenous Every 6 hours 11/30/23 1023 12/01/23 1900   11/30/23 0615  ceFAZolin  (ANCEF ) IVPB 2g/100 mL premix        2 g 200 mL/hr over 30 Minutes Intravenous On call to O.R. 11/30/23 [id number] 11/30/23 0810     .  They were given compression stockings, early ambulation, and chemoprophylaxis for DVT prophylaxis.  They benefited maximally from their hospital stay and there were no complications.    Recent vital signs:  Vitals:   12/05/23 0530 12/05/23 0751  BP: 113/62 98/62   Pulse: 87 88  Resp: 17 16  Temp: 99.9 F (37.7 C) 98.8 F (37.1 C)  SpO2: 97% 95%    Recent laboratory studies:  Results for orders placed or performed during the hospital encounter of 11/24/23  Surgical pcr screen   Collection Time: 11/24/23  2:02 PM   Specimen: Nasal Mucosa; Nasal Swab  Result Value Ref Range   MRSA, PCR NEGATIVE NEGATIVE   Staphylococcus aureus NEGATIVE NEGATIVE  Basic metabolic panel per protocol   Collection Time: 11/24/23  2:07 PM  Result Value Ref Range   Sodium 141 135 - 145 mmol/L   Potassium 3.6 3.5 - 5.1 mmol/L   Chloride 110 98 - 111 mmol/L   CO2 22 22 - 32 mmol/L   Glucose, Bld 86 70 - 99 mg/dL   BUN 12 8 - 23 mg/dL   Creatinine, Ser 8.97 (H) 0.44 - 1.00 mg/dL   Calcium  9.2 8.9 - 10.3 mg/dL   GFR, Estimated 59 (L) >60 mL/min   Anion gap 9 5 - 15  CBC per protocol   Collection Time: 11/24/23  2:07 PM  Result Value Ref Range   WBC 6.7 4.0 - 10.5 K/uL   RBC 4.59 3.87 - 5.11 MIL/uL   Hemoglobin 12.5 12.0 - 15.0 g/dL   HCT 60.1 63.9 - 53.9 %   MCV 86.7 80.0 - 100.0 fL   MCH 27.2 26.0 - 34.0 pg  MCHC 31.4 30.0 - 36.0 g/dL   RDW 85.6 88.4 - 84.4 %   Platelets 236 150 - 400 K/uL   nRBC 0.0 0.0 - 0.2 %    Discharge Medications:   Allergies as of 12/05/2023       Reactions   Sulfa Antibiotics Swelling, Rash        Medication List     STOP taking these medications    enoxaparin  100 MG/ML injection Commonly known as: LOVENOX        TAKE these medications    acetaminophen  500 MG tablet Commonly known as: TYLENOL  Take 1,000 mg by mouth 2 (two) times daily as needed for moderate pain (pain score 4-6).   ascorbic acid 500 MG tablet Commonly known as: VITAMIN C Take 500 mg by mouth daily as needed (immune support).   Eliquis  5 MG Tabs tablet Generic drug: apixaban  Take 1 tablet (5 mg total) by mouth 2 (two) times daily.   loratadine 10 MG tablet Commonly known as: CLARITIN Take 10 mg by mouth daily as needed for allergies.    Magnesium  Citrate 100 MG Caps Take 100 mg by mouth 3 (three) times a week.   melatonin 5 MG Tabs Take 5 mg by mouth at bedtime as needed (sleep).   oxyCODONE -acetaminophen  5-325 MG tablet Commonly known as: PERCOCET/ROXICET Take 1 tablet by mouth every 4 (four) hours as needed.   rosuvastatin  5 MG tablet Commonly known as: CRESTOR  Take 1 tablet (5 mg total) by mouth at bedtime.   traMADol  50 MG tablet Commonly known as: ULTRAM  Take 50 mg by mouth 2 (two) times daily as needed (pain).   Tums Ultra 1000 1000 MG chewable tablet Generic drug: calcium  elemental as carbonate Chew 2,000 mg by mouth 2 (two) times daily as needed for heartburn.        Diagnostic Studies: No results found.  Disposition: Discharge disposition: 01-Home or Self Care       Discharge Instructions     Call MD / Call 911   Complete by: As directed    If you experience chest pain or shortness of breath, CALL 911 and be transported to the hospital emergency room.  If you develope a fever above 101 F, pus (white drainage) or increased drainage or redness at the wound, or calf pain, call your surgeon's office.   Call MD / Call 911   Complete by: As directed    If you experience chest pain or shortness of breath, CALL 911 and be transported to the hospital emergency room.  If you develope a fever above 101 F, pus (white drainage) or increased drainage or redness at the wound, or calf pain, call your surgeon's office.   Call MD / Call 911   Complete by: As directed    If you experience chest pain or shortness of breath, CALL 911 and be transported to the hospital emergency room.  If you develope a fever above 101 F, pus (white drainage) or increased drainage or redness at the wound, or calf pain, call your surgeon's office.   Constipation Prevention   Complete by: As directed    Drink plenty of fluids.  Prune juice may be helpful.  You may use a stool softener, such as Colace (over the counter) 100 mg  twice a day.  Use MiraLax  (over the counter) for constipation as needed.   Constipation Prevention   Complete by: As directed    Drink plenty of fluids.  Prune juice may be helpful.  You may use a stool softener, such as Colace (over the counter) 100 mg twice a day.  Use MiraLax  (over the counter) for constipation as needed.   Constipation Prevention   Complete by: As directed    Drink plenty of fluids.  Prune juice may be helpful.  You may use a stool softener, such as Colace (over the counter) 100 mg twice a day.  Use MiraLax  (over the counter) for constipation as needed.   Diet - low sodium heart healthy   Complete by: As directed    Diet - low sodium heart healthy   Complete by: As directed    Diet - low sodium heart healthy   Complete by: As directed    Increase activity slowly as tolerated   Complete by: As directed    Increase activity slowly as tolerated   Complete by: As directed    Increase activity slowly as tolerated   Complete by: As directed    Post-operative opioid taper instructions:   Complete by: As directed    POST-OPERATIVE OPIOID TAPER INSTRUCTIONS: It is important to wean off of your opioid medication as soon as possible. If you do not need pain medication after your surgery it is ok to stop day one. Opioids include: Codeine, Hydrocodone (Norco, Vicodin), Oxycodone (Percocet, oxycontin ) and hydromorphone  amongst others.  Long term and even short term use of opiods can cause: Increased pain response Dependence Constipation Depression Respiratory depression And more.  Withdrawal symptoms can include Flu like symptoms Nausea, vomiting And more Techniques to manage these symptoms Hydrate well Eat regular healthy meals Stay active Use relaxation techniques(deep breathing, meditating, yoga) Do Not substitute Alcohol to help with tapering If you have been on opioids for less than two weeks and do not have pain than it is ok to stop all together.  Plan to wean  off of opioids This plan should start within one week post op of your joint replacement. Maintain the same interval or time between taking each dose and first decrease the dose.  Cut the total daily intake of opioids by one tablet each day Next start to increase the time between doses. The last dose that should be eliminated is the evening dose.      Post-operative opioid taper instructions:   Complete by: As directed    POST-OPERATIVE OPIOID TAPER INSTRUCTIONS: It is important to wean off of your opioid medication as soon as possible. If you do not need pain medication after your surgery it is ok to stop day one. Opioids include: Codeine, Hydrocodone (Norco, Vicodin), Oxycodone (Percocet, oxycontin ) and hydromorphone  amongst others.  Long term and even short term use of opiods can cause: Increased pain response Dependence Constipation Depression Respiratory depression And more.  Withdrawal symptoms can include Flu like symptoms Nausea, vomiting And more Techniques to manage these symptoms Hydrate well Eat regular healthy meals Stay active Use relaxation techniques(deep breathing, meditating, yoga) Do Not substitute Alcohol to help with tapering If you have been on opioids for less than two weeks and do not have pain than it is ok to stop all together.  Plan to wean off of opioids This plan should start within one week post op of your joint replacement. Maintain the same interval or time between taking each dose and first decrease the dose.  Cut the total daily intake of opioids by one tablet each day Next start to increase the time between doses. The last dose that should be eliminated is the evening dose.  Post-operative opioid taper instructions:   Complete by: As directed    POST-OPERATIVE OPIOID TAPER INSTRUCTIONS: It is important to wean off of your opioid medication as soon as possible. If you do not need pain medication after your surgery it is ok to stop day  one. Opioids include: Codeine, Hydrocodone (Norco, Vicodin), Oxycodone (Percocet, oxycontin ) and hydromorphone  amongst others.  Long term and even short term use of opiods can cause: Increased pain response Dependence Constipation Depression Respiratory depression And more.  Withdrawal symptoms can include Flu like symptoms Nausea, vomiting And more Techniques to manage these symptoms Hydrate well Eat regular healthy meals Stay active Use relaxation techniques(deep breathing, meditating, yoga) Do Not substitute Alcohol to help with tapering If you have been on opioids for less than two weeks and do not have pain than it is ok to stop all together.  Plan to wean off of opioids This plan should start within one week post op of your joint replacement. Maintain the same interval or time between taking each dose and first decrease the dose.  Cut the total daily intake of opioids by one tablet each day Next start to increase the time between doses. The last dose that should be eliminated is the evening dose.           Follow-up Information     Harden Jerona GAILS, MD Follow up in 1 week(s).   Specialty: Orthopedic Surgery Contact information: [address] [place]  [place] [address] [phone]         Care, [facility] Follow up.   Specialty: Home Health Services Contact information: [address] [place] [address] [phone]                  Signed: Jerona GAILS Harden 12/05/2023, 8:12 AM

## 2023-12-05 NOTE — Progress Notes (Signed)
 Physical Therapy Treatment Patient Details Name: Leah Roberts MRN: [id number] DOB: December 10, 1950 Today's Date: 12/05/2023   History of Present Illness Pt is a 73 y.o. female who presented 11/30/23 for elective R TKA. PMH: arthritis, DVT, GERD, HTN, PVD, pre-diabetes, PE    PT Comments  Pt seen for second session for education and task practice of car transfer as pt declining PTAR transport to home. Pt able to stand from recliner chair with increased time and CGA and step pivot over to standard chair without armrests to simulate car with RW for support. Pt needing min A to lift and bring RLE up and over obstacle with gait belt as leg lifter to simulate threshold to car as well as min A to bring leg up and back over to simulate exiting vehicle. Pt continues to demonstrate slow gait with RW for support with antalgic pattern and heavy UE use. Pt was educated on continued walker use to maximize functional independence, safety, and decrease risk for falls, ice, HEP and compliance, appropriate activity progression and importance of continued mobility with pt verbalizing understanding.    If plan is discharge home, recommend the following: Two people to help with walking and/or transfers;A lot of help with bathing/dressing/bathroom;Assistance with cooking/housework;Assist for transportation;Help with stairs or ramp for entrance   Can travel by private vehicle        Equipment Recommendations  Wheelchair (measurements PT)    Recommendations for Other Services       Precautions / Restrictions Precautions Precautions: Knee;Fall Precaution Booklet Issued: Yes (comment) Recall of Precautions/Restrictions: Intact Restrictions Weight Bearing Restrictions Per Provider Order: Yes RLE Weight Bearing Per Provider Order: Weight bearing as tolerated     Mobility  Bed Mobility Overal bed mobility: Needs Assistance Bed Mobility: Supine to Sit     Supine to sit: HOB elevated, Used rails, Min  assist     General bed mobility comments: pt up in recliner on arrival and at end of session    Transfers Overall transfer level: Needs assistance Equipment used: Rolling walker (2 wheels) Transfers: Sit to/from Stand Sit to Stand: Min assist, Contact guard assist           General transfer comment: pt standing from recliner x1, BSC and standard chair without arm rests, CGA from recliner and BSC, min A from chair without armrests, increased time spent simulating car entry, lifting RLE up and over trashcan to simulate up and into SUV    Ambulation/Gait Ambulation/Gait assistance: Contact guard assist (chair follow) Gait Distance (Feet): 15 Feet (x2) Assistive device: Rolling walker (2 wheels) Gait Pattern/deviations: Step-to pattern, Decreased step length - right, Decreased step length - left, Decreased stance time - right, Decreased weight shift to right, Antalgic, Trunk flexed Gait velocity: decreased     General Gait Details: pt ambulating to and from bathroom, slow but steady gait with RW for support, distance limited by pain   Stairs             Wheelchair Mobility     Tilt Bed    Modified Rankin (Stroke Patients Only)       Balance Overall balance assessment: Needs assistance Sitting-balance support: Single extremity supported, Feet supported Sitting balance-[name]: Fair     Standing balance support: Bilateral upper extremity supported, During functional activity, Reliant on assistive device for balance Standing balance-[name]: Poor Standing balance comment: able to stand with one extremity support during toilet hygiene with use of grab bar for support  Communication Communication Communication: No apparent difficulties  Cognition Arousal: Alert Behavior During Therapy: WFL for tasks assessed/performed   PT - Cognitive impairments: No apparent impairments                         Following  commands: Intact      Cueing Cueing Techniques: Verbal cues, Gestural cues, Tactile cues  Exercises      General Comments        Pertinent Vitals/Pain Pain Assessment Pain Assessment: Faces Faces Pain Scale: Hurts even more Pain Location: R hip and R knee Pain Descriptors / Indicators: Grimacing, Guarding, Operative site guarding, Discomfort, Sharp Pain Intervention(s): Monitored during session, Limited activity within patient's tolerance    Home Living                          Prior Function            PT Goals (current goals can now be found in the care plan section) Acute Rehab PT Goals PT Goal Formulation: With patient Time For Goal Achievement: 12/14/23 Progress towards PT goals: Progressing toward goals    Frequency    7X/week      PT Plan      Co-evaluation              AM-PAC PT 6 Clicks Mobility   Outcome Measure  Help needed turning from your back to your side while in a flat bed without using bedrails?: A Little Help needed moving from lying on your back to sitting on the side of a flat bed without using bedrails?: A Little Help needed moving to and from a bed to a chair (including a wheelchair)?: A Lot Help needed standing up from a chair using your arms (e.g., wheelchair or bedside chair)?: A Lot Help needed to walk in hospital room?: A Lot (<40') Help needed climbing 3-5 steps with a railing? : Total 6 Click Score: 13    End of Session Equipment Utilized During Treatment: Gait belt Activity Tolerance: Patient limited by pain Patient left: in chair;with call bell/phone within reach Nurse Communication: Mobility status PT Visit Diagnosis: Unsteadiness on feet (R26.81);Other abnormalities of gait and mobility (R26.89);Muscle weakness (generalized) (M62.81);Difficulty in walking, not elsewhere classified (R26.2);Pain Pain - Right/Left: Right Pain - part of body: Knee     Time: 8677-8650 PT Time Calculation (min) (ACUTE  ONLY): 27 min  Charges:    $Gait Training: 8-22 mins $Therapeutic Activity: 8-22 mins PT General Charges $$ ACUTE PT VISIT: 1 Visit                     Susannah Carbin R. PTA Acute Rehabilitation Services Office: [phone]   [name] 12/05/2023, 2:25 PM

## 2023-12-05 NOTE — Progress Notes (Addendum)
 Discharge order noted per MD. Discharge RN at the bedside, discussed pt care with primary RN. No TOC needs, no delays/barriers. Pt premedicated for pain prior to discharge to assist with pain control during transportation. PIV removed. AVS printed/reviewed. Bathroom needs met. All belongings accounted for. Pt agreeable/ready for discharge. Pt family arriving to hospital soon. Pt taken to discharge lounge.

## 2023-12-06 ENCOUNTER — Telehealth: Payer: Self-pay

## 2023-12-06 NOTE — Telephone Encounter (Signed)
 I called and sw the pt's son and he also patched in a call with the pt's siter who is there taking care of her. I advised per Dr. Duda that the pt should come in for eval and the pt's son declined appt said that he wanted a good contact number in the event of an emergency. Again I advised that it is difficult to assess post operative complications by phone and encouraged an appt today. Again declined. Advised that we have some one in the office everyday should they change their mind.

## 2023-12-06 NOTE — Telephone Encounter (Signed)
 Son called stating th ept is having severe pain and swelling. Discharged yesterday from hospital. Has hx of blood clots. Patient is on ELIQUIS  according to chart.  Son: [phone]

## 2023-12-08 ENCOUNTER — Telehealth: Payer: Self-pay | Admitting: Orthopedic Surgery

## 2023-12-08 NOTE — Telephone Encounter (Signed)
 Katie PT from [facility] wanted to let you know she went to see her for home PT. She wants to do 1 wk 3, and she is limited by her lack of insurance. She will have a Child psychotherapist to come out to see her about community resources. She ask if she can go ahead and set up for outpatient therapy close to her that will cover her insurance. CB#[phone]

## 2023-12-09 ENCOUNTER — Other Ambulatory Visit: Payer: Self-pay | Admitting: Orthopedic Surgery

## 2023-12-09 DIAGNOSIS — Z96651 Presence of right artificial knee joint: Secondary | ICD-10-CM

## 2023-12-09 NOTE — Telephone Encounter (Signed)
 LM on Leah Roberts of [facility], will put in referral for out patient PT, pt lives in [place], will do one for [place].

## 2023-12-12 ENCOUNTER — Ambulatory Visit (INDEPENDENT_AMBULATORY_CARE_PROVIDER_SITE_OTHER): Payer: Self-pay | Admitting: Orthopedic Surgery

## 2023-12-12 ENCOUNTER — Other Ambulatory Visit: Payer: Self-pay

## 2023-12-12 ENCOUNTER — Other Ambulatory Visit (INDEPENDENT_AMBULATORY_CARE_PROVIDER_SITE_OTHER): Payer: Self-pay

## 2023-12-12 DIAGNOSIS — Z96651 Presence of right artificial knee joint: Secondary | ICD-10-CM

## 2023-12-12 MED ORDER — OXYCODONE-ACETAMINOPHEN 10-325 MG PO TABS
1.0000 | ORAL_TABLET | ORAL | 0 refills | Status: DC | PRN
  Filled 2023-12-12: qty 30, 5d supply, fill #0

## 2023-12-13 ENCOUNTER — Encounter: Payer: Self-pay | Admitting: Orthopedic Surgery

## 2023-12-13 NOTE — Progress Notes (Signed)
 Office Visit Note   Patient: Leah Roberts           Date of Birth: Mar 01, 1951           MRN: [id number] Visit Date: 12/12/2023              Requested by: Dineen Rollene MATSU, FNP [address] [place],  [place] [address] PCP: Dineen Rollene MATSU, FNP  Chief Complaint  Patient presents with   Right Knee - Routine Post Op    11/30/2023 right TKR      HPI: Patient is a 73 year old woman who is status post right total knee arthroplasty on July 30.  Patient states that she has pain with flexion of the knee and uses a gait belt to lift her leg.  Assessment & Plan: Visit Diagnoses:  1. S/P total knee replacement, right     Plan: Patient will work on flexion.  Follow-up in 1 week for suture removal.  Prescription provided for oxycodone  10 mg.  Follow-Up Instructions: Return in about 1 week (around 12/19/2023).   Ortho Exam  Patient is alert, oriented, no adenopathy, well-dressed, normal affect, normal respiratory effort. Examination patient does have swelling most likely secondary to her Eliquis .  The patella tracks midline.  The incision is well-approximated but there is tension on the suture line and will leave the staples in place.  There is no cellulitis.  Patient states that her leg was feeling better a few days ago.  Recommend to continue with elevation and ice.  Patient denies any recent temperature fever or chills.    Imaging: XR Knee 1-2 Views Right Result Date: 12/13/2023 2 view radiographs of the right knee shows a stable total knee arthroplasty.  There is midline alignment of the patella with no patella Ulta.  No evidence of fracture.  No images are attached to the encounter.  Labs: Lab Results  Component Value Date   HGBA1C 6.5 09/22/2023   HGBA1C 6.1 (A) 01/12/2023   HGBA1C 6.7 (H) 05/19/2022   ESRSEDRATE 10 05/29/2020   CRP <1.0 05/29/2020     Lab Results  Component Value Date   ALBUMIN  4.0 03/08/2022   ALBUMIN  3.7 01/13/2022   ALBUMIN   3.7 12/30/2021    Lab Results  Component Value Date   MG 2.0 01/04/2022   MG 2.0 01/03/2022   MG 1.8 01/02/2022   Lab Results  Component Value Date   VD25OH 21.43 (L) 10/05/2017    No results found for: PREALBUMIN    Latest Ref Rng & Units 11/24/2023    2:07 PM 06/17/2023    2:02 PM 09/22/2022    2:52 PM  CBC EXTENDED  WBC 4.0 - 10.5 K/uL 6.7  6.5  6.3   RBC 3.87 - 5.11 MIL/uL 4.59  4.53  4.52   Hemoglobin 12.0 - 15.0 g/dL 87.4  87.5  87.5   HCT 36.0 - 46.0 % 39.8  38.8  38.3   Platelets 150 - 400 K/uL 236  239  243      There is no height or weight on file to calculate BMI.  Orders:  Orders Placed This Encounter  Procedures   XR Knee 1-2 Views Right   Meds ordered this encounter  Medications   oxyCODONE -acetaminophen  (PERCOCET) 10-325 MG tablet    Sig: Take 1 tablet by mouth every 4 (four) hours as needed for pain.    Dispense:  30 tablet    Refill:  0     Procedures: No procedures  performed  Clinical Data: No additional findings.  ROS:  All other systems negative, except as noted in the HPI. Review of Systems  Objective: Vital Signs: There were no vitals taken for this visit.  Specialty Comments:  MRI LUMBAR SPINE WITHOUT CONTRAST   TECHNIQUE: Multiplanar, multisequence MR imaging of the lumbar spine was performed. No intravenous contrast was administered.   COMPARISON:  Lumbar spine radiographs 01/27/2023   FINDINGS: Segmentation:  Standard.   Alignment:  3 mm anterolisthesis of L4 on L5.   Vertebrae: No fracture or suspicious marrow lesion. Mild degenerative facet edema at L4-5.   Conus medullaris and cauda equina: Conus extends to the L1 level. Conus and cauda equina appear normal.   Paraspinal and other soft tissues: Unremarkable.   Disc levels:   T12-L1: Minimal disc bulging, small left central disc protrusion, and mild right and moderate left facet and ligamentum flavum hypertrophy without stenosis.   L1-2: Minimal disc  bulging, a shallow left paracentral to left subarticular disc protrusion, and moderate facet and ligamentum flavum hypertrophy without stenosis.   L2-3: Mild disc bulging and severe facet and ligamentum flavum hypertrophy without stenosis.   L3-4: Minimal disc bulging and severe facet and ligamentum flavum hypertrophy without stenosis.   L4-5: Disc desiccation. Anterolisthesis with mild disc uncovering and severe facet and ligamentum flavum hypertrophy without stenosis.   L5-S1: Disc desiccation and moderate disc space narrowing. Disc bulging, endplate spurring, and severe facet and ligamentum flavum hypertrophy result in mild bilateral lateral recess stenosis and moderate to severe bilateral neural foraminal stenosis without significant spinal stenosis.   IMPRESSION: 1. Severe diffuse lumbar facet arthrosis. 2. Moderate to severe bilateral neural foraminal stenosis at L5-S1. 3. No spinal stenosis.     Electronically Signed   By: Dasie Hamburg M.D.   On: 03/07/2023 08:47  PMFS History: Patient Active Problem List   Diagnosis Date Noted   S/P total knee replacement, right 12/04/2023   Total knee replacement status, right 11/30/2023   Unilateral primary osteoarthritis, right knee 11/30/2023   Anticoagulated 11/23/2023   Burn 06/27/2023   Hemorrhoid 03/23/2023   RVF (right ventricular failure) (HCC) 01/12/2023   Atopic dermatitis 01/12/2023   Shortness of breath 08/29/2022   Encounter for screening mammogram for malignant neoplasm of breast 08/29/2022   Right groin pain 01/13/2022   Bilateral pulmonary embolism with right heart strain RV/LV ratio 1:5 (HCC) 12/31/2021   Acute respiratory failure with hypoxia (HCC) 12/31/2021   Total knee replacement status, left 12/09/21 12/09/2021   Unilateral primary osteoarthritis, left knee    Shoulder disorder 09/03/2021   Arthritis of knee 09/03/2021   Lumbar radiculopathy 09/03/2021   Abnormal MRI, knee 09/03/2021   Chronic pain of  right knee 09/03/2021   Bronchitis 03/25/2021   Anemia 01/09/2020   HTN (hypertension) 12/31/2019   Chest pain 12/31/2019   DOE (dyspnea on exertion) 12/31/2019   Acute coronary syndrome (HCC) 12/31/2019   Unstable angina (HCC) 12/31/2019   Atherosclerosis 08/23/2018   Screening for tuberculosis 07/12/2018   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   Arthralgia 07/12/2018   Solitary pulmonary nodule 07/12/2018   Neck pain 02/15/2018   Dizziness 02/15/2018   Elevated TSH 02/15/2018   Screening for HIV (human immunodeficiency virus) 10/05/2017   Neuropathy 10/05/2017   Screening for colon cancer 10/05/2017   Hyperlipidemia 08/04/2016   Prediabetes 08/04/2016   Back pain 08/04/2016   Bilateral knee pain 06/18/2016   History of DVT of lower extremity 04/01/2014   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) 05/04/2011   Past Medical History:  Diagnosis Date   Arthritis    Complication of anesthesia    Hard to wake up   DVT (deep venous thrombosis) (HCC) 2013   Dyspnea    due to knee pain   GERD (gastroesophageal reflux disease)    History of blood transfusion    History of hypertension    Hypertension    Peripheral vascular disease (HCC)    DVT   Pre-diabetes    Pulmonary embolism (HCC) 2013    Family History  Problem Relation Age of Onset   Hypertension Father    Heart disease Father 37   Thyroid disease Sister    Allergies Sister    CVA Neg Hx    Heart attack Neg Hx    High Cholesterol Neg Hx    Colon cancer Neg Hx     Past Surgical History:  Procedure Laterality Date   APPENDECTOMY     PULMONARY THROMBECTOMY Bilateral 01/01/2022   Procedure: PULMONARY THROMBECTOMY;  Surgeon: Jama Cordella MATSU, MD;  Location: [facility] INVASIVE CV LAB;  Service: Cardiovascular;  Laterality: Bilateral;   TONSILLECTOMY AND ADENOIDECTOMY     TOTAL KNEE ARTHROPLASTY Left 12/09/2021   Procedure: LEFT TOTAL KNEE ARTHROPLASTY;  Surgeon: Harden Jerona GAILS, MD;  Location: [facility] OR;  Service: Orthopedics;   Laterality: Left;   TOTAL KNEE ARTHROPLASTY Right 11/30/2023   Procedure: ARTHROPLASTY, KNEE, TOTAL;  Surgeon: Harden Jerona GAILS, MD;  Location: [facility] OR;  Service: Orthopedics;  Laterality: Right;   Social History   Occupational History   Not on file  Tobacco Use   Smoking status: Never   Smokeless tobacco: Never  Vaping Use   Vaping status: Never Used  Substance and Sexual Activity   Alcohol use: Yes    Comment: Ocassional glass of wine   Drug use: No   Sexual activity: Not on file

## 2023-12-14 ENCOUNTER — Encounter: Admitting: Family

## 2023-12-16 NOTE — Telephone Encounter (Signed)
 Spoke to pt she will call surgeon that performed her surgery, to see if they would send in muscle relaxer for her, but also would like to know if we can approve PT to come out to help her with her recovery

## 2023-12-16 NOTE — Telephone Encounter (Unsigned)
 Copied from CRM [id number]. Topic: Clinical - Medication Question >> Dec 16, 2023  3:47 PM Chiquita SQUIBB wrote: Reason for CRM: Patient is calling in requesting a muscle relaxer for her total knee replacement she had on the 30th, to help relax the muscles. Patient is also asking if Rollene can extend the patients at home physical therapy. Patient also has concerns of getting a shower chair that has the legs in the tub but also out of the tub as well that she can slid into the shower. Please advise patient if Rollene can help with these.

## 2023-12-20 ENCOUNTER — Telehealth: Payer: Self-pay

## 2023-12-20 ENCOUNTER — Telehealth: Payer: Self-pay | Admitting: Physician Assistant

## 2023-12-20 NOTE — Telephone Encounter (Signed)
 Pt called requesting a refill of oxycodone . Please send to [facility]. Pt states she only have a few pills left. Pt phone number is [phone]. Please call when sent

## 2023-12-20 NOTE — Telephone Encounter (Signed)
 Copied from CRM #[id number]. Topic: Referral - Status >> Dec 20, 2023  3:37 PM Leah Roberts wrote: Reason for CRM: patient would like to know th referral status for her physical therapy referral

## 2023-12-21 ENCOUNTER — Telehealth: Payer: Self-pay | Admitting: Orthopedic Surgery

## 2023-12-21 ENCOUNTER — Other Ambulatory Visit: Payer: Self-pay

## 2023-12-21 ENCOUNTER — Other Ambulatory Visit: Payer: Self-pay | Admitting: Orthopedic Surgery

## 2023-12-21 MED ORDER — OXYCODONE-ACETAMINOPHEN 10-325 MG PO TABS
1.0000 | ORAL_TABLET | ORAL | 0 refills | Status: DC | PRN
  Filled 2023-12-21: qty 30, 5d supply, fill #0

## 2023-12-21 NOTE — Telephone Encounter (Signed)
 Request has been sent to provider by pharmacy communication.

## 2023-12-21 NOTE — Telephone Encounter (Signed)
 This pt is s/p a total knee please see below and advise.

## 2023-12-21 NOTE — Telephone Encounter (Signed)
 Pt called yesterday for refill of oxycodone . Pt state she is completely out of oxycodone  and haven't slept due to pains. Please send medication ASAP. Please send to pharmacy on file. Please call pt when meds have been sent. Pt phone number is [phone].

## 2023-12-22 ENCOUNTER — Ambulatory Visit (INDEPENDENT_AMBULATORY_CARE_PROVIDER_SITE_OTHER): Payer: Self-pay | Admitting: Orthopedic Surgery

## 2023-12-22 DIAGNOSIS — Z96651 Presence of right artificial knee joint: Secondary | ICD-10-CM

## 2023-12-22 NOTE — Telephone Encounter (Signed)
 Pt has an appt.

## 2023-12-23 ENCOUNTER — Other Ambulatory Visit: Payer: Self-pay

## 2023-12-23 ENCOUNTER — Encounter: Payer: Self-pay | Admitting: Orthopedic Surgery

## 2023-12-23 NOTE — Progress Notes (Signed)
 Office Visit Note   Patient: Leah Roberts           Date of Birth: 12/09/1950           MRN: [id number] Visit Date: 12/22/2023              Requested by: Dineen Rollene MATSU, FNP [address] [place],  [place] [address] PCP: Dineen Rollene MATSU, FNP  Chief Complaint  Patient presents with   Right Knee - Routine Post Op    11/30/2023 right TKR      HPI: Patient is a 73 year old woman who is 3 weeks status post right total knee arthroplasty.  Patient states that due to her insurance she only had 3 physical therapy visits.  She states she has stiffness in her knee.  Assessment & Plan: Visit Diagnoses:  1. S/P total knee replacement, right     Plan: Will set up physical therapy at [facility].  Prescription provided for for Robaxin .  Follow-Up Instructions: Return in about 2 weeks (around 01/05/2024).   Ortho Exam  Patient is alert, oriented, no adenopathy, well-dressed, normal affect, normal respiratory effort. Examination the incision is well-healed staples are harvested.  Patient is improving her range of motion but current motion is 10 to 90 degrees.    Imaging: No results found. No images are attached to the encounter.  Labs: Lab Results  Component Value Date   HGBA1C 6.5 09/22/2023   HGBA1C 6.1 (A) 01/12/2023   HGBA1C 6.7 (H) 05/19/2022   ESRSEDRATE 10 05/29/2020   CRP <1.0 05/29/2020     Lab Results  Component Value Date   ALBUMIN  4.0 03/08/2022   ALBUMIN  3.7 01/13/2022   ALBUMIN  3.7 12/30/2021    Lab Results  Component Value Date   MG 2.0 01/04/2022   MG 2.0 01/03/2022   MG 1.8 01/02/2022   Lab Results  Component Value Date   VD25OH 21.43 (L) 10/05/2017    No results found for: PREALBUMIN    Latest Ref Rng & Units 11/24/2023    2:07 PM 06/17/2023    2:02 PM 09/22/2022    2:52 PM  CBC EXTENDED  WBC 4.0 - 10.5 K/uL 6.7  6.5  6.3   RBC 3.87 - 5.11 MIL/uL 4.59  4.53  4.52   Hemoglobin 12.0 - 15.0 g/dL 87.4   87.5  87.5   HCT 36.0 - 46.0 % 39.8  38.8  38.3   Platelets 150 - 400 K/uL 236  239  243      There is no height or weight on file to calculate BMI.  Orders:  Orders Placed This Encounter  Procedures   Ambulatory referral to Physical Therapy   No orders of the defined types were placed in this encounter.    Procedures: No procedures performed  Clinical Data: No additional findings.  ROS:  All other systems negative, except as noted in the HPI. Review of Systems  Objective: Vital Signs: There were no vitals taken for this visit.  Specialty Comments:  MRI LUMBAR SPINE WITHOUT CONTRAST   TECHNIQUE: Multiplanar, multisequence MR imaging of the lumbar spine was performed. No intravenous contrast was administered.   COMPARISON:  Lumbar spine radiographs 01/27/2023   FINDINGS: Segmentation:  Standard.   Alignment:  3 mm anterolisthesis of L4 on L5.   Vertebrae: No fracture or suspicious marrow lesion. Mild degenerative facet edema at L4-5.   Conus medullaris and cauda equina: Conus extends to the L1 level. Conus and cauda equina  appear normal.   Paraspinal and other soft tissues: Unremarkable.   Disc levels:   T12-L1: Minimal disc bulging, small left central disc protrusion, and mild right and moderate left facet and ligamentum flavum hypertrophy without stenosis.   L1-2: Minimal disc bulging, a shallow left paracentral to left subarticular disc protrusion, and moderate facet and ligamentum flavum hypertrophy without stenosis.   L2-3: Mild disc bulging and severe facet and ligamentum flavum hypertrophy without stenosis.   L3-4: Minimal disc bulging and severe facet and ligamentum flavum hypertrophy without stenosis.   L4-5: Disc desiccation. Anterolisthesis with mild disc uncovering and severe facet and ligamentum flavum hypertrophy without stenosis.   L5-S1: Disc desiccation and moderate disc space narrowing. Disc bulging, endplate spurring, and  severe facet and ligamentum flavum hypertrophy result in mild bilateral lateral recess stenosis and moderate to severe bilateral neural foraminal stenosis without significant spinal stenosis.   IMPRESSION: 1. Severe diffuse lumbar facet arthrosis. 2. Moderate to severe bilateral neural foraminal stenosis at L5-S1. 3. No spinal stenosis.     Electronically Signed   By: Dasie Hamburg M.D.   On: 03/07/2023 08:47  PMFS History: Patient Active Problem List   Diagnosis Date Noted   S/P total knee replacement, right 12/04/2023   Total knee replacement status, right 11/30/2023   Unilateral primary osteoarthritis, right knee 11/30/2023   Anticoagulated 11/23/2023   Burn 06/27/2023   Hemorrhoid 03/23/2023   RVF (right ventricular failure) (HCC) 01/12/2023   Atopic dermatitis 01/12/2023   Shortness of breath 08/29/2022   Encounter for screening mammogram for malignant neoplasm of breast 08/29/2022   Right groin pain 01/13/2022   Bilateral pulmonary embolism with right heart strain RV/LV ratio 1:5 (HCC) 12/31/2021   Acute respiratory failure with hypoxia (HCC) 12/31/2021   Total knee replacement status, left 12/09/21 12/09/2021   Unilateral primary osteoarthritis, left knee    Shoulder disorder 09/03/2021   Arthritis of knee 09/03/2021   Lumbar radiculopathy 09/03/2021   Abnormal MRI, knee 09/03/2021   Chronic pain of right knee 09/03/2021   Bronchitis 03/25/2021   Anemia 01/09/2020   HTN (hypertension) 12/31/2019   Chest pain 12/31/2019   DOE (dyspnea on exertion) 12/31/2019   Acute coronary syndrome (HCC) 12/31/2019   Unstable angina (HCC) 12/31/2019   Atherosclerosis 08/23/2018   Screening for tuberculosis 07/12/2018   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   Arthralgia 07/12/2018   Solitary pulmonary nodule 07/12/2018   Neck pain 02/15/2018   Dizziness 02/15/2018   Elevated TSH 02/15/2018   Screening for HIV (human immunodeficiency virus) 10/05/2017   Neuropathy 10/05/2017   Screening  for colon cancer 10/05/2017   Hyperlipidemia 08/04/2016   Prediabetes 08/04/2016   Back pain 08/04/2016   Bilateral knee pain 06/18/2016   History of DVT of lower extremity 04/01/2014   History of DVT and PE (deep vein thrombosis) 05/04/2011   Past Medical History:  Diagnosis Date   Arthritis    Complication of anesthesia    Hard to wake up   DVT (deep venous thrombosis) (HCC) 2013   Dyspnea    due to knee pain   GERD (gastroesophageal reflux disease)    History of blood transfusion    History of hypertension    Hypertension    Peripheral vascular disease (HCC)    DVT   Pre-diabetes    Pulmonary embolism (HCC) 2013    Family History  Problem Relation Age of Onset   Hypertension Father    Heart disease Father 37   Thyroid disease Sister  Allergies Sister    CVA Neg Hx    Heart attack Neg Hx    High Cholesterol Neg Hx    Colon cancer Neg Hx     Past Surgical History:  Procedure Laterality Date   APPENDECTOMY     PULMONARY THROMBECTOMY Bilateral 01/01/2022   Procedure: PULMONARY THROMBECTOMY;  Surgeon: Jama Cordella MATSU, MD;  Location: [facility] INVASIVE CV LAB;  Service: Cardiovascular;  Laterality: Bilateral;   TONSILLECTOMY AND ADENOIDECTOMY     TOTAL KNEE ARTHROPLASTY Left 12/09/2021   Procedure: LEFT TOTAL KNEE ARTHROPLASTY;  Surgeon: Harden Jerona GAILS, MD;  Location: [facility] OR;  Service: Orthopedics;  Laterality: Left;   TOTAL KNEE ARTHROPLASTY Right 11/30/2023   Procedure: ARTHROPLASTY, KNEE, TOTAL;  Surgeon: Harden Jerona GAILS, MD;  Location: [facility] OR;  Service: Orthopedics;  Laterality: Right;   Social History   Occupational History   Not on file  Tobacco Use   Smoking status: Never   Smokeless tobacco: Never  Vaping Use   Vaping status: Never Used  Substance and Sexual Activity   Alcohol use: Yes    Comment: Ocassional glass of wine   Drug use: No   Sexual activity: Not on file

## 2023-12-27 ENCOUNTER — Telehealth: Payer: Self-pay

## 2023-12-27 NOTE — Telephone Encounter (Signed)
 Received patient assistance of Eliquis  for pt. Lvm  3 bottles  Lot: [id number] S Exp: 01/31/2026

## 2023-12-27 NOTE — Telephone Encounter (Unsigned)
 Copied from CRM [id number]. Topic: Clinical - Medical Advice >> Dec 27, 2023 12:04 PM Turkey A wrote: Reason for CRM: Patient has been waiting for a call back regarding Physical Therapy for her Knee replacement -please call patient

## 2023-12-27 NOTE — Telephone Encounter (Signed)
 Spoke to pt gave her number to [facility] PT to give them a call to check on referral

## 2023-12-27 NOTE — Telephone Encounter (Signed)
 Spoke to pt notified pt meds have to be picked up from office

## 2023-12-28 ENCOUNTER — Ambulatory Visit: Payer: Self-pay | Attending: Orthopedic Surgery

## 2023-12-28 DIAGNOSIS — Z96651 Presence of right artificial knee joint: Secondary | ICD-10-CM | POA: Insufficient documentation

## 2023-12-28 DIAGNOSIS — M25561 Pain in right knee: Secondary | ICD-10-CM | POA: Insufficient documentation

## 2023-12-28 DIAGNOSIS — R262 Difficulty in walking, not elsewhere classified: Secondary | ICD-10-CM | POA: Insufficient documentation

## 2023-12-28 DIAGNOSIS — M25661 Stiffness of right knee, not elsewhere classified: Secondary | ICD-10-CM | POA: Insufficient documentation

## 2023-12-28 DIAGNOSIS — G8929 Other chronic pain: Secondary | ICD-10-CM | POA: Insufficient documentation

## 2023-12-28 NOTE — Therapy (Signed)
 OUTPATIENT PHYSICAL THERAPY  EVALUATION   Patient Name: Leah Roberts MRN: [id number] DOB:01-20-1951, 73 y.o., female Today's Date: 12/28/2023  END OF SESSION:  PT End of Session - 12/28/23 1037     Visit Number 1    Number of Visits 17    Date for PT Re-Evaluation 02/24/24    PT Start Time 1037    PT Stop Time 1131    PT Time Calculation (min) 54 min    Activity Tolerance Patient tolerated treatment well;Patient limited by pain    Behavior During Therapy [facility] for tasks assessed/performed          Past Medical History:  Diagnosis Date   Arthritis    Complication of anesthesia    Hard to wake up   DVT (deep venous thrombosis) (HCC) 2013   Dyspnea    due to knee pain   GERD (gastroesophageal reflux disease)    History of blood transfusion    History of hypertension    Hypertension    Peripheral vascular disease (HCC)    DVT   Pre-diabetes    Pulmonary embolism (HCC) 2013   Past Surgical History:  Procedure Laterality Date   APPENDECTOMY     PULMONARY THROMBECTOMY Bilateral 01/01/2022   Procedure: PULMONARY THROMBECTOMY;  Surgeon: Jama Cordella MATSU, MD;  Location: [facility] INV[facility]IVE [facility];  Service: Cardiovascular;  Laterality: Bilateral;   TONSILLECTOMY AND ADENOIDECTOMY     TOTAL KNEE ARTHROPLASTY Left 12/09/2021   Procedure: LEFT TOTAL KNEE ARTHROPLASTY;  Surgeon: Harden Jerona GAILS, MD;  Location: [facility] OR;  Service: Orthopedics;  Laterality: Left;   TOTAL KNEE ARTHROPLASTY Right 11/30/2023   Procedure: ARTHROPLASTY, KNEE, TOTAL;  Surgeon: Harden Jerona GAILS, MD;  Location: [facility] OR;  Service: Orthopedics;  Laterality: Right;   Patient Active Problem List   Diagnosis Date Noted   S/P total knee replacement, right 12/04/2023   Total knee replacement status, right 11/30/2023   Unilateral primary osteoarthritis, right knee 11/30/2023   Anticoagulated 11/23/2023   Burn 06/27/2023   Hemorrhoid 03/23/2023   RVF (right ventricular failure) (HCC) 01/12/2023   Atopic dermatitis  01/12/2023   Shortness of breath 08/29/2022   Encounter for screening mammogram for malignant neoplasm of breast 08/29/2022   Right groin pain 01/13/2022   Bilateral pulmonary embolism with right heart strain RV/LV ratio 1:5 (HCC) 12/31/2021   Acute respiratory failure with hypoxia (HCC) 12/31/2021   Total knee replacement status, left 12/09/21 12/09/2021   Unilateral primary osteoarthritis, left knee    Shoulder disorder 09/03/2021   Arthritis of knee 09/03/2021   Lumbar radiculopathy 09/03/2021   Abnormal MRI, knee 09/03/2021   Chronic pain of right knee 09/03/2021   Bronchitis 03/25/2021   Anemia 01/09/2020   HTN (hypertension) 12/31/2019   Chest pain 12/31/2019   DOE (dyspnea on exertion) 12/31/2019   Acute coronary syndrome (HCC) 12/31/2019   Unstable angina (HCC) 12/31/2019   Atherosclerosis 08/23/2018   Screening for tuberculosis 07/12/2018   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   Arthralgia 07/12/2018   Solitary pulmonary nodule 07/12/2018   Neck pain 02/15/2018   Dizziness 02/15/2018   Elevated TSH 02/15/2018   Screening for HIV (human immunodeficiency virus) 10/05/2017   Neuropathy 10/05/2017   Screening for colon cancer 10/05/2017   Hyperlipidemia 08/04/2016   Prediabetes 08/04/2016   Back pain 08/04/2016   Bilateral knee pain 06/18/2016   History of DVT of lower extremity 04/01/2014   History of DVT and PE (deep vein thrombosis) 05/04/2011    PCP: Dineen Rollene MATSU, FNP  REFERRING PROVIDER: Harden Jerona GAILS, MD  REFERRING DIAG: Diagnosis [phone] (ICD-10-CM) - S/P total knee replacement, right  Rationale for Evaluation and Treatment: Rehabilitation  THERAPY DIAG:  Chronic pain of right knee - Plan: PT plan of care cert/re-cert  Difficulty in walking, not elsewhere classified - Plan: PT plan of care cert/re-cert  Stiffness of right knee, not elsewhere classified - Plan: PT plan of care cert/re-cert  ONSET DATE: S/P R TKA on 11/30/2023  SUBJECTIVE:                                                                                                                                                                                            SUBJECTIVE STATEMENT: See Pertinent history.   PERTINENT HISTORY:  S/P R TKA on 11/30/2023. Has only had to 2 PT home health follow up treatments. Sister brought her to PT today. Has been icing her knee and propping it up.   PAIN:  Are you having pain? Yes: NPRS scale: 8/10 currently Pain location: R knee Pain description: stiff and sore Aggravating factors: bending  Relieving factors: rest, ice and elevation.   PRECAUTIONS: Knee  RED FLAGS: Bowel or bladder incontinence: No and Cauda equina syndrome: No   WEIGHT BEARING RESTRICTIONS: WBAT  FALLS:  Has patient fallen in last 6 months? No  LIVING ENVIRONMENT: Lives with: sister Lives in: House/apartment Stairs: No Has following equipment at home: Environmental consultant - 2 wheeled  OCCUPATION: Retired  PLOF: Mod I with gait using SPC  PATIENT GOALS: be able to stand up and walk.   NEXT MD VISIT: 01/09/2024  OBJECTIVE:  Note: Objective measures were completed at Evaluation unless otherwise noted.  DIAGNOSTIC FINDINGS:    PATIENT SURVEYS:  LEFS  Extreme difficulty/unable (0), Quite a bit of difficulty (1), Moderate difficulty (2), Little difficulty (3), No difficulty (4) Survey date:  12/28/2023  Any of your usual work, housework or school activities   2. Usual hobbies, recreational or sporting activities   3. Getting into/out of the bath   4. Walking between rooms   5. Putting on socks/shoes   6. Squatting    7. Lifting an object, like a bag of groceries from the floor   8. Performing light activities around your home   9. Performing heavy activities around your home   10. Getting into/out of a car   11. Walking 2 blocks   12. Walking 1 mile   13. Going up/down 10 stairs (1 flight)   14. Standing for 1 hour   15.  sitting for 1 hour   16. Running on even  ground   17. Running on  uneven ground   18. Making sharp turns while running fast   19. Hopping    20. Rolling over in bed   Score total:  7/80     COGNITION: Overall cognitive status: Within functional limits for tasks assessed     SENSATION: Numbness R lateral knee  MUSCLE LENGTH:  POSTURE: decreased R knee extension in standing, B UE assist at rw.   PALPATION: TTP R knee (medial, lateral, anterior and posterior) Decreased fascial mobility anterior R knee.    LOWER EXTREMITY ROM:     Active  Right eval Left eval  Hip flexion    Hip extension    Hip abduction    Hip adduction    Hip internal rotation    Hip external rotation    Knee flexion 56 (60 AAROM, stiff end feel)   Knee extension -24 (-15 AAROM), stiff end feel   Ankle dorsiflexion    Ankle plantarflexion    Ankle inversion    Ankle eversion     (Blank rows = not tested)  LOWER EXTREMITY MMT:    MMT Right eval Left eval  Hip flexion    Hip extension    Hip abduction    Hip adduction    Hip internal rotation    Hip external rotation    Knee flexion 3+   Knee extension 4   Ankle dorsiflexion    Ankle plantarflexion    Ankle inversion    Ankle eversion     (Blank rows = not tested)  LUMBAR SPECIAL TESTS:    FUNCTIONAL TESTS:    GAIT: Distance walked: 40 ft Assistive device utilized: Environmental consultant - 2 wheeled Level of assistance: Modified independence Comments: antalgic, decreased stance R LE, step to gait pattern, no knee flexion during swing phase of gait, decreased knee extension during stance phase of gait  TREATMENT DATE: 12/28/2023                                                                                                                               Finished S/P week 4 on 12/28/2023  R knee surgical incision healing satisfactorily.   Manual therapy   Seated STM R anterior knee to improve fascial mobility   Reclined with R leg propped on a pillow  STM R hamstrings to decrease  tension and improve knee extension ROM    Therapeutic exercise  Seated R knee flexion AAROM 10x3  Improved R knee flexion AROM to 65 degrees afterwards   Reclined quad set 10x3 with 5 second holds  Gait with rw with emphasis with R knee flexion during swing phase and knee extension during stance phase 40 ft   Improved exercise technique, movement at target joints, use of target muscles after mod verbal, visual, tactile cues.          PATIENT EDUCATION:  Education details: there-ex, HEP, POC Person educated: Patient Education method: Explanation, Demonstration, Tactile cues, Verbal cues, and Handouts Education  comprehension: verbalized understanding and returned demonstration  HOME EXERCISE PROGRAM: Access Code: [email or web address] URL: [email or web address] Date: 12/28/2023 Prepared by: Emil Glassman  Exercises - Supine Quad Set  - 3 x daily - 7 x weekly - 3 sets - 10 reps - 5 seconds hold - Seated Knee Flexion AAROM  - 3 x daily - 7 x weekly - 3 sets - 10 reps - 5 seconds hold  ASSESSMENT:  CLINICAL IMPRESSION: Patient is a 73 y.o. female who was seen today for physical therapy evaluation and treatment for S/P R TKA on 11/30/2023. Pt is currently 4 weeks post op. She currently demonstrates altered gait pattern and posture, R knee pain and stiffness, decrease fascial mobility R anterior knee, R hamstrings muscle tension, TTP, decreased sensation to light touch R lateral knee, decreased R knee strength, and difficulty performing tasks which involve bending and straightening her knee, standing, transfers and walking secondary to knee pain, weakness, and limited ROM. Pt will benefit from skilled physical therapy services to address the aforementioned deficits.      OBJECTIVE IMPAIRMENTS: Abnormal gait, decreased balance, difficulty walking, decreased ROM, decreased strength, improper body mechanics, postural dysfunction, and pain.   ACTIVITY LIMITATIONS: carrying,  lifting, bending, standing, squatting, stairs, transfers, bed mobility, dressing, and locomotion level  PARTICIPATION LIMITATIONS:   PERSONAL FACTORS: Age, Fitness, Past/current experiences, Time since onset of injury/illness/exacerbation, and 3+ comorbidities: arthritis, hx of DVT 2013, dyspnea, HTN, PVD, PE 2013 are also affecting patient's functional outcome.   REHAB POTENTIAL: Fair    CLINICAL DECISION MAKING: Stable/uncomplicated  EVALUATION COMPLEXITY: Low   GOALS: Goals reviewed with patient? Yes  SHORT TERM GOALS: Target date: 01/06/2024  Pt will be independent with her initial HEP to improve R knee ROM, strength, and ability to ambulate and perform transfers with less difficulty.  Baseline: Pt has started her initial HEP (12/28/2023) Goal status: INITIAL    LONG TERM GOALS: Target date: 02/24/2024  Pt will be able to ambulate at least 500 ft without AD and no LOB to promote mobility.  Baseline: Pt currently ambulating with rw. (12/28/2023) Goal status: INITIAL  2.  Pt will improve R knee flexion AROM to 115 degrees or more and R knee extension AROM to -5 degrees or more to promote ability to ambulate, perform standing tasks, perform transfers and negotiate stairs with less difficulty.  Baseline:  Active  Right eval  Knee flexion 56 (60 AAROM, stiff end feel)  Knee extension -24 (-15 AAROM), stiff end feel   Goal status: INITIAL  3.  Pt will improve her R knee flexion and extension strength by at least 1/2 MMT grade to promote ability to ambulate, perform standing tasks with less difficulty.  Baseline:  MMT Right eval  Knee flexion 3+  Knee extension 4   Goal status: INITIAL  4.  Pt will improve her LEFS score by at least 20 points as a demonstration of improved function.  Baseline: 7/80 (12/28/2023) Goal status: INITIAL   PLAN:  PT FREQUENCY: 1-2x/week  PT DURATION: 8 weeks  PLANNED INTERVENTIONS: 97110-Therapeutic exercises, 97530- Therapeutic activity,  97112- Neuromuscular re-education, 97535- Self Care, [id number]- Manual therapy, [phone]- Gait training, [phone]- Electrical stimulation (unattended), [phone]- Ionotophoresis 4mg /ml Dexamethasone , Patient/Family education, Balance training, Joint mobilization, and Scar mobilization.  PLAN FOR NEXT SESSION: knee ROM, strengthening, gait, function, manual techniques, modalities PRN   Eulonda Andalon, PT, DPT 12/28/2023, 12:12 PM

## 2023-12-30 ENCOUNTER — Ambulatory Visit: Payer: Self-pay

## 2023-12-30 ENCOUNTER — Telehealth: Payer: Self-pay | Admitting: Orthopedic Surgery

## 2023-12-30 ENCOUNTER — Other Ambulatory Visit: Payer: Self-pay

## 2023-12-30 ENCOUNTER — Other Ambulatory Visit: Payer: Self-pay | Admitting: Orthopedic Surgery

## 2023-12-30 DIAGNOSIS — G8929 Other chronic pain: Secondary | ICD-10-CM

## 2023-12-30 DIAGNOSIS — M25661 Stiffness of right knee, not elsewhere classified: Secondary | ICD-10-CM

## 2023-12-30 DIAGNOSIS — R262 Difficulty in walking, not elsewhere classified: Secondary | ICD-10-CM

## 2023-12-30 MED ORDER — OXYCODONE-ACETAMINOPHEN 10-325 MG PO TABS
1.0000 | ORAL_TABLET | ORAL | 0 refills | Status: DC | PRN
  Filled 2023-12-30: qty 20, 4d supply, fill #0

## 2023-12-30 NOTE — Therapy (Signed)
 OUTPATIENT PHYSICAL THERAPY  TREATMENT   Patient Name: Leah Roberts MRN: [id number] DOB:23-Mar-1951, 73 y.o., female Today's Date: 12/30/2023  END OF SESSION:  PT End of Session - 12/30/23 1037     Visit Number 2    Number of Visits 17    Date for PT Re-Evaluation 02/24/24    PT Start Time 1037    PT Stop Time 1121    PT Time Calculation (min) 44 min    Activity Tolerance Patient tolerated treatment well;Patient limited by pain    Behavior During Therapy [facility] for tasks assessed/performed           Past Medical History:  Diagnosis Date   Arthritis    Complication of anesthesia    Hard to wake up   DVT (deep venous thrombosis) (HCC) 2013   Dyspnea    due to knee pain   GERD (gastroesophageal reflux disease)    History of blood transfusion    History of hypertension    Hypertension    Peripheral vascular disease (HCC)    DVT   Pre-diabetes    Pulmonary embolism (HCC) 2013   Past Surgical History:  Procedure Laterality Date   APPENDECTOMY     PULMONARY THROMBECTOMY Bilateral 01/01/2022   Procedure: PULMONARY THROMBECTOMY;  Surgeon: Jama Cordella MATSU, MD;  Location: [facility] INVASIVE CV LAB;  Service: Cardiovascular;  Laterality: Bilateral;   TONSILLECTOMY AND ADENOIDECTOMY     TOTAL KNEE ARTHROPLASTY Left 12/09/2021   Procedure: LEFT TOTAL KNEE ARTHROPLASTY;  Surgeon: Harden Jerona GAILS, MD;  Location: [facility] OR;  Service: Orthopedics;  Laterality: Left;   TOTAL KNEE ARTHROPLASTY Right 11/30/2023   Procedure: ARTHROPLASTY, KNEE, TOTAL;  Surgeon: Harden Jerona GAILS, MD;  Location: [facility] OR;  Service: Orthopedics;  Laterality: Right;   Patient Active Problem List   Diagnosis Date Noted   S/P total knee replacement, right 12/04/2023   Total knee replacement status, right 11/30/2023   Unilateral primary osteoarthritis, right knee 11/30/2023   Anticoagulated 11/23/2023   Burn 06/27/2023   Hemorrhoid 03/23/2023   RVF (right ventricular failure) (HCC) 01/12/2023   Atopic dermatitis  01/12/2023   Shortness of breath 08/29/2022   Encounter for screening mammogram for malignant neoplasm of breast 08/29/2022   Right groin pain 01/13/2022   Bilateral pulmonary embolism with right heart strain RV/LV ratio 1:5 (HCC) 12/31/2021   Acute respiratory failure with hypoxia (HCC) 12/31/2021   Total knee replacement status, left 12/09/21 12/09/2021   Unilateral primary osteoarthritis, left knee    Shoulder disorder 09/03/2021   Arthritis of knee 09/03/2021   Lumbar radiculopathy 09/03/2021   Abnormal MRI, knee 09/03/2021   Chronic pain of right knee 09/03/2021   Bronchitis 03/25/2021   Anemia 01/09/2020   HTN (hypertension) 12/31/2019   Chest pain 12/31/2019   DOE (dyspnea on exertion) 12/31/2019   Acute coronary syndrome (HCC) 12/31/2019   Unstable angina (HCC) 12/31/2019   Atherosclerosis 08/23/2018   Screening for tuberculosis 07/12/2018   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   Arthralgia 07/12/2018   Solitary pulmonary nodule 07/12/2018   Neck pain 02/15/2018   Dizziness 02/15/2018   Elevated TSH 02/15/2018   Screening for HIV (human immunodeficiency virus) 10/05/2017   Neuropathy 10/05/2017   Screening for colon cancer 10/05/2017   Hyperlipidemia 08/04/2016   Prediabetes 08/04/2016   Back pain 08/04/2016   Bilateral knee pain 06/18/2016   History of DVT of lower extremity 04/01/2014   History of DVT and PE (deep vein thrombosis) 05/04/2011    PCP: Dineen Rollene MATSU,  FNP   REFERRING PROVIDER: Harden Jerona GAILS, MD  REFERRING DIAG: Diagnosis [phone] (ICD-10-CM) - S/P total knee replacement, right  Rationale for Evaluation and Treatment: Rehabilitation  THERAPY DIAG:  Chronic pain of right knee  Difficulty in walking, not elsewhere classified  Stiffness of right knee, not elsewhere classified  ONSET DATE: S/P R TKA on 11/30/2023  SUBJECTIVE:                                                                                                                                                                                            SUBJECTIVE STATEMENT: R knee is very stiff. 8/10 pain and stiffness currently.   PERTINENT HISTORY:  S/P R TKA on 11/30/2023. Has only had to 2 PT home health follow up treatments. Sister brought her to PT today. Has been icing her knee and propping it up.   PAIN:  Are you having pain? Yes: NPRS scale: /10 currently Pain location: R knee Pain description: stiff and sore Aggravating factors: bending  Relieving factors: rest, ice and elevation.   PRECAUTIONS: Knee  RED FLAGS: Bowel or bladder incontinence: No and Cauda equina syndrome: No   WEIGHT BEARING RESTRICTIONS: WBAT  FALLS:  Has patient fallen in last 6 months? No  LIVING ENVIRONMENT: Lives with: sister Lives in: House/apartment Stairs: No Has following equipment at home: Environmental consultant - 2 wheeled  OCCUPATION: Retired  PLOF: Mod I with gait using SPC  PATIENT GOALS: be able to stand up and walk.   NEXT MD VISIT: 01/09/2024  OBJECTIVE:  Note: Objective measures were completed at Evaluation unless otherwise noted.  DIAGNOSTIC FINDINGS:    PATIENT SURVEYS:  LEFS  Extreme difficulty/unable (0), Quite a bit of difficulty (1), Moderate difficulty (2), Little difficulty (3), No difficulty (4) Survey date:  12/28/2023  Any of your usual work, housework or school activities   2. Usual hobbies, recreational or sporting activities   3. Getting into/out of the bath   4. Walking between rooms   5. Putting on socks/shoes   6. Squatting    7. Lifting an object, like a bag of groceries from the floor   8. Performing light activities around your home   9. Performing heavy activities around your home   10. Getting into/out of a car   11. Walking 2 blocks   12. Walking 1 mile   13. Going up/down 10 stairs (1 flight)   14. Standing for 1 hour   15.  sitting for 1 hour   16. Running on even ground   17. Running on uneven ground   18. Making sharp turns while running fast  19. Hopping    20. Rolling over in bed   Score total:  7/80     COGNITION: Overall cognitive status: Within functional limits for tasks assessed     SENSATION: Numbness R lateral knee  MUSCLE LENGTH:  POSTURE: decreased R knee extension in standing, B UE assist at rw.   PALPATION: TTP R knee (medial, lateral, anterior and posterior) Decreased fascial mobility anterior R knee.    LOWER EXTREMITY ROM:     Active  Right eval Left eval  Hip flexion    Hip extension    Hip abduction    Hip adduction    Hip internal rotation    Hip external rotation    Knee flexion 56 (60 AAROM, stiff end feel)   Knee extension -24 (-15 AAROM), stiff end feel   Ankle dorsiflexion    Ankle plantarflexion    Ankle inversion    Ankle eversion     (Blank rows = not tested)  LOWER EXTREMITY MMT:    MMT Right eval Left eval  Hip flexion    Hip extension    Hip abduction    Hip adduction    Hip internal rotation    Hip external rotation    Knee flexion 3+   Knee extension 4   Ankle dorsiflexion    Ankle plantarflexion    Ankle inversion    Ankle eversion     (Blank rows = not tested)  LUMBAR SPECIAL TESTS:    FUNCTIONAL TESTS:    GAIT: Distance walked: 40 ft Assistive device utilized: Environmental consultant - 2 wheeled Level of assistance: Modified independence Comments: antalgic, decreased stance R LE, step to gait pattern, no knee flexion during swing phase of gait, decreased knee extension during stance phase of gait  TREATMENT DATE: 12/30/2023                                                                                                                               Finished S/P week 4 on 12/28/2023  R knee surgical incision healing satisfactorily.   Manual therapy   Seated STM R anterior knee to improve fascial mobility  Reclined with R leg propped on a pillow  STM R hamstrings to decrease tension and improve knee extension ROM   Therapeutic exercise  Seated R knee  flexion AAROM 10x for 5 sets  Improved R knee flexion AROM to 66 degrees afterwards  Reclined quad set 10x3 with 5 second holds  Seated R ankle DF/PF 10x3 with foot on floor.   To promote blood flow and gentle knee movement.   Gait with rw with emphasis with R knee flexion during swing phase and knee extension during stance phase 40 ft  Standing hip and knee flexion onto first stair step with B UE assist to promote knee flexion ROM 5x.   Gait with rw with emphasis with R knee flexion during swing phase and knee extension during stance phase 42 ft  Improved exercise technique, movement at target joints, use of target muscles after mod verbal, visual, tactile cues.          PATIENT EDUCATION:  Education details: there-ex, HEP, POC Person educated: Patient Education method: Explanation, Demonstration, Tactile cues, Verbal cues, and Handouts Education comprehension: verbalized understanding and returned demonstration  HOME EXERCISE PROGRAM: Access Code: [email or web address] URL: [email or web address] Date: 12/28/2023 Prepared by: Emil Glassman  Exercises - Supine Quad Set  - 3 x daily - 7 x weekly - 3 sets - 10 reps - 5 seconds hold - Seated Knee Flexion AAROM  - 3 x daily - 7 x weekly - 3 sets - 10 reps - 5 seconds hold - Seated Heel Toe Raises  - 3 x daily - 7 x weekly - 3 sets - 10 reps     ASSESSMENT:  CLINICAL IMPRESSION:  Worked on improving fascial mobility anterior knee as well as decreasing hamstrings muscle tension to promote knee flexion and extension ROM. Continued working and knee flexion and extension ROM exercises secondary to stiffness. Also worked on ambulation with emphasis on knee flexion during swing phase and knee extension during stance phase to help maintain ROM gains. Fair tolerance to today's session, limited by pain. Pt will benefit from continued skilled physical therapy services to improve ROM, strength, function, and ability to ambulate with  less difficulty.       OBJECTIVE IMPAIRMENTS: Abnormal gait, decreased balance, difficulty walking, decreased ROM, decreased strength, improper body mechanics, postural dysfunction, and pain.   ACTIVITY LIMITATIONS: carrying, lifting, bending, standing, squatting, stairs, transfers, bed mobility, dressing, and locomotion level  PARTICIPATION LIMITATIONS:   PERSONAL FACTORS: Age, Fitness, Past/current experiences, Time since onset of injury/illness/exacerbation, and 3+ comorbidities: arthritis, hx of DVT 2013, dyspnea, HTN, PVD, PE 2013 are also affecting patient's functional outcome.   REHAB POTENTIAL: Fair    CLINICAL DECISION MAKING: Stable/uncomplicated  EVALUATION COMPLEXITY: Low   GOALS: Goals reviewed with patient? Yes  SHORT TERM GOALS: Target date: 01/06/2024  Pt will be independent with her initial HEP to improve R knee ROM, strength, and ability to ambulate and perform transfers with less difficulty.  Baseline: Pt has started her initial HEP (12/28/2023) Goal status: INITIAL    LONG TERM GOALS: Target date: 02/24/2024  Pt will be able to ambulate at least 500 ft without AD and no LOB to promote mobility.  Baseline: Pt currently ambulating with rw. (12/28/2023) Goal status: INITIAL  2.  Pt will improve R knee flexion AROM to 115 degrees or more and R knee extension AROM to -5 degrees or more to promote ability to ambulate, perform standing tasks, perform transfers and negotiate stairs with less difficulty.  Baseline:  Active  Right eval  Knee flexion 56 (60 AAROM, stiff end feel)  Knee extension -24 (-15 AAROM), stiff end feel   Goal status: INITIAL  3.  Pt will improve her R knee flexion and extension strength by at least 1/2 MMT grade to promote ability to ambulate, perform standing tasks with less difficulty.  Baseline:  MMT Right eval  Knee flexion 3+  Knee extension 4   Goal status: INITIAL  4.  Pt will improve her LEFS score by at least 20 points as a  demonstration of improved function.  Baseline: 7/80 (12/28/2023) Goal status: INITIAL   PLAN:  PT FREQUENCY: 1-2x/week  PT DURATION: 8 weeks  PLANNED INTERVENTIONS: 97110-Therapeutic exercises, 97530- Therapeutic activity, 97112- Neuromuscular re-education, 97535- Self Care, [id number]- Manual therapy, [id number]- Gait training, [phone]- Electrical stimulation (  unattended), [phone]- Ionotophoresis 4mg /ml Dexamethasone , Patient/Family education, Balance training, Joint mobilization, and Scar mobilization.  PLAN FOR NEXT SESSION: knee ROM, strengthening, gait, function, manual techniques, modalities PRN   Mega Kinkade, PT, DPT 12/30/2023, 11:34 AM

## 2023-12-30 NOTE — Telephone Encounter (Signed)
 Patient called and said she needs a refill on pain medication. CB#[phone]

## 2023-12-30 NOTE — Telephone Encounter (Signed)
 I called patient and advised.

## 2024-01-03 ENCOUNTER — Ambulatory Visit: Payer: Self-pay | Attending: Orthopedic Surgery

## 2024-01-03 DIAGNOSIS — R262 Difficulty in walking, not elsewhere classified: Secondary | ICD-10-CM | POA: Insufficient documentation

## 2024-01-03 DIAGNOSIS — M25661 Stiffness of right knee, not elsewhere classified: Secondary | ICD-10-CM | POA: Insufficient documentation

## 2024-01-03 DIAGNOSIS — M25561 Pain in right knee: Secondary | ICD-10-CM | POA: Insufficient documentation

## 2024-01-03 DIAGNOSIS — G8929 Other chronic pain: Secondary | ICD-10-CM | POA: Insufficient documentation

## 2024-01-03 NOTE — Therapy (Signed)
 OUTPATIENT PHYSICAL THERAPY  TREATMENT   Patient Name: Leah Roberts MRN: [id number] DOB:January 16, 1951, 73 y.o., female Today's Date: 01/03/2024  END OF SESSION:  PT End of Session - 01/03/24 1522     Visit Number 3    Number of Visits 17    Date for PT Re-Evaluation 02/24/24    PT Start Time 1522    PT Stop Time 1603    PT Time Calculation (min) 41 min    Activity Tolerance Patient tolerated treatment well;Patient limited by pain    Behavior During Therapy [facility] for tasks assessed/performed            Past Medical History:  Diagnosis Date   Arthritis    Complication of anesthesia    Hard to wake up   DVT (deep venous thrombosis) (HCC) 2013   Dyspnea    due to knee pain   GERD (gastroesophageal reflux disease)    History of blood transfusion    History of hypertension    Hypertension    Peripheral vascular disease (HCC)    DVT   Pre-diabetes    Pulmonary embolism (HCC) 2013   Past Surgical History:  Procedure Laterality Date   APPENDECTOMY     PULMONARY THROMBECTOMY Bilateral 01/01/2022   Procedure: PULMONARY THROMBECTOMY;  Surgeon: Jama Cordella MATSU, MD;  Location: [facility] INVASIVE CV LAB;  Service: Cardiovascular;  Laterality: Bilateral;   TONSILLECTOMY AND ADENOIDECTOMY     TOTAL KNEE ARTHROPLASTY Left 12/09/2021   Procedure: LEFT TOTAL KNEE ARTHROPLASTY;  Surgeon: Harden Jerona GAILS, MD;  Location: [facility] OR;  Service: Orthopedics;  Laterality: Left;   TOTAL KNEE ARTHROPLASTY Right 11/30/2023   Procedure: ARTHROPLASTY, KNEE, TOTAL;  Surgeon: Harden Jerona GAILS, MD;  Location: [facility] OR;  Service: Orthopedics;  Laterality: Right;   Patient Active Problem List   Diagnosis Date Noted   S/P total knee replacement, right 12/04/2023   Total knee replacement status, right 11/30/2023   Unilateral primary osteoarthritis, right knee 11/30/2023   Anticoagulated 11/23/2023   Burn 06/27/2023   Hemorrhoid 03/23/2023   RVF (right ventricular failure) (HCC) 01/12/2023   Atopic dermatitis  01/12/2023   Shortness of breath 08/29/2022   Encounter for screening mammogram for malignant neoplasm of breast 08/29/2022   Right groin pain 01/13/2022   Bilateral pulmonary embolism with right heart strain RV/LV ratio 1:5 (HCC) 12/31/2021   Acute respiratory failure with hypoxia (HCC) 12/31/2021   Total knee replacement status, left 12/09/21 12/09/2021   Unilateral primary osteoarthritis, left knee    Shoulder disorder 09/03/2021   Arthritis of knee 09/03/2021   Lumbar radiculopathy 09/03/2021   Abnormal MRI, knee 09/03/2021   Chronic pain of right knee 09/03/2021   Bronchitis 03/25/2021   Anemia 01/09/2020   HTN (hypertension) 12/31/2019   Chest pain 12/31/2019   DOE (dyspnea on exertion) 12/31/2019   Acute coronary syndrome (HCC) 12/31/2019   Unstable angina (HCC) 12/31/2019   Atherosclerosis 08/23/2018   Screening for tuberculosis 07/12/2018   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   Arthralgia 07/12/2018   Solitary pulmonary nodule 07/12/2018   Neck pain 02/15/2018   Dizziness 02/15/2018   Elevated TSH 02/15/2018   Screening for HIV (human immunodeficiency virus) 10/05/2017   Neuropathy 10/05/2017   Screening for colon cancer 10/05/2017   Hyperlipidemia 08/04/2016   Prediabetes 08/04/2016   Back pain 08/04/2016   Bilateral knee pain 06/18/2016   History of DVT of lower extremity 04/01/2014   History of DVT and PE (deep vein thrombosis) 05/04/2011    PCP: Dineen Quant  G, FNP   REFERRING PROVIDER: Harden Jerona GAILS, MD  REFERRING DIAG: Diagnosis [id number] (ICD-10-CM) - S/P total knee replacement, right  Rationale for Evaluation and Treatment: Rehabilitation  THERAPY DIAG:  Chronic pain of right knee  Difficulty in walking, not elsewhere classified  Stiffness of right knee, not elsewhere classified  ONSET DATE: S/P R TKA on 11/30/2023  SUBJECTIVE:                                                                                                                                                                                            SUBJECTIVE STATEMENT: R knee is in a lot of pain but getting better. 8/10 R knee pain when walking. Stiff when sitting. Better able to get into and out of a car.     PERTINENT HISTORY:  S/P R TKA on 11/30/2023. Has only had to 2 PT home health follow up treatments. Sister brought her to PT today. Has been icing her knee and propping it up.   PAIN:  Are you having pain? Yes: NPRS scale: /10 currently Pain location: R knee Pain description: stiff and sore Aggravating factors: bending  Relieving factors: rest, ice and elevation.   PRECAUTIONS: Knee  RED FLAGS: Bowel or bladder incontinence: No and Cauda equina syndrome: No   WEIGHT BEARING RESTRICTIONS: WBAT  FALLS:  Has patient fallen in last 6 months? No  LIVING ENVIRONMENT: Lives with: sister Lives in: House/apartment Stairs: No Has following equipment at home: Environmental consultant - 2 wheeled  OCCUPATION: Retired  PLOF: Mod I with gait using SPC  PATIENT GOALS: be able to stand up and walk.   NEXT MD VISIT: 01/09/2024  OBJECTIVE:  Note: Objective measures were completed at Evaluation unless otherwise noted.  DIAGNOSTIC FINDINGS:    PATIENT SURVEYS:  LEFS  Extreme difficulty/unable (0), Quite a bit of difficulty (1), Moderate difficulty (2), Little difficulty (3), No difficulty (4) Survey date:  12/28/2023  Any of your usual work, housework or school activities   2. Usual hobbies, recreational or sporting activities   3. Getting into/out of the bath   4. Walking between rooms   5. Putting on socks/shoes   6. Squatting    7. Lifting an object, like a bag of groceries from the floor   8. Performing light activities around your home   9. Performing heavy activities around your home   10. Getting into/out of a car   11. Walking 2 blocks   12. Walking 1 mile   13. Going up/down 10 stairs (1 flight)   14. Standing for 1 hour   15.  sitting for 1 hour  16. Running on  even ground   17. Running on uneven ground   18. Making sharp turns while running fast   19. Hopping    20. Rolling over in bed   Score total:  7/80     COGNITION: Overall cognitive status: Within functional limits for tasks assessed     SENSATION: Numbness R lateral knee  MUSCLE LENGTH:  POSTURE: decreased R knee extension in standing, B UE assist at rw.   PALPATION: TTP R knee (medial, lateral, anterior and posterior) Decreased fascial mobility anterior R knee.    LOWER EXTREMITY ROM:     Active  Right eval Left eval  Hip flexion    Hip extension    Hip abduction    Hip adduction    Hip internal rotation    Hip external rotation    Knee flexion 56 (60 AAROM, stiff end feel)   Knee extension -24 (-15 AAROM), stiff end feel   Ankle dorsiflexion    Ankle plantarflexion    Ankle inversion    Ankle eversion     (Blank rows = not tested)  LOWER EXTREMITY MMT:    MMT Right eval Left eval  Hip flexion    Hip extension    Hip abduction    Hip adduction    Hip internal rotation    Hip external rotation    Knee flexion 3+   Knee extension 4   Ankle dorsiflexion    Ankle plantarflexion    Ankle inversion    Ankle eversion     (Blank rows = not tested)  LUMBAR SPECIAL TESTS:    FUNCTIONAL TESTS:    GAIT: Distance walked: 40 ft Assistive device utilized: Environmental consultant - 2 wheeled Level of assistance: Modified independence Comments: antalgic, decreased stance R LE, step to gait pattern, no knee flexion during swing phase of gait, decreased knee extension during stance phase of gait  TREATMENT DATE: 01/03/2024                                                                                                                               Finished S/P week 4 on 12/28/2023  R knee surgical incision healing satisfactorily.   Manual therapy   Seated STM R anterior knee to improve fascial mobility  Reclined with R leg propped on a pillow  STM R hamstrings to  decrease tension and improve knee extension ROM   Therapeutic exercise  Seated R knee AROM at start of session   Flexion 55 degrees  Extension -18 degrees  Seated R knee flexion AAROM 10x for 3 sets  Improved R knee flexion AROM to 65 degrees afterwards  Reclined quad set 10x3 with 5 second holds  Sit <> reclined position 1x each. Able to perform without manual assist today.    Improved exercise technique, movement at target joints, use of target muscles after mod verbal, visual, tactile cues.    Gait training  Gait with  rw with emphasis with R knee flexion during swing phase, ankle DF during heel strike, and knee extension during stance phase 40 ft  Standing hip and knee flexion onto first stair step with B UE assist to promote knee flexion ROM 10x.   Gait with rw with emphasis with R knee flexion during swing phase, ankle DF during heel strike, and knee extension during stance phase 30 ft    Improved technique, movement at target joints, use of target muscles after mod verbal, visual, tactile cues.          PATIENT EDUCATION:  Education details: there-ex, HEP, POC Person educated: Patient Education method: Explanation, Demonstration, Tactile cues, Verbal cues, and Handouts Education comprehension: verbalized understanding and returned demonstration  HOME EXERCISE PROGRAM: Access Code: [email or web address] URL: [email or web address] Date: 12/28/2023 Prepared by: Emil Glassman  Exercises - Supine Quad Set  - 3 x daily - 7 x weekly - 3 sets - 10 reps - 5 seconds hold - Seated Knee Flexion AAROM  - 3 x daily - 7 x weekly - 3 sets - 10 reps - 5 seconds hold - Seated Heel Toe Raises  - 3 x daily - 7 x weekly - 3 sets - 10 reps     ASSESSMENT:  CLINICAL IMPRESSION:  Improved R LE gait patten during swing phase, heel strike and stance phase after cues for increasing knee flexion, ankle DF, and knee extension respectively after after ROM exercises. Continued  working on improving fascial mobility anterior knee as well as decreasing hamstrings muscle tension to promote knee flexion and extension ROM. Continued working and knee flexion and extension ROM exercises secondary to stiffness. Pt tolerated session well without aggravation of symptoms.  Pt will benefit from continued skilled physical therapy services to improve ROM, strength, function, and ability to ambulate with less difficulty.       OBJECTIVE IMPAIRMENTS: Abnormal gait, decreased balance, difficulty walking, decreased ROM, decreased strength, improper body mechanics, postural dysfunction, and pain.   ACTIVITY LIMITATIONS: carrying, lifting, bending, standing, squatting, stairs, transfers, bed mobility, dressing, and locomotion level  PARTICIPATION LIMITATIONS:   PERSONAL FACTORS: Age, Fitness, Past/current experiences, Time since onset of injury/illness/exacerbation, and 3+ comorbidities: arthritis, hx of DVT 2013, dyspnea, HTN, PVD, PE 2013 are also affecting patient's functional outcome.   REHAB POTENTIAL: Fair    CLINICAL DECISION MAKING: Stable/uncomplicated  EVALUATION COMPLEXITY: Low   GOALS: Goals reviewed with patient? Yes  SHORT TERM GOALS: Target date: 01/06/2024  Pt will be independent with her initial HEP to improve R knee ROM, strength, and ability to ambulate and perform transfers with less difficulty.  Baseline: Pt has started her initial HEP (12/28/2023) Goal status: INITIAL    LONG TERM GOALS: Target date: 02/24/2024  Pt will be able to ambulate at least 500 ft without AD and no LOB to promote mobility.  Baseline: Pt currently ambulating with rw. (12/28/2023) Goal status: INITIAL  2.  Pt will improve R knee flexion AROM to 115 degrees or more and R knee extension AROM to -5 degrees or more to promote ability to ambulate, perform standing tasks, perform transfers and negotiate stairs with less difficulty.  Baseline:  Active  Right eval  Knee flexion 56 (60  AAROM, stiff end feel)  Knee extension -24 (-15 AAROM), stiff end feel   Goal status: INITIAL  3.  Pt will improve her R knee flexion and extension strength by at least 1/2 MMT grade to promote ability to ambulate, perform standing tasks with less difficulty.  Baseline:  MMT Right eval  Knee flexion 3+  Knee extension 4   Goal status: INITIAL  4.  Pt will improve her LEFS score by at least 20 points as a demonstration of improved function.  Baseline: 7/80 (12/28/2023) Goal status: INITIAL   PLAN:  PT FREQUENCY: 1-2x/week  PT DURATION: 8 weeks  PLANNED INTERVENTIONS: 97110-Therapeutic exercises, 97530- Therapeutic activity, 97112- Neuromuscular re-education, 97535- Self Care, 02859- Manual therapy, [phone]- Gait training, [phone]- Electrical stimulation (unattended), [phone]- Ionotophoresis 4mg /ml Dexamethasone , Patient/Family education, Balance training, Joint mobilization, and Scar mobilization.  PLAN FOR NEXT SESSION: knee ROM, strengthening, gait, function, manual techniques, modalities PRN   [place] Churchill, PT, DPT 01/03/2024, 4:17 PM

## 2024-01-09 ENCOUNTER — Encounter: Admitting: Physician Assistant

## 2024-01-09 ENCOUNTER — Telehealth: Payer: Self-pay | Admitting: Orthopedic Surgery

## 2024-01-09 NOTE — Telephone Encounter (Signed)
 Patient called. She would like a refill on Oxycodone 

## 2024-01-09 NOTE — Telephone Encounter (Signed)
 Patient is s/p a right total knee replacement 11/30/2023 requesting refill of oxycodone  last refill was 12/24/2023 #20 please advise.

## 2024-01-10 ENCOUNTER — Other Ambulatory Visit: Payer: Self-pay | Admitting: Orthopedic Surgery

## 2024-01-10 ENCOUNTER — Other Ambulatory Visit: Payer: Self-pay

## 2024-01-10 MED FILL — Rosuvastatin Calcium Tab 5 MG: ORAL | 90 days supply | Qty: 90 | Fill #3 | Status: AC

## 2024-01-10 MED FILL — Apixaban Tab 5 MG: ORAL | 30 days supply | Qty: 60 | Fill #2 | Status: CN

## 2024-01-10 MED FILL — Rosuvastatin Calcium Tab 5 MG: ORAL | 90 days supply | Qty: 90 | Fill #3 | Status: CN

## 2024-01-10 NOTE — Telephone Encounter (Unsigned)
 Copied from CRM [id number]. Topic: Clinical - Prescription Issue >> Jan 10, 2024  3:13 PM Mercedes MATSU wrote: Reason for CRM: [facility] called and stated that the Patients medication has been sent to the office via mail, and that someone signed for it. They want to know if the medication is still at the clinic, and if and when can the patient come and pick up their package. Good call back number is [phone] for the patient and for [facility] it is [phone] [facility].

## 2024-01-11 ENCOUNTER — Telehealth: Payer: Self-pay

## 2024-01-11 ENCOUNTER — Other Ambulatory Visit: Payer: Self-pay

## 2024-01-11 ENCOUNTER — Encounter: Payer: Self-pay | Admitting: Physician Assistant

## 2024-01-11 ENCOUNTER — Ambulatory Visit (INDEPENDENT_AMBULATORY_CARE_PROVIDER_SITE_OTHER): Payer: Self-pay | Admitting: Physician Assistant

## 2024-01-11 ENCOUNTER — Other Ambulatory Visit: Payer: Self-pay | Admitting: Physician Assistant

## 2024-01-11 DIAGNOSIS — Z96651 Presence of right artificial knee joint: Secondary | ICD-10-CM

## 2024-01-11 MED ORDER — OXYCODONE-ACETAMINOPHEN 10-325 MG PO TABS
1.0000 | ORAL_TABLET | ORAL | 0 refills | Status: DC | PRN
  Filled 2024-01-11: qty 20, 4d supply, fill #0

## 2024-01-11 MED ORDER — METHOCARBAMOL 500 MG PO TABS
500.0000 mg | ORAL_TABLET | Freq: Four times a day (QID) | ORAL | 1 refills | Status: DC
  Filled 2024-01-11: qty 40, 10d supply, fill #0
  Filled 2024-01-31: qty 40, 10d supply, fill #1

## 2024-01-11 MED ORDER — OXYCODONE-ACETAMINOPHEN 5-325 MG PO TABS
1.0000 | ORAL_TABLET | Freq: Three times a day (TID) | ORAL | 0 refills | Status: DC | PRN
  Filled 2024-01-11: qty 30, 10d supply, fill #0

## 2024-01-11 NOTE — Telephone Encounter (Signed)
 Spoke to the pharmacist and then them know she picked up 3 bottles on 8/26. She stated she would document that information.

## 2024-01-11 NOTE — Progress Notes (Signed)
 Office Visit Note   Patient: Leah Roberts           Date of Birth: [birth year]           MRN: [id number] Visit Date: 01/11/2024              Requested by: Dineen Rollene MATSU, FNP [address] [place],  [place] [address] PCP: Dineen Rollene MATSU, FNP  Chief Complaint  Patient presents with   Right Knee - Routine Post Op    11/30/2023 right total knee replacement       HPI: Patient is a 73 year old woman who is status post right total knee arthroplasty 11/30/23.  She is in PT working on ROM.  She states she continue to use ice and seated elevation for the edema.  She ran out of pain medication and is requesting more.  She states her foot feels like she is walking on a cushion.    Assessment & Plan: Visit Diagnoses:  1. S/P total knee replacement, right     Plan: Continue to work with PT.  Elevate to decrease edema Ice PRN for pain and swelling.  I refilled her Robaxin  and Erin had sent a narcotic refill in earlier today.    Follow-Up Instructions: No follow-ups on file.   Ortho Exam  Patient is alert, oriented, no adenopathy, well-dressed, normal affect, normal respiratory effort. Right knee incision healing well, edema in the Leg and foot.  Full knee extension with 85 degrees of flexion.       Imaging: No results found. No images are attached to the encounter.  Labs: Lab Results  Component Value Date   HGBA1C 6.5 09/22/2023   HGBA1C 6.1 (A) 01/12/2023   HGBA1C 6.7 (H) 05/19/2022   ESRSEDRATE 10 05/29/2020   CRP <1.0 05/29/2020     Lab Results  Component Value Date   ALBUMIN  4.0 03/08/2022   ALBUMIN  3.7 01/13/2022   ALBUMIN  3.7 12/30/2021    Lab Results  Component Value Date   MG 2.0 01/04/2022   MG 2.0 01/03/2022   MG 1.8 01/02/2022   Lab Results  Component Value Date   VD25OH 21.43 (L) 10/05/2017    No results found for: PREALBUMIN    Latest Ref Rng & Units 11/24/2023    2:07 PM 06/17/2023    2:02 PM 09/22/2022    2:52 PM  CBC  EXTENDED  WBC 4.0 - 10.5 K/uL 6.7  6.5  6.3   RBC 3.87 - 5.11 MIL/uL 4.59  4.53  4.52   Hemoglobin 12.0 - 15.0 g/dL 87.4  87.5  87.5   HCT 36.0 - 46.0 % 39.8  38.8  38.3   Platelets 150 - 400 K/uL 236  239  243      There is no height or weight on file to calculate BMI.  Orders:  No orders of the defined types were placed in this encounter.  Meds ordered this encounter  Medications   oxyCODONE -acetaminophen  (PERCOCET/ROXICET) 5-325 MG tablet    Sig: Take 1 tablet by mouth every 8 (eight) hours as needed.    Dispense:  30 tablet    Refill:  0    Supervising Provider:   DUDA, MARCUS V [[id number]]   methocarbamol  (ROBAXIN ) 500 MG tablet    Sig: Take 1 tablet (500 mg total) by mouth 4 (four) times daily.    Dispense:  40 tablet    Refill:  1    Supervising Provider:   DUDA, MARCUS V [[id number]]  Procedures: No procedures performed  Clinical Data: No additional findings.  ROS:  All other systems negative, except as noted in the HPI. Review of Systems  Objective: Vital Signs: There were no vitals taken for this visit.  Specialty Comments:  MRI LUMBAR SPINE WITHOUT CONTRAST   TECHNIQUE: Multiplanar, multisequence MR imaging of the lumbar spine was performed. No intravenous contrast was administered.   COMPARISON:  Lumbar spine radiographs 01/27/2023   FINDINGS: Segmentation:  Standard.   Alignment:  3 mm anterolisthesis of L4 on L5.   Vertebrae: No fracture or suspicious marrow lesion. Mild degenerative facet edema at L4-5.   Conus medullaris and cauda equina: Conus extends to the L1 level. Conus and cauda equina appear normal.   Paraspinal and other soft tissues: Unremarkable.   Disc levels:   T12-L1: Minimal disc bulging, small left central disc protrusion, and mild right and moderate left facet and ligamentum flavum hypertrophy without stenosis.   L1-2: Minimal disc bulging, a shallow left paracentral to left subarticular disc protrusion, and moderate facet  and ligamentum flavum hypertrophy without stenosis.   L2-3: Mild disc bulging and severe facet and ligamentum flavum hypertrophy without stenosis.   L3-4: Minimal disc bulging and severe facet and ligamentum flavum hypertrophy without stenosis.   L4-5: Disc desiccation. Anterolisthesis with mild disc uncovering and severe facet and ligamentum flavum hypertrophy without stenosis.   L5-S1: Disc desiccation and moderate disc space narrowing. Disc bulging, endplate spurring, and severe facet and ligamentum flavum hypertrophy result in mild bilateral lateral recess stenosis and moderate to severe bilateral neural foraminal stenosis without significant spinal stenosis.   IMPRESSION: 1. Severe diffuse lumbar facet arthrosis. 2. Moderate to severe bilateral neural foraminal stenosis at L5-S1. 3. No spinal stenosis.     Electronically Signed   By: Dasie Hamburg M.D.   On: 03/07/2023 08:47  PMFS History: Patient Active Problem List   Diagnosis Date Noted   S/P total knee replacement, right 12/04/2023   Total knee replacement status, right 11/30/2023   Unilateral primary osteoarthritis, right knee 11/30/2023   Anticoagulated 11/23/2023   Burn 06/27/2023   Hemorrhoid 03/23/2023   RVF (right ventricular failure) (HCC) 01/12/2023   Atopic dermatitis 01/12/2023   Shortness of breath 08/29/2022   Encounter for screening mammogram for malignant neoplasm of breast 08/29/2022   Right groin pain 01/13/2022   Bilateral pulmonary embolism with right heart strain RV/LV ratio 1:5 (HCC) 12/31/2021   Acute respiratory failure with hypoxia (HCC) 12/31/2021   Total knee replacement status, left 12/09/21 12/09/2021   Unilateral primary osteoarthritis, left knee    Shoulder disorder 09/03/2021   Arthritis of knee 09/03/2021   Lumbar radiculopathy 09/03/2021   Abnormal MRI, knee 09/03/2021   Chronic pain of right knee 09/03/2021   Bronchitis 03/25/2021   Anemia 01/09/2020   HTN (hypertension)  12/31/2019   Chest pain 12/31/2019   DOE (dyspnea on exertion) 12/31/2019   Acute coronary syndrome (HCC) 12/31/2019   Unstable angina (HCC) 12/31/2019   Atherosclerosis 08/23/2018   Screening for tuberculosis 07/12/2018   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   Arthralgia 07/12/2018   Solitary pulmonary nodule 07/12/2018   Neck pain 02/15/2018   Dizziness 02/15/2018   Elevated TSH 02/15/2018   Screening for HIV (human immunodeficiency virus) 10/05/2017   Neuropathy 10/05/2017   Screening for colon cancer 10/05/2017   Hyperlipidemia 08/04/2016   Prediabetes 08/04/2016   Back pain 08/04/2016   Bilateral knee pain 06/18/2016   History of DVT of lower extremity 04/01/2014   History of  DVT and PE (deep vein thrombosis) 05/04/2011   Past Medical History:  Diagnosis Date   Arthritis    Complication of anesthesia    Hard to wake up   DVT (deep venous thrombosis) (HCC) 2013   Dyspnea    due to knee pain   GERD (gastroesophageal reflux disease)    History of blood transfusion    History of hypertension    Hypertension    Peripheral vascular disease (HCC)    DVT   Pre-diabetes    Pulmonary embolism (HCC) 2013    Family History  Problem Relation Age of Onset   Hypertension Father    Heart disease Father 74   Thyroid disease Sister    Allergies Sister    CVA Neg Hx    Heart attack Neg Hx    High Cholesterol Neg Hx    Colon cancer Neg Hx     Past Surgical History:  Procedure Laterality Date   APPENDECTOMY     PULMONARY THROMBECTOMY Bilateral 01/01/2022   Procedure: PULMONARY THROMBECTOMY;  Surgeon: Jama Cordella MATSU, MD;  Location: [facility] INVASIVE CV LAB;  Service: Cardiovascular;  Laterality: Bilateral;   TONSILLECTOMY AND ADENOIDECTOMY     TOTAL KNEE ARTHROPLASTY Left 12/09/2021   Procedure: LEFT TOTAL KNEE ARTHROPLASTY;  Surgeon: Harden Jerona GAILS, MD;  Location: [facility] OR;  Service: Orthopedics;  Laterality: Left;   TOTAL KNEE ARTHROPLASTY Right 11/30/2023   Procedure: ARTHROPLASTY, KNEE,  TOTAL;  Surgeon: Harden Jerona GAILS, MD;  Location: [facility] OR;  Service: Orthopedics;  Laterality: Right;   Social History   Occupational History   Not on file  Tobacco Use   Smoking status: Never   Smokeless tobacco: Never  Vaping Use   Vaping status: Never Used  Substance and Sexual Activity   Alcohol use: Yes    Comment: Ocassional glass of wine   Drug use: No   Sexual activity: Not on file

## 2024-01-11 NOTE — Telephone Encounter (Signed)
 Left message to return call to our office.

## 2024-01-11 NOTE — Telephone Encounter (Signed)
 Copied from CRM [id number]. Topic: Clinical - Prescription Issue >> Jan 10, 2024  3:13 PM Mercedes MATSU wrote: Reason for CRM: [facility] called and stated that the Patients medication has been sent to the office via mail, and that someone signed for it. They want to know if the medication is still at the clinic, and if and when can the patient come and pick up their package. Good call back number is [phone] for the patient and for [facility] it is [phone] [facility]. >> Jan 11, 2024 11:58 AM [place] J wrote: Ellouise, a patient advocate calling from [facility], is calling because a shipment for eliquis  has been sent out to our clinic instead of [facility] by the cancer center.   The cancer center initially sent the eliquis  on the 11th of August, which the patient picked up on the 26th. Prior to the patient picking up the mediation, the cancer center resent another shipment of eliquis  on the 26th to our clinic since the patient was reporting that it was never received at [facility] (this was before the patient received a notification from us  that the eliquis  was at our clinic and ready for pick up).   Ellouise is wanting to know if we received a second shipment of the eliquis  recently, and if so she can come pick it up for the patient and bring it back to . She would also like if the CMA could call and speak with her about this situation in order to clear up any confusion.   Please call Ellouise at: [phone] or reach out via teams at [facility] March.

## 2024-01-17 ENCOUNTER — Other Ambulatory Visit: Payer: Self-pay | Admitting: Physician Assistant

## 2024-01-17 ENCOUNTER — Other Ambulatory Visit: Payer: Self-pay

## 2024-01-17 ENCOUNTER — Telehealth: Payer: Self-pay | Admitting: Orthopedic Surgery

## 2024-01-17 DIAGNOSIS — Z96651 Presence of right artificial knee joint: Secondary | ICD-10-CM

## 2024-01-17 MED ORDER — OXYCODONE-ACETAMINOPHEN 10-325 MG PO TABS
1.0000 | ORAL_TABLET | Freq: Four times a day (QID) | ORAL | 0 refills | Status: DC | PRN
  Filled 2024-01-17: qty 30, 8d supply, fill #0

## 2024-01-17 NOTE — Telephone Encounter (Signed)
 Patient would like a refill on pain medication

## 2024-01-17 NOTE — Telephone Encounter (Signed)
 Pt is s/p a left total knee replacement on 11/30/2023. Asking for refill of pain medication last refill was 01/11/24 Oxycodone  10/325 #20 please advise.

## 2024-01-18 ENCOUNTER — Ambulatory Visit: Payer: Self-pay

## 2024-01-18 ENCOUNTER — Other Ambulatory Visit: Payer: Self-pay

## 2024-01-18 DIAGNOSIS — M25661 Stiffness of right knee, not elsewhere classified: Secondary | ICD-10-CM

## 2024-01-18 DIAGNOSIS — R262 Difficulty in walking, not elsewhere classified: Secondary | ICD-10-CM

## 2024-01-18 DIAGNOSIS — G8929 Other chronic pain: Secondary | ICD-10-CM

## 2024-01-18 NOTE — Therapy (Signed)
 OUTPATIENT PHYSICAL THERAPY  TREATMENT   Patient Name: Leah Roberts MRN: [id number] DOB:02/07/1951, 73 y.o., female Today's Date: 01/18/2024  END OF SESSION:  PT End of Session - 01/18/24 1120     Visit Number 4    Number of Visits 17    Date for PT Re-Evaluation 02/24/24    PT Start Time 1120    PT Stop Time 1203    PT Time Calculation (min) 43 min    Activity Tolerance Patient tolerated treatment well;Patient limited by pain    Behavior During Therapy [facility] for tasks assessed/performed             Past Medical History:  Diagnosis Date   Arthritis    Complication of anesthesia    Hard to wake up   DVT (deep venous thrombosis) (HCC) 2013   Dyspnea    due to knee pain   GERD (gastroesophageal reflux disease)    History of blood transfusion    History of hypertension    Hypertension    Peripheral vascular disease (HCC)    DVT   Pre-diabetes    Pulmonary embolism (HCC) 2013   Past Surgical History:  Procedure Laterality Date   APPENDECTOMY     PULMONARY THROMBECTOMY Bilateral 01/01/2022   Procedure: PULMONARY THROMBECTOMY;  Surgeon: Jama Cordella MATSU, MD;  Location: [facility] INVASIVE CV LAB;  Service: Cardiovascular;  Laterality: Bilateral;   TONSILLECTOMY AND ADENOIDECTOMY     TOTAL KNEE ARTHROPLASTY Left 12/09/2021   Procedure: LEFT TOTAL KNEE ARTHROPLASTY;  Surgeon: Harden Jerona GAILS, MD;  Location: [facility] OR;  Service: Orthopedics;  Laterality: Left;   TOTAL KNEE ARTHROPLASTY Right 11/30/2023   Procedure: ARTHROPLASTY, KNEE, TOTAL;  Surgeon: Harden Jerona GAILS, MD;  Location: [facility] OR;  Service: Orthopedics;  Laterality: Right;   Patient Active Problem List   Diagnosis Date Noted   S/P total knee replacement, right 12/04/2023   Total knee replacement status, right 11/30/2023   Unilateral primary osteoarthritis, right knee 11/30/2023   Anticoagulated 11/23/2023   Burn 06/27/2023   Hemorrhoid 03/23/2023   RVF (right ventricular failure) (HCC) 01/12/2023   Atopic  dermatitis 01/12/2023   Shortness of breath 08/29/2022   Encounter for screening mammogram for malignant neoplasm of breast 08/29/2022   Right groin pain 01/13/2022   Bilateral pulmonary embolism with right heart strain RV/LV ratio 1:5 (HCC) 12/31/2021   Acute respiratory failure with hypoxia (HCC) 12/31/2021   Total knee replacement status, left 12/09/21 12/09/2021   Unilateral primary osteoarthritis, left knee    Shoulder disorder 09/03/2021   Arthritis of knee 09/03/2021   Lumbar radiculopathy 09/03/2021   Abnormal MRI, knee 09/03/2021   Chronic pain of right knee 09/03/2021   Bronchitis 03/25/2021   Anemia 01/09/2020   HTN (hypertension) 12/31/2019   Chest pain 12/31/2019   DOE (dyspnea on exertion) 12/31/2019   Acute coronary syndrome (HCC) 12/31/2019   Unstable angina (HCC) 12/31/2019   Atherosclerosis 08/23/2018   Screening for tuberculosis 07/12/2018   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   Arthralgia 07/12/2018   Solitary pulmonary nodule 07/12/2018   Neck pain 02/15/2018   Dizziness 02/15/2018   Elevated TSH 02/15/2018   Screening for HIV (human immunodeficiency virus) 10/05/2017   Neuropathy 10/05/2017   Screening for colon cancer 10/05/2017   Hyperlipidemia 08/04/2016   Prediabetes 08/04/2016   Back pain 08/04/2016   Bilateral knee pain 06/18/2016   History of DVT of lower extremity 04/01/2014   History of DVT and PE (deep vein thrombosis) 05/04/2011    PCP: Dineen,  Rollene MATSU, FNP   REFERRING PROVIDER: Harden Jerona GAILS, MD  REFERRING DIAG: Diagnosis [phone] (ICD-10-CM) - S/P total knee replacement, right  Rationale for Evaluation and Treatment: Rehabilitation  THERAPY DIAG:  Chronic pain of right knee  Difficulty in walking, not elsewhere classified  Stiffness of right knee, not elsewhere classified  ONSET DATE: S/P R TKA on 11/30/2023  SUBJECTIVE:                                                                                                                                                                                            SUBJECTIVE STATEMENT: Just took her pain medication about 1 hour ago which is helping her pain feel better. 7/10 during gait, 6/10 in sitting. Was unable to make it to her last appointment because she did not have a ride.      PERTINENT HISTORY:  S/P R TKA on 11/30/2023. Has only had to 2 PT home health follow up treatments. Sister brought her to PT today. Has been icing her knee and propping it up.    Blood pressure is controlled per pt.      PAIN:  Are you having pain? Yes: NPRS scale: /10 currently Pain location: R knee Pain description: stiff and sore Aggravating factors: bending  Relieving factors: rest, ice and elevation.   PRECAUTIONS: Knee  RED FLAGS: Bowel or bladder incontinence: No and Cauda equina syndrome: No   WEIGHT BEARING RESTRICTIONS: WBAT  FALLS:  Has patient fallen in last 6 months? No  LIVING ENVIRONMENT: Lives with: sister Lives in: House/apartment Stairs: No Has following equipment at home: Environmental consultant - 2 wheeled  OCCUPATION: Retired  PLOF: Mod I with gait using SPC  PATIENT GOALS: be able to stand up and walk.   NEXT MD VISIT: 01/09/2024  OBJECTIVE:  Note: Objective measures were completed at Evaluation unless otherwise noted.  DIAGNOSTIC FINDINGS:    PATIENT SURVEYS:  LEFS  Extreme difficulty/unable (0), Quite a bit of difficulty (1), Moderate difficulty (2), Little difficulty (3), No difficulty (4) Survey date:  12/28/2023  Any of your usual work, housework or school activities   2. Usual hobbies, recreational or sporting activities   3. Getting into/out of the bath   4. Walking between rooms   5. Putting on socks/shoes   6. Squatting    7. Lifting an object, like a bag of groceries from the floor   8. Performing light activities around your home   9. Performing heavy activities around your home   10. Getting into/out of a car   11. Walking 2 blocks   12. Walking  1 mile  13. Going up/down 10 stairs (1 flight)   14. Standing for 1 hour   15.  sitting for 1 hour   16. Running on even ground   17. Running on uneven ground   18. Making sharp turns while running fast   19. Hopping    20. Rolling over in bed   Score total:  7/80     COGNITION: Overall cognitive status: Within functional limits for tasks assessed     SENSATION: Numbness R lateral knee  MUSCLE LENGTH:  POSTURE: decreased R knee extension in standing, B UE assist at rw.   PALPATION: TTP R knee (medial, lateral, anterior and posterior) Decreased fascial mobility anterior R knee.    LOWER EXTREMITY ROM:     Active  Right eval Left eval  Hip flexion    Hip extension    Hip abduction    Hip adduction    Hip internal rotation    Hip external rotation    Knee flexion 56 (60 AAROM, stiff end feel)   Knee extension -24 (-15 AAROM), stiff end feel   Ankle dorsiflexion    Ankle plantarflexion    Ankle inversion    Ankle eversion     (Blank rows = not tested)  LOWER EXTREMITY MMT:    MMT Right eval Left eval  Hip flexion    Hip extension    Hip abduction    Hip adduction    Hip internal rotation    Hip external rotation    Knee flexion 3+   Knee extension 4   Ankle dorsiflexion    Ankle plantarflexion    Ankle inversion    Ankle eversion     (Blank rows = not tested)  LUMBAR SPECIAL TESTS:    FUNCTIONAL TESTS:    GAIT: Distance walked: 40 ft Assistive device utilized: Environmental consultant - 2 wheeled Level of assistance: Modified independence Comments: antalgic, decreased stance R LE, step to gait pattern, no knee flexion during swing phase of gait, decreased knee extension during stance phase of gait  TREATMENT DATE: 01/18/2024                                                                                                                               Finished 7 weeks post op on 01/18/2024   Manual therapy  Seated STM R anterior knee to improve fascial  mobility  Reclined with R leg propped on a pillow  STM R hamstrings to decrease tension and improve knee extension ROM   Therapeutic exercise  Seated R knee AROM at start of session   Flexion 61 degrees  Extension -13 degrees   Seated R knee flexion PROM with PT 10x3  Gentle knee flexion/extension after each set to ease pain  Seated R knee flexion AAROM 10x2  Improved R knee flexion AROM to 70 degrees afterwards   Gentle knee flexion/extension after each set to ease pain  Reclined quad set 10x3 with 2  second holds  Gentle knee flexion/extension after each set to ease pain   R knee extension AROM aftewrards: -9 degrees  Seated R LAQ 10x5 seconds for 2 sets  Gait with rw with emphasis with R knee flexion during swing phase, ankle DF during heel strike, and knee extension during stance phase 30 ft     Improved exercise technique, movement at target joints, use of target muscles after mod verbal, visual, tactile cues.        PATIENT EDUCATION:  Education details: there-ex, HEP, POC Person educated: Patient Education method: Explanation, Demonstration, Tactile cues, Verbal cues, and Handouts Education comprehension: verbalized understanding and returned demonstration  HOME EXERCISE PROGRAM: Access Code: [email or web address] URL: [email or web address] Date: 12/28/2023 Prepared by: Emil Glassman  Exercises - Supine Quad Set  - 3 x daily - 7 x weekly - 3 sets - 10 reps - 5 seconds hold - Seated Knee Flexion AAROM  - 3 x daily - 7 x weekly - 3 sets - 10 reps - 5 seconds hold - Seated Heel Toe Raises  - 3 x daily - 7 x weekly - 3 sets - 10 reps  - Seated Long Arc Quad  - 2 x daily - 7 x weekly - 2 sets - 10 reps - 5 seconds hold    ASSESSMENT:  CLINICAL IMPRESSION:    Continued working on improving fascial mobility anterior knee as well as decreasing hamstrings muscle tension to promote knee flexion and extension ROM. Continued working and knee flexion and  extension ROM exercises secondary to stiffness. Fair tolerance to today's session. Pt will benefit from continued skilled physical therapy services to improve ROM, strength, function, and ability to ambulate with less difficulty.       OBJECTIVE IMPAIRMENTS: Abnormal gait, decreased balance, difficulty walking, decreased ROM, decreased strength, improper body mechanics, postural dysfunction, and pain.   ACTIVITY LIMITATIONS: carrying, lifting, bending, standing, squatting, stairs, transfers, bed mobility, dressing, and locomotion level  PARTICIPATION LIMITATIONS:   PERSONAL FACTORS: Age, Fitness, Past/current experiences, Time since onset of injury/illness/exacerbation, and 3+ comorbidities: arthritis, hx of DVT 2013, dyspnea, HTN, PVD, PE 2013 are also affecting patient's functional outcome.   REHAB POTENTIAL: Fair    CLINICAL DECISION MAKING: Stable/uncomplicated  EVALUATION COMPLEXITY: Low   GOALS: Goals reviewed with patient? Yes  SHORT TERM GOALS: Target date: 01/06/2024  Pt will be independent with her initial HEP to improve R knee ROM, strength, and ability to ambulate and perform transfers with less difficulty.  Baseline: Pt has started her initial HEP (12/28/2023) Goal status: INITIAL    LONG TERM GOALS: Target date: 02/24/2024  Pt will be able to ambulate at least 500 ft without AD and no LOB to promote mobility.  Baseline: Pt currently ambulating with rw. (12/28/2023) Goal status: INITIAL  2.  Pt will improve R knee flexion AROM to 115 degrees or more and R knee extension AROM to -5 degrees or more to promote ability to ambulate, perform standing tasks, perform transfers and negotiate stairs with less difficulty.  Baseline:  Active  Right eval  Knee flexion 56 (60 AAROM, stiff end feel)  Knee extension -24 (-15 AAROM), stiff end feel   Goal status: INITIAL  3.  Pt will improve her R knee flexion and extension strength by at least 1/2 MMT grade to promote ability  to ambulate, perform standing tasks with less difficulty.  Baseline:  MMT Right eval  Knee flexion 3+  Knee extension 4   Goal status: INITIAL  4.  Pt will improve her LEFS score by at least 20 points as a demonstration of improved function.  Baseline: 7/80 (12/28/2023) Goal status: INITIAL   PLAN:  PT FREQUENCY: 1-2x/week  PT DURATION: 8 weeks  PLANNED INTERVENTIONS: 97110-Therapeutic exercises, 97530- Therapeutic activity, 97112- Neuromuscular re-education, 97535- Self Care, 02859- Manual therapy, [phone]- Gait training, [phone]- Electrical stimulation (unattended), [phone]- Ionotophoresis 4mg /ml Dexamethasone , Patient/Family education, Balance training, Joint mobilization, and Scar mobilization.  PLAN FOR NEXT SESSION: knee ROM, strengthening, gait, function, manual techniques, modalities PRN   Victoriah Wilds, PT, DPT 01/18/2024, 12:09 PM

## 2024-01-19 ENCOUNTER — Other Ambulatory Visit: Payer: Self-pay | Admitting: Physician Assistant

## 2024-01-20 ENCOUNTER — Ambulatory Visit: Payer: Self-pay

## 2024-01-20 DIAGNOSIS — M25661 Stiffness of right knee, not elsewhere classified: Secondary | ICD-10-CM

## 2024-01-20 DIAGNOSIS — G8929 Other chronic pain: Secondary | ICD-10-CM

## 2024-01-20 DIAGNOSIS — R262 Difficulty in walking, not elsewhere classified: Secondary | ICD-10-CM

## 2024-01-20 NOTE — Therapy (Signed)
 OUTPATIENT PHYSICAL THERAPY  TREATMENT   Patient Name: Leah Roberts MRN: [id number] DOB:06/17/1950, 73 y.o., female Today's Date: 01/20/2024  END OF SESSION:  PT End of Session - 01/20/24 0738     Visit Number 5    Number of Visits 17    Date for Recertification  02/24/24    Progress Note Due on Visit 10    PT Start Time 0733    PT Stop Time 0813    PT Time Calculation (min) 40 min    Equipment Utilized During Treatment Gait belt    Activity Tolerance Patient tolerated treatment well;Patient limited by pain    Behavior During Therapy WFL for tasks assessed/performed             Past Medical History:  Diagnosis Date   Arthritis    Complication of anesthesia    Hard to wake up   DVT (deep venous thrombosis) (HCC) 2013   Dyspnea    due to knee pain   GERD (gastroesophageal reflux disease)    History of blood transfusion    History of hypertension    Hypertension    Peripheral vascular disease (HCC)    DVT   Pre-diabetes    Pulmonary embolism (HCC) 2013   Past Surgical History:  Procedure Laterality Date   APPENDECTOMY     PULMONARY THROMBECTOMY Bilateral 01/01/2022   Procedure: PULMONARY THROMBECTOMY;  Surgeon: Jama Cordella MATSU, MD;  Location: [facility] INVASIVE CV LAB;  Service: Cardiovascular;  Laterality: Bilateral;   TONSILLECTOMY AND ADENOIDECTOMY     TOTAL KNEE ARTHROPLASTY Left 12/09/2021   Procedure: LEFT TOTAL KNEE ARTHROPLASTY;  Surgeon: Harden Jerona GAILS, MD;  Location: [facility] OR;  Service: Orthopedics;  Laterality: Left;   TOTAL KNEE ARTHROPLASTY Right 11/30/2023   Procedure: ARTHROPLASTY, KNEE, TOTAL;  Surgeon: Harden Jerona GAILS, MD;  Location: [facility] OR;  Service: Orthopedics;  Laterality: Right;   Patient Active Problem List   Diagnosis Date Noted   S/P total knee replacement, right 12/04/2023   Total knee replacement status, right 11/30/2023   Unilateral primary osteoarthritis, right knee 11/30/2023   Anticoagulated 11/23/2023   Burn 06/27/2023    Hemorrhoid 03/23/2023   RVF (right ventricular failure) (HCC) 01/12/2023   Atopic dermatitis 01/12/2023   Shortness of breath 08/29/2022   Encounter for screening mammogram for malignant neoplasm of breast 08/29/2022   Right groin pain 01/13/2022   Bilateral pulmonary embolism with right heart strain RV/LV ratio 1:5 (HCC) 12/31/2021   Acute respiratory failure with hypoxia (HCC) 12/31/2021   Total knee replacement status, left 12/09/21 12/09/2021   Unilateral primary osteoarthritis, left knee    Shoulder disorder 09/03/2021   Arthritis of knee 09/03/2021   Lumbar radiculopathy 09/03/2021   Abnormal MRI, knee 09/03/2021   Chronic pain of right knee 09/03/2021   Bronchitis 03/25/2021   Anemia 01/09/2020   HTN (hypertension) 12/31/2019   Chest pain 12/31/2019   DOE (dyspnea on exertion) 12/31/2019   Acute coronary syndrome (HCC) 12/31/2019   Unstable angina (HCC) 12/31/2019   Atherosclerosis 08/23/2018   Screening for tuberculosis 07/12/2018   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   Arthralgia 07/12/2018   Solitary pulmonary nodule 07/12/2018   Neck pain 02/15/2018   Dizziness 02/15/2018   Elevated TSH 02/15/2018   Screening for HIV (human immunodeficiency virus) 10/05/2017   Neuropathy 10/05/2017   Screening for colon cancer 10/05/2017   Hyperlipidemia 08/04/2016   Prediabetes 08/04/2016   Back pain 08/04/2016   Bilateral knee pain 06/18/2016   History of DVT of lower  extremity 04/01/2014   History of DVT and PE (deep vein thrombosis) 05/04/2011    PCP: Dineen Rollene MATSU, FNP   REFERRING PROVIDER: Harden Jerona GAILS, MD  REFERRING DIAG: Diagnosis [phone] (ICD-10-CM) - S/P total knee replacement, right  Rationale for Evaluation and Treatment: Rehabilitation  THERAPY DIAG:  Chronic pain of right knee  Difficulty in walking, not elsewhere classified  Stiffness of right knee, not elsewhere classified  ONSET DATE: S/P R TKA on 11/30/2023  SUBJECTIVE:                                                                                                                                                                                            SUBJECTIVE STATEMENT:  Early appointment today. Pt ws in ahurry to get here. Veyr painful, forget pain meds. Still very stiff.   PERTINENT HISTORY:  S/P R TKA on 11/30/2023. Has only had to 2 PT home health follow up treatments. Sister brought her to PT today. Has been icing her knee and propping it up.  PAIN:  Are you having pain? 9/10 this morning, running late early AM and forgot her pain meds and is still stiff from overnight sleep.   PRECAUTIONS: Knee  RED FLAGS: Bowel or bladder incontinence: No and Cauda equina syndrome: No   WEIGHT BEARING RESTRICTIONS: WBAT  FALLS:  Has patient fallen in last 6 months? No  LIVING ENVIRONMENT: Lives with: sister Lives in: House/apartment Stairs: No Has following equipment at home: Environmental consultant - 2 wheeled  OCCUPATION: Retired  PLOF: Mod I with gait using SPC  PATIENT GOALS: be able to stand up and walk.   NEXT MD VISIT: 01/09/2024  OBJECTIVE:  Note: Objective measures were completed at Evaluation unless otherwise noted.  TREATMENT 01/20/24                                                                                                                     -seated AA/ROM Rt knee heel slides 3x10 with self assists foot loop  -hooklying AA/ROM (reclined for LBP) 10x10sec  -SAQ 1x15 over burgundy bolster  -applied ICE  -Rt ankle DF c 2.5lb AW  across metatarsal line -SAQ 1x15 over burgundy bolster with concurrent ankle DF c 2.5lb AW across metatarsal line -Rt ankle DF c 2.5lb AW across metatarsal line  -STS from table x5 hands free (elevated)  -AMB overground -137ft AB overground c RW *pain down to 7/10 by end of session, pt reports concurrent crytherapy helpful   PATIENT EDUCATION:  Education details: there-ex, HEP, POC Person educated: Patient Education method: Explanation,  Demonstration, Tactile cues, Verbal cues, and Handouts Education comprehension: verbalized understanding and returned demonstration  HOME EXERCISE PROGRAM: Access Code: [email or web address] URL: [email or web address] Date: 12/28/2023 Prepared by: Emil Glassman  Exercises - Supine Quad Set  - 3 x daily - 7 x weekly - 3 sets - 10 reps - 5 seconds hold - Seated Knee Flexion AAROM  - 3 x daily - 7 x weekly - 3 sets - 10 reps - 5 seconds hold - Seated Heel Toe Raises  - 3 x daily - 7 x weekly - 3 sets - 10 reps - Seated Long Arc Quad  - 2 x daily - 7 x weekly - 2 sets - 10 reps - 5 seconds hold   ASSESSMENT:  CLINICAL IMPRESSION: Pt continues to have significant pain linitations ovarall, particularly stiff today as her appointment is quite early. We continue to AA/ROM and motor activation, as well as progressing gross motor activities. Concurrent  use of crytherapy helped take the edge off he rpainful knee and improved participation. Extensor mechanism working very well with only a fraction of th epain experienced with flexion stretching. Pt will benefit from continued skilled physical therapy services to improve ROM, strength, function, and ability to ambulate with less difficulty.    OBJECTIVE IMPAIRMENTS: Abnormal gait, decreased balance, difficulty walking, decreased ROM, decreased strength, improper body mechanics, postural dysfunction, and pain.   ACTIVITY LIMITATIONS: carrying, lifting, bending, standing, squatting, stairs, transfers, bed mobility, dressing, and locomotion level  PARTICIPATION LIMITATIONS:   PERSONAL FACTORS: Age, Fitness, Past/current experiences, Time since onset of injury/illness/exacerbation, and 3+ comorbidities: arthritis, hx of DVT 2013, dyspnea, HTN, PVD, PE 2013 are also affecting patient's functional outcome.   REHAB POTENTIAL: Fair    CLINICAL DECISION MAKING: Stable/uncomplicated  EVALUATION COMPLEXITY: Low   GOALS: Goals reviewed with patient?  Yes  SHORT TERM GOALS: Target date: 01/06/2024  Pt will be independent with her initial HEP to improve R knee ROM, strength, and ability to ambulate and perform transfers with less difficulty.  Baseline: Pt has started her initial HEP (12/28/2023) Goal status: INITIAL  LONG TERM GOALS: Target date: 02/24/2024  Pt will be able to ambulate at least 500 ft without AD and no LOB to promote mobility.  Baseline: Pt currently ambulating with rw. (12/28/2023) Goal status: INITIAL  2.  Pt will improve R knee flexion AROM to 115 degrees or more and R knee extension AROM to -5 degrees or more to promote ability to ambulate, perform standing tasks, perform transfers and negotiate stairs with less difficulty.  Baseline:  Active  Right eval  Knee flexion 56 (60 AAROM, stiff end feel)  Knee extension -24 (-15 AAROM), stiff end feel   Goal status: INITIAL  3.  Pt will improve her R knee flexion and extension strength by at least 1/2 MMT grade to promote ability to ambulate, perform standing tasks with less difficulty.  Baseline:  MMT Right eval  Knee flexion 3+  Knee extension 4   Goal status: INITIAL  4.  Pt will improve her LEFS score by at least 20 points as  a demonstration of improved function.  Baseline: 7/80 (12/28/2023) Goal status: INITIAL  PLAN:  PT FREQUENCY: 1-2x/week  PT DURATION: 8 weeks  PLANNED INTERVENTIONS: 97110-Therapeutic exercises, 97530- Therapeutic activity, 97112- Neuromuscular re-education, 97535- Self Care, 02859- Manual therapy, [phone]- Gait training, [phone]- Electrical stimulation (unattended), [phone]- Ionotophoresis 4mg /ml Dexamethasone , Patient/Family education, Balance training, Joint mobilization, and Scar mobilization.  PLAN FOR NEXT SESSION: knee ROM, strengthening, gait, function, manual techniques, modalities PRN  Glen Blatchley C, PT, DPT 01/20/2024, 7:44 AM  7:44 AM, 01/20/24 Peggye JAYSON Linear, PT, DPT Physical Therapist - [place] [phone]  (Office)

## 2024-01-25 ENCOUNTER — Ambulatory Visit: Payer: Self-pay

## 2024-01-25 DIAGNOSIS — M25661 Stiffness of right knee, not elsewhere classified: Secondary | ICD-10-CM

## 2024-01-25 DIAGNOSIS — R262 Difficulty in walking, not elsewhere classified: Secondary | ICD-10-CM

## 2024-01-25 DIAGNOSIS — G8929 Other chronic pain: Secondary | ICD-10-CM

## 2024-01-25 NOTE — Therapy (Signed)
 OUTPATIENT PHYSICAL THERAPY  TREATMENT   Patient Name: Leah Roberts MRN: [id number] DOB:[birth year], 73 y.o., female Today's Date: 01/25/2024  END OF SESSION:  PT End of Session - 01/25/24 1351     Visit Number 6    Number of Visits 17    Date for Recertification  02/24/24    Progress Note Due on Visit 10    PT Start Time 1351    PT Stop Time 1434    PT Time Calculation (min) 43 min    Equipment Utilized During Treatment --    Activity Tolerance Patient tolerated treatment well;Patient limited by pain    Behavior During Therapy WFL for tasks assessed/performed              Past Medical History:  Diagnosis Date   Arthritis    Complication of anesthesia    Hard to wake up   DVT (deep venous thrombosis) (HCC) 2013   Dyspnea    due to knee pain   GERD (gastroesophageal reflux disease)    History of blood transfusion    History of hypertension    Hypertension    Peripheral vascular disease    DVT   Pre-diabetes    Pulmonary embolism (HCC) 2013   Past Surgical History:  Procedure Laterality Date   APPENDECTOMY     PULMONARY THROMBECTOMY Bilateral 01/01/2022   Procedure: PULMONARY THROMBECTOMY;  Surgeon: Jama Cordella MATSU, MD;  Location: [facility] IN[facility];  Service: Cardiovascular;  Laterality: Bilateral;   TONSILLECTOMY AND ADENOIDECTOMY     TOTAL KNEE ARTHROPLASTY Left 12/09/2021   Procedure: LEFT TOTAL KNEE ARTHROPLASTY;  Surgeon: Harden Jerona GAILS, MD;  Location: [facility] OR;  Service: Orthopedics;  Laterality: Left;   TOTAL KNEE ARTHROPLASTY Right 11/30/2023   Procedure: ARTHROPLASTY, KNEE, TOTAL;  Surgeon: Harden Jerona GAILS, MD;  Location: [facility] OR;  Service: Orthopedics;  Laterality: Right;   Patient Active Problem List   Diagnosis Date Noted   S/P total knee replacement, right 12/04/2023   Total knee replacement status, right 11/30/2023   Unilateral primary osteoarthritis, right knee 11/30/2023   Anticoagulated 11/23/2023   Burn 06/27/2023   Hemorrhoid  03/23/2023   RVF (right ventricular failure) (HCC) 01/12/2023   Atopic dermatitis 01/12/2023   Shortness of breath 08/29/2022   Encounter for screening mammogram for malignant neoplasm of breast 08/29/2022   Right groin pain 01/13/2022   Bilateral pulmonary embolism with right heart strain RV/LV ratio 1:5 (HCC) 12/31/2021   Acute respiratory failure with hypoxia (HCC) 12/31/2021   Total knee replacement status, left 12/09/21 12/09/2021   Unilateral primary osteoarthritis, left knee    Shoulder disorder 09/03/2021   Arthritis of knee 09/03/2021   Lumbar radiculopathy 09/03/2021   Abnormal MRI, knee 09/03/2021   Chronic pain of right knee 09/03/2021   Bronchitis 03/25/2021   Anemia 01/09/2020   HTN (hypertension) 12/31/2019   Chest pain 12/31/2019   DOE (dyspnea on exertion) 12/31/2019   Acute coronary syndrome (HCC) 12/31/2019   Unstable angina (HCC) 12/31/2019   Atherosclerosis 08/23/2018   Screening for tuberculosis 07/12/2018   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   Arthralgia 07/12/2018   Solitary pulmonary nodule 07/12/2018   Neck pain 02/15/2018   Dizziness 02/15/2018   Elevated TSH 02/15/2018   Screening for HIV (human immunodeficiency virus) 10/05/2017   Neuropathy 10/05/2017   Screening for colon cancer 10/05/2017   Hyperlipidemia 08/04/2016   Prediabetes 08/04/2016   Back pain 08/04/2016   Bilateral knee pain 06/18/2016   History of DVT of lower extremity  04/01/2014   History of DVT and PE (deep vein thrombosis) 05/04/2011    PCP: Dineen Rollene MATSU, FNP   REFERRING PROVIDER: Harden Jerona GAILS, MD  REFERRING DIAG: Diagnosis [phone] (ICD-10-CM) - S/P total knee replacement, right  Rationale for Evaluation and Treatment: Rehabilitation  THERAPY DIAG:  Chronic pain of right knee  Difficulty in walking, not elsewhere classified  Stiffness of right knee, not elsewhere classified  ONSET DATE: S/P R TKA on 11/30/2023  SUBJECTIVE:                                                                                                                                                                                            SUBJECTIVE STATEMENT:  The bottom part of her feet feel tingling R >L. Has always been there since the surgery but not as bad. Getting worse for the past week.  8/10 R knee joint pain while walking. B foot paresthesia is worse when standing.       PERTINENT HISTORY:  S/P R TKA on 11/30/2023. Has only had to 2 PT home health follow up treatments. Sister brought her to PT today. Has been icing her knee and propping it up.  PAIN:  Are you having pain? 9/10 this morning, running late early AM and forgot her pain meds and is still stiff from overnight sleep.   PRECAUTIONS: Knee  RED FLAGS: Bowel or bladder incontinence: No and Cauda equina syndrome: No   WEIGHT BEARING RESTRICTIONS: WBAT  FALLS:  Has patient fallen in last 6 months? No  LIVING ENVIRONMENT: Lives with: sister Lives in: House/apartment Stairs: No Has following equipment at home: Environmental consultant - 2 wheeled  OCCUPATION: Retired  PLOF: Mod I with gait using SPC  PATIENT GOALS: be able to stand up and walk.   NEXT MD VISIT: 01/09/2024  OBJECTIVE:  Note: Objective measures were completed at Evaluation unless otherwise noted.  TREATMENT 01/25/24          Finished 8 weeks post op on 01/25/2024   Therapeutic exercise                                                                                                        Seated  trunk flexion stretch 10x5 seconds  Increased B foot pressure reported  Seated LE neural flossing   R 10x2   Feels a release at R plantar foot   L 10x2 Feels a release at L plantar foot    Standing knee flexion stretch at first stair step with B UE assist  R 8x5 seconds for 2 sets  Standing knee extension stretch at stair step with B UE assist   R 8x5 seconds for 2 sets  Sit <> stand from regular chair with arms 2x with min A.   Difficulty secondary to R  knee flexion stiffness.   Improved exercise technique, movement at target joints, use of target muscles after mod verbal, visual, tactile cues.   Manual therapy   Seated STM R anterior knee to improve fascial mobility Seated STM R vastus lateralis to decrease muscle tension    68 degrees seated R knee flexion AROM after session.         PATIENT EDUCATION:  Education details: there-ex, HEP, POC Person educated: Patient Education method: Explanation, Demonstration, Tactile cues, Verbal cues, and Handouts Education comprehension: verbalized understanding and returned demonstration  HOME EXERCISE PROGRAM: Access Code: [email or web address] URL: [email or web address] Date: 12/28/2023 Prepared by: Emil Glassman  Exercises - Supine Quad Set  - 3 x daily - 7 x weekly - 3 sets - 10 reps - 5 seconds hold - Seated Knee Flexion AAROM  - 3 x daily - 7 x weekly - 3 sets - 10 reps - 5 seconds hold - Seated Heel Toe Raises  - 3 x daily - 7 x weekly - 3 sets - 10 reps - Seated Long Arc Quad  - 2 x daily - 7 x weekly - 2 sets - 10 reps - 5 seconds hold  - LE neural flossing  - 1 x daily - 7 x weekly - 3 sets - 10 reps     ASSESSMENT:  CLINICAL IMPRESSION: Worked on B LE neural flossing to decrease tension to B plantar nerves which improved her paresthesia. Continued working on improving R knee flexion and extension ROM secondary to stiffness. Pt able to achieve 68 degrees seated R knee flexion AROM at end of session. Fair tolerance to today's session. Pt is limited by stiffness and pain. Pt will benefit from continued skilled physical therapy services to improve R knee ROM, R LE strength, function, and ability to ambulate with less difficulty.     OBJECTIVE IMPAIRMENTS: Abnormal gait, decreased balance, difficulty walking, decreased ROM, decreased strength, improper body mechanics, postural dysfunction, and pain.   ACTIVITY LIMITATIONS: carrying, lifting, bending, standing, squatting,  stairs, transfers, bed mobility, dressing, and locomotion level  PARTICIPATION LIMITATIONS:   PERSONAL FACTORS: Age, Fitness, Past/current experiences, Time since onset of injury/illness/exacerbation, and 3+ comorbidities: arthritis, hx of DVT 2013, dyspnea, HTN, PVD, PE 2013 are also affecting patient's functional outcome.   REHAB POTENTIAL: Fair    CLINICAL DECISION MAKING: Stable/uncomplicated  EVALUATION COMPLEXITY: Low   GOALS: Goals reviewed with patient? Yes  SHORT TERM GOALS: Target date: 01/06/2024  Pt will be independent with her initial HEP to improve R knee ROM, strength, and ability to ambulate and perform transfers with less difficulty.  Baseline: Pt has started her initial HEP (12/28/2023) Goal status: INITIAL  LONG TERM GOALS: Target date: 02/24/2024  Pt will be able to ambulate at least 500 ft without AD and no LOB to promote mobility.  Baseline: Pt currently ambulating with rw. (12/28/2023) Goal status: INITIAL  2.  Pt will  improve R knee flexion AROM to 115 degrees or more and R knee extension AROM to -5 degrees or more to promote ability to ambulate, perform standing tasks, perform transfers and negotiate stairs with less difficulty.  Baseline:  Active  Right eval  Knee flexion 56 (60 AAROM, stiff end feel)  Knee extension -24 (-15 AAROM), stiff end feel   Goal status: INITIAL  3.  Pt will improve her R knee flexion and extension strength by at least 1/2 MMT grade to promote ability to ambulate, perform standing tasks with less difficulty.  Baseline:  MMT Right eval  Knee flexion 3+  Knee extension 4   Goal status: INITIAL  4.  Pt will improve her LEFS score by at least 20 points as a demonstration of improved function.  Baseline: 7/80 (12/28/2023) Goal status: INITIAL  PLAN:  PT FREQUENCY: 1-2x/week  PT DURATION: 8 weeks  PLANNED INTERVENTIONS: 97110-Therapeutic exercises, 97530- Therapeutic activity, 97112- Neuromuscular re-education, 97535-  Self Care, 02859- Manual therapy, [phone]- Gait training, [phone]- Electrical stimulation (unattended), [phone]- Ionotophoresis 4mg /ml Dexamethasone , Patient/Family education, Balance training, Joint mobilization, and Scar mobilization.  PLAN FOR NEXT SESSION: knee ROM, strengthening, gait, function, manual techniques, modalities PRN  Normand Damron, PT, DPT 01/25/2024, 2:52 PM  2:52 PM, 01/25/24  Physical Therapist - [place] [phone] (Office)

## 2024-01-27 ENCOUNTER — Ambulatory Visit: Payer: Self-pay

## 2024-01-27 DIAGNOSIS — M25661 Stiffness of right knee, not elsewhere classified: Secondary | ICD-10-CM

## 2024-01-27 DIAGNOSIS — R262 Difficulty in walking, not elsewhere classified: Secondary | ICD-10-CM

## 2024-01-27 DIAGNOSIS — G8929 Other chronic pain: Secondary | ICD-10-CM

## 2024-01-27 NOTE — Therapy (Signed)
 OUTPATIENT PHYSICAL THERAPY  TREATMENT   Patient Name: Leah Roberts MRN: [id number] DOB:Apr 18, 1951, 73 y.o., female Today's Date: 01/27/2024  END OF SESSION:  PT End of Session - 01/27/24 0817     Visit Number 7    Number of Visits 17    Date for Recertification  02/24/24    Progress Note Due on Visit 10    PT Start Time 0817    PT Stop Time 0906    PT Time Calculation (min) 49 min    Activity Tolerance Patient tolerated treatment well;Patient limited by pain    Behavior During Therapy [facility] for tasks assessed/performed               Past Medical History:  Diagnosis Date   Arthritis    Complication of anesthesia    Hard to wake up   DVT (deep venous thrombosis) (HCC) 2013   Dyspnea    due to knee pain   GERD (gastroesophageal reflux disease)    History of blood transfusion    History of hypertension    Hypertension    Peripheral vascular disease    DVT   Pre-diabetes    Pulmonary embolism (HCC) 2013   Past Surgical History:  Procedure Laterality Date   APPENDECTOMY     PULMONARY THROMBECTOMY Bilateral 01/01/2022   Procedure: PULMONARY THROMBECTOMY;  Surgeon: Jama Cordella MATSU, MD;  Location: [facility] IN[facility];  Service: Cardiovascular;  Laterality: Bilateral;   TONSILLECTOMY AND ADENOIDECTOMY     TOTAL KNEE ARTHROPLASTY Left 12/09/2021   Procedure: LEFT TOTAL KNEE ARTHROPLASTY;  Surgeon: Harden Jerona GAILS, MD;  Location: [facility] OR;  Service: Orthopedics;  Laterality: Left;   TOTAL KNEE ARTHROPLASTY Right 11/30/2023   Procedure: ARTHROPLASTY, KNEE, TOTAL;  Surgeon: Harden Jerona GAILS, MD;  Location: [facility] OR;  Service: Orthopedics;  Laterality: Right;   Patient Active Problem List   Diagnosis Date Noted   S/P total knee replacement, right 12/04/2023   Total knee replacement status, right 11/30/2023   Unilateral primary osteoarthritis, right knee 11/30/2023   Anticoagulated 11/23/2023   Burn 06/27/2023   Hemorrhoid 03/23/2023   RVF (right ventricular failure)  (HCC) 01/12/2023   Atopic dermatitis 01/12/2023   Shortness of breath 08/29/2022   Encounter for screening mammogram for malignant neoplasm of breast 08/29/2022   Right groin pain 01/13/2022   Bilateral pulmonary embolism with right heart strain RV/LV ratio 1:5 (HCC) 12/31/2021   Acute respiratory failure with hypoxia (HCC) 12/31/2021   Total knee replacement status, left 12/09/21 12/09/2021   Unilateral primary osteoarthritis, left knee    Shoulder disorder 09/03/2021   Arthritis of knee 09/03/2021   Lumbar radiculopathy 09/03/2021   Abnormal MRI, knee 09/03/2021   Chronic pain of right knee 09/03/2021   Bronchitis 03/25/2021   Anemia 01/09/2020   HTN (hypertension) 12/31/2019   Chest pain 12/31/2019   DOE (dyspnea on exertion) 12/31/2019   Acute coronary syndrome (HCC) 12/31/2019   Unstable angina (HCC) 12/31/2019   Atherosclerosis 08/23/2018   Screening for tuberculosis 07/12/2018   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   Arthralgia 07/12/2018   Solitary pulmonary nodule 07/12/2018   Neck pain 02/15/2018   Dizziness 02/15/2018   Elevated TSH 02/15/2018   Screening for HIV (human immunodeficiency virus) 10/05/2017   Neuropathy 10/05/2017   Screening for colon cancer 10/05/2017   Hyperlipidemia 08/04/2016   Prediabetes 08/04/2016   Back pain 08/04/2016   Bilateral knee pain 06/18/2016   History of DVT of lower extremity 04/01/2014   History of DVT and  PE (deep vein thrombosis) 05/04/2011    PCP: Dineen Rollene MATSU, FNP   REFERRING PROVIDER: Harden Jerona GAILS, MD  REFERRING DIAG: Diagnosis [phone] (ICD-10-CM) - S/P total knee replacement, right  Rationale for Evaluation and Treatment: Rehabilitation  THERAPY DIAG:  Chronic pain of right knee  Difficulty in walking, not elsewhere classified  Stiffness of right knee, not elsewhere classified  ONSET DATE: S/P R TKA on 11/30/2023  SUBJECTIVE:                                                                                                                                                                                            SUBJECTIVE STATEMENT:  Took her pain medication an hour ago, pain now controlled. 7/10 pain currently. Stiffness is bothering her more than pain now.        PERTINENT HISTORY:  S/P R TKA on 11/30/2023. Has only had to 2 PT home health follow up treatments. Sister brought her to PT today. Has been icing her knee and propping it up.  PAIN:  Are you having pain? 9/10 this morning, running late early AM and forgot her pain meds and is still stiff from overnight sleep.   PRECAUTIONS: Knee  RED FLAGS: Bowel or bladder incontinence: No and Cauda equina syndrome: No   WEIGHT BEARING RESTRICTIONS: WBAT  FALLS:  Has patient fallen in last 6 months? No  LIVING ENVIRONMENT: Lives with: sister Lives in: House/apartment Stairs: No Has following equipment at home: Environmental consultant - 2 wheeled  OCCUPATION: Retired  PLOF: Mod I with gait using SPC  PATIENT GOALS: be able to stand up and walk.   NEXT MD VISIT: 01/09/2024  OBJECTIVE:  Note: Objective measures were completed at Evaluation unless otherwise noted.  TREATMENT 01/27/24          Finished 8 weeks post op on 01/25/2024   Manual therapy  Seated STM R anterior knee to improve fascial mobility with knee in flexion Pt able to maintain 74 degrees seated R knee flexion AAROM with foot on ground during manual therapy   Therapeutic activities Performed with the intent on improving ability to perform transfers, negotiate steps and curbs, and perform standing tasks with less difficulty.    Seated knee flexion AAROM with PT 10x3  Pt was able to achieve 77 degrees seated R knee flexion AROM  TKE in standing with B UE assist 10x5 seconds for 3 sets  -15 degrees knee extension   Seated LE neural flossing  R 10x3  Seated B ankle heel toe raises 10x3 each direction for neural flossing  Standing with B UE assist   Forward step up  onto 4 inch step     R LE 10x3    Improved exercise technique, movement at target joints, use of target muscles after mod verbal, visual, tactile cues.           PATIENT EDUCATION:  Education details: there-ex, HEP, POC Person educated: Patient Education method: Explanation, Demonstration, Tactile cues, Verbal cues, and Handouts Education comprehension: verbalized understanding and returned demonstration  HOME EXERCISE PROGRAM: Access Code: [email or web address] URL: [email or web address] Date: 12/28/2023 Prepared by: Emil Glassman  Exercises - Supine Quad Set  - 3 x daily - 7 x weekly - 3 sets - 10 reps - 5 seconds hold - Seated Knee Flexion AAROM  - 3 x daily - 7 x weekly - 3 sets - 10 reps - 5 seconds hold - Seated Heel Toe Raises  - 3 x daily - 7 x weekly - 3 sets - 10 reps - Seated Long Arc Quad  - 2 x daily - 7 x weekly - 2 sets - 10 reps - 5 seconds hold  - LE neural flossing  - 1 x daily - 7 x weekly - 3 sets - 10 reps  - Standing Quad Set  - 3 x daily - 7 x weekly - 3 sets - 10 reps - 5 seconds hold   ASSESSMENT:  CLINICAL IMPRESSION: Pt able to achieve 77 degrees knee flexion AROM and -15 degrees R knee extension in standing quad set/terminal knee extension position. Improved seated R knee flexion ROM when STM was performed in end range knee flexion to promote fascial mobility. Also worked on step ups with R LE to improve strength with curb negotiation.  Fair tolerance to today's session. Pt is limited by stiffness and pain. Pt will benefit from continued skilled physical therapy services to improve R knee ROM, R LE strength, function, and ability to ambulate with less difficulty.     OBJECTIVE IMPAIRMENTS: Abnormal gait, decreased balance, difficulty walking, decreased ROM, decreased strength, improper body mechanics, postural dysfunction, and pain.   ACTIVITY LIMITATIONS: carrying, lifting, bending, standing, squatting, stairs, transfers, bed mobility, dressing, and  locomotion level  PARTICIPATION LIMITATIONS:   PERSONAL FACTORS: Age, Fitness, Past/current experiences, Time since onset of injury/illness/exacerbation, and 3+ comorbidities: arthritis, hx of DVT 2013, dyspnea, HTN, PVD, PE 2013 are also affecting patient's functional outcome.   REHAB POTENTIAL: Fair    CLINICAL DECISION MAKING: Stable/uncomplicated  EVALUATION COMPLEXITY: Low   GOALS: Goals reviewed with patient? Yes  SHORT TERM GOALS: Target date: 01/06/2024  Pt will be independent with her initial HEP to improve R knee ROM, strength, and ability to ambulate and perform transfers with less difficulty.  Baseline: Pt has started her initial HEP (12/28/2023) Goal status: INITIAL  LONG TERM GOALS: Target date: 02/24/2024  Pt will be able to ambulate at least 500 ft without AD and no LOB to promote mobility.  Baseline: Pt currently ambulating with rw. (12/28/2023) Goal status: INITIAL  2.  Pt will improve R knee flexion AROM to 115 degrees or more and R knee extension AROM to -5 degrees or more to promote ability to ambulate, perform standing tasks, perform transfers and negotiate stairs with less difficulty.  Baseline:  Active  Right eval  Knee flexion 56 (60 AAROM, stiff end feel)  Knee extension -24 (-15 AAROM), stiff end feel   Goal status: INITIAL  3.  Pt will improve her R knee flexion and extension strength by at least 1/2 MMT grade to promote ability to ambulate, perform standing tasks with  less difficulty.  Baseline:  MMT Right eval  Knee flexion 3+  Knee extension 4   Goal status: INITIAL  4.  Pt will improve her LEFS score by at least 20 points as a demonstration of improved function.  Baseline: 7/80 (12/28/2023) Goal status: INITIAL  PLAN:  PT FREQUENCY: 1-2x/week  PT DURATION: 8 weeks  PLANNED INTERVENTIONS: 97110-Therapeutic exercises, 97530- Therapeutic activity, 97112- Neuromuscular re-education, 97535- Self Care, 02859- Manual therapy, [phone]- Gait  training, [phone]- Electrical stimulation (unattended), [phone]- Ionotophoresis 4mg /ml Dexamethasone , Patient/Family education, Balance training, Joint mobilization, and Scar mobilization.  PLAN FOR NEXT SESSION: knee ROM, strengthening, gait, function, manual techniques, modalities PRN  Mio Schellinger, PT, DPT 01/27/2024, 9:17 AM  9:17 AM, 01/27/24  Physical Therapist - [place] [phone] (Office)

## 2024-01-30 ENCOUNTER — Telehealth: Payer: Self-pay | Admitting: Orthopedic Surgery

## 2024-01-30 NOTE — Telephone Encounter (Signed)
Patient called. Would like a refill on oxycodone.  °

## 2024-01-30 NOTE — Telephone Encounter (Signed)
 Pt is s/p a left total knee replacement 11/30/2023 and is requesting a refill od Oxycodone . Last refill was 01/19/2024 #30 please advise.

## 2024-01-31 ENCOUNTER — Telehealth: Payer: Self-pay | Admitting: Orthopedic Surgery

## 2024-01-31 ENCOUNTER — Other Ambulatory Visit: Payer: Self-pay | Admitting: Orthopedic Surgery

## 2024-01-31 ENCOUNTER — Other Ambulatory Visit: Payer: Self-pay

## 2024-01-31 ENCOUNTER — Other Ambulatory Visit: Payer: Self-pay | Admitting: Family

## 2024-01-31 DIAGNOSIS — Z96651 Presence of right artificial knee joint: Secondary | ICD-10-CM

## 2024-01-31 DIAGNOSIS — I7090 Unspecified atherosclerosis: Secondary | ICD-10-CM

## 2024-01-31 MED ORDER — OXYCODONE-ACETAMINOPHEN 10-325 MG PO TABS
1.0000 | ORAL_TABLET | Freq: Four times a day (QID) | ORAL | 0 refills | Status: DC | PRN
  Filled 2024-01-31: qty 30, 8d supply, fill #0

## 2024-01-31 NOTE — Telephone Encounter (Signed)
 S/p left total knee asked yesterday for refill on Oxycodone  and today asking for refill of muscle relaxer please advise.

## 2024-01-31 NOTE — Telephone Encounter (Signed)
 Pt called requesting refill of oxycodone  and muscle relaxer. Please send to pharmacy on file. Pt phone number is [phone].

## 2024-02-01 ENCOUNTER — Other Ambulatory Visit: Payer: Self-pay | Admitting: Physician Assistant

## 2024-02-01 ENCOUNTER — Other Ambulatory Visit: Payer: Self-pay

## 2024-02-01 ENCOUNTER — Ambulatory Visit: Attending: Orthopedic Surgery

## 2024-02-01 DIAGNOSIS — G8929 Other chronic pain: Secondary | ICD-10-CM | POA: Insufficient documentation

## 2024-02-01 DIAGNOSIS — R262 Difficulty in walking, not elsewhere classified: Secondary | ICD-10-CM | POA: Insufficient documentation

## 2024-02-01 DIAGNOSIS — M25661 Stiffness of right knee, not elsewhere classified: Secondary | ICD-10-CM | POA: Insufficient documentation

## 2024-02-01 DIAGNOSIS — M25561 Pain in right knee: Secondary | ICD-10-CM | POA: Insufficient documentation

## 2024-02-01 DIAGNOSIS — Z96651 Presence of right artificial knee joint: Secondary | ICD-10-CM

## 2024-02-01 MED ORDER — OXYCODONE-ACETAMINOPHEN 10-325 MG PO TABS
1.0000 | ORAL_TABLET | Freq: Four times a day (QID) | ORAL | 0 refills | Status: DC | PRN
  Filled 2024-02-01: qty 30, 8d supply, fill #0

## 2024-02-01 MED ORDER — ROSUVASTATIN CALCIUM 5 MG PO TABS
5.0000 mg | ORAL_TABLET | Freq: Every day | ORAL | 3 refills | Status: AC
  Filled 2024-02-01 – 2024-05-10 (×3): qty 90, 90d supply, fill #0

## 2024-02-01 MED FILL — Apixaban Tab 5 MG: ORAL | 30 days supply | Qty: 60 | Fill #2 | Status: CN

## 2024-02-01 NOTE — Therapy (Signed)
 OUTPATIENT PHYSICAL THERAPY  TREATMENT   Patient Name: Leah Roberts MRN: [id number] DOB:[birth year], 73 y.o., female Today's Date: 02/01/2024  END OF SESSION:  PT End of Session - 02/01/24 1122     Visit Number 8    Number of Visits 17    Date for Recertification  02/24/24    Progress Note Due on Visit 10    PT Start Time 1123    PT Stop Time 1203    PT Time Calculation (min) 40 min    Activity Tolerance Patient tolerated treatment well;Patient limited by pain    Behavior During Therapy [facility] for tasks assessed/performed                Past Medical History:  Diagnosis Date   Arthritis    Complication of anesthesia    Hard to wake up   DVT (deep venous thrombosis) (HCC) 2013   Dyspnea    due to knee pain   GERD (gastroesophageal reflux disease)    History of blood transfusion    History of hypertension    Hypertension    Peripheral vascular disease    DVT   Pre-diabetes    Pulmonary embolism (HCC) 2013   Past Surgical History:  Procedure Laterality Date   APPENDECTOMY     PULMONARY THROMBECTOMY Bilateral 01/01/2022   Procedure: PULMONARY THROMBECTOMY;  Surgeon: Jama Cordella MATSU, MD;  Location: [facility] INVASIVE CV LAB;  Service: Cardiovascular;  Laterality: Bilateral;   TONSILLECTOMY AND ADENOIDECTOMY     TOTAL KNEE ARTHROPLASTY Left 12/09/2021   Procedure: LEFT TOTAL KNEE ARTHROPLASTY;  Surgeon: Harden Jerona GAILS, MD;  Location: [facility] OR;  Service: Orthopedics;  Laterality: Left;   TOTAL KNEE ARTHROPLASTY Right 11/30/2023   Procedure: ARTHROPLASTY, KNEE, TOTAL;  Surgeon: Harden Jerona GAILS, MD;  Location: [facility] OR;  Service: Orthopedics;  Laterality: Right;   Patient Active Problem List   Diagnosis Date Noted   S/P total knee replacement, right 12/04/2023   Total knee replacement status, right 11/30/2023   Unilateral primary osteoarthritis, right knee 11/30/2023   Anticoagulated 11/23/2023   Burn 06/27/2023   Hemorrhoid 03/23/2023   RVF (right ventricular failure)  (HCC) 01/12/2023   Atopic dermatitis 01/12/2023   Shortness of breath 08/29/2022   Encounter for screening mammogram for malignant neoplasm of breast 08/29/2022   Right groin pain 01/13/2022   Bilateral pulmonary embolism with right heart strain RV/LV ratio 1:5 (HCC) 12/31/2021   Acute respiratory failure with hypoxia (HCC) 12/31/2021   Total knee replacement status, left 12/09/21 12/09/2021   Unilateral primary osteoarthritis, left knee    Shoulder disorder 09/03/2021   Arthritis of knee 09/03/2021   Lumbar radiculopathy 09/03/2021   Abnormal MRI, knee 09/03/2021   Chronic pain of right knee 09/03/2021   Bronchitis 03/25/2021   Anemia 01/09/2020   HTN (hypertension) 12/31/2019   Chest pain 12/31/2019   DOE (dyspnea on exertion) 12/31/2019   Acute coronary syndrome (HCC) 12/31/2019   Unstable angina (HCC) 12/31/2019   Atherosclerosis 08/23/2018   Screening for tuberculosis 07/12/2018   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   Arthralgia 07/12/2018   Solitary pulmonary nodule 07/12/2018   Neck pain 02/15/2018   Dizziness 02/15/2018   Elevated TSH 02/15/2018   Screening for HIV (human immunodeficiency virus) 10/05/2017   Neuropathy 10/05/2017   Screening for colon cancer 10/05/2017   Hyperlipidemia 08/04/2016   Prediabetes 08/04/2016   Back pain 08/04/2016   Bilateral knee pain 06/18/2016   History of DVT of lower extremity 04/01/2014   History of DVT  and PE (deep vein thrombosis) 05/04/2011    PCP: Dineen Rollene MATSU, FNP   REFERRING PROVIDER: Harden Jerona GAILS, MD  REFERRING DIAG: Diagnosis [phone] (ICD-10-CM) - S/P total knee replacement, right  Rationale for Evaluation and Treatment: Rehabilitation  THERAPY DIAG:  Chronic pain of right knee  Difficulty in walking, not elsewhere classified  Stiffness of right knee, not elsewhere classified  ONSET DATE: S/P R TKA on 11/30/2023  SUBJECTIVE:                                                                                                                                                                                            SUBJECTIVE STATEMENT: Can now do 2 steps without her rw. No R knee pain currently. Took her meds.        PERTINENT HISTORY:  S/P R TKA on 11/30/2023. Has only had to 2 PT home health follow up treatments. Sister brought her to PT today. Has been icing her knee and propping it up.  PAIN:  Are you having pain? 9/10 this morning, running late early AM and forgot her pain meds and is still stiff from overnight sleep.   PRECAUTIONS: Knee  RED FLAGS: Bowel or bladder incontinence: No and Cauda equina syndrome: No   WEIGHT BEARING RESTRICTIONS: WBAT  FALLS:  Has patient fallen in last 6 months? No  LIVING ENVIRONMENT: Lives with: sister Lives in: House/apartment Stairs: No Has following equipment at home: Environmental consultant - 2 wheeled  OCCUPATION: Retired  PLOF: Mod I with gait using SPC  PATIENT GOALS: be able to stand up and walk.   NEXT MD VISIT: 01/09/2024  OBJECTIVE:  Note: Objective measures were completed at Evaluation unless otherwise noted.  TREATMENT 02/01/24          Finished 8 weeks post op on 01/25/2024   Manual therapy  Seated STM R anterior knee to improve fascial mobility with knee in flexion Pt able to maintain 77 degrees seated R knee flexion AAROM with foot on ground during manual therapy   Therapeutic activities Performed with the intent on improving ability to perform transfers, negotiate steps and curbs, and perform standing tasks with less difficulty.   Seated knee flexion AAROM with PT 10x3  Pt was able to achieve 79 degrees seated R knee flexion AROM   TKE in standing with B UE assist 10x5 seconds for 3 sets  -13 degrees knee extension   Forward step up with B UE assist onto Air Ex pad   R 10x3  Emphasis on knee flexion    Lateral step up onto Air Ex pad with B UE assist  R  10x3  Standing with B UE assist   Side stepping 5 ft to the R and 5 ft to the  L 3x  Improved exercise technique, movement at target joints, use of target muscles after mod verbal, visual, tactile cues.      PATIENT EDUCATION:  Education details: there-ex, HEP, POC Person educated: Patient Education method: Explanation, Demonstration, Tactile cues, Verbal cues, and Handouts Education comprehension: verbalized understanding and returned demonstration  HOME EXERCISE PROGRAM: Access Code: [email or web address] URL: [email or web address] Date: 12/28/2023 Prepared by: Emil Glassman  Exercises - Supine Quad Set  - 3 x daily - 7 x weekly - 3 sets - 10 reps - 5 seconds hold - Seated Knee Flexion AAROM  - 3 x daily - 7 x weekly - 3 sets - 10 reps - 5 seconds hold - Seated Heel Toe Raises  - 3 x daily - 7 x weekly - 3 sets - 10 reps - Seated Long Arc Quad  - 2 x daily - 7 x weekly - 2 sets - 10 reps - 5 seconds hold  - LE neural flossing  - 1 x daily - 7 x weekly - 3 sets - 10 reps  - Standing Quad Set  - 3 x daily - 7 x weekly - 3 sets - 10 reps - 5 seconds hold   ASSESSMENT:  CLINICAL IMPRESSION:  Continued working on improving R knee flexion and extension ROM secondary to stiffness. Worked on functional LE strengthening to promote ability to ambulate as well as perform standing tasks with less need of AD. Fair tolerance to today's session. Pt is limited by stiffness and pain. Pt will benefit from continued skilled physical therapy services to improve R knee ROM, R LE strength, function, and ability to ambulate with less difficulty.     OBJECTIVE IMPAIRMENTS: Abnormal gait, decreased balance, difficulty walking, decreased ROM, decreased strength, improper body mechanics, postural dysfunction, and pain.   ACTIVITY LIMITATIONS: carrying, lifting, bending, standing, squatting, stairs, transfers, bed mobility, dressing, and locomotion level  PARTICIPATION LIMITATIONS:   PERSONAL FACTORS: Age, Fitness, Past/current experiences, Time since onset of  injury/illness/exacerbation, and 3+ comorbidities: arthritis, hx of DVT 2013, dyspnea, HTN, PVD, PE 2013 are also affecting patient's functional outcome.   REHAB POTENTIAL: Fair    CLINICAL DECISION MAKING: Stable/uncomplicated  EVALUATION COMPLEXITY: Low   GOALS: Goals reviewed with patient? Yes  SHORT TERM GOALS: Target date: 01/06/2024  Pt will be independent with her initial HEP to improve R knee ROM, strength, and ability to ambulate and perform transfers with less difficulty.  Baseline: Pt has started her initial HEP (12/28/2023) Goal status: INITIAL  LONG TERM GOALS: Target date: 02/24/2024  Pt will be able to ambulate at least 500 ft without AD and no LOB to promote mobility.  Baseline: Pt currently ambulating with rw. (12/28/2023) Goal status: INITIAL  2.  Pt will improve R knee flexion AROM to 115 degrees or more and R knee extension AROM to -5 degrees or more to promote ability to ambulate, perform standing tasks, perform transfers and negotiate stairs with less difficulty.  Baseline:  Active  Right eval  Knee flexion 56 (60 AAROM, stiff end feel)  Knee extension -24 (-15 AAROM), stiff end feel   Goal status: INITIAL  3.  Pt will improve her R knee flexion and extension strength by at least 1/2 MMT grade to promote ability to ambulate, perform standing tasks with less difficulty.  Baseline:  MMT Right eval  Knee flexion 3+  Knee extension  4   Goal status: INITIAL  4.  Pt will improve her LEFS score by at least 20 points as a demonstration of improved function.  Baseline: 7/80 (12/28/2023) Goal status: INITIAL  PLAN:  PT FREQUENCY: 1-2x/week  PT DURATION: 8 weeks  PLANNED INTERVENTIONS: 97110-Therapeutic exercises, 97530- Therapeutic activity, 97112- Neuromuscular re-education, 97535- Self Care, 02859- Manual therapy, [phone]- Gait training, [phone]- Electrical stimulation (unattended), [phone]- Ionotophoresis 4mg /ml Dexamethasone , Patient/Family education, Balance  training, Joint mobilization, and Scar mobilization.  PLAN FOR NEXT SESSION: knee ROM, strengthening, gait, function, manual techniques, modalities PRN  Nikolay Demetriou, PT, DPT 02/01/2024, 12:12 PM  12:12 PM, 02/01/24  Physical Therapist - [facility] [phone] (Office)

## 2024-02-03 ENCOUNTER — Ambulatory Visit

## 2024-02-03 DIAGNOSIS — M25661 Stiffness of right knee, not elsewhere classified: Secondary | ICD-10-CM

## 2024-02-03 DIAGNOSIS — R262 Difficulty in walking, not elsewhere classified: Secondary | ICD-10-CM

## 2024-02-03 DIAGNOSIS — G8929 Other chronic pain: Secondary | ICD-10-CM

## 2024-02-03 NOTE — Therapy (Signed)
 OUTPATIENT PHYSICAL THERAPY  TREATMENT   Patient Name: Leah Roberts MRN: [id number] DOB:1950/11/18, 73 y.o., female Today's Date: 02/03/2024  END OF SESSION:  PT End of Session - 02/03/24 0821     Visit Number 9    Number of Visits 17    Date for Recertification  02/24/24    Progress Note Due on Visit 10    PT Start Time 0821    PT Stop Time 0901    PT Time Calculation (min) 40 min    Activity Tolerance Patient tolerated treatment well;Patient limited by pain    Behavior During Therapy [facility] for tasks assessed/performed                Past Medical History:  Diagnosis Date   Arthritis    Complication of anesthesia    Hard to wake up   DVT (deep venous thrombosis) (HCC) 2013   Dyspnea    due to knee pain   GERD (gastroesophageal reflux disease)    History of blood transfusion    History of hypertension    Hypertension    Peripheral vascular disease    DVT   Pre-diabetes    Pulmonary embolism (HCC) 2013   Past Surgical History:  Procedure Laterality Date   APPENDECTOMY     PULMONARY THROMBECTOMY Bilateral 01/01/2022   Procedure: PULMONARY THROMBECTOMY;  Surgeon: Jama Cordella MATSU, MD;  Location: [facility] INVASIVE CV LAB;  Service: Cardiovascular;  Laterality: Bilateral;   TONSILLECTOMY AND ADENOIDECTOMY     TOTAL KNEE ARTHROPLASTY Left 12/09/2021   Procedure: LEFT TOTAL KNEE ARTHROPLASTY;  Surgeon: Harden Jerona GAILS, MD;  Location: [facility] OR;  Service: Orthopedics;  Laterality: Left;   TOTAL KNEE ARTHROPLASTY Right 11/30/2023   Procedure: ARTHROPLASTY, KNEE, TOTAL;  Surgeon: Harden Jerona GAILS, MD;  Location: [facility] OR;  Service: Orthopedics;  Laterality: Right;   Patient Active Problem List   Diagnosis Date Noted   S/P total knee replacement, right 12/04/2023   Total knee replacement status, right 11/30/2023   Unilateral primary osteoarthritis, right knee 11/30/2023   Anticoagulated 11/23/2023   Burn 06/27/2023   Hemorrhoid 03/23/2023   RVF (right ventricular failure)  (HCC) 01/12/2023   Atopic dermatitis 01/12/2023   Shortness of breath 08/29/2022   Encounter for screening mammogram for malignant neoplasm of breast 08/29/2022   Right groin pain 01/13/2022   Bilateral pulmonary embolism with right heart strain RV/LV ratio 1:5 (HCC) 12/31/2021   Acute respiratory failure with hypoxia (HCC) 12/31/2021   Total knee replacement status, left 12/09/21 12/09/2021   Unilateral primary osteoarthritis, left knee    Shoulder disorder 09/03/2021   Arthritis of knee 09/03/2021   Lumbar radiculopathy 09/03/2021   Abnormal MRI, knee 09/03/2021   Chronic pain of right knee 09/03/2021   Bronchitis 03/25/2021   Anemia 01/09/2020   HTN (hypertension) 12/31/2019   Chest pain 12/31/2019   DOE (dyspnea on exertion) 12/31/2019   Acute coronary syndrome (HCC) 12/31/2019   Unstable angina (HCC) 12/31/2019   Atherosclerosis 08/23/2018   Screening for tuberculosis 07/12/2018   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   Arthralgia 07/12/2018   Solitary pulmonary nodule 07/12/2018   Neck pain 02/15/2018   Dizziness 02/15/2018   Elevated TSH 02/15/2018   Screening for HIV (human immunodeficiency virus) 10/05/2017   Neuropathy 10/05/2017   Screening for colon cancer 10/05/2017   Hyperlipidemia 08/04/2016   Prediabetes 08/04/2016   Back pain 08/04/2016   Bilateral knee pain 06/18/2016   History of DVT of lower extremity 04/01/2014   History of DVT  and PE (deep vein thrombosis) 05/04/2011    PCP: Dineen Rollene MATSU, FNP   REFERRING PROVIDER: Harden Jerona GAILS, MD  REFERRING DIAG: Diagnosis [phone] (ICD-10-CM) - S/P total knee replacement, right  Rationale for Evaluation and Treatment: Rehabilitation  THERAPY DIAG:  Chronic pain of right knee  Difficulty in walking, not elsewhere classified  Stiffness of right knee, not elsewhere classified  ONSET DATE: S/P R TKA on 11/30/2023  SUBJECTIVE:                                                                                                                                                                                            SUBJECTIVE STATEMENT: R knee is getting better. The pain is not as bad. 7/10 currently when walking, 6/10 in sitting. Still getting problems at night.       PERTINENT HISTORY:  S/P R TKA on 11/30/2023. Has only had to 2 PT home health follow up treatments. Sister brought her to PT today. Has been icing her knee and propping it up.  PAIN:  Are you having pain? 9/10 this morning, running late early AM and forgot her pain meds and is still stiff from overnight sleep.   PRECAUTIONS: Knee  RED FLAGS: Bowel or bladder incontinence: No and Cauda equina syndrome: No   WEIGHT BEARING RESTRICTIONS: WBAT  FALLS:  Has patient fallen in last 6 months? No  LIVING ENVIRONMENT: Lives with: sister Lives in: House/apartment Stairs: No Has following equipment at home: Environmental consultant - 2 wheeled  OCCUPATION: Retired  PLOF: Mod I with gait using SPC  PATIENT GOALS: be able to stand up and walk.   NEXT MD VISIT: 01/09/2024  OBJECTIVE:  Note: Objective measures were completed at Evaluation unless otherwise noted.  TREATMENT 02/03/24          Finished 8 weeks post op on 01/25/2024   Manual therapy   Seated STM R anterior knee to improve fascial mobility with knee in flexion    Therapeutic activities Performed with the intent on improving ability to perform transfers, negotiate steps and curbs, and perform standing tasks with less difficulty.   Seated knee flexion AAROM with PT 10x3  75 degrees seated R knee flexion AROM   Seated R knee flexion yellow band 5x3 to improve hamstring strength and knee flexion ROM to promote better ability to step up onto a step.    Gait with SPC on L 20 ft, mod A. unsteady  Weak R LE single leg stance strength observed.    Standing with B UE assist   Side stepping 5 ft to the R and 5 ft to the L  5x   R lateral knee discomfort from R side bend compensation for R  glute med weakness  Stadnign with B UE assist   Hip abduction    R 8x5 seconds for 2 sets      Improved exercise technique, movement at target joints, use of target muscles after mod verbal, visual, tactile cues.      PATIENT EDUCATION:  Education details: there-ex, HEP, POC Person educated: Patient Education method: Explanation, Demonstration, Tactile cues, Verbal cues, and Handouts Education comprehension: verbalized understanding and returned demonstration  HOME EXERCISE PROGRAM: Access Code: [email or web address] URL: [email or web address] Date: 12/28/2023 Prepared by: Emil Glassman  Exercises - Supine Quad Set  - 3 x daily - 7 x weekly - 3 sets - 10 reps - 5 seconds hold - Seated Knee Flexion AAROM  - 3 x daily - 7 x weekly - 3 sets - 10 reps - 5 seconds hold - Seated Heel Toe Raises  - 3 x daily - 7 x weekly - 3 sets - 10 reps - Seated Long Arc Quad  - 2 x daily - 7 x weekly - 2 sets - 10 reps - 5 seconds hold  - LE neural flossing  - 1 x daily - 7 x weekly - 3 sets - 10 reps  - Standing Quad Set  - 3 x daily - 7 x weekly - 3 sets - 10 reps - 5 seconds hold - Standing Hip Abduction with Counter Support  - 2 x daily - 7 x weekly - 1 sets - 8 reps - 5 seconds hold  ASSESSMENT:  CLINICAL IMPRESSION:  Overall improving R knee flexion and extension AROM and strength. Continued working on decreasing anterior knee fascial restrictions to help promote knee flexion ROM. Worked on hamstrings strengthening to promote knee flexion ROM as well. Worked on gait with SPC today but pt demonstrates R LE stance strength weakness, especially with the glute med muscles. Added standing hip abduction to her HEP to help address.  Fair tolerance to today's session. Pt is limited by stiffness and pain. Pt will benefit from continued skilled physical therapy services to improve R knee ROM, R LE strength, function, and ability to ambulate with less difficulty.     OBJECTIVE IMPAIRMENTS:  Abnormal gait, decreased balance, difficulty walking, decreased ROM, decreased strength, improper body mechanics, postural dysfunction, and pain.   ACTIVITY LIMITATIONS: carrying, lifting, bending, standing, squatting, stairs, transfers, bed mobility, dressing, and locomotion level  PARTICIPATION LIMITATIONS:   PERSONAL FACTORS: Age, Fitness, Past/current experiences, Time since onset of injury/illness/exacerbation, and 3+ comorbidities: arthritis, hx of DVT 2013, dyspnea, HTN, PVD, PE 2013 are also affecting patient's functional outcome.   REHAB POTENTIAL: Fair    CLINICAL DECISION MAKING: Stable/uncomplicated  EVALUATION COMPLEXITY: Low   GOALS: Goals reviewed with patient? Yes  SHORT TERM GOALS: Target date: 01/06/2024  Pt will be independent with her initial HEP to improve R knee ROM, strength, and ability to ambulate and perform transfers with less difficulty.  Baseline: Pt has started her initial HEP (12/28/2023) Goal status: INITIAL  LONG TERM GOALS: Target date: 02/24/2024  Pt will be able to ambulate at least 500 ft without AD and no LOB to promote mobility.  Baseline: Pt currently ambulating with rw. (12/28/2023) Goal status: INITIAL  2.  Pt will improve R knee flexion AROM to 115 degrees or more and R knee extension AROM to -5 degrees or more to promote ability to ambulate, perform standing tasks, perform transfers and negotiate stairs with less  difficulty.  Baseline:  Active  Right eval R 02/03/2024  Knee flexion 56 (60 AAROM, stiff end feel) 75 degrees  Knee extension -24 (-15 AAROM), stiff end feel -15 degrees   Goal status: PROGRESSING  3.  Pt will improve her R knee flexion and extension strength by at least 1/2 MMT grade to promote ability to ambulate, perform standing tasks with less difficulty.  Baseline:  MMT Right eval R 02/03/2024  Knee flexion 3+ 4-  Knee extension 4 5   Goal status: PROGRESSING  4.  Pt will improve her LEFS score by at least 20  points as a demonstration of improved function.  Baseline: 7/80 (12/28/2023) Goal status: INITIAL  PLAN:  PT FREQUENCY: 1-2x/week  PT DURATION: 8 weeks  PLANNED INTERVENTIONS: 97110-Therapeutic exercises, 97530- Therapeutic activity, 97112- Neuromuscular re-education, 97535- Self Care, 02859- Manual therapy, [phone]- Gait training, [phone]- Electrical stimulation (unattended), [phone]- Ionotophoresis 4mg /ml Dexamethasone , Patient/Family education, Balance training, Joint mobilization, and Scar mobilization.  PLAN FOR NEXT SESSION: knee ROM, strengthening, gait, function, manual techniques, modalities PRN  Alba Perillo, PT, DPT 02/03/2024, 10:59 AM  10:59 AM, 02/03/24  Physical Therapist - [place] [phone] (Office)

## 2024-02-08 ENCOUNTER — Other Ambulatory Visit: Payer: Self-pay

## 2024-02-08 ENCOUNTER — Ambulatory Visit

## 2024-02-08 ENCOUNTER — Encounter: Payer: Self-pay | Admitting: Physician Assistant

## 2024-02-08 ENCOUNTER — Ambulatory Visit: Payer: Self-pay | Admitting: Physician Assistant

## 2024-02-08 DIAGNOSIS — M25661 Stiffness of right knee, not elsewhere classified: Secondary | ICD-10-CM

## 2024-02-08 DIAGNOSIS — M7090 Unspecified soft tissue disorder related to use, overuse and pressure of unspecified site: Secondary | ICD-10-CM

## 2024-02-08 DIAGNOSIS — G8929 Other chronic pain: Secondary | ICD-10-CM

## 2024-02-08 DIAGNOSIS — Z96651 Presence of right artificial knee joint: Secondary | ICD-10-CM

## 2024-02-08 DIAGNOSIS — R262 Difficulty in walking, not elsewhere classified: Secondary | ICD-10-CM

## 2024-02-08 MED ORDER — OXYCODONE-ACETAMINOPHEN 10-325 MG PO TABS
1.0000 | ORAL_TABLET | Freq: Four times a day (QID) | ORAL | 0 refills | Status: DC | PRN
  Filled 2024-02-08: qty 30, 8d supply, fill #0

## 2024-02-08 NOTE — Progress Notes (Signed)
 Office Visit Note   Patient: Leah Roberts           Date of Birth: Nov 17, 1950           MRN: [id number] Visit Date: 02/08/2024              Requested by: Dineen Rollene MATSU, FNP [address] [place],  [place] [address] PCP: Dineen Rollene MATSU, FNP  Chief Complaint  Patient presents with   Right Knee - Routine Post Op    11/30/2023 right total knee replacement      HPI: Patient is a 73 year old woman who is status post right total knee arthroplasty 11/30/23.  She is in PT working on ROM.  She states about 3 days ago she stood more an cook a lot.  She did do more than usual.  She has had trouble sleeping and a hard time getting comfortable.  The knee seems more stiff now as well.  She went to PT today and her flexion has decreased some now at 56 degrees with stiffness and pain.    No fever of chills.  No redness or abnormal warmth to touch.    Assessment & Plan: Visit Diagnoses:  1. S/P total knee replacement, right   2. 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  I feel like she over used her knee with increased activity.  RICE.  Continue PT .  Follow-Up Instructions: Return if symptoms worsen or fail to improve.   Ortho Exam  Patient is alert, oriented, no adenopathy, well-dressed, normal affect, normal respiratory effort. Minimal edema compared to the left knee.  Scar keloid.  Slight tenderness to scar massage.  No fluctuance in the knee joint.  No abnormal warmth to touch of the skin.      Imaging: Well maintained joint space, no lucency around implant.    Labs: Lab Results  Component Value Date   HGBA1C 6.5 09/22/2023   HGBA1C 6.1 (A) 01/12/2023   HGBA1C 6.7 (H) 05/19/2022   ESRSEDRATE 10 05/29/2020   CRP <1.0 05/29/2020     Lab Results  Component Value Date   ALBUMIN  4.0 03/08/2022   ALBUMIN  3.7 01/13/2022   ALBUMIN  3.7  12/30/2021    Lab Results  Component Value Date   MG 2.0 01/04/2022   MG 2.0 01/03/2022   MG 1.8 01/02/2022   Lab Results  Component Value Date   VD25OH 21.43 (L) 10/05/2017    No results found for: PREALBUMIN    Latest Ref Rng & Units 11/24/2023    2:07 PM 06/17/2023    2:02 PM 09/22/2022    2:52 PM  CBC EXTENDED  WBC 4.0 - 10.5 K/uL 6.7  6.5  6.3   RBC 3.87 - 5.11 MIL/uL 4.59  4.53  4.52   Hemoglobin 12.0 - 15.0 g/dL 87.4  87.5  87.5   HCT 36.0 - 46.0 % 39.8  38.8  38.3   Platelets 150 - 400 K/uL 236  239  243      There is no height or weight on file to calculate BMI.  Orders:  Orders Placed This Encounter  Procedures   XR Knee 1-2 Views Right   Meds ordered this encounter  Medications   oxyCODONE -acetaminophen  (PERCOCET) 10-325 MG tablet    Sig: Take 1 tablet by mouth every 6 (six) hours as needed for pain.  Dispense:  30 tablet    Refill:  0     Procedures: No procedures performed  Clinical Data: No additional findings.  ROS:  All other systems negative, except as noted in the HPI. Review of Systems  Objective: Vital Signs: There were no vitals taken for this visit.  Specialty Comments:  MRI LUMBAR SPINE WITHOUT CONTRAST   TECHNIQUE: Multiplanar, multisequence MR imaging of the lumbar spine was performed. No intravenous contrast was administered.   COMPARISON:  Lumbar spine radiographs 01/27/2023   FINDINGS: Segmentation:  Standard.   Alignment:  3 mm anterolisthesis of L4 on L5.   Vertebrae: No fracture or suspicious marrow lesion. Mild degenerative facet edema at L4-5.   Conus medullaris and cauda equina: Conus extends to the L1 level. Conus and cauda equina appear normal.   Paraspinal and other soft tissues: Unremarkable.   Disc levels:   T12-L1: Minimal disc bulging, small left central disc protrusion, and mild right and moderate left facet and ligamentum flavum hypertrophy without stenosis.   L1-2: Minimal disc bulging,  a shallow left paracentral to left subarticular disc protrusion, and moderate facet and ligamentum flavum hypertrophy without stenosis.   L2-3: Mild disc bulging and severe facet and ligamentum flavum hypertrophy without stenosis.   L3-4: Minimal disc bulging and severe facet and ligamentum flavum hypertrophy without stenosis.   L4-5: Disc desiccation. Anterolisthesis with mild disc uncovering and severe facet and ligamentum flavum hypertrophy without stenosis.   L5-S1: Disc desiccation and moderate disc space narrowing. Disc bulging, endplate spurring, and severe facet and ligamentum flavum hypertrophy result in mild bilateral lateral recess stenosis and moderate to severe bilateral neural foraminal stenosis without significant spinal stenosis.   IMPRESSION: 1. Severe diffuse lumbar facet arthrosis. 2. Moderate to severe bilateral neural foraminal stenosis at L5-S1. 3. No spinal stenosis.     Electronically Signed   By: Dasie Hamburg M.D.   On: 03/07/2023 08:47  PMFS History: Patient Active Problem List   Diagnosis Date Noted   S/P total knee replacement, right 12/04/2023   Total knee replacement status, right 11/30/2023   Unilateral primary osteoarthritis, right knee 11/30/2023   Anticoagulated 11/23/2023   Burn 06/27/2023   Hemorrhoid 03/23/2023   RVF (right ventricular failure) (HCC) 01/12/2023   Atopic dermatitis 01/12/2023   Shortness of breath 08/29/2022   Encounter for screening mammogram for malignant neoplasm of breast 08/29/2022   Right groin pain 01/13/2022   Bilateral pulmonary embolism with right heart strain RV/LV ratio 1:5 (HCC) 12/31/2021   Acute respiratory failure with hypoxia (HCC) 12/31/2021   Total knee replacement status, left 12/09/21 12/09/2021   Unilateral primary osteoarthritis, left knee    Shoulder disorder 09/03/2021   Arthritis of knee 09/03/2021   Lumbar radiculopathy 09/03/2021   Abnormal MRI, knee 09/03/2021   Chronic pain of right  knee 09/03/2021   Bronchitis 03/25/2021   Anemia 01/09/2020   HTN (hypertension) 12/31/2019   Chest pain 12/31/2019   DOE (dyspnea on exertion) 12/31/2019   Acute coronary syndrome (HCC) 12/31/2019   Unstable angina (HCC) 12/31/2019   Atherosclerosis 08/23/2018   Screening for tuberculosis 07/12/2018   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   Arthralgia 07/12/2018   Solitary pulmonary nodule 07/12/2018   Neck pain 02/15/2018   Dizziness 02/15/2018   Elevated TSH 02/15/2018   Screening for HIV (human immunodeficiency virus) 10/05/2017   Neuropathy 10/05/2017   Screening for colon cancer 10/05/2017   Hyperlipidemia 08/04/2016   Prediabetes 08/04/2016   Back pain 08/04/2016   Bilateral knee pain  06/18/2016   History of DVT of lower extremity 04/01/2014   History of DVT and PE (deep vein thrombosis) 05/04/2011   Past Medical History:  Diagnosis Date   Arthritis    Complication of anesthesia    Hard to wake up   DVT (deep venous thrombosis) (HCC) 2013   Dyspnea    due to knee pain   GERD (gastroesophageal reflux disease)    History of blood transfusion    History of hypertension    Hypertension    Peripheral vascular disease    DVT   Pre-diabetes    Pulmonary embolism (HCC) 2013    Family History  Problem Relation Age of Onset   Hypertension Father    Heart disease Father 11   Thyroid disease Sister    Allergies Sister    CVA Neg Hx    Heart attack Neg Hx    High Cholesterol Neg Hx    Colon cancer Neg Hx     Past Surgical History:  Procedure Laterality Date   APPENDECTOMY     PULMONARY THROMBECTOMY Bilateral 01/01/2022   Procedure: PULMONARY THROMBECTOMY;  Surgeon: Jama Cordella MATSU, MD;  Location: [facility];  Service: Cardiovascular;  Laterality: Bilateral;   TONSILLECTOMY AND ADENOIDECTOMY     TOTAL KNEE ARTHROPLASTY Left 12/09/2021   Procedure: LEFT TOTAL KNEE ARTHROPLASTY;  Surgeon: Harden Jerona GAILS, MD;  Location: [facility] OR;  Service: Orthopedics;  Laterality: Left;    TOTAL KNEE ARTHROPLASTY Right 11/30/2023   Procedure: ARTHROPLASTY, KNEE, TOTAL;  Surgeon: Harden Jerona GAILS, MD;  Location: [facility] OR;  Service: Orthopedics;  Laterality: Right;   Social History   Occupational History   Not on file  Tobacco Use   Smoking status: Never   Smokeless tobacco: Never  Vaping Use   Vaping status: Never Used  Substance and Sexual Activity   Alcohol use: Yes    Comment: Ocassional glass of wine   Drug use: No   Sexual activity: Not on file

## 2024-02-08 NOTE — Therapy (Signed)
 OUTPATIENT PHYSICAL THERAPY  TREATMENT And Progress Report (12/28/2023 - 02/08/2024)   Patient Name: Leah Roberts MRN: [id number] DOB:1950-08-27, 73 y.o., female Today's Date: 02/08/2024  END OF SESSION:  PT End of Session - 02/08/24 1124     Visit Number 10    Number of Visits 17    Date for Recertification  02/24/24    Progress Note Due on Visit 10    PT Start Time 1125    PT Stop Time 1206    PT Time Calculation (min) 41 min    Activity Tolerance Patient tolerated treatment well;Patient limited by pain    Behavior During Therapy [facility] for tasks assessed/performed                 Past Medical History:  Diagnosis Date   Arthritis    Complication of anesthesia    Hard to wake up   DVT (deep venous thrombosis) (HCC) 2013   Dyspnea    due to knee pain   GERD (gastroesophageal reflux disease)    History of blood transfusion    History of hypertension    Hypertension    Peripheral vascular disease    DVT   Pre-diabetes    Pulmonary embolism (HCC) 2013   Past Surgical History:  Procedure Laterality Date   APPENDECTOMY     PULMONARY THROMBECTOMY Bilateral 01/01/2022   Procedure: PULMONARY THROMBECTOMY;  Surgeon: Jama Cordella MATSU, MD;  Location: [facility];  Service: Cardiovascular;  Laterality: Bilateral;   TONSILLECTOMY AND ADENOIDECTOMY     TOTAL KNEE ARTHROPLASTY Left 12/09/2021   Procedure: LEFT TOTAL KNEE ARTHROPLASTY;  Surgeon: Harden Jerona GAILS, MD;  Location: [facility] OR;  Service: Orthopedics;  Laterality: Left;   TOTAL KNEE ARTHROPLASTY Right 11/30/2023   Procedure: ARTHROPLASTY, KNEE, TOTAL;  Surgeon: Harden Jerona GAILS, MD;  Location: [facility] OR;  Service: Orthopedics;  Laterality: Right;   Patient Active Problem List   Diagnosis Date Noted   S/P total knee replacement, right 12/04/2023   Total knee replacement status, right 11/30/2023   Unilateral primary osteoarthritis, right knee 11/30/2023   Anticoagulated 11/23/2023   Burn 06/27/2023    Hemorrhoid 03/23/2023   RVF (right ventricular failure) (HCC) 01/12/2023   Atopic dermatitis 01/12/2023   Shortness of breath 08/29/2022   Encounter for screening mammogram for malignant neoplasm of breast 08/29/2022   Right groin pain 01/13/2022   Bilateral pulmonary embolism with right heart strain RV/LV ratio 1:5 (HCC) 12/31/2021   Acute respiratory failure with hypoxia (HCC) 12/31/2021   Total knee replacement status, left 12/09/21 12/09/2021   Unilateral primary osteoarthritis, left knee    Shoulder disorder 09/03/2021   Arthritis of knee 09/03/2021   Lumbar radiculopathy 09/03/2021   Abnormal MRI, knee 09/03/2021   Chronic pain of right knee 09/03/2021   Bronchitis 03/25/2021   Anemia 01/09/2020   HTN (hypertension) 12/31/2019   Chest pain 12/31/2019   DOE (dyspnea on exertion) 12/31/2019   Acute coronary syndrome (HCC) 12/31/2019   Unstable angina (HCC) 12/31/2019   Atherosclerosis 08/23/2018   Screening for tuberculosis 07/12/2018   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   Arthralgia 07/12/2018   Solitary pulmonary nodule 07/12/2018   Neck pain 02/15/2018   Dizziness 02/15/2018   Elevated TSH 02/15/2018   Screening for HIV (human immunodeficiency virus) 10/05/2017   Neuropathy 10/05/2017   Screening for colon cancer 10/05/2017   Hyperlipidemia 08/04/2016   Prediabetes 08/04/2016   Back pain 08/04/2016   Bilateral knee pain 06/18/2016   History of DVT of lower  extremity 04/01/2014   History of DVT and PE (deep vein thrombosis) 05/04/2011    PCP: Dineen Rollene MATSU, FNP   REFERRING PROVIDER: Harden Jerona GAILS, MD  REFERRING DIAG: Diagnosis [phone] (ICD-10-CM) - S/P total knee replacement, right  Rationale for Evaluation and Treatment: Rehabilitation  THERAPY DIAG:  Chronic pain of right knee  Difficulty in walking, not elsewhere classified  Stiffness of right knee, not elsewhere classified  ONSET DATE: S/P R TKA on 11/30/2023  SUBJECTIVE:                                                                                                                                                                                            SUBJECTIVE STATEMENT: Has R lateral knee sharp pain, 8/10 currently, causing her not to sleep well. Also has pain in the bottom of her R foot, 6/10 currently. Sometimes does not feel her R big toe.       PERTINENT HISTORY:  S/P R TKA on 11/30/2023. Has only had to 2 PT home health follow up treatments. Sister brought her to PT today. Has been icing her knee and propping it up.  PAIN:  Are you having pain? 9/10 this morning, running late early AM and forgot her pain meds and is still stiff from overnight sleep.   PRECAUTIONS: Knee  RED FLAGS: Bowel or bladder incontinence: No and Cauda equina syndrome: No   WEIGHT BEARING RESTRICTIONS: WBAT  FALLS:  Has patient fallen in last 6 months? No  LIVING ENVIRONMENT: Lives with: sister Lives in: House/apartment Stairs: No Has following equipment at home: Environmental consultant - 2 wheeled  OCCUPATION: Retired  PLOF: Mod I with gait using SPC  PATIENT GOALS: be able to stand up and walk.   NEXT MD VISIT: 01/09/2024  OBJECTIVE:  Note: Objective measures were completed at Evaluation unless otherwise noted.   PATIENT SURVEYS   Extreme difficulty/unable (0), Quite a bit of difficulty (1), Moderate difficulty (2), Little difficulty (3), No difficulty (4) Survey date:  02/08/2024  Any of your usual work, housework or school activities 2  2. Usual hobbies, recreational or sporting activities 0  3. Getting into/out of the bath 3  4. Walking between rooms 0  5. Putting on socks/shoes 2  6. Squatting  0  7. Lifting an object, like a bag of groceries from the floor 2  8. Performing light activities around your home 2  9. Performing heavy activities around your home 0  10. Getting into/out of a car 3  11. Walking 2 blocks 0  12. Walking 1 mile 0  13. Going up/down 10 stairs (1 flight) 0  14. Standing  for 1 hour 2  15.  sitting for 1 hour 4  16. Running on even ground 0  17. Running on uneven ground 0  18. Making sharp turns while running fast 0  19. Hopping  0  20. Rolling over in bed 2  Score total:  22/80     TREATMENT 02/08/24          Finished 10 weeks post op on 02/08/2024    Neuromuscular re education  Seated R LE neural flossing 10x3  L plantar foot numbness   Seated lumbar flexion 8x10 seconds for 3 sets to decrease pressure to R LE nerves  Improved R plantar foot sensation  Decreased R plantar foot pain  Seated B heel toe raises to promote neural flossing 10x3   Standing with B UE assist   Hip abduction to help decrease R femoral adduction and IR to decrease knee pain.    R 10x3   L 10x2 (R lateral knee pain)   Standing mini squat to improve R LE strength for R LE stance phase of gait 7x3  Improved technique, movement at target joints, use of target muscles after mod verbal, visual, tactile cues.       PATIENT EDUCATION:  Education details: there-ex, HEP, POC Person educated: Patient Education method: Explanation, Demonstration, Tactile cues, Verbal cues, and Handouts Education comprehension: verbalized understanding and returned demonstration  HOME EXERCISE PROGRAM: Access Code: [email or web address] URL: [email or web address] Date: 12/28/2023 Prepared by: Emil Glassman  Exercises - Supine Quad Set  - 3 x daily - 7 x weekly - 3 sets - 10 reps - 5 seconds hold - Seated Knee Flexion AAROM  - 3 x daily - 7 x weekly - 3 sets - 10 reps - 5 seconds hold - Seated Heel Toe Raises  - 3 x daily - 7 x weekly - 3 sets - 10 reps - Seated Long Arc Quad  - 2 x daily - 7 x weekly - 2 sets - 10 reps - 5 seconds hold  - LE neural flossing  - 1 x daily - 7 x weekly - 3 sets - 10 reps  - Standing Quad Set  - 3 x daily - 7 x weekly - 3 sets - 10 reps - 5 seconds hold - Standing Hip Abduction with Counter Support  - 2 x daily - 7 x weekly - 1 sets - 8 reps - 5  seconds hold  - Seated Flexion Stretch  - 3 x daily - 7 x weekly - 3 sets - 8 reps - 10 seconds hold  - Mini Squat with Counter Support  - 1 x daily - 7 x weekly - 2 sets - 7 reps ASSESSMENT:  CLINICAL IMPRESSION: Pt demonstrates overall improving function, R knee ROM, and strength since initial evaluation. Pt currently ambulates with rw secondary to R LE weakness during stance phase of gait. Worked on R LE strengthening to help address. Progress is slow secondary to R knee joint stiffness (before and after her procedure), pain level, and low back/R LE nerve involvement. Worked on decreasing lumbar extension stress and R LE neural flossing to decrease R plantar foot pain/paresthesia today. Fair tolerance to today's session. Pt is limited by stiffness and pain. Pt will benefit from continued skilled physical therapy services to improve R knee ROM, R LE strength, function, and ability to ambulate with less difficulty.     OBJECTIVE IMPAIRMENTS: Abnormal gait, decreased balance, difficulty walking, decreased ROM, decreased strength, improper body  mechanics, postural dysfunction, and pain.   ACTIVITY LIMITATIONS: carrying, lifting, bending, standing, squatting, stairs, transfers, bed mobility, dressing, and locomotion level  PARTICIPATION LIMITATIONS:   PERSONAL FACTORS: Age, Fitness, Past/current experiences, Time since onset of injury/illness/exacerbation, and 3+ comorbidities: arthritis, hx of DVT 2013, dyspnea, HTN, PVD, PE 2013 are also affecting patient's functional outcome.   REHAB POTENTIAL: Fair    CLINICAL DECISION MAKING: Stable/uncomplicated  EVALUATION COMPLEXITY: Low   GOALS: Goals reviewed with patient? Yes  SHORT TERM GOALS: Target date: 01/06/2024  Pt will be independent with her initial HEP to improve R knee ROM, strength, and ability to ambulate and perform transfers with less difficulty.  Baseline: Pt has started her initial HEP (12/28/2023); No questions  (02/08/2024) Goal status: MET  LONG TERM GOALS: Target date: 02/24/2024  Pt will be able to ambulate at least 500 ft without AD and no LOB to promote mobility.  Baseline: Pt currently ambulating with rw. (12/28/2023);  Gait with SPC on L 20 ft, mod A. unsteady secondary to weakness; currently ambulates with rw (02/03/2024) Goal status: PROGRESSING  2.  Pt will improve R knee flexion AROM to 115 degrees or more and R knee extension AROM to -5 degrees or more to promote ability to ambulate, perform standing tasks, perform transfers and negotiate stairs with less difficulty.  Baseline:  Active  Right eval R 02/03/2024  Knee flexion 56 (60 AAROM, stiff end feel) 75 degrees  Knee extension -24 (-15 AAROM), stiff end feel -15 degrees   Goal status: PROGRESSING  3.  Pt will improve her R knee flexion and extension strength by at least 1/2 MMT grade to promote ability to ambulate, perform standing tasks with less difficulty.  Baseline:  MMT Right eval R 02/03/2024  Knee flexion 3+ 4-  Knee extension 4 5   Goal status: PROGRESSING  4.  Pt will improve her LEFS score by at least 20 points as a demonstration of improved function.  Baseline: 7/80 (12/28/2023); 22/80 (02/08/2024) Goal status: PROGRESSING  PLAN:  PT FREQUENCY: 1-2x/week  PT DURATION: 8 weeks  PLANNED INTERVENTIONS: 97110-Therapeutic exercises, 97530- Therapeutic activity, 97112- Neuromuscular re-education, 97535- Self Care, [id number]- Manual therapy, [phone]- Gait training, [phone]- Electrical stimulation (unattended), [phone]- Ionotophoresis 4mg /ml Dexamethasone , Patient/Family education, Balance training, Joint mobilization, and Scar mobilization.  PLAN FOR NEXT SESSION: knee ROM, strengthening, gait, function, manual techniques, modalities PRN   Thank you for your referral.  Demaurion Dicioccio, PT, DPT 02/08/2024, 1:05 PM  1:05 PM, 02/08/24  Physical Therapist - [place] [phone] (Office)

## 2024-02-10 ENCOUNTER — Ambulatory Visit

## 2024-02-10 DIAGNOSIS — M25661 Stiffness of right knee, not elsewhere classified: Secondary | ICD-10-CM

## 2024-02-10 DIAGNOSIS — R262 Difficulty in walking, not elsewhere classified: Secondary | ICD-10-CM

## 2024-02-10 DIAGNOSIS — G8929 Other chronic pain: Secondary | ICD-10-CM

## 2024-02-10 NOTE — Therapy (Signed)
 OUTPATIENT PHYSICAL THERAPY  TREATMENT  Patient Name: Leah Roberts MRN: [id number] DOB:1950/06/24, 73 y.o., female Today's Date: 02/10/2024  END OF SESSION:  PT End of Session - 02/10/24 1001     Visit Number 11    Number of Visits 17    Date for Recertification  02/24/24    Authorization Type Self pay    Progress Note Due on Visit 20    PT Start Time 0955    PT Stop Time 1025    PT Time Calculation (min) 30 min    Activity Tolerance Patient tolerated treatment well;Patient limited by pain;Patient limited by fatigue    Behavior During Therapy [facility] for tasks assessed/performed          Past Medical History:  Diagnosis Date   Arthritis    Complication of anesthesia    Hard to wake up   DVT (deep venous thrombosis) (HCC) 2013   Dyspnea    due to knee pain   GERD (gastroesophageal reflux disease)    History of blood transfusion    History of hypertension    Hypertension    Peripheral vascular disease    DVT   Pre-diabetes    Pulmonary embolism (HCC) 2013   Past Surgical History:  Procedure Laterality Date   APPENDECTOMY     PULMONARY THROMBECTOMY Bilateral 01/01/2022   Procedure: PULMONARY THROMBECTOMY;  Surgeon: Jama Cordella MATSU, MD;  Location: [facility] INVASIVE CV LAB;  Service: Cardiovascular;  Laterality: Bilateral;   TONSILLECTOMY AND ADENOIDECTOMY     TOTAL KNEE ARTHROPLASTY Left 12/09/2021   Procedure: LEFT TOTAL KNEE ARTHROPLASTY;  Surgeon: Harden Jerona GAILS, MD;  Location: [facility] OR;  Service: Orthopedics;  Laterality: Left;   TOTAL KNEE ARTHROPLASTY Right 11/30/2023   Procedure: ARTHROPLASTY, KNEE, TOTAL;  Surgeon: Harden Jerona GAILS, MD;  Location: [facility] OR;  Service: Orthopedics;  Laterality: Right;   PCP: Dineen Rollene MATSU, FNP REFERRING PROVIDER: Harden Jerona GAILS, MD REFERRING DIAG: Diagnosis [phone] (ICD-10-CM) - S/P total knee replacement, right  Rationale for Evaluation and Treatment: Rehabilitation  THERAPY DIAG:  Chronic pain of right knee  Difficulty in  walking, not elsewhere classified  Stiffness of right knee, not elsewhere classified  ONSET DATE: S/P R TKA on 11/30/2023  SUBJECTIVE:                                                                                                                                                                                           SUBJECTIVE STATEMENT: Pt doing ok this morning. Took her pain medications about an hour prior to session.   PERTINENT HISTORY:  S/P R TKA on 11/30/2023. Has only had  to 2 PT home health follow up treatments. Sister brought her to PT today. Has been icing her knee and propping it up.  PAIN:  Are you having pain? 7/10 this morning, running late early AM and forgot her pain meds and is still stiff from overnight sleep. Foot pain is paresthesiastic and 10/10.   PRECAUTIONS: Knee  RED FLAGS: Bowel or bladder incontinence: No and Cauda equina syndrome: No   WEIGHT BEARING RESTRICTIONS: WBAT  FALLS:  Has patient fallen in last 6 months? No  LIVING ENVIRONMENT: Lives with: sister Lives in: House/apartment Stairs: No Has following equipment at home: Environmental consultant - 2 wheeled  OCCUPATION: Retired  PLOF: Mod I with gait using SPC  PATIENT GOALS: be able to stand up and walk.   NEXT MD VISIT: 01/09/2024  OBJECTIVE:  Note: Objective measures were completed at Evaluation unless otherwise noted.  PATIENT SURVEYS Extreme difficulty/unable (0), Quite a bit of difficulty (1), Moderate difficulty (2), Little difficulty (3), No difficulty (4) Survey date:  02/08/2024  Any of your usual work, housework or school activities 2  2. Usual hobbies, recreational or sporting activities 0  3. Getting into/out of the bath 3  4. Walking between rooms 0  5. Putting on socks/shoes 2  6. Squatting  0  7. Lifting an object, like a bag of groceries from the floor 2  8. Performing light activities around your home 2  9. Performing heavy activities around your home 0  10. Getting into/out of a  car 3  11. Walking 2 blocks 0  12. Walking 1 mile 0  13. Going up/down 10 stairs (1 flight) 0  14. Standing for 1 hour 2  15.  sitting for 1 hour 4  16. Running on even ground 0  17. Running on uneven ground 0  18. Making sharp turns while running fast 0  19. Hopping  0  20. Rolling over in bed 2  Score total:  22/80   TREATMENT 02/10/24  -Nustep for AA/ROM knee flexion stretching, level 0, seat 8, arms 8: 4 minutes, then another 4 minutes after: Rt knee 75 degrees;  -overground AMB c RW x358ft: 52m 43s -seated on airex pad: RLE SAQ 1x12 -seated on airex pad: STS, hands on knees x10 -standing Rt knee flexion HS curl 1x12 (achieves less than 40 degrees, but HS gettin strong!)  -seated on 2 airex pad: RLE SAQ 1x12 -seated on airex pad: STS, hands on knees x10 -Leaning on wall, feet out about 16: double dorsiflexion x15   PATIENT EDUCATION:  Education details: there-ex, HEP, POC Person educated: Patient Education method: Explanation, Demonstration, Tactile cues, Verbal cues, and Handouts Education comprehension: verbalized understanding and returned demonstration  HOME EXERCISE PROGRAM: Access Code: [email or web address] URL: [email or web address] Date: 12/28/2023 Prepared by: Emil Glassman  Exercises - Supine Quad Set  - 3 x daily - 7 x weekly - 3 sets - 10 reps - 5 seconds hold - Seated Knee Flexion AAROM  - 3 x daily - 7 x weekly - 3 sets - 10 reps - 5 seconds hold - Seated Heel Toe Raises  - 3 x daily - 7 x weekly - 3 sets - 10 reps - Seated Long Arc Quad  - 2 x daily - 7 x weekly - 2 sets - 10 reps - 5 seconds hold - LE neural flossing  - 1 x daily - 7 x weekly - 3 sets - 10 reps - Standing Quad Set  - 3 x daily - 7  x weekly - 3 sets - 10 reps - 5 seconds hold - Standing Hip Abduction with Counter Support  - 2 x daily - 7 x weekly - 1 sets - 8 reps - 5 seconds hold - Seated Flexion Stretch  - 3 x daily - 7 x weekly - 3 sets - 8 reps - 10 seconds hold - Mini Squat with  Counter Support  - 1 x daily - 7 x weekly - 2 sets - 7 reps ASSESSMENT:  CLINICAL IMPRESSION: Knee flexion ROM to 75 degrees today, however A/ROM against gravity is close to half . AMB well tolerated in general, pt requires no cues for attempted heel toe mechanics, however this remain limited given degree of joint ROM deficits, Pt remains very motivated to maximize the dynamic potential of her new knee, her pain progressively less limiting, but she is more aware of the limitations imposed by recurrent, transient joint stiffness.Pt will benefit from continued skilled physical therapy services to improve R knee ROM, R LE strength, function, and ability to ambulate with less difficulty.   OBJECTIVE IMPAIRMENTS: Abnormal gait, decreased balance, difficulty walking, decreased ROM, decreased strength, improper body mechanics, postural dysfunction, and pain.  ACTIVITY LIMITATIONS: carrying, lifting, bending, standing, squatting, stairs, transfers, bed mobility, dressing, and locomotion level PARTICIPATION LIMITATIONS:  PERSONAL FACTORS: Age, Fitness, Past/current experiences, Time since onset of injury/illness/exacerbation, and 3+ comorbidities: arthritis, hx of DVT 2013, dyspnea, HTN, PVD, PE 2013 are also affecting patient's functional outcome.  REHAB POTENTIAL: Fair   CLINICAL DECISION MAKING: Stable/uncomplicated EVALUATION COMPLEXITY: Low   GOALS: Goals reviewed with patient? Yes  SHORT TERM GOALS: Target date: 01/06/2024  Pt will be independent with her initial HEP to improve R knee ROM, strength, and ability to ambulate and perform transfers with less difficulty.  Baseline: Pt has started her initial HEP (12/28/2023); No questions (02/08/2024) Goal status: MET  LONG TERM GOALS: Target date: 02/24/2024  Pt will be able to ambulate at least 500 ft without AD and no LOB to promote mobility.  Baseline: Pt currently ambulating with rw. (12/28/2023);  Gait with SPC on L 20 ft, mod A. unsteady  secondary to weakness; currently ambulates with rw (02/03/2024) Goal status: PROGRESSING  2.  Pt will improve R knee flexion AROM to 115 degrees or more and R knee extension AROM to -5 degrees or more to promote ability to ambulate, perform standing tasks, perform transfers and negotiate stairs with less difficulty.  Baseline:  Active  Right eval R 02/03/2024  Knee flexion 56 (60 AAROM, stiff end feel) 75 degrees  Knee extension -24 (-15 AAROM), stiff end feel -15 degrees   Goal status: PROGRESSING  3.  Pt will improve her R knee flexion and extension strength by at least 1/2 MMT grade to promote ability to ambulate, perform standing tasks with less difficulty.  Baseline:  MMT Right eval R 02/03/2024  Knee flexion 3+ 4-  Knee extension 4 5   Goal status: PROGRESSING  4.  Pt will improve her LEFS score by at least 20 points as a demonstration of improved function.  Baseline: 7/80 (12/28/2023); 22/80 (02/08/2024) Goal status: PROGRESSING  PLAN:  PT FREQUENCY: 1-2x/week  PT DURATION: 8 weeks  PLANNED INTERVENTIONS: 97110-Therapeutic exercises, 97530- Therapeutic activity, 97112- Neuromuscular re-education, 97535- Self Care, 02859- Manual therapy, [phone]- Gait training, [phone]- Electrical stimulation (unattended), [phone]- Ionotophoresis 4mg /ml Dexamethasone , Patient/Family education, Balance training, Joint mobilization, and Scar mobilization.  PLAN FOR NEXT SESSION: knee ROM, strengthening, gait, function, manual techniques, modalities PRN  10:27 AM, 02/10/24 Peggye JAYSON Linear, PT, DPT Physical Therapist - [place] [phone] (Office)

## 2024-02-13 ENCOUNTER — Ambulatory Visit

## 2024-02-13 DIAGNOSIS — M25661 Stiffness of right knee, not elsewhere classified: Secondary | ICD-10-CM

## 2024-02-13 DIAGNOSIS — R262 Difficulty in walking, not elsewhere classified: Secondary | ICD-10-CM

## 2024-02-13 DIAGNOSIS — G8929 Other chronic pain: Secondary | ICD-10-CM

## 2024-02-13 NOTE — Therapy (Signed)
 OUTPATIENT PHYSICAL THERAPY  TREATMENT   Patient Name: Leah Roberts MRN: [id number] DOB:1950/12/24, 73 y.o., female Today's Date: 02/13/2024  END OF SESSION:  PT End of Session - 02/13/24 1532     Visit Number 12    Number of Visits 17    Date for Recertification  02/24/24    Authorization Type Self pay    Progress Note Due on Visit 20    PT Start Time 1532    PT Stop Time 1611    PT Time Calculation (min) 39 min    Activity Tolerance Patient tolerated treatment well;Patient limited by pain;Patient limited by fatigue    Behavior During Therapy [facility] for tasks assessed/performed                  Past Medical History:  Diagnosis Date   Arthritis    Complication of anesthesia    Hard to wake up   DVT (deep venous thrombosis) (HCC) 2013   Dyspnea    due to knee pain   GERD (gastroesophageal reflux disease)    History of blood transfusion    History of hypertension    Hypertension    Peripheral vascular disease    DVT   Pre-diabetes    Pulmonary embolism (HCC) 2013   Past Surgical History:  Procedure Laterality Date   APPENDECTOMY     PULMONARY THROMBECTOMY Bilateral 01/01/2022   Procedure: PULMONARY THROMBECTOMY;  Surgeon: Jama Cordella MATSU, MD;  Location: [facility] INVASIVE CV LAB;  Service: Cardiovascular;  Laterality: Bilateral;   TONSILLECTOMY AND ADENOIDECTOMY     TOTAL KNEE ARTHROPLASTY Left 12/09/2021   Procedure: LEFT TOTAL KNEE ARTHROPLASTY;  Surgeon: Harden Jerona GAILS, MD;  Location: [facility] OR;  Service: Orthopedics;  Laterality: Left;   TOTAL KNEE ARTHROPLASTY Right 11/30/2023   Procedure: ARTHROPLASTY, KNEE, TOTAL;  Surgeon: Harden Jerona GAILS, MD;  Location: [facility] OR;  Service: Orthopedics;  Laterality: Right;   Patient Active Problem List   Diagnosis Date Noted   S/P total knee replacement, right 12/04/2023   Total knee replacement status, right 11/30/2023   Unilateral primary osteoarthritis, right knee 11/30/2023   Anticoagulated 11/23/2023   Burn  06/27/2023   Hemorrhoid 03/23/2023   RVF (right ventricular failure) (HCC) 01/12/2023   Atopic dermatitis 01/12/2023   Shortness of breath 08/29/2022   Encounter for screening mammogram for malignant neoplasm of breast 08/29/2022   Right groin pain 01/13/2022   Bilateral pulmonary embolism with right heart strain RV/LV ratio 1:5 (HCC) 12/31/2021   Acute respiratory failure with hypoxia (HCC) 12/31/2021   Total knee replacement status, left 12/09/21 12/09/2021   Unilateral primary osteoarthritis, left knee    Shoulder disorder 09/03/2021   Arthritis of knee 09/03/2021   Lumbar radiculopathy 09/03/2021   Abnormal MRI, knee 09/03/2021   Chronic pain of right knee 09/03/2021   Bronchitis 03/25/2021   Anemia 01/09/2020   HTN (hypertension) 12/31/2019   Chest pain 12/31/2019   DOE (dyspnea on exertion) 12/31/2019   Acute coronary syndrome (HCC) 12/31/2019   Unstable angina (HCC) 12/31/2019   Atherosclerosis 08/23/2018   Screening for tuberculosis 07/12/2018   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   Arthralgia 07/12/2018   Solitary pulmonary nodule 07/12/2018   Neck pain 02/15/2018   Dizziness 02/15/2018   Elevated TSH 02/15/2018   Screening for HIV (human immunodeficiency virus) 10/05/2017   Neuropathy 10/05/2017   Screening for colon cancer 10/05/2017   Hyperlipidemia 08/04/2016   Prediabetes 08/04/2016   Back pain 08/04/2016   Bilateral knee pain 06/18/2016  History of DVT of lower extremity 04/01/2014   History of DVT and PE (deep vein thrombosis) 05/04/2011    PCP: Dineen Rollene MATSU, FNP   REFERRING PROVIDER: Harden Jerona GAILS, MD  REFERRING DIAG: Diagnosis [phone] (ICD-10-CM) - S/P total knee replacement, right  Rationale for Evaluation and Treatment: Rehabilitation  THERAPY DIAG:  Chronic pain of right knee  Difficulty in walking, not elsewhere classified  Stiffness of right knee, not elsewhere classified  ONSET DATE: S/P R TKA on 11/30/2023  SUBJECTIVE:                                                                                                                                                                                            SUBJECTIVE STATEMENT: R knee is stiff and pain, 8/10 currently.      PERTINENT HISTORY:  S/P R TKA on 11/30/2023. Has only had to 2 PT home health follow up treatments. Sister brought her to PT today. Has been icing her knee and propping it up.  PAIN:  Are you having pain? 9/10 this morning, running late early AM and forgot her pain meds and is still stiff from overnight sleep.   PRECAUTIONS: Knee  RED FLAGS: Bowel or bladder incontinence: No and Cauda equina syndrome: No   WEIGHT BEARING RESTRICTIONS: WBAT  FALLS:  Has patient fallen in last 6 months? No  LIVING ENVIRONMENT: Lives with: sister Lives in: House/apartment Stairs: No Has following equipment at home: Environmental consultant - 2 wheeled  OCCUPATION: Retired  PLOF: Mod I with gait using SPC  PATIENT GOALS: be able to stand up and walk.   NEXT MD VISIT: 01/09/2024  OBJECTIVE:  Note: Objective measures were completed at Evaluation unless otherwise noted.   PATIENT SURVEYS   Extreme difficulty/unable (0), Quite a bit of difficulty (1), Moderate difficulty (2), Little difficulty (3), No difficulty (4) Survey date:  02/08/2024  Any of your usual work, housework or school activities 2  2. Usual hobbies, recreational or sporting activities 0  3. Getting into/out of the bath 3  4. Walking between rooms 0  5. Putting on socks/shoes 2  6. Squatting  0  7. Lifting an object, like a bag of groceries from the floor 2  8. Performing light activities around your home 2  9. Performing heavy activities around your home 0  10. Getting into/out of a car 3  11. Walking 2 blocks 0  12. Walking 1 mile 0  13. Going up/down 10 stairs (1 flight) 0  14. Standing for 1 hour 2  15.  sitting for 1 hour 4  16. Running on even ground 0  17. Running on uneven ground 0  18. Making  sharp turns while running fast 0  19. Hopping  0  20. Rolling over in bed 2  Score total:  22/80     TREATMENT 02/13/24          Finished 11 weeks post op on 02/15/2024    Therapeutic activity  Nustep seat 8, arms 8 level 1 for 5 minutes for joint nutrition and decreased R knee joint stiffness.   Forward step up onto 4 inch step with B UE assist   R 10x3  Standing knee flexion with B UE assist   R 10x, then 5x2  Standing gastroc stretch with B UE assist with cues for knee extension   R 30 seconds x 2   Standing R TKE with B UE assist 10x5 seconds   Sit <> stand from Nustep chair without UE assist PRN 10x2  CGA  Standing with B UE assist   Hip abduction    R 10x5 seconds    L 5x5 seconds for 2 sets  Pt states decreased R knee stiffness and improved standing strength after session    Improved technique, movement at target joints, use of target muscles after mod verbal, visual, tactile cues.       PATIENT EDUCATION:  Education details: there-ex, HEP, POC Person educated: Patient Education method: Explanation, Demonstration, Tactile cues, Verbal cues, and Handouts Education comprehension: verbalized understanding and returned demonstration  HOME EXERCISE PROGRAM: Access Code: [email or web address] URL: [email or web address] Date: 12/28/2023 Prepared by: Emil Glassman  Exercises - Supine Quad Set  - 3 x daily - 7 x weekly - 3 sets - 10 reps - 5 seconds hold - Seated Knee Flexion AAROM  - 3 x daily - 7 x weekly - 3 sets - 10 reps - 5 seconds hold - Seated Heel Toe Raises  - 3 x daily - 7 x weekly - 3 sets - 10 reps - Seated Long Arc Quad  - 2 x daily - 7 x weekly - 2 sets - 10 reps - 5 seconds hold  - LE neural flossing  - 1 x daily - 7 x weekly - 3 sets - 10 reps  - Standing Quad Set  - 3 x daily - 7 x weekly - 3 sets - 10 reps - 5 seconds hold - Standing Hip Abduction with Counter Support  - 2 x daily - 7 x weekly - 1 sets - 8 reps - 5 seconds hold  -  Seated Flexion Stretch  - 3 x daily - 7 x weekly - 3 sets - 8 reps - 10 seconds hold  - Mini Squat with Counter Support  - 1 x daily - 7 x weekly - 2 sets - 7 reps ASSESSMENT:  CLINICAL IMPRESSION: Focused on R LE strengthening today to promote ability to ambulate with less restrictive AD and transition to AD when appropriate in conjunction with improving R knee flexion and extension ROM. Decreased R knee stiffness and improved R LE stance strength reported after treatment. Pt will benefit from continued skilled physical therapy services to improve R knee ROM, R LE strength, function, and ability to ambulate with less difficulty.     OBJECTIVE IMPAIRMENTS: Abnormal gait, decreased balance, difficulty walking, decreased ROM, decreased strength, improper body mechanics, postural dysfunction, and pain.   ACTIVITY LIMITATIONS: carrying, lifting, bending, standing, squatting, stairs, transfers, bed mobility, dressing, and locomotion level  PARTICIPATION LIMITATIONS:   PERSONAL FACTORS: Age, Fitness, Past/current experiences, Time since onset  of injury/illness/exacerbation, and 3+ comorbidities: arthritis, hx of DVT 2013, dyspnea, HTN, PVD, PE 2013 are also affecting patient's functional outcome.   REHAB POTENTIAL: Fair    CLINICAL DECISION MAKING: Stable/uncomplicated  EVALUATION COMPLEXITY: Low   GOALS: Goals reviewed with patient? Yes  SHORT TERM GOALS: Target date: 01/06/2024  Pt will be independent with her initial HEP to improve R knee ROM, strength, and ability to ambulate and perform transfers with less difficulty.  Baseline: Pt has started her initial HEP (12/28/2023); No questions (02/08/2024) Goal status: MET  LONG TERM GOALS: Target date: 02/24/2024  Pt will be able to ambulate at least 500 ft without AD and no LOB to promote mobility.  Baseline: Pt currently ambulating with rw. (12/28/2023);  Gait with SPC on L 20 ft, mod A. unsteady secondary to weakness; currently ambulates  with rw (02/03/2024) Goal status: PROGRESSING  2.  Pt will improve R knee flexion AROM to 115 degrees or more and R knee extension AROM to -5 degrees or more to promote ability to ambulate, perform standing tasks, perform transfers and negotiate stairs with less difficulty.  Baseline:  Active  Right eval R 02/03/2024  Knee flexion 56 (60 AAROM, stiff end feel) 75 degrees  Knee extension -24 (-15 AAROM), stiff end feel -15 degrees   Goal status: PROGRESSING  3.  Pt will improve her R knee flexion and extension strength by at least 1/2 MMT grade to promote ability to ambulate, perform standing tasks with less difficulty.  Baseline:  MMT Right eval R 02/03/2024  Knee flexion 3+ 4-  Knee extension 4 5   Goal status: PROGRESSING  4.  Pt will improve her LEFS score by at least 20 points as a demonstration of improved function.  Baseline: 7/80 (12/28/2023); 22/80 (02/08/2024) Goal status: PROGRESSING  PLAN:  PT FREQUENCY: 1-2x/week  PT DURATION: 8 weeks  PLANNED INTERVENTIONS: 97110-Therapeutic exercises, 97530- Therapeutic activity, 97112- Neuromuscular re-education, 97535- Self Care, 02859- Manual therapy, [phone]- Gait training, [phone]- Electrical stimulation (unattended), [phone]- Ionotophoresis 4mg /ml Dexamethasone , Patient/Family education, Balance training, Joint mobilization, and Scar mobilization.  PLAN FOR NEXT SESSION: knee ROM, strengthening, gait, function, manual techniques, modalities PRN    Volanda Mangine, PT, DPT 02/13/2024, 4:20 PM  4:20 PM, 02/13/24  Physical Therapist - [place] [phone] (Office)

## 2024-02-15 ENCOUNTER — Encounter

## 2024-02-16 NOTE — Therapy (Incomplete)
 OUTPATIENT PHYSICAL THERAPY  TREATMENT   Patient Name: Leah Roberts MRN: [id number] DOB:1950/05/10, 73 y.o., female Today's Date: 02/16/2024  END OF SESSION:            Past Medical History:  Diagnosis Date   Arthritis    Complication of anesthesia    Hard to wake up   DVT (deep venous thrombosis) (HCC) 2013   Dyspnea    due to knee pain   GERD (gastroesophageal reflux disease)    History of blood transfusion    History of hypertension    Hypertension    Peripheral vascular disease    DVT   Pre-diabetes    Pulmonary embolism (HCC) 2013   Past Surgical History:  Procedure Laterality Date   APPENDECTOMY     PULMONARY THROMBECTOMY Bilateral 01/01/2022   Procedure: PULMONARY THROMBECTOMY;  Surgeon: Jama Cordella MATSU, MD;  Location: [facility] INVASIVE CV LAB;  Service: Cardiovascular;  Laterality: Bilateral;   TONSILLECTOMY AND ADENOIDECTOMY     TOTAL KNEE ARTHROPLASTY Left 12/09/2021   Procedure: LEFT TOTAL KNEE ARTHROPLASTY;  Surgeon: Harden Jerona GAILS, MD;  Location: [facility] OR;  Service: Orthopedics;  Laterality: Left;   TOTAL KNEE ARTHROPLASTY Right 11/30/2023   Procedure: ARTHROPLASTY, KNEE, TOTAL;  Surgeon: Harden Jerona GAILS, MD;  Location: [facility] OR;  Service: Orthopedics;  Laterality: Right;   Patient Active Problem List   Diagnosis Date Noted   S/P total knee replacement, right 12/04/2023   Total knee replacement status, right 11/30/2023   Unilateral primary osteoarthritis, right knee 11/30/2023   Anticoagulated 11/23/2023   Burn 06/27/2023   Hemorrhoid 03/23/2023   RVF (right ventricular failure) (HCC) 01/12/2023   Atopic dermatitis 01/12/2023   Shortness of breath 08/29/2022   Encounter for screening mammogram for malignant neoplasm of breast 08/29/2022   Right groin pain 01/13/2022   Bilateral pulmonary embolism with right heart strain RV/LV ratio 1:5 (HCC) 12/31/2021   Acute respiratory failure with hypoxia (HCC) 12/31/2021   Total knee replacement status, left  12/09/21 12/09/2021   Unilateral primary osteoarthritis, left knee    Shoulder disorder 09/03/2021   Arthritis of knee 09/03/2021   Lumbar radiculopathy 09/03/2021   Abnormal MRI, knee 09/03/2021   Chronic pain of right knee 09/03/2021   Bronchitis 03/25/2021   Anemia 01/09/2020   HTN (hypertension) 12/31/2019   Chest pain 12/31/2019   DOE (dyspnea on exertion) 12/31/2019   Acute coronary syndrome (HCC) 12/31/2019   Unstable angina (HCC) 12/31/2019   Atherosclerosis 08/23/2018   Screening for tuberculosis 07/12/2018   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   Arthralgia 07/12/2018   Solitary pulmonary nodule 07/12/2018   Neck pain 02/15/2018   Dizziness 02/15/2018   Elevated TSH 02/15/2018   Screening for HIV (human immunodeficiency virus) 10/05/2017   Neuropathy 10/05/2017   Screening for colon cancer 10/05/2017   Hyperlipidemia 08/04/2016   Prediabetes 08/04/2016   Back pain 08/04/2016   Bilateral knee pain 06/18/2016   History of DVT of lower extremity 04/01/2014   History of DVT and PE (deep vein thrombosis) 05/04/2011    PCP: Dineen Rollene MATSU, FNP   REFERRING PROVIDER: Harden Jerona GAILS, MD  REFERRING DIAG: Diagnosis [phone] (ICD-10-CM) - S/P total knee replacement, right  Rationale for Evaluation and Treatment: Rehabilitation  THERAPY DIAG:  Chronic pain of right knee  Difficulty in walking, not elsewhere classified  Stiffness of right knee, not elsewhere classified  ONSET DATE: S/P R TKA on 11/30/2023  SUBJECTIVE:  SUBJECTIVE STATEMENT: R knee is stiff and pain, 8/10 currently.   ***    PERTINENT HISTORY:  S/P R TKA on 11/30/2023. Has only had to 2 PT home health follow up treatments. Sister brought her to PT today. Has been icing her knee and propping it up.  PAIN:  Are you having  pain? 9/10 this morning, running late early AM and forgot her pain meds and is still stiff from overnight sleep.   PRECAUTIONS: Knee  RED FLAGS: Bowel or bladder incontinence: No and Cauda equina syndrome: No   WEIGHT BEARING RESTRICTIONS: WBAT  FALLS:  Has patient fallen in last 6 months? No  LIVING ENVIRONMENT: Lives with: sister Lives in: House/apartment Stairs: No Has following equipment at home: Environmental consultant - 2 wheeled  OCCUPATION: Retired  PLOF: Mod I with gait using SPC  PATIENT GOALS: be able to stand up and walk.   NEXT MD VISIT: 01/09/2024  OBJECTIVE:  Note: Objective measures were completed at Evaluation unless otherwise noted.   PATIENT SURVEYS   Extreme difficulty/unable (0), Quite a bit of difficulty (1), Moderate difficulty (2), Little difficulty (3), No difficulty (4) Survey date:  02/08/2024  Any of your usual work, housework or school activities 2  2. Usual hobbies, recreational or sporting activities 0  3. Getting into/out of the bath 3  4. Walking between rooms 0  5. Putting on socks/shoes 2  6. Squatting  0  7. Lifting an object, like a bag of groceries from the floor 2  8. Performing light activities around your home 2  9. Performing heavy activities around your home 0  10. Getting into/out of a car 3  11. Walking 2 blocks 0  12. Walking 1 mile 0  13. Going up/down 10 stairs (1 flight) 0  14. Standing for 1 hour 2  15.  sitting for 1 hour 4  16. Running on even ground 0  17. Running on uneven ground 0  18. Making sharp turns while running fast 0  19. Hopping  0  20. Rolling over in bed 2  Score total:  22/80     TREATMENT 02/16/24         ***  Finished 11 weeks post op on 02/15/2024    Therapeutic activity  Nustep seat 8, arms 8 level 1 for 5 minutes for joint nutrition and decreased R knee joint stiffness.   Forward step up onto 4 inch step with B UE assist   R 10x3  Standing knee flexion with B UE assist   R 10x, then  5x2  Standing gastroc stretch with B UE assist with cues for knee extension   R 30 seconds x 2   Standing R TKE with B UE assist 10x5 seconds   Sit <> stand from Nustep chair without UE assist PRN 10x2  CGA  Standing with B UE assist   Hip abduction    R 10x5 seconds    L 5x5 seconds for 2 sets  Pt states decreased R knee stiffness and improved standing strength after session    Improved technique, movement at target joints, use of target muscles after mod verbal, visual, tactile cues.       PATIENT EDUCATION:  Education details: there-ex, HEP, POC Person educated: Patient Education method: Explanation, Demonstration, Tactile cues, Verbal cues, and Handouts Education comprehension: verbalized understanding and returned demonstration  HOME EXERCISE PROGRAM: Access Code: [email or web address] URL: [email or web address] Date: 12/28/2023 Prepared by: Emil Glassman  Exercises - Supine Quad Set  -  3 x daily - 7 x weekly - 3 sets - 10 reps - 5 seconds hold - Seated Knee Flexion AAROM  - 3 x daily - 7 x weekly - 3 sets - 10 reps - 5 seconds hold - Seated Heel Toe Raises  - 3 x daily - 7 x weekly - 3 sets - 10 reps - Seated Long Arc Quad  - 2 x daily - 7 x weekly - 2 sets - 10 reps - 5 seconds hold  - LE neural flossing  - 1 x daily - 7 x weekly - 3 sets - 10 reps  - Standing Quad Set  - 3 x daily - 7 x weekly - 3 sets - 10 reps - 5 seconds hold - Standing Hip Abduction with Counter Support  - 2 x daily - 7 x weekly - 1 sets - 8 reps - 5 seconds hold  - Seated Flexion Stretch  - 3 x daily - 7 x weekly - 3 sets - 8 reps - 10 seconds hold  - Mini Squat with Counter Support  - 1 x daily - 7 x weekly - 2 sets - 7 reps ASSESSMENT:  CLINICAL IMPRESSION: ***  Focused on R LE strengthening today to promote ability to ambulate with less restrictive AD and transition to AD when appropriate in conjunction with improving R knee flexion and extension ROM. Decreased R knee stiffness and  improved R LE stance strength reported after treatment. Pt will benefit from continued skilled physical therapy services to improve R knee ROM, R LE strength, function, and ability to ambulate with less difficulty.     OBJECTIVE IMPAIRMENTS: Abnormal gait, decreased balance, difficulty walking, decreased ROM, decreased strength, improper body mechanics, postural dysfunction, and pain.   ACTIVITY LIMITATIONS: carrying, lifting, bending, standing, squatting, stairs, transfers, bed mobility, dressing, and locomotion level  PARTICIPATION LIMITATIONS:   PERSONAL FACTORS: Age, Fitness, Past/current experiences, Time since onset of injury/illness/exacerbation, and 3+ comorbidities: arthritis, hx of DVT 2013, dyspnea, HTN, PVD, PE 2013 are also affecting patient's functional outcome.   REHAB POTENTIAL: Fair    CLINICAL DECISION MAKING: Stable/uncomplicated  EVALUATION COMPLEXITY: Low   GOALS: Goals reviewed with patient? Yes  SHORT TERM GOALS: Target date: 01/06/2024  Pt will be independent with her initial HEP to improve R knee ROM, strength, and ability to ambulate and perform transfers with less difficulty.  Baseline: Pt has started her initial HEP (12/28/2023); No questions (02/08/2024) Goal status: MET  LONG TERM GOALS: Target date: 02/24/2024  Pt will be able to ambulate at least 500 ft without AD and no LOB to promote mobility.  Baseline: Pt currently ambulating with rw. (12/28/2023);  Gait with SPC on L 20 ft, mod A. unsteady secondary to weakness; currently ambulates with rw (02/03/2024) Goal status: PROGRESSING  2.  Pt will improve R knee flexion AROM to 115 degrees or more and R knee extension AROM to -5 degrees or more to promote ability to ambulate, perform standing tasks, perform transfers and negotiate stairs with less difficulty.  Baseline:  Active  Right eval R 02/03/2024  Knee flexion 56 (60 AAROM, stiff end feel) 75 degrees  Knee extension -24 (-15 AAROM), stiff end feel  -15 degrees   Goal status: PROGRESSING  3.  Pt will improve her R knee flexion and extension strength by at least 1/2 MMT grade to promote ability to ambulate, perform standing tasks with less difficulty.  Baseline:  MMT Right eval R 02/03/2024  Knee flexion 3+ 4-  Knee extension 4 5   Goal status: PROGRESSING  4.  Pt will improve her LEFS score by at least 20 points as a demonstration of improved function.  Baseline: 7/80 (12/28/2023); 22/80 (02/08/2024) Goal status: PROGRESSING  PLAN:  PT FREQUENCY: 1-2x/week  PT DURATION: 8 weeks  PLANNED INTERVENTIONS: 97110-Therapeutic exercises, 97530- Therapeutic activity, 97112- Neuromuscular re-education, 97535- Self Care, 02859- Manual therapy, [phone]- Gait training, [phone]- Electrical stimulation (unattended), [phone]- Ionotophoresis 4mg /ml Dexamethasone , Patient/Family education, Balance training, Joint mobilization, and Scar mobilization.  PLAN FOR NEXT SESSION: knee ROM, strengthening, gait, function, manual techniques, modalities PRN    Maryanne DELENA Finder, PT, DPT 02/16/2024, 4:48 PM  4:48 PM, 02/16/24  Physical Therapist - [place] [phone] (Office)

## 2024-02-17 ENCOUNTER — Ambulatory Visit

## 2024-02-20 ENCOUNTER — Ambulatory Visit

## 2024-02-20 DIAGNOSIS — R262 Difficulty in walking, not elsewhere classified: Secondary | ICD-10-CM

## 2024-02-20 DIAGNOSIS — M25661 Stiffness of right knee, not elsewhere classified: Secondary | ICD-10-CM

## 2024-02-20 DIAGNOSIS — G8929 Other chronic pain: Secondary | ICD-10-CM

## 2024-02-22 ENCOUNTER — Ambulatory Visit

## 2024-02-22 DIAGNOSIS — R262 Difficulty in walking, not elsewhere classified: Secondary | ICD-10-CM

## 2024-02-22 DIAGNOSIS — M25661 Stiffness of right knee, not elsewhere classified: Secondary | ICD-10-CM

## 2024-02-22 DIAGNOSIS — G8929 Other chronic pain: Secondary | ICD-10-CM

## 2024-02-22 NOTE — Therapy (Signed)
 OUTPATIENT PHYSICAL THERAPY  TREATMENT   Patient Name: Leah Roberts MRN: [id number] DOB:1950/05/20, 73 y.o., female Today's Date: 02/22/2024  END OF SESSION:  PT End of Session - 02/22/24 1121     Visit Number 13    Number of Visits 27    Date for Recertification  03/30/24    Authorization Type Self pay    Progress Note Due on Visit 20    PT Start Time 1121    PT Stop Time 1201    PT Time Calculation (min) 40 min    Activity Tolerance Patient tolerated treatment well;Patient limited by pain;Patient limited by fatigue    Behavior During Therapy [facility] for tasks assessed/performed                   Past Medical History:  Diagnosis Date   Arthritis    Complication of anesthesia    Hard to wake up   DVT (deep venous thrombosis) (HCC) 2013   Dyspnea    due to knee pain   GERD (gastroesophageal reflux disease)    History of blood transfusion    History of hypertension    Hypertension    Peripheral vascular disease    DVT   Pre-diabetes    Pulmonary embolism (HCC) 2013   Past Surgical History:  Procedure Laterality Date   APPENDECTOMY     PULMONARY THROMBECTOMY Bilateral 01/01/2022   Procedure: PULMONARY THROMBECTOMY;  Surgeon: Jama Cordella MATSU, MD;  Location: [facility] INVASIVE CV LAB;  Service: Cardiovascular;  Laterality: Bilateral;   TONSILLECTOMY AND ADENOIDECTOMY     TOTAL KNEE ARTHROPLASTY Left 12/09/2021   Procedure: LEFT TOTAL KNEE ARTHROPLASTY;  Surgeon: Harden Jerona GAILS, MD;  Location: [facility] OR;  Service: Orthopedics;  Laterality: Left;   TOTAL KNEE ARTHROPLASTY Right 11/30/2023   Procedure: ARTHROPLASTY, KNEE, TOTAL;  Surgeon: Harden Jerona GAILS, MD;  Location: [facility] OR;  Service: Orthopedics;  Laterality: Right;   Patient Active Problem List   Diagnosis Date Noted   S/P total knee replacement, right 12/04/2023   Total knee replacement status, right 11/30/2023   Unilateral primary osteoarthritis, right knee 11/30/2023   Anticoagulated 11/23/2023   Burn  06/27/2023   Hemorrhoid 03/23/2023   RVF (right ventricular failure) (HCC) 01/12/2023   Atopic dermatitis 01/12/2023   Shortness of breath 08/29/2022   Encounter for screening mammogram for malignant neoplasm of breast 08/29/2022   Right groin pain 01/13/2022   Bilateral pulmonary embolism with right heart strain RV/LV ratio 1:5 (HCC) 12/31/2021   Acute respiratory failure with hypoxia (HCC) 12/31/2021   Total knee replacement status, left 12/09/21 12/09/2021   Unilateral primary osteoarthritis, left knee    Shoulder disorder 09/03/2021   Arthritis of knee 09/03/2021   Lumbar radiculopathy 09/03/2021   Abnormal MRI, knee 09/03/2021   Chronic pain of right knee 09/03/2021   Bronchitis 03/25/2021   Anemia 01/09/2020   HTN (hypertension) 12/31/2019   Chest pain 12/31/2019   DOE (dyspnea on exertion) 12/31/2019   Acute coronary syndrome (HCC) 12/31/2019   Unstable angina (HCC) 12/31/2019   Atherosclerosis 08/23/2018   Screening for tuberculosis 07/12/2018   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   Arthralgia 07/12/2018   Solitary pulmonary nodule 07/12/2018   Neck pain 02/15/2018   Dizziness 02/15/2018   Elevated TSH 02/15/2018   Screening for HIV (human immunodeficiency virus) 10/05/2017   Neuropathy 10/05/2017   Screening for colon cancer 10/05/2017   Hyperlipidemia 08/04/2016   Prediabetes 08/04/2016   Back pain 08/04/2016   Bilateral knee pain 06/18/2016  History of DVT of lower extremity 04/01/2014   History of DVT and PE (deep vein thrombosis) 05/04/2011    PCP: Dineen Rollene MATSU, FNP   REFERRING PROVIDER: Harden Jerona GAILS, MD  REFERRING DIAG: Diagnosis [phone] (ICD-10-CM) - S/P total knee replacement, right  Rationale for Evaluation and Treatment: Rehabilitation  THERAPY DIAG:  Chronic pain of right knee - Plan: PT plan of care cert/re-cert  Difficulty in walking, not elsewhere classified - Plan: PT plan of care cert/re-cert  Stiffness of right knee, not elsewhere  classified - Plan: PT plan of care cert/re-cert  ONSET DATE: S/P R TKA on 11/30/2023  SUBJECTIVE:                                                                                                                                                                                           SUBJECTIVE STATEMENT: Last week, missed PT because her R knee was very painful and the other missed visit was because she did not have a ride. 7/10 R knee pain at start of session.   Moving to [place]  this December 2025 for good to help her son with his 4 kids. The bedrooms are on the 2nd floor.     PERTINENT HISTORY:  S/P R TKA on 11/30/2023. Has only had to 2 PT home health follow up treatments. Sister brought her to PT today. Has been icing her knee and propping it up.  PAIN:  Are you having pain? 9/10 this morning, running late early AM and forgot her pain meds and is still stiff from overnight sleep.   PRECAUTIONS: Knee  RED FLAGS: Bowel or bladder incontinence: No and Cauda equina syndrome: No   WEIGHT BEARING RESTRICTIONS: WBAT  FALLS:  Has patient fallen in last 6 months? No  LIVING ENVIRONMENT: Lives with: sister Lives in: House/apartment Stairs: No Has following equipment at home: Environmental consultant - 2 wheeled  OCCUPATION: Retired  PLOF: Mod I with gait using SPC  PATIENT GOALS: be able to stand up and walk.   NEXT MD VISIT: 01/09/2024  OBJECTIVE:  Note: Objective measures were completed at Evaluation unless otherwise noted.   PATIENT SURVEYS   Extreme difficulty/unable (0), Quite a bit of difficulty (1), Moderate difficulty (2), Little difficulty (3), No difficulty (4) Survey date:  02/08/2024  Any of your usual work, housework or school activities 2  2. Usual hobbies, recreational or sporting activities 0  3. Getting into/out of the bath 3  4. Walking between rooms 0  5. Putting on socks/shoes 2  6. Squatting  0  7. Lifting an object, like a bag of groceries from the floor 2  8.  Performing light activities around your home 2  9. Performing heavy activities around your home 0  10. Getting into/out of a car 3  11. Walking 2 blocks 0  12. Walking 1 mile 0  13. Going up/down 10 stairs (1 flight) 0  14. Standing for 1 hour 2  15.  sitting for 1 hour 4  16. Running on even ground 0  17. Running on uneven ground 0  18. Making sharp turns while running fast 0  19. Hopping  0  20. Rolling over in bed 2  Score total:  22/80     TREATMENT 02/22/24          Finished 12 weeks post op on 02/22/2024    Therapeutic activity  Nustep seat 10, arms 8 level 1 for 5 minutes for joint nutrition and decreased R knee joint stiffness.   Seated R knee flexion and extension AROM  Manually resisted seated R knee flexion and extension 1x each way  Reviewed progress/current status with PT towards goals.   Standing knee flexion stretch onto first stair step with B UE assist 10x5 seconds for 3 sets  76 degrees AAROM during 3rd set  Standing R gastroc stretch 10x5 seconds with B UE assist   Forward step up onto first regular step with B UE assist   R 10x2  Ascending and descending 4 regular steps with B UE assist with railing with up with the good and down with the bad technique 3x  Gait with SPC on L side 20 ft, CGA to min A  Then 15 ft CGA   Improved technique, movement at target joints, use of target muscles after mod verbal, visual, tactile cues.       PATIENT EDUCATION:  Education details: there-ex, HEP, POC Person educated: Patient Education method: Explanation, Demonstration, Tactile cues, Verbal cues, and Handouts Education comprehension: verbalized understanding and returned demonstration  HOME EXERCISE PROGRAM: Access Code: [email or web address] URL: [email or web address] Date: 12/28/2023 Prepared by: Emil Glassman  Exercises - Supine Quad Set  - 3 x daily - 7 x weekly - 3 sets - 10 reps - 5 seconds hold - Seated Knee Flexion AAROM  - 3 x daily - 7  x weekly - 3 sets - 10 reps - 5 seconds hold - Seated Heel Toe Raises  - 3 x daily - 7 x weekly - 3 sets - 10 reps - Seated Long Arc Quad  - 2 x daily - 7 x weekly - 2 sets - 10 reps - 5 seconds hold  - LE neural flossing  - 1 x daily - 7 x weekly - 3 sets - 10 reps  - Standing Quad Set  - 3 x daily - 7 x weekly - 3 sets - 10 reps - 5 seconds hold - Standing Hip Abduction with Counter Support  - 2 x daily - 7 x weekly - 1 sets - 8 reps - 5 seconds hold  - Seated Flexion Stretch  - 3 x daily - 7 x weekly - 3 sets - 8 reps - 10 seconds hold  - Mini Squat with Counter Support  - 1 x daily - 7 x weekly - 2 sets - 7 reps ASSESSMENT:  CLINICAL IMPRESSION: Pt demonstrates improved R hamstrings and quadriceps strength, overall improving R knee flexion and extension ROM. Pt also currently able to ambulate at least 20 ft with SPC min A to CGA today with improved steadiness observed. Challenges to progress include knee stiffness before  and after procedure as well as pain. Pt tolerated session well without aggravation of symptoms. Pt will benefit from continued skilled physical therapy services to improve R knee ROM, R LE strength, function, and ability to ambulate with less difficulty.     OBJECTIVE IMPAIRMENTS: Abnormal gait, decreased balance, difficulty walking, decreased ROM, decreased strength, improper body mechanics, postural dysfunction, and pain.   ACTIVITY LIMITATIONS: carrying, lifting, bending, standing, squatting, stairs, transfers, bed mobility, dressing, and locomotion level  PARTICIPATION LIMITATIONS:   PERSONAL FACTORS: Age, Fitness, Past/current experiences, Time since onset of injury/illness/exacerbation, and 3+ comorbidities: arthritis, hx of DVT 2013, dyspnea, HTN, PVD, PE 2013 are also affecting patient's functional outcome.   REHAB POTENTIAL: Fair    CLINICAL DECISION MAKING: Stable/uncomplicated  EVALUATION COMPLEXITY: Low   GOALS: Goals reviewed with patient?  Yes  SHORT TERM GOALS: Target date: 01/06/2024  Pt will be independent with her initial HEP to improve R knee ROM, strength, and ability to ambulate and perform transfers with less difficulty.  Baseline: Pt has started her initial HEP (12/28/2023); No questions (02/08/2024) Goal status: MET  LONG TERM GOALS: Target date: 03/30/2024  Pt will be able to ambulate at least 500 ft without AD and no LOB to promote mobility.  Baseline: Pt currently ambulating with rw. (12/28/2023);  Gait with SPC on L 20 ft, mod A. unsteady secondary to weakness; currently ambulates with rw (02/03/2024); at least 20 ft with [facility] on L side, Min A to CGA, improved steadiness (02/22/2024) Goal status: PROGRESSING  2.  Pt will improve R knee flexion AROM to 115 degrees or more and R knee extension AROM to -5 degrees or more to promote ability to ambulate, perform standing tasks, perform transfers and negotiate stairs with less difficulty.  Baseline:  Active  Right eval R 02/03/2024 R 02/22/2024  Knee flexion 56 (60 AAROM, stiff end feel) 75 degrees 71  Knee extension -24 (-15 AAROM), stiff end feel -15 degrees -13   Goal status: PROGRESSING  3.  Pt will improve her R knee flexion and extension strength by at least 1/2 MMT grade to promote ability to ambulate, perform standing tasks with less difficulty.  Baseline:  MMT Right eval R 02/03/2024 R 02/22/2024  Knee flexion 3+ 4- 4  Knee extension 4 5 5    Goal status: MET  4.  Pt will improve her LEFS score by at least 20 points as a demonstration of improved function.  Baseline: 7/80 (12/28/2023); 22/80 (02/08/2024) Goal status: PROGRESSING  PLAN:  PT FREQUENCY: 1-2x/week  PT DURATION: other: 5 weeks  PLANNED INTERVENTIONS: 97110-Therapeutic exercises, 97530- Therapeutic activity, 97112- Neuromuscular re-education, 97535- Self Care, 02859- Manual therapy, [phone]- Gait training, [phone]- Electrical stimulation (unattended), [phone]- Ionotophoresis 4mg /ml  Dexamethasone , Patient/Family education, Balance training, Joint mobilization, and Scar mobilization.  PLAN FOR NEXT SESSION: knee ROM, strengthening, gait, function, manual techniques, modalities PRN    Karrissa Parchment, PT, DPT 02/22/2024, 12:21 PM  12:21 PM, 02/22/24  Physical Therapist - [place] [phone] (Office)

## 2024-02-24 ENCOUNTER — Encounter

## 2024-02-27 ENCOUNTER — Ambulatory Visit

## 2024-02-27 DIAGNOSIS — G8929 Other chronic pain: Secondary | ICD-10-CM

## 2024-02-27 DIAGNOSIS — M25661 Stiffness of right knee, not elsewhere classified: Secondary | ICD-10-CM

## 2024-02-27 DIAGNOSIS — R262 Difficulty in walking, not elsewhere classified: Secondary | ICD-10-CM

## 2024-02-27 NOTE — Therapy (Signed)
 OUTPATIENT PHYSICAL THERAPY  TREATMENT   Patient Name: Leah Roberts MRN: [id number] DOB:[birth year], 73 y.o., female Today's Date: 02/27/2024  END OF SESSION:  PT End of Session - 02/27/24 1348     Visit Number 14    Number of Visits 27    Date for Recertification  03/30/24    Authorization Type Self pay    Progress Note Due on Visit 20    PT Start Time 1348    PT Stop Time 1427    PT Time Calculation (min) 39 min    Activity Tolerance Patient tolerated treatment well;Patient limited by pain    Behavior During Therapy [facility] for tasks assessed/performed                    Past Medical History:  Diagnosis Date   Arthritis    Complication of anesthesia    Hard to wake up   DVT (deep venous thrombosis) (HCC) 2013   Dyspnea    due to knee pain   GERD (gastroesophageal reflux disease)    History of blood transfusion    History of hypertension    Hypertension    Peripheral vascular disease    DVT   Pre-diabetes    Pulmonary embolism (HCC) 2013   Past Surgical History:  Procedure Laterality Date   APPENDECTOMY     PULMONARY THROMBECTOMY Bilateral 01/01/2022   Procedure: PULMONARY THROMBECTOMY;  Surgeon: Jama Cordella MATSU, MD;  Location: [facility] INVASIVE CV LAB;  Service: Cardiovascular;  Laterality: Bilateral;   TONSILLECTOMY AND ADENOIDECTOMY     TOTAL KNEE ARTHROPLASTY Left 12/09/2021   Procedure: LEFT TOTAL KNEE ARTHROPLASTY;  Surgeon: Harden Jerona GAILS, MD;  Location: [facility] OR;  Service: Orthopedics;  Laterality: Left;   TOTAL KNEE ARTHROPLASTY Right 11/30/2023   Procedure: ARTHROPLASTY, KNEE, TOTAL;  Surgeon: Harden Jerona GAILS, MD;  Location: [facility] OR;  Service: Orthopedics;  Laterality: Right;   Patient Active Problem List   Diagnosis Date Noted   S/P total knee replacement, right 12/04/2023   Total knee replacement status, right 11/30/2023   Unilateral primary osteoarthritis, right knee 11/30/2023   Anticoagulated 11/23/2023   Burn 06/27/2023   Hemorrhoid  03/23/2023   RVF (right ventricular failure) (HCC) 01/12/2023   Atopic dermatitis 01/12/2023   Shortness of breath 08/29/2022   Encounter for screening mammogram for malignant neoplasm of breast 08/29/2022   Right groin pain 01/13/2022   Bilateral pulmonary embolism with right heart strain RV/LV ratio 1:5 (HCC) 12/31/2021   Acute respiratory failure with hypoxia (HCC) 12/31/2021   Total knee replacement status, left 12/09/21 12/09/2021   Unilateral primary osteoarthritis, left knee    Shoulder disorder 09/03/2021   Arthritis of knee 09/03/2021   Lumbar radiculopathy 09/03/2021   Abnormal MRI, knee 09/03/2021   Chronic pain of right knee 09/03/2021   Bronchitis 03/25/2021   Anemia 01/09/2020   HTN (hypertension) 12/31/2019   Chest pain 12/31/2019   DOE (dyspnea on exertion) 12/31/2019   Acute coronary syndrome (HCC) 12/31/2019   Unstable angina (HCC) 12/31/2019   Atherosclerosis 08/23/2018   Screening for tuberculosis 07/12/2018   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   Arthralgia 07/12/2018   Solitary pulmonary nodule 07/12/2018   Neck pain 02/15/2018   Dizziness 02/15/2018   Elevated TSH 02/15/2018   Screening for HIV (human immunodeficiency virus) 10/05/2017   Neuropathy 10/05/2017   Screening for colon cancer 10/05/2017   Hyperlipidemia 08/04/2016   Prediabetes 08/04/2016   Back pain 08/04/2016   Bilateral knee pain 06/18/2016   History  of DVT of lower extremity 04/01/2014   History of DVT and PE (deep vein thrombosis) 05/04/2011    PCP: Dineen Rollene MATSU, FNP   REFERRING PROVIDER: Harden Jerona GAILS, MD  REFERRING DIAG: Diagnosis [phone] (ICD-10-CM) - S/P total knee replacement, right  Rationale for Evaluation and Treatment: Rehabilitation  THERAPY DIAG:  Chronic pain of right knee  Difficulty in walking, not elsewhere classified  Stiffness of right knee, not elsewhere classified  ONSET DATE: S/P R TKA on 11/30/2023  SUBJECTIVE:                                                                                                                                                                                            SUBJECTIVE STATEMENT: 7/10 R knee pain at start of session. R leg and the bottom of her foot in numb and tingly.   Moving to [place]  this December 2025 for good to help her son with his 4 kids. The bedrooms are on the 2nd floor.     PERTINENT HISTORY:  S/P R TKA on 11/30/2023. Has only had to 2 PT home health follow up treatments. Sister brought her to PT today. Has been icing her knee and propping it up.  PAIN:  Are you having pain? 9/10 this morning, running late early AM and forgot her pain meds and is still stiff from overnight sleep.   PRECAUTIONS: Knee  RED FLAGS: Bowel or bladder incontinence: No and Cauda equina syndrome: No   WEIGHT BEARING RESTRICTIONS: WBAT  FALLS:  Has patient fallen in last 6 months? No  LIVING ENVIRONMENT: Lives with: sister Lives in: House/apartment Stairs: No Has following equipment at home: Environmental Consultant - 2 wheeled  OCCUPATION: Retired  PLOF: Mod I with gait using SPC  PATIENT GOALS: be able to stand up and walk.   NEXT MD VISIT: 01/09/2024  OBJECTIVE:  Note: Objective measures were completed at Evaluation unless otherwise noted.   PATIENT SURVEYS   Extreme difficulty/unable (0), Quite a bit of difficulty (1), Moderate difficulty (2), Little difficulty (3), No difficulty (4) Survey date:  02/08/2024  Any of your usual work, housework or school activities 2  2. Usual hobbies, recreational or sporting activities 0  3. Getting into/out of the bath 3  4. Walking between rooms 0  5. Putting on socks/shoes 2  6. Squatting  0  7. Lifting an object, like a bag of groceries from the floor 2  8. Performing light activities around your home 2  9. Performing heavy activities around your home 0  10. Getting into/out of a car 3  11. Walking 2 blocks 0  12. Walking  1 mile 0  13. Going up/down 10 stairs (1  flight) 0  14. Standing for 1 hour 2  15.  sitting for 1 hour 4  16. Running on even ground 0  17. Running on uneven ground 0  18. Making sharp turns while running fast 0  19. Hopping  0  20. Rolling over in bed 2  Score total:  22/80     TREATMENT 02/27/24          Finished 13 weeks post op on 02/29/2024    Therapeutic activity  Pt observed to enter using NBQC on L side today instead of rw  Nustep seat 10, arms 10 level 1 for 5 minutes for joint nutrition and decreased R knee joint stiffness.    Standing knee flexion stretch onto first stair step with B UE assist 10x5 seconds for 3 sets    Standing R gastroc stretch 10x5 seconds with B UE assist   Forward step up onto first regular step with B UE assist   R 10x3  Standing knee flexion with B UE assist 10x5 seconds   Ascending and descending 4 regular steps with B UE assist with railing with up with the R LE and down with the R LE 3x to improve ability to negotiate stairs when she moves to [place]  in December 2025.  CGA    Improved technique, movement at target joints, use of target muscles after mod verbal, visual, tactile cues.       PATIENT EDUCATION:  Education details: there-ex, HEP, POC Person educated: Patient Education method: Explanation, Demonstration, Tactile cues, Verbal cues, and Handouts Education comprehension: verbalized understanding and returned demonstration  HOME EXERCISE PROGRAM: Access Code: [email or web address] URL: [email or web address] Date: 12/28/2023 Prepared by: Emil Glassman  Exercises - Supine Quad Set  - 3 x daily - 7 x weekly - 3 sets - 10 reps - 5 seconds hold - Seated Knee Flexion AAROM  - 3 x daily - 7 x weekly - 3 sets - 10 reps - 5 seconds hold - Seated Heel Toe Raises  - 3 x daily - 7 x weekly - 3 sets - 10 reps - Seated Long Arc Quad  - 2 x daily - 7 x weekly - 2 sets - 10 reps - 5 seconds hold  - LE neural flossing  - 1 x daily - 7 x weekly - 3 sets - 10 reps  -  Standing Quad Set  - 3 x daily - 7 x weekly - 3 sets - 10 reps - 5 seconds hold - Standing Hip Abduction with Counter Support  - 2 x daily - 7 x weekly - 1 sets - 8 reps - 5 seconds hold  - Seated Flexion Stretch  - 3 x daily - 7 x weekly - 3 sets - 8 reps - 10 seconds hold  - Mini Squat with Counter Support  - 1 x daily - 7 x weekly - 2 sets - 7 reps ASSESSMENT:  CLINICAL IMPRESSION: Continued working on improving R knee flexion and extension ROM, as well as functional LE strength to promote ability to ambulate as well as negotiate stairs with less difficulty. Challenges to progress include knee stiffness before and after procedure as well as pain. Pt tolerated session well without aggravation of symptoms. Pt will benefit from continued skilled physical therapy services to improve R knee ROM, R LE strength, function, and ability to ambulate with less difficulty.     OBJECTIVE IMPAIRMENTS: Abnormal gait, decreased balance,  difficulty walking, decreased ROM, decreased strength, improper body mechanics, postural dysfunction, and pain.   ACTIVITY LIMITATIONS: carrying, lifting, bending, standing, squatting, stairs, transfers, bed mobility, dressing, and locomotion level  PARTICIPATION LIMITATIONS:   PERSONAL FACTORS: Age, Fitness, Past/current experiences, Time since onset of injury/illness/exacerbation, and 3+ comorbidities: arthritis, hx of DVT 2013, dyspnea, HTN, PVD, PE 2013 are also affecting patient's functional outcome.   REHAB POTENTIAL: Fair    CLINICAL DECISION MAKING: Stable/uncomplicated  EVALUATION COMPLEXITY: Low   GOALS: Goals reviewed with patient? Yes  SHORT TERM GOALS: Target date: 01/06/2024  Pt will be independent with her initial HEP to improve R knee ROM, strength, and ability to ambulate and perform transfers with less difficulty.  Baseline: Pt has started her initial HEP (12/28/2023); No questions (02/08/2024) Goal status: MET  LONG TERM GOALS: Target date:  03/30/2024  Pt will be able to ambulate at least 500 ft without AD and no LOB to promote mobility.  Baseline: Pt currently ambulating with rw. (12/28/2023);  Gait with SPC on L 20 ft, mod A. unsteady secondary to weakness; currently ambulates with rw (02/03/2024); at least 20 ft with [facility] on L side, Min A to CGA, improved steadiness (02/22/2024) Goal status: PROGRESSING  2.  Pt will improve R knee flexion AROM to 115 degrees or more and R knee extension AROM to -5 degrees or more to promote ability to ambulate, perform standing tasks, perform transfers and negotiate stairs with less difficulty.  Baseline:  Active  Right eval R 02/03/2024 R 02/22/2024  Knee flexion 56 (60 AAROM, stiff end feel) 75 degrees 71  Knee extension -24 (-15 AAROM), stiff end feel -15 degrees -13   Goal status: PROGRESSING  3.  Pt will improve her R knee flexion and extension strength by at least 1/2 MMT grade to promote ability to ambulate, perform standing tasks with less difficulty.  Baseline:  MMT Right eval R 02/03/2024 R 02/22/2024  Knee flexion 3+ 4- 4  Knee extension 4 5 5    Goal status: MET  4.  Pt will improve her LEFS score by at least 20 points as a demonstration of improved function.  Baseline: 7/80 (12/28/2023); 22/80 (02/08/2024) Goal status: PROGRESSING  PLAN:  PT FREQUENCY: 1-2x/week  PT DURATION: other: 5 weeks  PLANNED INTERVENTIONS: 97110-Therapeutic exercises, 97530- Therapeutic activity, 97112- Neuromuscular re-education, 97535- Self Care, 02859- Manual therapy, [phone]- Gait training, [phone]- Electrical stimulation (unattended), [phone]- Ionotophoresis 4mg /ml Dexamethasone , Patient/Family education, Balance training, Joint mobilization, and Scar mobilization.  PLAN FOR NEXT SESSION: knee ROM, strengthening, gait, function, manual techniques, modalities PRN    Taseen Marasigan, PT, DPT 02/27/2024, 2:30 PM  2:30 PM, 02/27/24  Physical Therapist - [place] [phone] (Office)

## 2024-02-28 ENCOUNTER — Ambulatory Visit (INDEPENDENT_AMBULATORY_CARE_PROVIDER_SITE_OTHER): Admitting: Family

## 2024-02-28 ENCOUNTER — Telehealth: Payer: Self-pay | Admitting: Orthopedic Surgery

## 2024-02-28 ENCOUNTER — Other Ambulatory Visit: Payer: Self-pay | Admitting: Orthopedic Surgery

## 2024-02-28 ENCOUNTER — Other Ambulatory Visit: Payer: Self-pay

## 2024-02-28 ENCOUNTER — Encounter: Payer: Self-pay | Admitting: Family

## 2024-02-28 VITALS — BP 120/70 | HR 77 | Temp 97.9°F | Ht 65.0 in | Wt 230.6 lb

## 2024-02-28 DIAGNOSIS — M25541 Pain in joints of right hand: Secondary | ICD-10-CM

## 2024-02-28 DIAGNOSIS — Z780 Asymptomatic menopausal state: Secondary | ICD-10-CM

## 2024-02-28 DIAGNOSIS — Z23 Encounter for immunization: Secondary | ICD-10-CM

## 2024-02-28 DIAGNOSIS — Z96651 Presence of right artificial knee joint: Secondary | ICD-10-CM

## 2024-02-28 DIAGNOSIS — R739 Hyperglycemia, unspecified: Secondary | ICD-10-CM

## 2024-02-28 DIAGNOSIS — G629 Polyneuropathy, unspecified: Secondary | ICD-10-CM

## 2024-02-28 DIAGNOSIS — Z1211 Encounter for screening for malignant neoplasm of colon: Secondary | ICD-10-CM

## 2024-02-28 DIAGNOSIS — Z0000 Encounter for general adult medical examination without abnormal findings: Secondary | ICD-10-CM

## 2024-02-28 DIAGNOSIS — E785 Hyperlipidemia, unspecified: Secondary | ICD-10-CM

## 2024-02-28 DIAGNOSIS — Z1231 Encounter for screening mammogram for malignant neoplasm of breast: Secondary | ICD-10-CM

## 2024-02-28 DIAGNOSIS — R7303 Prediabetes: Secondary | ICD-10-CM

## 2024-02-28 DIAGNOSIS — Z124 Encounter for screening for malignant neoplasm of cervix: Secondary | ICD-10-CM

## 2024-02-28 LAB — COMPREHENSIVE METABOLIC PANEL WITH GFR
ALT: 7 U/L (ref 0–35)
AST: 14 U/L (ref 0–37)
Albumin: 4.2 g/dL (ref 3.5–5.2)
Alkaline Phosphatase: 60 U/L (ref 39–117)
BUN: 13 mg/dL (ref 6–23)
CO2: 27 meq/L (ref 19–32)
Calcium: 9.4 mg/dL (ref 8.4–10.5)
Chloride: 104 meq/L (ref 96–112)
Creatinine, Ser: 0.83 mg/dL (ref 0.40–1.20)
GFR: 70.99 mL/min (ref >60.00)
Glucose, Bld: 91 mg/dL (ref 70–99)
Potassium: 3.9 meq/L (ref 3.5–5.1)
Sodium: 139 meq/L (ref 135–145)
Total Bilirubin: 0.3 mg/dL (ref 0.2–1.2)
Total Protein: 7.3 g/dL (ref 6.0–8.3)

## 2024-02-28 LAB — POCT GLYCOSYLATED HEMOGLOBIN (HGB A1C): HbA1c POC (<> result, manual entry): 5.8 % (ref 4.0–5.6)

## 2024-02-28 LAB — VITAMIN D 25 HYDROXY (VIT D DEFICIENCY, FRACTURES): VITD: 17.66 ng/mL — ABNORMAL LOW (ref 30.00–100.00)

## 2024-02-28 MED ORDER — OXYCODONE-ACETAMINOPHEN 10-325 MG PO TABS
1.0000 | ORAL_TABLET | Freq: Four times a day (QID) | ORAL | 0 refills | Status: DC | PRN
  Filled 2024-02-28: qty 30, 8d supply, fill #0

## 2024-02-28 MED ORDER — METHOCARBAMOL 500 MG PO TABS
500.0000 mg | ORAL_TABLET | Freq: Four times a day (QID) | ORAL | 1 refills | Status: DC
  Filled 2024-02-28: qty 40, 10d supply, fill #0
  Filled 2024-03-15: qty 40, 10d supply, fill #1

## 2024-02-28 MED ORDER — GABAPENTIN 100 MG PO CAPS
100.0000 mg | ORAL_CAPSULE | Freq: Three times a day (TID) | ORAL | 3 refills | Status: AC
  Filled 2024-02-28: qty 90, 30d supply, fill #0

## 2024-02-28 NOTE — Telephone Encounter (Signed)
 Patient called. She would like a refill on her pain medication and muscle relaxer.

## 2024-02-28 NOTE — Progress Notes (Signed)
 Assessment & Plan:  Annual physical exam Assessment & Plan: She will schedule mammogram and DEXA. Discussed lack of pap smear in 60's; we attempted pap smear today however patient is unable to ascend the exam table or use stirrups; advised that we can try again at anytime and she will let me know. Discussed ACOG guidelines that cervical cancer screening stops at age 71.     S/P total knee replacement, right  Elevated blood sugar -     POCT glycosylated hemoglobin (Hb A1C)  Prediabetes -     Comprehensive metabolic panel with GFR  Screening for colon cancer -     Cologuard  Hypocalcemia -     VITAMIN D  25 Hydroxy (Vit-D Deficiency, Fractures)  Screening for cervical cancer  Encounter for screening mammogram for malignant neoplasm of breast Assessment & Plan: She will schedule mammogram and DEXA. Discussed lack of pap smear in 60's; we attempted pap smear today however patient is unable to ascend the exam table or use stirrups; advised that we can try again at anytime and she will let me know. Discussed ACOG guidelines that cervical cancer screening stops at age 49.    Orders: -     3D Screening Mammogram, Left and Right; Future  Postmenopausal estrogen deficiency -     HM DEXA SCAN; Future -     Comprehensive metabolic panel with GFR -     VITAMIN D  25 Hydroxy (Vit-D Deficiency, Fractures)  Neuropathy -     Gabapentin ; Take 1 capsule (100 mg total) by mouth 3 (three) times daily.  Dispense: 90 capsule; Refill: 3  Arthralgia of right hand Assessment & Plan: Chronic, following with Dr Harden Discussed trial stop crestor  5mg  to ensure not contributory.  Start and titrate gabapentin  100mg  BID. Close follow up.    Hyperlipidemia, unspecified hyperlipidemia type Assessment & Plan: The 10-year ASCVD risk score (Breck Maryland DK, et al., 2019) is: 19.2% Discussed ASCVD risk. Trial stop crestor  d/t possible myalgia.  Close follow up.      Return precautions given.   Risks,  benefits, and alternatives of the medications and treatment plan prescribed today were discussed, and patient expressed understanding.   Education regarding symptom management and diagnosis given to patient on AVS either electronically or printed.  Return in about 1 month (around 03/30/2024).  Leah Northern, FNP  Subjective:    Patient ID: Leah Roberts, female    DOB: [birth year], 73 y.o.   MRN: [id number]  CC: Leah Roberts is a 73 y.o. female who presents today for physical exam.    HPI: HPI Discussed the use of AI scribe software for clinical note transcription with the patient, who gave verbal consent to proceed.  History of Present Illness   Leah Roberts is a 73 year old female who presents for an annual physical exam.   She has not had a Pap smear in over ten years and has no history of abnormal Pap smears or gynecologic cancer. She would like a pap smear today.  She has a history of prediabetes, with her most recent hemoglobin A1c at 5.8%, down from 6.5% a few months ago. She is currently on Crestor  5 mg for cholesterol management. She wants to stop taking Crestor  due to muscle aches in thighs.    She experiences joint pain, particularly in her knees, affecting her mobility.  She continue to have numbness in her feet. Denies LBP.   She is currently taking oxycodone  and tramadol  for pain management  with Dr Harden  Colorectal Cancer Screening: due cologuard; no family h/o colon cancer  Breast Cancer Screening: Mammogram due Cervical Cancer Screening: No record of pap smear; due No h/o pap smear. No h/o GYN , radiation to the pelvis.    Bone Health screening/DEXA for 65+: UTD  Lung Cancer Screening: Doesn't have 20 year pack year history and age > 69 years yo 23 years  No family history of AAA.        Tetanus - UTD        Pneumococcal - Candidate for, consents   Exercise: limited due to joint pain. Alcohol use:  occassional Smoking/tobacco  use: Nonsmoker.    Health Maintenance  Topic Date Due   Yearly kidney health urinalysis for diabetes  Never done   Zoster (Shingles) Vaccine (1 of 2) Never done   DEXA scan (bone density measurement)  Never done   Breast Cancer Screening  06/24/2022   Cologuard (Stool DNA test)  07/19/2023   Flu Shot  12/02/2023   COVID-19 Vaccine (4 - 2025-26 season) 03/15/2024*   DTaP/Tdap/Td vaccine (2 - Tdap) 10/30/2024   Yearly kidney function blood test for diabetes  11/23/2024   Hepatitis C Screening  Completed   Meningitis B Vaccine  Aged Out   Pneumococcal Vaccine for age over 5  Discontinued  *Topic was postponed. The date shown is not the original due date.    ALLERGIES: Sulfa antibiotics  Current Outpatient Medications on File Prior to Visit  Medication Sig Dispense Refill   acetaminophen  (TYLENOL ) 500 MG tablet Take 1,000 mg by mouth 2 (two) times daily as needed for moderate pain (pain score 4-6).     apixaban  (ELIQUIS ) 5 MG TABS tablet Take 1 tablet (5 mg total) by mouth 2 (two) times daily. 180 tablet 3   ascorbic acid (VITAMIN C) 500 MG tablet Take 500 mg by mouth daily as needed (immune support).     calcium  elemental as carbonate (TUMS ULTRA 1000) 400 MG chewable tablet Chew 2,000 mg by mouth 2 (two) times daily as needed for heartburn.     loratadine (CLARITIN) 10 MG tablet Take 10 mg by mouth daily as needed for allergies.     Magnesium  Citrate 100 MG CAPS Take 100 mg by mouth 3 (three) times a week.     melatonin 5 MG TABS Take 5 mg by mouth at bedtime as needed (sleep).     rosuvastatin  (CRESTOR ) 5 MG tablet Take 1 tablet (5 mg total) by mouth at bedtime. 90 tablet 3   traMADol  (ULTRAM ) 50 MG tablet Take 50 mg by mouth 2 (two) times daily as needed (pain). (Patient not taking: Reported on 02/28/2024)     No current facility-administered medications on file prior to visit.    Review of Systems  Constitutional:  Negative for chills and fever.  Respiratory:  Negative for  cough.   Cardiovascular:  Negative for chest pain and palpitations.  Gastrointestinal:  Negative for nausea and vomiting.  Neurological:  Positive for numbness (BL feet).      Objective:    BP 120/70   Pulse 77   Temp 97.9 F (36.6 C) (Oral)   Ht 5' 5 (1.651 m)   Wt 230 lb 9.6 oz (104.6 kg)   SpO2 97%   BMI 38.37 kg/m   BP Readings from Last 3 Encounters:  02/28/24 120/70  12/05/23 117/64  11/24/23 130/83   Wt Readings from Last 3 Encounters:  02/28/24 230 lb 9.6 oz (104.6 kg)  11/30/23 233 lb (  105.7 kg)  11/24/23 233 lb (105.7 kg)    Physical Exam Vitals reviewed.  Constitutional:      Appearance: Normal appearance. She is well-developed.  Eyes:     Conjunctiva/sclera: Conjunctivae normal.  Neck:     Thyroid: No thyroid mass or thyromegaly.  Cardiovascular:     Rate and Rhythm: Normal rate and regular rhythm.     Pulses: Normal pulses.     Heart sounds: Normal heart sounds.  Pulmonary:     Effort: Pulmonary effort is normal.     Breath sounds: Normal breath sounds. No wheezing, rhonchi or rales.  Chest:  Breasts:    Breasts are symmetrical.     Right: No inverted nipple, mass, nipple discharge, skin change or tenderness.     Left: No inverted nipple, mass, nipple discharge, skin change or tenderness.  Abdominal:     General: Bowel sounds are normal. There is no distension.     Palpations: Abdomen is soft. Abdomen is not rigid. There is no fluid wave or mass.     Tenderness: There is no abdominal tenderness. There is no guarding or rebound.  Lymphadenopathy:     Head:     Right side of head: No submental, submandibular, tonsillar, preauricular, posterior auricular or occipital adenopathy.     Left side of head: No submental, submandibular, tonsillar, preauricular, posterior auricular or occipital adenopathy.     Cervical: No cervical adenopathy.     Right cervical: No superficial, deep or posterior cervical adenopathy.    Left cervical: No superficial, deep  or posterior cervical adenopathy.  Skin:    General: Skin is warm and dry.  Neurological:     Mental Status: She is alert.  Psychiatric:        Speech: Speech normal.        Behavior: Behavior normal.        Thought Content: Thought content normal.

## 2024-02-28 NOTE — Telephone Encounter (Signed)
 Pt is s/p a right total knee replacement on 11/30/2023. Asking for refill on Oxycodone  last refill was 02/08/2024 #30 please advise

## 2024-02-28 NOTE — Assessment & Plan Note (Signed)
 Chronic, following with Dr Harden Discussed trial stop crestor  5mg  to ensure not contributory.  Start and titrate gabapentin  100mg  BID. Close follow up.

## 2024-02-28 NOTE — Assessment & Plan Note (Signed)
 The 10-year ASCVD risk score (Charell Faulk DK, et al., 2019) is: 19.2% Discussed ASCVD risk. Trial stop crestor  d/t possible myalgia.  Close follow up.

## 2024-02-28 NOTE — Patient Instructions (Addendum)
 Please call  and schedule your 3D mammogram and /or bone density scan as we discussed.   [facility]  ( new location in 2023)  [address], [place], [place] [address]  [place], [place]  [phone]   TRIAL stop crestor  for at least 2 weeks; see if joint pain improves.   Trial of gabapentin  for peripheral numbness in feet.   Health Maintenance for Postmenopausal Women Menopause is a normal process in which your ability to get pregnant comes to an end. This process happens slowly over many months or years, usually between the ages of 40 and 81. Menopause is complete when you have missed your menstrual period for 12 months. It is important to talk with your health care provider about some of the most common conditions that affect women after menopause (postmenopausal women). These include heart disease, cancer, and bone loss (osteoporosis). Adopting a healthy lifestyle and getting preventive care can help to promote your health and wellness. The actions you take can also lower your chances of developing some of these common conditions. What are the signs and symptoms of menopause? During menopause, you may have the following symptoms: Hot flashes. These can be moderate or severe. Night sweats. Decrease in sex drive. Mood swings. Headaches. Tiredness (fatigue). Irritability. Memory problems. Problems falling asleep or staying asleep. Talk with your health care provider about treatment options for your symptoms. Do I need hormone replacement therapy? Hormone replacement therapy is effective in treating symptoms that are caused by menopause, such as hot flashes and night sweats. Hormone replacement carries certain risks, especially as you become older. If you are thinking about using estrogen or estrogen with progestin, discuss the benefits and risks with your health care provider. How can I reduce my risk for heart disease and stroke? The risk of heart disease,  heart attack, and stroke increases as you age. One of the causes may be a change in the body's hormones during menopause. This can affect how your body uses dietary fats, triglycerides, and cholesterol. Heart attack and stroke are medical emergencies. There are many things that you can do to help prevent heart disease and stroke. Watch your blood pressure High blood pressure causes heart disease and increases the risk of stroke. This is more likely to develop in people who have high blood pressure readings or are overweight. Have your blood pressure checked: Every 3-5 years if you are 32-62 years of age. Every year if you are 51 years old or older. Eat a healthy diet  Eat a diet that includes plenty of vegetables, fruits, low-fat dairy products, and lean protein. Do not eat a lot of foods that are high in solid fats, added sugars, or sodium. Get regular exercise Get regular exercise. This is one of the most important things you can do for your health. Most adults should: Try to exercise for at least 150 minutes each week. The exercise should increase your heart rate and make you sweat (moderate-intensity exercise). Try to do strengthening exercises at least twice each week. Do these in addition to the moderate-intensity exercise. Spend less time sitting. Even light physical activity can be beneficial. Other tips Work with your health care provider to achieve or maintain a healthy weight. Do not use any products that contain nicotine or tobacco. These products include cigarettes, chewing tobacco, and vaping devices, such as e-cigarettes. If you need help quitting, ask your health care provider. Know your numbers. Ask your health care provider to check your cholesterol  and your blood sugar (glucose). Continue to have your blood tested as directed by your health care provider. Do I need screening for cancer? Depending on your health history and family history, you may need to have cancer screenings  at different stages of your life. This may include screening for: Breast cancer. Cervical cancer. Lung cancer. Colorectal cancer. What is my risk for osteoporosis? After menopause, you may be at increased risk for osteoporosis. Osteoporosis is a condition in which bone destruction happens more quickly than new bone creation. To help prevent osteoporosis or the bone fractures that can happen because of osteoporosis, you may take the following actions: If you are 50-46 years old, get at least 1,000 mg of calcium  and at least 600 international units (IU) of vitamin D  per day. If you are older than age 43 but younger than age 29, get at least 1,200 mg of calcium  and at least 600 international units (IU) of vitamin D  per day. If you are older than age 71, get at least 1,200 mg of calcium  and at least 800 international units (IU) of vitamin D  per day. Smoking and drinking excessive alcohol increase the risk of osteoporosis. Eat foods that are rich in calcium  and vitamin D , and do weight-bearing exercises several times each week as directed by your health care provider. How does menopause affect my mental health? Depression may occur at any age, but it is more common as you become older. Common symptoms of depression include: Feeling depressed. Changes in sleep patterns. Changes in appetite or eating patterns. Feeling an overall lack of motivation or enjoyment of activities that you previously enjoyed. Frequent crying spells. Talk with your health care provider if you think that you are experiencing any of these symptoms. General instructions See your health care provider for regular wellness exams and vaccines. This may include: Scheduling regular health, dental, and eye exams. Getting and maintaining your vaccines. These include: Influenza vaccine. Get this vaccine each year before the flu season begins. Pneumonia vaccine. Shingles vaccine. Tetanus, diphtheria, and pertussis (Tdap) booster  vaccine. Your health care provider may also recommend other immunizations. Tell your health care provider if you have ever been abused or do not feel safe at home. Summary Menopause is a normal process in which your ability to get pregnant comes to an end. This condition causes hot flashes, night sweats, decreased interest in sex, mood swings, headaches, or lack of sleep. Treatment for this condition may include hormone replacement therapy. Take actions to keep yourself healthy, including exercising regularly, eating a healthy diet, watching your weight, and checking your blood pressure and blood sugar levels. Get screened for cancer and depression. Make sure that you are up to date with all your vaccines. This information is not intended to replace advice given to you by your health care provider. Make sure you discuss any questions you have with your health care provider. Document Revised: 09/08/2020 Document Reviewed: 09/08/2020 Elsevier Patient Education  2024 [employer].

## 2024-02-28 NOTE — Assessment & Plan Note (Addendum)
 She will schedule mammogram and DEXA. Discussed lack of pap smear in 60's; we attempted pap smear today however patient is unable to ascend the exam table or use stirrups; advised that we can try again at anytime and she will let me know. Discussed ACOG guidelines that cervical cancer screening stops at age 71.

## 2024-02-28 NOTE — Addendum Note (Signed)
 Addended by: Lauri Till on: 02/28/2024 12:39 PM   Modules accepted: Orders

## 2024-02-29 ENCOUNTER — Ambulatory Visit

## 2024-02-29 ENCOUNTER — Other Ambulatory Visit: Payer: Self-pay

## 2024-02-29 ENCOUNTER — Ambulatory Visit: Payer: Self-pay | Admitting: Family

## 2024-02-29 DIAGNOSIS — R262 Difficulty in walking, not elsewhere classified: Secondary | ICD-10-CM

## 2024-02-29 DIAGNOSIS — M25661 Stiffness of right knee, not elsewhere classified: Secondary | ICD-10-CM

## 2024-02-29 DIAGNOSIS — G8929 Other chronic pain: Secondary | ICD-10-CM

## 2024-02-29 NOTE — Therapy (Signed)
 OUTPATIENT PHYSICAL THERAPY  TREATMENT   Patient Name: Leah Roberts MRN: [id number] DOB:1950/11/05, 73 y.o., female Today's Date: 02/29/2024  END OF SESSION:  PT End of Session - 02/29/24 1306     Number of Visits 27    Date for Recertification  03/30/24    Authorization Type Self pay    Progress Note Due on Visit 20    PT Start Time 1306    PT Stop Time 1345    PT Time Calculation (min) 39 min    Activity Tolerance Patient tolerated treatment well;Patient limited by pain    Behavior During Therapy [facility] for tasks assessed/performed                     Past Medical History:  Diagnosis Date   Arthritis    Complication of anesthesia    Hard to wake up   DVT (deep venous thrombosis) (HCC) 2013   Dyspnea    due to knee pain   GERD (gastroesophageal reflux disease)    History of blood transfusion    History of hypertension    Hypertension    Peripheral vascular disease    DVT   Pre-diabetes    Pulmonary embolism (HCC) 2013   Past Surgical History:  Procedure Laterality Date   APPENDECTOMY     PULMONARY THROMBECTOMY Bilateral 01/01/2022   Procedure: PULMONARY THROMBECTOMY;  Surgeon: Jama Cordella MATSU, MD;  Location: [facility] INV[facility]IVE [facility];  Service: Cardiovascular;  Laterality: Bilateral;   TONSILLECTOMY AND ADENOIDECTOMY     TOTAL KNEE ARTHROPLASTY Left 12/09/2021   Procedure: LEFT TOTAL KNEE ARTHROPLASTY;  Surgeon: Harden Jerona GAILS, MD;  Location: [facility] OR;  Service: Orthopedics;  Laterality: Left;   TOTAL KNEE ARTHROPLASTY Right 11/30/2023   Procedure: ARTHROPLASTY, KNEE, TOTAL;  Surgeon: Harden Jerona GAILS, MD;  Location: [facility] OR;  Service: Orthopedics;  Laterality: Right;   Patient Active Problem List   Diagnosis Date Noted   S/P total knee replacement, right 12/04/2023   Total knee replacement status, right 11/30/2023   Unilateral primary osteoarthritis, right knee 11/30/2023   Anticoagulated 11/23/2023   Burn 06/27/2023   Hemorrhoid 03/23/2023   RVF (right  ventricular failure) (HCC) 01/12/2023   Atopic dermatitis 01/12/2023   Shortness of breath 08/29/2022   Annual physical exam 08/29/2022   Right groin pain 01/13/2022   Bilateral pulmonary embolism with right heart strain RV/LV ratio 1:5 (HCC) 12/31/2021   Acute respiratory failure with hypoxia (HCC) 12/31/2021   Total knee replacement status, left 12/09/21 12/09/2021   Unilateral primary osteoarthritis, left knee    Shoulder disorder 09/03/2021   Arthritis of knee 09/03/2021   Lumbar radiculopathy 09/03/2021   Abnormal MRI, knee 09/03/2021   Chronic pain of right knee 09/03/2021   Bronchitis 03/25/2021   Anemia 01/09/2020   HTN (hypertension) 12/31/2019   Chest pain 12/31/2019   DOE (dyspnea on exertion) 12/31/2019   Acute coronary syndrome (HCC) 12/31/2019   Unstable angina (HCC) 12/31/2019   Atherosclerosis 08/23/2018   Screening for tuberculosis 07/12/2018   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   Arthralgia 07/12/2018   Solitary pulmonary nodule 07/12/2018   Neck pain 02/15/2018   Dizziness 02/15/2018   Elevated TSH 02/15/2018   Screening for HIV (human immunodeficiency virus) 10/05/2017   Neuropathy 10/05/2017   Screening for colon cancer 10/05/2017   Hyperlipidemia 08/04/2016   Prediabetes 08/04/2016   Back pain 08/04/2016   Bilateral knee pain 06/18/2016   History of DVT of lower extremity 04/01/2014   History of DVT  and PE (deep vein thrombosis) 05/04/2011    PCP: Dineen Rollene MATSU, FNP   REFERRING PROVIDER: Harden Jerona GAILS, MD  REFERRING DIAG: Diagnosis [phone] (ICD-10-CM) - S/P total knee replacement, right  Rationale for Evaluation and Treatment: Rehabilitation  THERAPY DIAG:  Chronic pain of right knee  Difficulty in walking, not elsewhere classified  Stiffness of right knee, not elsewhere classified  ONSET DATE: S/P R TKA on 11/30/2023  SUBJECTIVE:                                                                                                                                                                                            SUBJECTIVE STATEMENT: R knee is stiff.     Moving to [place]  this December 2025 for good to help her son with his 4 kids. The bedrooms are on the 2nd floor.     PERTINENT HISTORY:  S/P R TKA on 11/30/2023. Has only had to 2 PT home health follow up treatments. Sister brought her to PT today. Has been icing her knee and propping it up.  PAIN:  Are you having pain? 9/10 this morning, running late early AM and forgot her pain meds and is still stiff from overnight sleep.   PRECAUTIONS: Knee  RED FLAGS: Bowel or bladder incontinence: No and Cauda equina syndrome: No   WEIGHT BEARING RESTRICTIONS: WBAT  FALLS:  Has patient fallen in last 6 months? No  LIVING ENVIRONMENT: Lives with: sister Lives in: House/apartment Stairs: No Has following equipment at home: Environmental Consultant - 2 wheeled  OCCUPATION: Retired  PLOF: Mod I with gait using SPC  PATIENT GOALS: be able to stand up and walk.   NEXT MD VISIT: 01/09/2024  OBJECTIVE:  Note: Objective measures were completed at Evaluation unless otherwise noted.   PATIENT SURVEYS   Extreme difficulty/unable (0), Quite a bit of difficulty (1), Moderate difficulty (2), Little difficulty (3), No difficulty (4) Survey date:  02/08/2024  Any of your usual work, housework or school activities 2  2. Usual hobbies, recreational or sporting activities 0  3. Getting into/out of the bath 3  4. Walking between rooms 0  5. Putting on socks/shoes 2  6. Squatting  0  7. Lifting an object, like a bag of groceries from the floor 2  8. Performing light activities around your home 2  9. Performing heavy activities around your home 0  10. Getting into/out of a car 3  11. Walking 2 blocks 0  12. Walking 1 mile 0  13. Going up/down 10 stairs (1 flight) 0  14. Standing for 1 hour 2  15.  sitting for 1  hour 4  16. Running on even ground 0  17. Running on uneven ground 0  18. Making  sharp turns while running fast 0  19. Hopping  0  20. Rolling over in bed 2  Score total:  22/80     TREATMENT 02/29/24          Finished 13 weeks post op on 02/29/2024    Manual therapy Seated STM L anterior knee to improve fascial mobility.     Therapeutic activity  Pt observed to enter using NBQC on L side today instead of rw  Nustep seat 10, arms 10 level 1 for 5 minutes for joint nutrition and decreased R knee joint stiffness.   Standing knee flexion stretch onto first stair step with B UE assist 10x5 seconds for 2 sets  Standing TKE 7x10 seconds to promote knee extension ROM  Forward step up onto 4 inch step with one riser regular step with B UE assist   R 10x3  Standing static mini lunge with b UE assist  R 10x      Improved technique, movement at target joints, use of target muscles after mod verbal, visual, tactile cues.       PATIENT EDUCATION:  Education details: there-ex, HEP, POC Person educated: Patient Education method: Explanation, Demonstration, Tactile cues, Verbal cues, and Handouts Education comprehension: verbalized understanding and returned demonstration  HOME EXERCISE PROGRAM: Access Code: [email or web address] URL: [email or web address] Date: 12/28/2023 Prepared by: Emil Glassman  Exercises - Supine Quad Set  - 3 x daily - 7 x weekly - 3 sets - 10 reps - 5 seconds hold - Seated Knee Flexion AAROM  - 3 x daily - 7 x weekly - 3 sets - 10 reps - 5 seconds hold - Seated Heel Toe Raises  - 3 x daily - 7 x weekly - 3 sets - 10 reps - Seated Long Arc Quad  - 2 x daily - 7 x weekly - 2 sets - 10 reps - 5 seconds hold  - LE neural flossing  - 1 x daily - 7 x weekly - 3 sets - 10 reps  - Standing Quad Set  - 3 x daily - 7 x weekly - 3 sets - 10 reps - 5 seconds hold - Standing Hip Abduction with Counter Support  - 2 x daily - 7 x weekly - 1 sets - 8 reps - 5 seconds hold  - Seated Flexion Stretch  - 3 x daily - 7 x weekly - 3 sets - 8  reps - 10 seconds hold  - Mini Squat with Counter Support  - 1 x daily - 7 x weekly - 2 sets - 7 reps ASSESSMENT:  CLINICAL IMPRESSION: Continued working on improving R knee ROM, as well as functional LE strength to promote ability to ambulate as well as negotiate stairs with less difficulty. Challenges to progress include knee stiffness before and after procedure as well as pain. Pt tolerated session well without aggravation of symptoms. Pt will benefit from continued skilled physical therapy services to improve R knee ROM, R LE strength, function, and ability to ambulate with less difficulty.     OBJECTIVE IMPAIRMENTS: Abnormal gait, decreased balance, difficulty walking, decreased ROM, decreased strength, improper body mechanics, postural dysfunction, and pain.   ACTIVITY LIMITATIONS: carrying, lifting, bending, standing, squatting, stairs, transfers, bed mobility, dressing, and locomotion level  PARTICIPATION LIMITATIONS:   PERSONAL FACTORS: Age, Fitness, Past/current experiences, Time since onset of injury/illness/exacerbation, and 3+ comorbidities: arthritis, hx of  DVT 2013, dyspnea, HTN, PVD, PE 2013 are also affecting patient's functional outcome.   REHAB POTENTIAL: Fair    CLINICAL DECISION MAKING: Stable/uncomplicated  EVALUATION COMPLEXITY: Low   GOALS: Goals reviewed with patient? Yes  SHORT TERM GOALS: Target date: 01/06/2024  Pt will be independent with her initial HEP to improve R knee ROM, strength, and ability to ambulate and perform transfers with less difficulty.  Baseline: Pt has started her initial HEP (12/28/2023); No questions (02/08/2024) Goal status: MET  LONG TERM GOALS: Target date: 03/30/2024  Pt will be able to ambulate at least 500 ft without AD and no LOB to promote mobility.  Baseline: Pt currently ambulating with rw. (12/28/2023);  Gait with SPC on L 20 ft, mod A. unsteady secondary to weakness; currently ambulates with rw (02/03/2024); at least 20 ft  with [facility] on L side, Min A to CGA, improved steadiness (02/22/2024) Goal status: PROGRESSING  2.  Pt will improve R knee flexion AROM to 115 degrees or more and R knee extension AROM to -5 degrees or more to promote ability to ambulate, perform standing tasks, perform transfers and negotiate stairs with less difficulty.  Baseline:  Active  Right eval R 02/03/2024 R 02/22/2024  Knee flexion 56 (60 AAROM, stiff end feel) 75 degrees 71  Knee extension -24 (-15 AAROM), stiff end feel -15 degrees -13   Goal status: PROGRESSING  3.  Pt will improve her R knee flexion and extension strength by at least 1/2 MMT grade to promote ability to ambulate, perform standing tasks with less difficulty.  Baseline:  MMT Right eval R 02/03/2024 R 02/22/2024  Knee flexion 3+ 4- 4  Knee extension 4 5 5    Goal status: MET  4.  Pt will improve her LEFS score by at least 20 points as a demonstration of improved function.  Baseline: 7/80 (12/28/2023); 22/80 (02/08/2024) Goal status: PROGRESSING  PLAN:  PT FREQUENCY: 1-2x/week  PT DURATION: other: 5 weeks  PLANNED INTERVENTIONS: 97110-Therapeutic exercises, 97530- Therapeutic activity, 97112- Neuromuscular re-education, 97535- Self Care, [id number]- Manual therapy, [phone]- Gait training, [phone]- Electrical stimulation (unattended), [phone]- Ionotophoresis 4mg /ml Dexamethasone , Patient/Family education, Balance training, Joint mobilization, and Scar mobilization.  PLAN FOR NEXT SESSION: knee ROM, strengthening, gait, function, manual techniques, modalities PRN    Jamon Hayhurst, PT, DPT 02/29/2024, 2:25 PM  2:25 PM, 02/29/24  Physical Therapist - [place] [phone] (Office)

## 2024-03-05 ENCOUNTER — Encounter: Payer: Self-pay | Admitting: Radiology

## 2024-03-06 ENCOUNTER — Ambulatory Visit: Attending: Orthopedic Surgery

## 2024-03-06 DIAGNOSIS — G8929 Other chronic pain: Secondary | ICD-10-CM | POA: Insufficient documentation

## 2024-03-06 DIAGNOSIS — M25561 Pain in right knee: Secondary | ICD-10-CM | POA: Insufficient documentation

## 2024-03-06 DIAGNOSIS — R262 Difficulty in walking, not elsewhere classified: Secondary | ICD-10-CM | POA: Insufficient documentation

## 2024-03-06 DIAGNOSIS — M25661 Stiffness of right knee, not elsewhere classified: Secondary | ICD-10-CM | POA: Insufficient documentation

## 2024-03-06 NOTE — Therapy (Signed)
 OUTPATIENT PHYSICAL THERAPY  TREATMENT   Patient Name: Leah Roberts MRN: [id number] DOB:August 26, 1950, 73 y.o., female Today's Date: 03/06/2024  END OF SESSION:  PT End of Session - 03/06/24 1434     Visit Number 16    Number of Visits 27    Date for Recertification  03/30/24    Authorization Type Self pay    Progress Note Due on Visit 20    PT Start Time 1434    PT Stop Time 1512    PT Time Calculation (min) 38 min    Activity Tolerance Patient tolerated treatment well;Patient limited by pain    Behavior During Therapy [facility] for tasks assessed/performed                      Past Medical History:  Diagnosis Date   Arthritis    Complication of anesthesia    Hard to wake up   DVT (deep venous thrombosis) (HCC) 2013   Dyspnea    due to knee pain   GERD (gastroesophageal reflux disease)    History of blood transfusion    History of hypertension    Hypertension    Peripheral vascular disease    DVT   Pre-diabetes    Pulmonary embolism (HCC) 2013   Past Surgical History:  Procedure Laterality Date   APPENDECTOMY     PULMONARY THROMBECTOMY Bilateral 01/01/2022   Procedure: PULMONARY THROMBECTOMY;  Surgeon: Jama Cordella MATSU, MD;  Location: [facility] INVASIVE CV LAB;  Service: Cardiovascular;  Laterality: Bilateral;   TONSILLECTOMY AND ADENOIDECTOMY     TOTAL KNEE ARTHROPLASTY Left 12/09/2021   Procedure: LEFT TOTAL KNEE ARTHROPLASTY;  Surgeon: Harden Jerona GAILS, MD;  Location: [facility] OR;  Service: Orthopedics;  Laterality: Left;   TOTAL KNEE ARTHROPLASTY Right 11/30/2023   Procedure: ARTHROPLASTY, KNEE, TOTAL;  Surgeon: Harden Jerona GAILS, MD;  Location: [facility] OR;  Service: Orthopedics;  Laterality: Right;   Patient Active Problem List   Diagnosis Date Noted   S/P total knee replacement, right 12/04/2023   Total knee replacement status, right 11/30/2023   Unilateral primary osteoarthritis, right knee 11/30/2023   Anticoagulated 11/23/2023   Burn 06/27/2023   Hemorrhoid  03/23/2023   RVF (right ventricular failure) (HCC) 01/12/2023   Atopic dermatitis 01/12/2023   Shortness of breath 08/29/2022   Annual physical exam 08/29/2022   Right groin pain 01/13/2022   Bilateral pulmonary embolism with right heart strain RV/LV ratio 1:5 (HCC) 12/31/2021   Acute respiratory failure with hypoxia (HCC) 12/31/2021   Total knee replacement status, left 12/09/21 12/09/2021   Unilateral primary osteoarthritis, left knee    Shoulder disorder 09/03/2021   Arthritis of knee 09/03/2021   Lumbar radiculopathy 09/03/2021   Abnormal MRI, knee 09/03/2021   Chronic pain of right knee 09/03/2021   Bronchitis 03/25/2021   Anemia 01/09/2020   HTN (hypertension) 12/31/2019   Chest pain 12/31/2019   DOE (dyspnea on exertion) 12/31/2019   Acute coronary syndrome (HCC) 12/31/2019   Unstable angina (HCC) 12/31/2019   Atherosclerosis 08/23/2018   Screening for tuberculosis 07/12/2018   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   Arthralgia 07/12/2018   Solitary pulmonary nodule 07/12/2018   Neck pain 02/15/2018   Dizziness 02/15/2018   Elevated TSH 02/15/2018   Screening for HIV (human immunodeficiency virus) 10/05/2017   Neuropathy 10/05/2017   Screening for colon cancer 10/05/2017   Hyperlipidemia 08/04/2016   Prediabetes 08/04/2016   Back pain 08/04/2016   Bilateral knee pain 06/18/2016   History of DVT of lower  extremity 04/01/2014   History of DVT and PE (deep vein thrombosis) 05/04/2011    PCP: Dineen Rollene MATSU, FNP   REFERRING PROVIDER: Harden Jerona GAILS, MD  REFERRING DIAG: Diagnosis [phone] (ICD-10-CM) - S/P total knee replacement, right  Rationale for Evaluation and Treatment: Rehabilitation  THERAPY DIAG:  Chronic pain of right knee  Difficulty in walking, not elsewhere classified  Stiffness of right knee, not elsewhere classified  ONSET DATE: S/P R TKA on 11/30/2023  SUBJECTIVE:                                                                                                                                                                                            SUBJECTIVE STATEMENT: R knee is getting there. Still tight and has the swelling on the bottom of her foot.    Moving to [place]  this December 2025 for good to help her son with his 4 kids. The bedrooms are on the 2nd floor.     PERTINENT HISTORY:  S/P R TKA on 11/30/2023. Has only had to 2 PT home health follow up treatments. Sister brought her to PT today. Has been icing her knee and propping it up.  PAIN:  Are you having pain? 9/10 this morning, running late early AM and forgot her pain meds and is still stiff from overnight sleep.   PRECAUTIONS: Knee  RED FLAGS: Bowel or bladder incontinence: No and Cauda equina syndrome: No   WEIGHT BEARING RESTRICTIONS: WBAT  FALLS:  Has patient fallen in last 6 months? No  LIVING ENVIRONMENT: Lives with: sister Lives in: House/apartment Stairs: No Has following equipment at home: Environmental Consultant - 2 wheeled  OCCUPATION: Retired  PLOF: Mod I with gait using SPC  PATIENT GOALS: be able to stand up and walk.   NEXT MD VISIT: 01/09/2024  OBJECTIVE:  Note: Objective measures were completed at Evaluation unless otherwise noted.   PATIENT SURVEYS   Extreme difficulty/unable (0), Quite a bit of difficulty (1), Moderate difficulty (2), Little difficulty (3), No difficulty (4) Survey date:  02/08/2024  Any of your usual work, housework or school activities 2  2. Usual hobbies, recreational or sporting activities 0  3. Getting into/out of the bath 3  4. Walking between rooms 0  5. Putting on socks/shoes 2  6. Squatting  0  7. Lifting an object, like a bag of groceries from the floor 2  8. Performing light activities around your home 2  9. Performing heavy activities around your home 0  10. Getting into/out of a car 3  11. Walking 2 blocks 0  12. Walking 1 mile 0  13. Going  up/down 10 stairs (1 flight) 0  14. Standing for 1 hour 2  15.  sitting  for 1 hour 4  16. Running on even ground 0  17. Running on uneven ground 0  18. Making sharp turns while running fast 0  19. Hopping  0  20. Rolling over in bed 2  Score total:  22/80     TREATMENT 03/06/24            Finished 14 weeks post op on 03/07/2024  Therapeutic activity  Pt observed to enter using NBQC on L side today instead of rw  Nustep seat 10, arms 10 level 1 for 5 minutes for joint nutrition and decreased R knee joint stiffness.   Standing knee flexion stretch onto first stair step with B UE assist 10x5 seconds for 2 sets  Standing TKE 10x5 seconds for 2 sets to promote knee extension ROM  Forward step up onto first regular step with B UE assist   R 10x3  SLS on R LE with B UE assist 5x5 seconds for 3 sets  Improved technique, movement at target joints, use of target muscles after mod verbal, visual, tactile cues.       PATIENT EDUCATION:  Education details: there-ex, HEP, POC Person educated: Patient Education method: Explanation, Demonstration, Tactile cues, Verbal cues, and Handouts Education comprehension: verbalized understanding and returned demonstration  HOME EXERCISE PROGRAM: Access Code: [email or web address] URL: [email or web address] Date: 12/28/2023 Prepared by: Emil Glassman  Exercises - Supine Quad Set  - 3 x daily - 7 x weekly - 3 sets - 10 reps - 5 seconds hold - Seated Knee Flexion AAROM  - 3 x daily - 7 x weekly - 3 sets - 10 reps - 5 seconds hold - Seated Heel Toe Raises  - 3 x daily - 7 x weekly - 3 sets - 10 reps - Seated Long Arc Quad  - 2 x daily - 7 x weekly - 2 sets - 10 reps - 5 seconds hold  - LE neural flossing  - 1 x daily - 7 x weekly - 3 sets - 10 reps  - Standing Quad Set  - 3 x daily - 7 x weekly - 3 sets - 10 reps - 5 seconds hold - Standing Hip Abduction with Counter Support  - 2 x daily - 7 x weekly - 1 sets - 8 reps - 5 seconds hold  - Seated Flexion Stretch  - 3 x daily - 7 x weekly - 3 sets - 8 reps - 10  seconds hold  - Mini Squat with Counter Support  - 1 x daily - 7 x weekly - 2 sets - 7 reps ASSESSMENT:  CLINICAL IMPRESSION: Continued working on improving R knee flexion and extension ROM secondary to stiffness. Continued with functional LE strengthening to promote ability to ambulate and perform standing tasks with less difficulty. Challenges to progress include knee stiffness before and after procedure as well as pain. Pt tolerated session well without aggravation of symptoms. Pt will benefit from continued skilled physical therapy services to improve R knee ROM, R LE strength, function, and ability to ambulate with less difficulty.     OBJECTIVE IMPAIRMENTS: Abnormal gait, decreased balance, difficulty walking, decreased ROM, decreased strength, improper body mechanics, postural dysfunction, and pain.   ACTIVITY LIMITATIONS: carrying, lifting, bending, standing, squatting, stairs, transfers, bed mobility, dressing, and locomotion level  PARTICIPATION LIMITATIONS:   PERSONAL FACTORS: Age, Fitness, Past/current experiences, Time since onset of injury/illness/exacerbation, and 3+  comorbidities: arthritis, hx of DVT 2013, dyspnea, HTN, PVD, PE 2013 are also affecting patient's functional outcome.   REHAB POTENTIAL: Fair    CLINICAL DECISION MAKING: Stable/uncomplicated  EVALUATION COMPLEXITY: Low   GOALS: Goals reviewed with patient? Yes  SHORT TERM GOALS: Target date: 01/06/2024  Pt will be independent with her initial HEP to improve R knee ROM, strength, and ability to ambulate and perform transfers with less difficulty.  Baseline: Pt has started her initial HEP (12/28/2023); No questions (02/08/2024) Goal status: MET  LONG TERM GOALS: Target date: 03/30/2024  Pt will be able to ambulate at least 500 ft without AD and no LOB to promote mobility.  Baseline: Pt currently ambulating with rw. (12/28/2023);  Gait with SPC on L 20 ft, mod A. unsteady secondary to weakness; currently  ambulates with rw (02/03/2024); at least 20 ft with [facility] on L side, Min A to CGA, improved steadiness (02/22/2024) Goal status: PROGRESSING  2.  Pt will improve R knee flexion AROM to 115 degrees or more and R knee extension AROM to -5 degrees or more to promote ability to ambulate, perform standing tasks, perform transfers and negotiate stairs with less difficulty.  Baseline:  Active  Right eval R 02/03/2024 R 02/22/2024  Knee flexion 56 (60 AAROM, stiff end feel) 75 degrees 71  Knee extension -24 (-15 AAROM), stiff end feel -15 degrees -13   Goal status: PROGRESSING  3.  Pt will improve her R knee flexion and extension strength by at least 1/2 MMT grade to promote ability to ambulate, perform standing tasks with less difficulty.  Baseline:  MMT Right eval R 02/03/2024 R 02/22/2024  Knee flexion 3+ 4- 4  Knee extension 4 5 5    Goal status: MET  4.  Pt will improve her LEFS score by at least 20 points as a demonstration of improved function.  Baseline: 7/80 (12/28/2023); 22/80 (02/08/2024) Goal status: PROGRESSING  PLAN:  PT FREQUENCY: 1-2x/week  PT DURATION: other: 5 weeks  PLANNED INTERVENTIONS: 97110-Therapeutic exercises, 97530- Therapeutic activity, 97112- Neuromuscular re-education, 97535- Self Care, 02859- Manual therapy, [phone]- Gait training, [phone]- Electrical stimulation (unattended), [phone]- Ionotophoresis 4mg /ml Dexamethasone , Patient/Family education, Balance training, Joint mobilization, and Scar mobilization.  PLAN FOR NEXT SESSION: knee ROM, strengthening, gait, function, manual techniques, modalities PRN    Asriel Westrup, PT, DPT 03/06/2024, 3:15 PM  3:15 PM, 03/06/24  Physical Therapist - [place] [phone] (Office)

## 2024-03-08 ENCOUNTER — Ambulatory Visit

## 2024-03-08 DIAGNOSIS — M25661 Stiffness of right knee, not elsewhere classified: Secondary | ICD-10-CM

## 2024-03-08 DIAGNOSIS — R262 Difficulty in walking, not elsewhere classified: Secondary | ICD-10-CM

## 2024-03-08 DIAGNOSIS — G8929 Other chronic pain: Secondary | ICD-10-CM

## 2024-03-08 NOTE — Therapy (Signed)
 OUTPATIENT PHYSICAL THERAPY  TREATMENT   Patient Name: Leah Roberts MRN: [id number] DOB:08/13/1950, 73 y.o., female Today's Date: 03/08/2024  END OF SESSION:  PT End of Session - 03/08/24 1522     Number of Visits 27    Date for Recertification  03/30/24    Authorization Type Self pay    Progress Note Due on Visit 20    PT Start Time 1522    PT Stop Time 1600    PT Time Calculation (min) 38 min    Activity Tolerance Patient tolerated treatment well;Patient limited by pain    Behavior During Therapy [facility] for tasks assessed/performed                       Past Medical History:  Diagnosis Date   Arthritis    Complication of anesthesia    Hard to wake up   DVT (deep venous thrombosis) (HCC) 2013   Dyspnea    due to knee pain   GERD (gastroesophageal reflux disease)    History of blood transfusion    History of hypertension    Hypertension    Peripheral vascular disease    DVT   Pre-diabetes    Pulmonary embolism (HCC) 2013   Past Surgical History:  Procedure Laterality Date   APPENDECTOMY     PULMONARY THROMBECTOMY Bilateral 01/01/2022   Procedure: PULMONARY THROMBECTOMY;  Surgeon: Jama Cordella MATSU, MD;  Location: [facility] INVASIVE CV LAB;  Service: Cardiovascular;  Laterality: Bilateral;   TONSILLECTOMY AND ADENOIDECTOMY     TOTAL KNEE ARTHROPLASTY Left 12/09/2021   Procedure: LEFT TOTAL KNEE ARTHROPLASTY;  Surgeon: Harden Jerona GAILS, MD;  Location: [facility] OR;  Service: Orthopedics;  Laterality: Left;   TOTAL KNEE ARTHROPLASTY Right 11/30/2023   Procedure: ARTHROPLASTY, KNEE, TOTAL;  Surgeon: Harden Jerona GAILS, MD;  Location: [facility] OR;  Service: Orthopedics;  Laterality: Right;   Patient Active Problem List   Diagnosis Date Noted   S/P total knee replacement, right 12/04/2023   Total knee replacement status, right 11/30/2023   Unilateral primary osteoarthritis, right knee 11/30/2023   Anticoagulated 11/23/2023   Burn 06/27/2023   Hemorrhoid 03/23/2023   RVF  (right ventricular failure) (HCC) 01/12/2023   Atopic dermatitis 01/12/2023   Shortness of breath 08/29/2022   Annual physical exam 08/29/2022   Right groin pain 01/13/2022   Bilateral pulmonary embolism with right heart strain RV/LV ratio 1:5 (HCC) 12/31/2021   Acute respiratory failure with hypoxia (HCC) 12/31/2021   Total knee replacement status, left 12/09/21 12/09/2021   Unilateral primary osteoarthritis, left knee    Shoulder disorder 09/03/2021   Arthritis of knee 09/03/2021   Lumbar radiculopathy 09/03/2021   Abnormal MRI, knee 09/03/2021   Chronic pain of right knee 09/03/2021   Bronchitis 03/25/2021   Anemia 01/09/2020   HTN (hypertension) 12/31/2019   Chest pain 12/31/2019   DOE (dyspnea on exertion) 12/31/2019   Acute coronary syndrome (HCC) 12/31/2019   Unstable angina (HCC) 12/31/2019   Atherosclerosis 08/23/2018   Screening for tuberculosis 07/12/2018   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   Arthralgia 07/12/2018   Solitary pulmonary nodule 07/12/2018   Neck pain 02/15/2018   Dizziness 02/15/2018   Elevated TSH 02/15/2018   Screening for HIV (human immunodeficiency virus) 10/05/2017   Neuropathy 10/05/2017   Screening for colon cancer 10/05/2017   Hyperlipidemia 08/04/2016   Prediabetes 08/04/2016   Back pain 08/04/2016   Bilateral knee pain 06/18/2016   History of DVT of lower extremity 04/01/2014   History  of DVT and PE (deep vein thrombosis) 05/04/2011    PCP: Dineen Rollene MATSU, FNP   REFERRING PROVIDER: Harden Jerona GAILS, MD  REFERRING DIAG: Diagnosis [phone] (ICD-10-CM) - S/P total knee replacement, right  Rationale for Evaluation and Treatment: Rehabilitation  THERAPY DIAG:  Chronic pain of right knee  Difficulty in walking, not elsewhere classified  Stiffness of right knee, not elsewhere classified  ONSET DATE: S/P R TKA on 11/30/2023  SUBJECTIVE:                                                                                                                                                                                            SUBJECTIVE STATEMENT: R knee was very sore yesterday. Sore today. Some days are good, some days are bad. 8/10 currently. The Nustep machine helps loosen her knee. Might have pushed her knee bending too much at the stair step.      Moving to [place]  this December 2025 for good to help her son with his 4 kids. The bedrooms are on the 2nd floor.     PERTINENT HISTORY:  S/P R TKA on 11/30/2023. Has only had to 2 PT home health follow up treatments. Sister brought her to PT today. Has been icing her knee and propping it up.  PAIN:  Are you having pain? 9/10 this morning, running late early AM and forgot her pain meds and is still stiff from overnight sleep.   PRECAUTIONS: Knee  RED FLAGS: Bowel or bladder incontinence: No and Cauda equina syndrome: No   WEIGHT BEARING RESTRICTIONS: WBAT  FALLS:  Has patient fallen in last 6 months? No  LIVING ENVIRONMENT: Lives with: sister Lives in: House/apartment Stairs: No Has following equipment at home: Environmental Consultant - 2 wheeled  OCCUPATION: Retired  PLOF: Mod I with gait using SPC  PATIENT GOALS: be able to stand up and walk.   NEXT MD VISIT: 01/09/2024  OBJECTIVE:  Note: Objective measures were completed at Evaluation unless otherwise noted.   PATIENT SURVEYS   Extreme difficulty/unable (0), Quite a bit of difficulty (1), Moderate difficulty (2), Little difficulty (3), No difficulty (4) Survey date:  02/08/2024  Any of your usual work, housework or school activities 2  2. Usual hobbies, recreational or sporting activities 0  3. Getting into/out of the bath 3  4. Walking between rooms 0  5. Putting on socks/shoes 2  6. Squatting  0  7. Lifting an object, like a bag of groceries from the floor 2  8. Performing light activities around your home 2  9. Performing heavy activities around your home 0  10. Getting into/out of a  car 3  11. Walking 2 blocks 0  12.  Walking 1 mile 0  13. Going up/down 10 stairs (1 flight) 0  14. Standing for 1 hour 2  15.  sitting for 1 hour 4  16. Running on even ground 0  17. Running on uneven ground 0  18. Making sharp turns while running fast 0  19. Hopping  0  20. Rolling over in bed 2  Score total:  22/80     TREATMENT 03/08/24            Finished 14 weeks post op on 03/07/2024  Therapeutic activity  Pt observed to enter using NBQC on L side today instead of rw  Nustep seat 10, arms 10 level 1 for 5 minutes for joint nutrition and decreased R knee joint stiffness.   Standing with B UE assist   Hip abduction    R 10x5 seconds   Seated R knee flexion PROM with PT 10x3  Then AROM 10x5 seconds for 2 sets    75 degrees seated R knee flexion AROM afterwards  Standing TKE 10x5 seconds to promote knee extension ROM  Static standing mini lunge with B UE assist   R 10x    Improved technique, movement at target joints, use of target muscles after mod verbal, visual, tactile cues.   Manual therapy  Seated STM R anterior knee to promote knee flexion and decrease fascial stiffness.     PATIENT EDUCATION:  Education details: there-ex, HEP, POC Person educated: Patient Education method: Explanation, Demonstration, Tactile cues, Verbal cues, and Handouts Education comprehension: verbalized understanding and returned demonstration  HOME EXERCISE PROGRAM: Access Code: [email or web address] URL: [email or web address] Date: 12/28/2023 Prepared by: Emil Glassman  Exercises - Supine Quad Set  - 3 x daily - 7 x weekly - 3 sets - 10 reps - 5 seconds hold - Seated Knee Flexion AAROM  - 3 x daily - 7 x weekly - 3 sets - 10 reps - 5 seconds hold - Seated Heel Toe Raises  - 3 x daily - 7 x weekly - 3 sets - 10 reps - Seated Long Arc Quad  - 2 x daily - 7 x weekly - 2 sets - 10 reps - 5 seconds hold  - LE neural flossing  - 1 x daily - 7 x weekly - 3 sets - 10 reps  - Standing Quad Set  - 3 x daily - 7 x  weekly - 3 sets - 10 reps - 5 seconds hold - Standing Hip Abduction with Counter Support  - 2 x daily - 7 x weekly - 1 sets - 8 reps - 5 seconds hold  - Seated Flexion Stretch  - 3 x daily - 7 x weekly - 3 sets - 8 reps - 10 seconds hold  - Mini Squat with Counter Support  - 1 x daily - 7 x weekly - 2 sets - 7 reps ASSESSMENT:  CLINICAL IMPRESSION: Continued working on improving R knee ROM secondary to stiffness as well as glute and quad strengthening to promote ability to ambulate with less difficulty. Pt able to achieve 75 degrees seated R knee flexion AROM today after treatment.  Challenges to progress include knee stiffness before and after procedure as well as pain. Pt tolerated session well without aggravation of symptoms. Pt will benefit from continued skilled physical therapy services to improve R knee ROM, R LE strength, function, and ability to ambulate with less difficulty.     OBJECTIVE IMPAIRMENTS:  Abnormal gait, decreased balance, difficulty walking, decreased ROM, decreased strength, improper body mechanics, postural dysfunction, and pain.   ACTIVITY LIMITATIONS: carrying, lifting, bending, standing, squatting, stairs, transfers, bed mobility, dressing, and locomotion level  PARTICIPATION LIMITATIONS:   PERSONAL FACTORS: Age, Fitness, Past/current experiences, Time since onset of injury/illness/exacerbation, and 3+ comorbidities: arthritis, hx of DVT 2013, dyspnea, HTN, PVD, PE 2013 are also affecting patient's functional outcome.   REHAB POTENTIAL: Fair    CLINICAL DECISION MAKING: Stable/uncomplicated  EVALUATION COMPLEXITY: Low   GOALS: Goals reviewed with patient? Yes  SHORT TERM GOALS: Target date: 01/06/2024  Pt will be independent with her initial HEP to improve R knee ROM, strength, and ability to ambulate and perform transfers with less difficulty.  Baseline: Pt has started her initial HEP (12/28/2023); No questions (02/08/2024) Goal status: MET  LONG TERM  GOALS: Target date: 03/30/2024  Pt will be able to ambulate at least 500 ft without AD and no LOB to promote mobility.  Baseline: Pt currently ambulating with rw. (12/28/2023);  Gait with SPC on L 20 ft, mod A. unsteady secondary to weakness; currently ambulates with rw (02/03/2024); at least 20 ft with [facility] on L side, Min A to CGA, improved steadiness (02/22/2024) Goal status: PROGRESSING  2.  Pt will improve R knee flexion AROM to 115 degrees or more and R knee extension AROM to -5 degrees or more to promote ability to ambulate, perform standing tasks, perform transfers and negotiate stairs with less difficulty.  Baseline:  Active  Right eval R 02/03/2024 R 02/22/2024  Knee flexion 56 (60 AAROM, stiff end feel) 75 degrees 71  Knee extension -24 (-15 AAROM), stiff end feel -15 degrees -13   Goal status: PROGRESSING  3.  Pt will improve her R knee flexion and extension strength by at least 1/2 MMT grade to promote ability to ambulate, perform standing tasks with less difficulty.  Baseline:  MMT Right eval R 02/03/2024 R 02/22/2024  Knee flexion 3+ 4- 4  Knee extension 4 5 5    Goal status: MET  4.  Pt will improve her LEFS score by at least 20 points as a demonstration of improved function.  Baseline: 7/80 (12/28/2023); 22/80 (02/08/2024) Goal status: PROGRESSING  PLAN:  PT FREQUENCY: 1-2x/week  PT DURATION: other: 5 weeks  PLANNED INTERVENTIONS: 97110-Therapeutic exercises, 97530- Therapeutic activity, 97112- Neuromuscular re-education, 97535- Self Care, 02859- Manual therapy, [phone]- Gait training, [phone]- Electrical stimulation (unattended), [phone]- Ionotophoresis 4mg /ml Dexamethasone , Patient/Family education, Balance training, Joint mobilization, and Scar mobilization.  PLAN FOR NEXT SESSION: knee ROM, strengthening, gait, function, manual techniques, modalities PRN    Kadie Balestrieri, PT, DPT 03/08/2024, 4:12 PM  4:12 PM, 03/08/24  Physical Therapist - [facility] [phone] (Office)

## 2024-03-13 ENCOUNTER — Ambulatory Visit

## 2024-03-13 DIAGNOSIS — M25661 Stiffness of right knee, not elsewhere classified: Secondary | ICD-10-CM

## 2024-03-13 DIAGNOSIS — R262 Difficulty in walking, not elsewhere classified: Secondary | ICD-10-CM

## 2024-03-13 DIAGNOSIS — G8929 Other chronic pain: Secondary | ICD-10-CM

## 2024-03-13 NOTE — Therapy (Signed)
 OUTPATIENT PHYSICAL THERAPY  TREATMENT   Patient Name: Leah Roberts MRN: [id number] DOB:Dec 04, 1950, 73 y.o., female Today's Date: 03/13/2024  END OF SESSION:  PT End of Session - 03/13/24 1354     Visit Number 18    Number of Visits 27    Date for Recertification  03/30/24    Authorization Type Self pay    Progress Note Due on Visit 20    PT Start Time 1354    PT Stop Time 1434    PT Time Calculation (min) 40 min    Activity Tolerance Patient tolerated treatment well;Patient limited by pain    Behavior During Therapy [facility] for tasks assessed/performed                        Past Medical History:  Diagnosis Date   Arthritis    Complication of anesthesia    Hard to wake up   DVT (deep venous thrombosis) (HCC) 2013   Dyspnea    due to knee pain   GERD (gastroesophageal reflux disease)    History of blood transfusion    History of hypertension    Hypertension    Peripheral vascular disease    DVT   Pre-diabetes    Pulmonary embolism (HCC) 2013   Past Surgical History:  Procedure Laterality Date   APPENDECTOMY     PULMONARY THROMBECTOMY Bilateral 01/01/2022   Procedure: PULMONARY THROMBECTOMY;  Surgeon: Jama Cordella MATSU, MD;  Location: [facility] INVASIVE CV LAB;  Service: Cardiovascular;  Laterality: Bilateral;   TONSILLECTOMY AND ADENOIDECTOMY     TOTAL KNEE ARTHROPLASTY Left 12/09/2021   Procedure: LEFT TOTAL KNEE ARTHROPLASTY;  Surgeon: Harden Jerona GAILS, MD;  Location: [facility] OR;  Service: Orthopedics;  Laterality: Left;   TOTAL KNEE ARTHROPLASTY Right 11/30/2023   Procedure: ARTHROPLASTY, KNEE, TOTAL;  Surgeon: Harden Jerona GAILS, MD;  Location: [facility] OR;  Service: Orthopedics;  Laterality: Right;   Patient Active Problem List   Diagnosis Date Noted   S/P total knee replacement, right 12/04/2023   Total knee replacement status, right 11/30/2023   Unilateral primary osteoarthritis, right knee 11/30/2023   Anticoagulated 11/23/2023   Burn 06/27/2023    Hemorrhoid 03/23/2023   RVF (right ventricular failure) (HCC) 01/12/2023   Atopic dermatitis 01/12/2023   Shortness of breath 08/29/2022   Annual physical exam 08/29/2022   Right groin pain 01/13/2022   Bilateral pulmonary embolism with right heart strain RV/LV ratio 1:5 (HCC) 12/31/2021   Acute respiratory failure with hypoxia (HCC) 12/31/2021   Total knee replacement status, left 12/09/21 12/09/2021   Unilateral primary osteoarthritis, left knee    Shoulder disorder 09/03/2021   Arthritis of knee 09/03/2021   Lumbar radiculopathy 09/03/2021   Abnormal MRI, knee 09/03/2021   Chronic pain of right knee 09/03/2021   Bronchitis 03/25/2021   Anemia 01/09/2020   HTN (hypertension) 12/31/2019   Chest pain 12/31/2019   DOE (dyspnea on exertion) 12/31/2019   Acute coronary syndrome (HCC) 12/31/2019   Unstable angina (HCC) 12/31/2019   Atherosclerosis 08/23/2018   Screening for tuberculosis 07/12/2018   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   Arthralgia 07/12/2018   Solitary pulmonary nodule 07/12/2018   Neck pain 02/15/2018   Dizziness 02/15/2018   Elevated TSH 02/15/2018   Screening for HIV (human immunodeficiency virus) 10/05/2017   Neuropathy 10/05/2017   Screening for colon cancer 10/05/2017   Hyperlipidemia 08/04/2016   Prediabetes 08/04/2016   Back pain 08/04/2016   Bilateral knee pain 06/18/2016   History of DVT  of lower extremity 04/01/2014   History of DVT and PE (deep vein thrombosis) 05/04/2011    PCP: Dineen Rollene MATSU, FNP   REFERRING PROVIDER: Harden Jerona GAILS, MD  REFERRING DIAG: Diagnosis [phone] (ICD-10-CM) - S/P total knee replacement, right  Rationale for Evaluation and Treatment: Rehabilitation  THERAPY DIAG:  Chronic pain of right knee  Difficulty in walking, not elsewhere classified  Stiffness of right knee, not elsewhere classified  ONSET DATE: S/P R TKA on 11/30/2023  SUBJECTIVE:                                                                                                                                                                                            SUBJECTIVE STATEMENT: Felt deep pain R knee this morning around 4-5 am this morning. 8/10 knee pain currently. Did more walking than normal the day before. Also has symptoms R lateral leg.      Moving to [place]  this December 2025 for good to help her son with his 4 kids. The bedrooms are on the 2nd floor.     PERTINENT HISTORY:  S/P R TKA on 11/30/2023. Has only had to 2 PT home health follow up treatments. Sister brought her to PT today. Has been icing her knee and propping it up.  PAIN:  Are you having pain? 9/10 this morning, running late early AM and forgot her pain meds and is still stiff from overnight sleep.   PRECAUTIONS: Knee  RED FLAGS: Bowel or bladder incontinence: No and Cauda equina syndrome: No   WEIGHT BEARING RESTRICTIONS: WBAT  FALLS:  Has patient fallen in last 6 months? No  LIVING ENVIRONMENT: Lives with: sister Lives in: House/apartment Stairs: No Has following equipment at home: Environmental Consultant - 2 wheeled  OCCUPATION: Retired  PLOF: Mod I with gait using SPC  PATIENT GOALS: be able to stand up and walk.   NEXT MD VISIT: 01/09/2024  OBJECTIVE:  Note: Objective measures were completed at Evaluation unless otherwise noted.   PATIENT SURVEYS   Extreme difficulty/unable (0), Quite a bit of difficulty (1), Moderate difficulty (2), Little difficulty (3), No difficulty (4) Survey date:  02/08/2024  Any of your usual work, housework or school activities 2  2. Usual hobbies, recreational or sporting activities 0  3. Getting into/out of the bath 3  4. Walking between rooms 0  5. Putting on socks/shoes 2  6. Squatting  0  7. Lifting an object, like a bag of groceries from the floor 2  8. Performing light activities around your home 2  9. Performing heavy activities around your home 0  10. Getting into/out of a  car 3  11. Walking 2 blocks 0  12. Walking  1 mile 0  13. Going up/down 10 stairs (1 flight) 0  14. Standing for 1 hour 2  15.  sitting for 1 hour 4  16. Running on even ground 0  17. Running on uneven ground 0  18. Making sharp turns while running fast 0  19. Hopping  0  20. Rolling over in bed 2  Score total:  22/80     TREATMENT 03/13/24            Finished 14 weeks post op on 03/07/2024  Therapeutic activity  Nustep seat 10, arms 10 level 1 for 5 minutes for joint nutrition and decreased R knee joint stiffness.   Pt states this helps with the knee pain  No abnormal warmth, no abnormal swelling or discoloration observed R leg.   Seated trunk flexion: increased R LE/L5 thigh and leg symptoms.   Sitting with lumbar towel roll with trunk extension   Decreased R L5 dermatome symptoms.   Standing with B UE assist   Hip abduction    R 10x5 seconds for 2 sets   L 10x5 seconds    Forward step up onto first regular step with B UE assist  R 5x3  Standing R knee flexion stretch onto first stair step with B UE assist   10x5 seconds     Improved technique, movement at target joints, use of target muscles after mod verbal, visual, tactile cues.   Manual therapy  Seated STM R anterior knee to promote knee flexion and decrease fascial stiffness.     PATIENT EDUCATION:  Education details: there-ex, HEP, POC Person educated: Patient Education method: Explanation, Demonstration, Tactile cues, Verbal cues, and Handouts Education comprehension: verbalized understanding and returned demonstration  HOME EXERCISE PROGRAM: Access Code: [email or web address] URL: [email or web address] Date: 12/28/2023 Prepared by: Emil Glassman  Exercises - Supine Quad Set  - 3 x daily - 7 x weekly - 3 sets - 10 reps - 5 seconds hold - Seated Knee Flexion AAROM  - 3 x daily - 7 x weekly - 3 sets - 10 reps - 5 seconds hold - Seated Heel Toe Raises  - 3 x daily - 7 x weekly - 3 sets - 10 reps - Seated Long Arc Quad  - 2 x daily - 7 x  weekly - 2 sets - 10 reps - 5 seconds hold  - LE neural flossing  - 1 x daily - 7 x weekly - 3 sets - 10 reps  - Standing Quad Set  - 3 x daily - 7 x weekly - 3 sets - 10 reps - 5 seconds hold - Standing Hip Abduction with Counter Support  - 2 x daily - 7 x weekly - 1 sets - 8 reps - 5 seconds hold  - Seated Flexion Stretch  - 3 x daily - 7 x weekly - 3 sets - 8 reps - 10 seconds hold  - Mini Squat with Counter Support  - 1 x daily - 7 x weekly - 2 sets - 7 reps   Sitting with lumbar towel roll with trunk extension   Decreased R L5 dermatome symptoms.    ASSESSMENT:  CLINICAL IMPRESSION: Decreased R L 5 dermatome pain from thigh to leg with gentle lumbar extension. Continued working on improving R knee ROM secondary to stiffness as well as glute and quad strengthening to promote ability to ambulate with less difficulty. Challenges to progress include knee  stiffness before and after procedure as well as pain. Pt tolerated session well without aggravation of symptoms. Pt will benefit from continued skilled physical therapy services to improve R knee ROM, R LE strength, function, and ability to ambulate with less difficulty.     OBJECTIVE IMPAIRMENTS: Abnormal gait, decreased balance, difficulty walking, decreased ROM, decreased strength, improper body mechanics, postural dysfunction, and pain.   ACTIVITY LIMITATIONS: carrying, lifting, bending, standing, squatting, stairs, transfers, bed mobility, dressing, and locomotion level  PARTICIPATION LIMITATIONS:   PERSONAL FACTORS: Age, Fitness, Past/current experiences, Time since onset of injury/illness/exacerbation, and 3+ comorbidities: arthritis, hx of DVT 2013, dyspnea, HTN, PVD, PE 2013 are also affecting patient's functional outcome.   REHAB POTENTIAL: Fair    CLINICAL DECISION MAKING: Stable/uncomplicated  EVALUATION COMPLEXITY: Low   GOALS: Goals reviewed with patient? Yes  SHORT TERM GOALS: Target date: 01/06/2024  Pt will  be independent with her initial HEP to improve R knee ROM, strength, and ability to ambulate and perform transfers with less difficulty.  Baseline: Pt has started her initial HEP (12/28/2023); No questions (02/08/2024) Goal status: MET  LONG TERM GOALS: Target date: 03/30/2024  Pt will be able to ambulate at least 500 ft without AD and no LOB to promote mobility.  Baseline: Pt currently ambulating with rw. (12/28/2023);  Gait with SPC on L 20 ft, mod A. unsteady secondary to weakness; currently ambulates with rw (02/03/2024); at least 20 ft with [facility] on L side, Min A to CGA, improved steadiness (02/22/2024) Goal status: PROGRESSING  2.  Pt will improve R knee flexion AROM to 115 degrees or more and R knee extension AROM to -5 degrees or more to promote ability to ambulate, perform standing tasks, perform transfers and negotiate stairs with less difficulty.  Baseline:  Active  Right eval R 02/03/2024 R 02/22/2024  Knee flexion 56 (60 AAROM, stiff end feel) 75 degrees 71  Knee extension -24 (-15 AAROM), stiff end feel -15 degrees -13   Goal status: PROGRESSING  3.  Pt will improve her R knee flexion and extension strength by at least 1/2 MMT grade to promote ability to ambulate, perform standing tasks with less difficulty.  Baseline:  MMT Right eval R 02/03/2024 R 02/22/2024  Knee flexion 3+ 4- 4  Knee extension 4 5 5    Goal status: MET  4.  Pt will improve her LEFS score by at least 20 points as a demonstration of improved function.  Baseline: 7/80 (12/28/2023); 22/80 (02/08/2024) Goal status: PROGRESSING  PLAN:  PT FREQUENCY: 1-2x/week  PT DURATION: other: 5 weeks  PLANNED INTERVENTIONS: 97110-Therapeutic exercises, 97530- Therapeutic activity, 97112- Neuromuscular re-education, 97535- Self Care, 02859- Manual therapy, [phone]- Gait training, [phone]- Electrical stimulation (unattended), [phone]- Ionotophoresis 4mg /ml Dexamethasone , Patient/Family education, Balance training, Joint  mobilization, and Scar mobilization.  PLAN FOR NEXT SESSION: knee ROM, strengthening, gait, function, manual techniques, modalities PRN    Maxi Rodas, PT, DPT 03/13/2024, 2:34 PM  2:34 PM, 03/13/24  Physical Therapist - [place] [phone] (Office)

## 2024-03-14 ENCOUNTER — Other Ambulatory Visit: Payer: Self-pay | Admitting: Orthopedic Surgery

## 2024-03-14 ENCOUNTER — Telehealth: Payer: Self-pay | Admitting: Orthopedic Surgery

## 2024-03-14 DIAGNOSIS — Z96651 Presence of right artificial knee joint: Secondary | ICD-10-CM

## 2024-03-14 NOTE — Telephone Encounter (Signed)
 Pt is s/p a right total knee replacement on 11/30/2023 and is asking for a revill of Oxycodone  last given #30 on 02/28/2024 please advise.

## 2024-03-14 NOTE — Telephone Encounter (Signed)
 Patient called and needs a refill on Oxycodone . [phone]

## 2024-03-15 ENCOUNTER — Ambulatory Visit

## 2024-03-15 ENCOUNTER — Other Ambulatory Visit: Payer: Self-pay | Admitting: Physician Assistant

## 2024-03-15 ENCOUNTER — Other Ambulatory Visit: Payer: Self-pay

## 2024-03-15 DIAGNOSIS — G8929 Other chronic pain: Secondary | ICD-10-CM

## 2024-03-15 DIAGNOSIS — R262 Difficulty in walking, not elsewhere classified: Secondary | ICD-10-CM

## 2024-03-15 DIAGNOSIS — M25661 Stiffness of right knee, not elsewhere classified: Secondary | ICD-10-CM

## 2024-03-15 DIAGNOSIS — Z96651 Presence of right artificial knee joint: Secondary | ICD-10-CM

## 2024-03-15 MED ORDER — OXYCODONE-ACETAMINOPHEN 5-325 MG PO TABS
1.0000 | ORAL_TABLET | Freq: Three times a day (TID) | ORAL | 0 refills | Status: DC | PRN
  Filled 2024-03-15 (×2): qty 30, 10d supply, fill #0

## 2024-03-15 NOTE — Therapy (Signed)
 OUTPATIENT PHYSICAL THERAPY  TREATMENT   Patient Name: Leah Roberts MRN: [id number] DOB:16-Oct-1950, 73 y.o., female Today's Date: 03/15/2024  END OF SESSION:  PT End of Session - 03/15/24 1438     Visit Number 19    Number of Visits 27    Date for Recertification  03/30/24    Authorization Type Self pay    Progress Note Due on Visit 20    PT Start Time 1438    PT Stop Time 1522    PT Time Calculation (min) 44 min    Activity Tolerance Patient tolerated treatment well;Patient limited by pain    Behavior During Therapy [facility] for tasks assessed/performed                         Past Medical History:  Diagnosis Date   Arthritis    Complication of anesthesia    Hard to wake up   DVT (deep venous thrombosis) (HCC) 2013   Dyspnea    due to knee pain   GERD (gastroesophageal reflux disease)    History of blood transfusion    History of hypertension    Hypertension    Peripheral vascular disease    DVT   Pre-diabetes    Pulmonary embolism (HCC) 2013   Past Surgical History:  Procedure Laterality Date   APPENDECTOMY     PULMONARY THROMBECTOMY Bilateral 01/01/2022   Procedure: PULMONARY THROMBECTOMY;  Surgeon: Jama Cordella MATSU, MD;  Location: [facility] INVASIVE CV LAB;  Service: Cardiovascular;  Laterality: Bilateral;   TONSILLECTOMY AND ADENOIDECTOMY     TOTAL KNEE ARTHROPLASTY Left 12/09/2021   Procedure: LEFT TOTAL KNEE ARTHROPLASTY;  Surgeon: Harden Jerona GAILS, MD;  Location: [facility] OR;  Service: Orthopedics;  Laterality: Left;   TOTAL KNEE ARTHROPLASTY Right 11/30/2023   Procedure: ARTHROPLASTY, KNEE, TOTAL;  Surgeon: Harden Jerona GAILS, MD;  Location: [facility] OR;  Service: Orthopedics;  Laterality: Right;   Patient Active Problem List   Diagnosis Date Noted   S/P total knee replacement, right 12/04/2023   Total knee replacement status, right 11/30/2023   Unilateral primary osteoarthritis, right knee 11/30/2023   Anticoagulated 11/23/2023   Burn 06/27/2023    Hemorrhoid 03/23/2023   RVF (right ventricular failure) (HCC) 01/12/2023   Atopic dermatitis 01/12/2023   Shortness of breath 08/29/2022   Annual physical exam 08/29/2022   Right groin pain 01/13/2022   Bilateral pulmonary embolism with right heart strain RV/LV ratio 1:5 (HCC) 12/31/2021   Acute respiratory failure with hypoxia (HCC) 12/31/2021   Total knee replacement status, left 12/09/21 12/09/2021   Unilateral primary osteoarthritis, left knee    Shoulder disorder 09/03/2021   Arthritis of knee 09/03/2021   Lumbar radiculopathy 09/03/2021   Abnormal MRI, knee 09/03/2021   Chronic pain of right knee 09/03/2021   Bronchitis 03/25/2021   Anemia 01/09/2020   HTN (hypertension) 12/31/2019   Chest pain 12/31/2019   DOE (dyspnea on exertion) 12/31/2019   Acute coronary syndrome (HCC) 12/31/2019   Unstable angina (HCC) 12/31/2019   Atherosclerosis 08/23/2018   Screening for tuberculosis 07/12/2018   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   Arthralgia 07/12/2018   Solitary pulmonary nodule 07/12/2018   Neck pain 02/15/2018   Dizziness 02/15/2018   Elevated TSH 02/15/2018   Screening for HIV (human immunodeficiency virus) 10/05/2017   Neuropathy 10/05/2017   Screening for colon cancer 10/05/2017   Hyperlipidemia 08/04/2016   Prediabetes 08/04/2016   Back pain 08/04/2016   Bilateral knee pain 06/18/2016   History of  DVT of lower extremity 04/01/2014   History of DVT and PE (deep vein thrombosis) 05/04/2011    PCP: Dineen Rollene MATSU, FNP   REFERRING PROVIDER: Harden Jerona GAILS, MD  REFERRING DIAG: Diagnosis [phone] (ICD-10-CM) - S/P total knee replacement, right  Rationale for Evaluation and Treatment: Rehabilitation  THERAPY DIAG:  Chronic pain of right knee  Difficulty in walking, not elsewhere classified  Stiffness of right knee, not elsewhere classified  ONSET DATE: S/P R TKA on 11/30/2023  SUBJECTIVE:                                                                                                                                                                                            SUBJECTIVE STATEMENT: 7/10 R knee pain currently. She feels like she is getting stronger.     Moving to [place]  this December 2025 for good to help her son with his 4 kids. The bedrooms are on the 2nd floor.     PERTINENT HISTORY:  S/P R TKA on 11/30/2023. Has only had to 2 PT home health follow up treatments. Sister brought her to PT today. Has been icing her knee and propping it up.  PAIN:  Are you having pain? 9/10 this morning, running late early AM and forgot her pain meds and is still stiff from overnight sleep.   PRECAUTIONS: Knee  RED FLAGS: Bowel or bladder incontinence: No and Cauda equina syndrome: No   WEIGHT BEARING RESTRICTIONS: WBAT  FALLS:  Has patient fallen in last 6 months? No  LIVING ENVIRONMENT: Lives with: sister Lives in: House/apartment Stairs: No Has following equipment at home: Environmental Consultant - 2 wheeled  OCCUPATION: Retired  PLOF: Mod I with gait using SPC  PATIENT GOALS: be able to stand up and walk.   NEXT MD VISIT: 01/09/2024  OBJECTIVE:  Note: Objective measures were completed at Evaluation unless otherwise noted.   PATIENT SURVEYS   Extreme difficulty/unable (0), Quite a bit of difficulty (1), Moderate difficulty (2), Little difficulty (3), No difficulty (4) Survey date:  02/08/2024  Any of your usual work, housework or school activities 2  2. Usual hobbies, recreational or sporting activities 0  3. Getting into/out of the bath 3  4. Walking between rooms 0  5. Putting on socks/shoes 2  6. Squatting  0  7. Lifting an object, like a bag of groceries from the floor 2  8. Performing light activities around your home 2  9. Performing heavy activities around your home 0  10. Getting into/out of a car 3  11. Walking 2 blocks 0  12. Walking 1 mile 0  13. Going up/down  10 stairs (1 flight) 0  14. Standing for 1 hour 2  15.  sitting for 1  hour 4  16. Running on even ground 0  17. Running on uneven ground 0  18. Making sharp turns while running fast 0  19. Hopping  0  20. Rolling over in bed 2  Score total:  22/80     TREATMENT 03/15/24            Finished 14 weeks post op on 03/07/2024  Therapeutic activity  Nustep seat 10, arms 10 level 1 for 5 minutes for joint nutrition and decreased R knee joint stiffness.   Pt states this helps with the knee pain   Standing lumbar extension 10x5 seconds   No abnormal warmth, swelling R leg.    Standing R knee flexion stretch onto first stair step with B UE assist   10x5 seconds for 2 sets  Forward step up onto first regular step with B UE assist  R 10x, then 5x  Seated knee flexion red band   R 10x2  Standing with B UE assist   Hip abduction    R 10x5 seconds      Improved technique, movement at target joints, use of target muscles after mod verbal, visual, tactile cues.   Manual therapy Seated STM R anterior knee to promote knee flexion and decrease fascial stiffness.  Seated STM R vastus lateralis to decrease muscle tension    PATIENT EDUCATION:  Education details: there-ex, HEP, POC Person educated: Patient Education method: Explanation, Demonstration, Tactile cues, Verbal cues, and Handouts Education comprehension: verbalized understanding and returned demonstration  HOME EXERCISE PROGRAM: Access Code: [email or web address] URL: [email or web address] Date: 12/28/2023 Prepared by: Emil Glassman  Exercises - Supine Quad Set  - 3 x daily - 7 x weekly - 3 sets - 10 reps - 5 seconds hold - Seated Knee Flexion AAROM  - 3 x daily - 7 x weekly - 3 sets - 10 reps - 5 seconds hold - Seated Heel Toe Raises  - 3 x daily - 7 x weekly - 3 sets - 10 reps - Seated Long Arc Quad  - 2 x daily - 7 x weekly - 2 sets - 10 reps - 5 seconds hold  - LE neural flossing  - 1 x daily - 7 x weekly - 3 sets - 10 reps  - Standing Quad Set  - 3 x daily - 7 x weekly - 3  sets - 10 reps - 5 seconds hold - Standing Hip Abduction with Counter Support  - 2 x daily - 7 x weekly - 1 sets - 8 reps - 5 seconds hold  - Seated Flexion Stretch  - 3 x daily - 7 x weekly - 3 sets - 8 reps - 10 seconds hold  - Mini Squat with Counter Support  - 1 x daily - 7 x weekly - 2 sets - 7 reps   Sitting with lumbar towel roll with trunk extension   Decreased R L5 dermatome symptoms.    ASSESSMENT:  CLINICAL IMPRESSION: Continued working on improving R knee flexion secondary to stiffness as well as R LE quadriceps, hamstrings, and glute med strengthening to help decrease difficulty with gait and stair negotiation. Improving strength reported by pt. Pt tolerated session well without aggravation of symptoms. Pt will benefit from continued skilled physical therapy services to improve R knee ROM, R LE strength, function, and ability to ambulate with less difficulty.     OBJECTIVE  IMPAIRMENTS: Abnormal gait, decreased balance, difficulty walking, decreased ROM, decreased strength, improper body mechanics, postural dysfunction, and pain.   ACTIVITY LIMITATIONS: carrying, lifting, bending, standing, squatting, stairs, transfers, bed mobility, dressing, and locomotion level  PARTICIPATION LIMITATIONS:   PERSONAL FACTORS: Age, Fitness, Past/current experiences, Time since onset of injury/illness/exacerbation, and 3+ comorbidities: arthritis, hx of DVT 2013, dyspnea, HTN, PVD, PE 2013 are also affecting patient's functional outcome.   REHAB POTENTIAL: Fair    CLINICAL DECISION MAKING: Stable/uncomplicated  EVALUATION COMPLEXITY: Low   GOALS: Goals reviewed with patient? Yes  SHORT TERM GOALS: Target date: 01/06/2024  Pt will be independent with her initial HEP to improve R knee ROM, strength, and ability to ambulate and perform transfers with less difficulty.  Baseline: Pt has started her initial HEP (12/28/2023); No questions (02/08/2024) Goal status: MET  LONG TERM GOALS:  Target date: 03/30/2024  Pt will be able to ambulate at least 500 ft without AD and no LOB to promote mobility.  Baseline: Pt currently ambulating with rw. (12/28/2023);  Gait with SPC on L 20 ft, mod A. unsteady secondary to weakness; currently ambulates with rw (02/03/2024); at least 20 ft with [facility] on L side, Min A to CGA, improved steadiness (02/22/2024) Goal status: PROGRESSING  2.  Pt will improve R knee flexion AROM to 115 degrees or more and R knee extension AROM to -5 degrees or more to promote ability to ambulate, perform standing tasks, perform transfers and negotiate stairs with less difficulty.  Baseline:  Active  Right eval R 02/03/2024 R 02/22/2024  Knee flexion 56 (60 AAROM, stiff end feel) 75 degrees 71  Knee extension -24 (-15 AAROM), stiff end feel -15 degrees -13   Goal status: PROGRESSING  3.  Pt will improve her R knee flexion and extension strength by at least 1/2 MMT grade to promote ability to ambulate, perform standing tasks with less difficulty.  Baseline:  MMT Right eval R 02/03/2024 R 02/22/2024  Knee flexion 3+ 4- 4  Knee extension 4 5 5    Goal status: MET  4.  Pt will improve her LEFS score by at least 20 points as a demonstration of improved function.  Baseline: 7/80 (12/28/2023); 22/80 (02/08/2024) Goal status: PROGRESSING  PLAN:  PT FREQUENCY: 1-2x/week  PT DURATION: other: 5 weeks  PLANNED INTERVENTIONS: 97110-Therapeutic exercises, 97530- Therapeutic activity, 97112- Neuromuscular re-education, 97535- Self Care, 02859- Manual therapy, [phone]- Gait training, [phone]- Electrical stimulation (unattended), [phone]- Ionotophoresis 4mg /ml Dexamethasone , Patient/Family education, Balance training, Joint mobilization, and Scar mobilization.  PLAN FOR NEXT SESSION: knee ROM, strengthening, gait, function, manual techniques, modalities PRN    Manar Smalling, PT, DPT 03/15/2024, 3:35 PM  3:35 PM, 03/15/24  Physical Therapist - [place] [phone]  (Office)

## 2024-03-15 NOTE — Telephone Encounter (Signed)
 I called and sw pt and she said that she is still having difficulty at night with pain and that is what she is using the Oxycodone  for and she wanted to know how many more refills she will have. I advised that she is 4 months out fro her total joint and should not be experiencing this level of pain. We are happy to see her in the office to evaluate. Pt sch appt next Friday.

## 2024-03-20 ENCOUNTER — Ambulatory Visit

## 2024-03-20 DIAGNOSIS — M25661 Stiffness of right knee, not elsewhere classified: Secondary | ICD-10-CM

## 2024-03-20 DIAGNOSIS — G8929 Other chronic pain: Secondary | ICD-10-CM

## 2024-03-20 DIAGNOSIS — R262 Difficulty in walking, not elsewhere classified: Secondary | ICD-10-CM

## 2024-03-20 NOTE — Therapy (Signed)
 OUTPATIENT PHYSICAL THERAPY  TREATMENT And Progress Report (02/08/2024 - 03/20/2024)   Patient Name: Leah Roberts MRN: [id number] DOB:08/14/1950, 73 y.o., female Today's Date: 03/20/2024  END OF SESSION:  PT End of Session - 03/20/24 1349     Visit Number 20    Number of Visits 27    Date for Recertification  03/30/24    Authorization Type Self pay    Progress Note Due on Visit 20    PT Start Time 1349    PT Stop Time 1428    PT Time Calculation (min) 39 min    Activity Tolerance Patient tolerated treatment well;Patient limited by pain    Behavior During Therapy [facility] for tasks assessed/performed                          Past Medical History:  Diagnosis Date   Arthritis    Complication of anesthesia    Hard to wake up   DVT (deep venous thrombosis) (HCC) 2013   Dyspnea    due to knee pain   GERD (gastroesophageal reflux disease)    History of blood transfusion    History of hypertension    Hypertension    Peripheral vascular disease    DVT   Pre-diabetes    Pulmonary embolism (HCC) 2013   Past Surgical History:  Procedure Laterality Date   APPENDECTOMY     PULMONARY THROMBECTOMY Bilateral 01/01/2022   Procedure: PULMONARY THROMBECTOMY;  Surgeon: Jama Cordella MATSU, MD;  Location: [facility] INVASIVE CV LAB;  Service: Cardiovascular;  Laterality: Bilateral;   TONSILLECTOMY AND ADENOIDECTOMY     TOTAL KNEE ARTHROPLASTY Left 12/09/2021   Procedure: LEFT TOTAL KNEE ARTHROPLASTY;  Surgeon: Harden Jerona GAILS, MD;  Location: [facility] OR;  Service: Orthopedics;  Laterality: Left;   TOTAL KNEE ARTHROPLASTY Right 11/30/2023   Procedure: ARTHROPLASTY, KNEE, TOTAL;  Surgeon: Harden Jerona GAILS, MD;  Location: [facility] OR;  Service: Orthopedics;  Laterality: Right;   Patient Active Problem List   Diagnosis Date Noted   S/P total knee replacement, right 12/04/2023   Total knee replacement status, right 11/30/2023   Unilateral primary osteoarthritis, right knee 11/30/2023    Anticoagulated 11/23/2023   Burn 06/27/2023   Hemorrhoid 03/23/2023   RVF (right ventricular failure) (HCC) 01/12/2023   Atopic dermatitis 01/12/2023   Shortness of breath 08/29/2022   Annual physical exam 08/29/2022   Right groin pain 01/13/2022   Bilateral pulmonary embolism with right heart strain RV/LV ratio 1:5 (HCC) 12/31/2021   Acute respiratory failure with hypoxia (HCC) 12/31/2021   Total knee replacement status, left 12/09/21 12/09/2021   Unilateral primary osteoarthritis, left knee    Shoulder disorder 09/03/2021   Arthritis of knee 09/03/2021   Lumbar radiculopathy 09/03/2021   Abnormal MRI, knee 09/03/2021   Chronic pain of right knee 09/03/2021   Bronchitis 03/25/2021   Anemia 01/09/2020   HTN (hypertension) 12/31/2019   Chest pain 12/31/2019   DOE (dyspnea on exertion) 12/31/2019   Acute coronary syndrome (HCC) 12/31/2019   Unstable angina (HCC) 12/31/2019   Atherosclerosis 08/23/2018   Screening for tuberculosis 07/12/2018   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   Arthralgia 07/12/2018   Solitary pulmonary nodule 07/12/2018   Neck pain 02/15/2018   Dizziness 02/15/2018   Elevated TSH 02/15/2018   Screening for HIV (human immunodeficiency virus) 10/05/2017   Neuropathy 10/05/2017   Screening for colon cancer 10/05/2017   Hyperlipidemia 08/04/2016   Prediabetes 08/04/2016   Back pain 08/04/2016   Bilateral  knee pain 06/18/2016   History of DVT of lower extremity 04/01/2014   History of DVT and PE (deep vein thrombosis) 05/04/2011    PCP: Dineen Rollene MATSU, FNP   REFERRING PROVIDER: Harden Jerona GAILS, MD  REFERRING DIAG: Diagnosis [phone] (ICD-10-CM) - S/P total knee replacement, right  Rationale for Evaluation and Treatment: Rehabilitation  THERAPY DIAG:  Chronic pain of right knee  Difficulty in walking, not elsewhere classified  Stiffness of right knee, not elsewhere classified  ONSET DATE: S/P R TKA on 11/30/2023  SUBJECTIVE:                                                                                                                                                                                            SUBJECTIVE STATEMENT: 7/10 R knee pain currently.      Moving to [place]  this December 2025 for good to help her son with his 4 kids. The bedrooms are on the 2nd floor.     PERTINENT HISTORY:  S/P R TKA on 11/30/2023. Has only had to 2 PT home health follow up treatments. Sister brought her to PT today. Has been icing her knee and propping it up.  PAIN:  Are you having pain? 9/10 this morning, running late early AM and forgot her pain meds and is still stiff from overnight sleep.   PRECAUTIONS: Knee  RED FLAGS: Bowel or bladder incontinence: No and Cauda equina syndrome: No   WEIGHT BEARING RESTRICTIONS: WBAT  FALLS:  Has patient fallen in last 6 months? No  LIVING ENVIRONMENT: Lives with: sister Lives in: House/apartment Stairs: No Has following equipment at home: Environmental Consultant - 2 wheeled  OCCUPATION: Retired  PLOF: Mod I with gait using SPC  PATIENT GOALS: be able to stand up and walk.   NEXT MD VISIT: 01/09/2024  OBJECTIVE:  Note: Objective measures were completed at Evaluation unless otherwise noted.   PATIENT SURVEYS   Extreme difficulty/unable (0), Quite a bit of difficulty (1), Moderate difficulty (2), Little difficulty (3), No difficulty (4) Survey date:  02/08/2024  Any of your usual work, housework or school activities 2  2. Usual hobbies, recreational or sporting activities 0  3. Getting into/out of the bath 3  4. Walking between rooms 0  5. Putting on socks/shoes 2  6. Squatting  0  7. Lifting an object, like a bag of groceries from the floor 2  8. Performing light activities around your home 2  9. Performing heavy activities around your home 0  10. Getting into/out of a car 3  11. Walking 2 blocks 0  12. Walking 1 mile 0  13. Going  up/down 10 stairs (1 flight) 0  14. Standing for 1 hour 2  15.   sitting for 1 hour 4  16. Running on even ground 0  17. Running on uneven ground 0  18. Making sharp turns while running fast 0  19. Hopping  0  20. Rolling over in bed 2  Score total:  22/80     TREATMENT 03/20/24            Finished 14 weeks post op on 03/07/2024  Therapeutic activity  Nustep seat 10, arms 10 level 1 for 5 minutes for joint nutrition and decreased R knee joint stiffness.   Gait with NBQC on L 500 ft with on rest break in sitting.     Seated R knee flexion and extension AROM   Standing R knee flexion stretch onto first stair step with B UE assist   10x5 seconds for 2 sets   Forward step up onto first regular step with B UE assist  R 10x  Standing lumbar extension 10x5 seconds   Decreased R LE throbbing sensation  Standing with B UE assist   Hip abduction    R 10x5 seconds, then 5x5 seconds      Improved technique, movement at target joints, use of target muscles after mod verbal, visual, tactile cues.      PATIENT EDUCATION:  Education details: there-ex, HEP, POC Person educated: Patient Education method: Explanation, Demonstration, Tactile cues, Verbal cues, and Handouts Education comprehension: verbalized understanding and returned demonstration  HOME EXERCISE PROGRAM: Access Code: [email or web address] URL: [email or web address] Date: 12/28/2023 Prepared by: Emil Glassman  Exercises - Supine Quad Set  - 3 x daily - 7 x weekly - 3 sets - 10 reps - 5 seconds hold - Seated Knee Flexion AAROM  - 3 x daily - 7 x weekly - 3 sets - 10 reps - 5 seconds hold - Seated Heel Toe Raises  - 3 x daily - 7 x weekly - 3 sets - 10 reps - Seated Long Arc Quad  - 2 x daily - 7 x weekly - 2 sets - 10 reps - 5 seconds hold  - LE neural flossing  - 1 x daily - 7 x weekly - 3 sets - 10 reps  - Standing Quad Set  - 3 x daily - 7 x weekly - 3 sets - 10 reps - 5 seconds hold - Standing Hip Abduction with Counter Support  - 2 x daily - 7 x weekly - 1 sets -  8 reps - 5 seconds hold  - Seated Flexion Stretch  - 3 x daily - 7 x weekly - 3 sets - 8 reps - 10 seconds hold  - Mini Squat with Counter Support  - 1 x daily - 7 x weekly - 2 sets - 7 reps   Sitting with lumbar towel roll with trunk extension   Decreased R L5 dermatome symptoms.    ASSESSMENT:  CLINICAL IMPRESSION:   Improving ability to ambulate with least restrictive AD (NBQC) for longer distances. R knee ROM still demonstrates stiffness. Continued working on improving R knee flexion secondary to stiffness as well as R LE quadriceps and glute med strengthening to help decrease difficulty with gait and stair negotiation. Pt will benefit from continued skilled physical therapy services to improve R knee ROM, R LE strength, function, and ability to ambulate with less difficulty.     OBJECTIVE IMPAIRMENTS: Abnormal gait, decreased balance, difficulty walking, decreased ROM, decreased strength, improper body  mechanics, postural dysfunction, and pain.   ACTIVITY LIMITATIONS: carrying, lifting, bending, standing, squatting, stairs, transfers, bed mobility, dressing, and locomotion level  PARTICIPATION LIMITATIONS:   PERSONAL FACTORS: Age, Fitness, Past/current experiences, Time since onset of injury/illness/exacerbation, and 3+ comorbidities: arthritis, hx of DVT 2013, dyspnea, HTN, PVD, PE 2013 are also affecting patient's functional outcome.   REHAB POTENTIAL: Fair    CLINICAL DECISION MAKING: Stable/uncomplicated  EVALUATION COMPLEXITY: Low   GOALS: Goals reviewed with patient? Yes  SHORT TERM GOALS: Target date: 01/06/2024  Pt will be independent with her initial HEP to improve R knee ROM, strength, and ability to ambulate and perform transfers with less difficulty.  Baseline: Pt has started her initial HEP (12/28/2023); No questions (02/08/2024) Goal status: MET  LONG TERM GOALS: Target date: 03/30/2024  Pt will be able to ambulate at least 500 ft without AD and no LOB to  promote mobility.  Baseline: Pt currently ambulating with rw. (12/28/2023);  Gait with SPC on L 20 ft, mod A. unsteady secondary to weakness; currently ambulates with rw (02/03/2024); at least 20 ft with [facility] on L side, Min A to CGA, improved steadiness (02/22/2024); 500 ft with NBQC with 1 sitting rest break (03/20/2024) Goal status: PROGRESSING  2.  Pt will improve R knee flexion AROM to 115 degrees or more and R knee extension AROM to -5 degrees or more to promote ability to ambulate, perform standing tasks, perform transfers and negotiate stairs with less difficulty.  Baseline:  Active  Right eval R 02/03/2024 R 02/22/2024 R 03/20/2024  Knee flexion 56 (60 AAROM, stiff end feel) 75 degrees 71 74 degrees  Knee extension -24 (-15 AAROM), stiff end feel -15 degrees -13 -15 degrees   Goal status: PROGRESSING  3.  Pt will improve her R knee flexion and extension strength by at least 1/2 MMT grade to promote ability to ambulate, perform standing tasks with less difficulty.  Baseline:  MMT Right eval R 02/03/2024 R 02/22/2024  Knee flexion 3+ 4- 4  Knee extension 4 5 5    Goal status: MET  4.  Pt will improve her LEFS score by at least 20 points as a demonstration of improved function.  Baseline: 7/80 (12/28/2023); 22/80 (02/08/2024) Goal status: PROGRESSING  PLAN:  PT FREQUENCY: 1-2x/week  PT DURATION: other: 5 weeks  PLANNED INTERVENTIONS: 97110-Therapeutic exercises, 97530- Therapeutic activity, 97112- Neuromuscular re-education, 97535- Self Care, 02859- Manual therapy, [phone]- Gait training, [phone]- Electrical stimulation (unattended), [phone]- Ionotophoresis 4mg /ml Dexamethasone , Patient/Family education, Balance training, Joint mobilization, and Scar mobilization.  PLAN FOR NEXT SESSION: knee ROM, strengthening, gait, function, manual techniques, modalities PRN   Thank you for your referral.  Jayren Cease, PT, DPT 03/20/2024, 2:35 PM  2:35 PM, 03/20/24  Physical Therapist -  [place] [phone] (Office)

## 2024-03-21 NOTE — Telephone Encounter (Signed)
 open in error

## 2024-03-22 ENCOUNTER — Ambulatory Visit

## 2024-03-22 DIAGNOSIS — G8929 Other chronic pain: Secondary | ICD-10-CM

## 2024-03-22 DIAGNOSIS — R262 Difficulty in walking, not elsewhere classified: Secondary | ICD-10-CM

## 2024-03-22 DIAGNOSIS — M25661 Stiffness of right knee, not elsewhere classified: Secondary | ICD-10-CM

## 2024-03-22 NOTE — Therapy (Signed)
 OUTPATIENT PHYSICAL THERAPY  TREATMENT And Progress Report (02/08/2024 - 03/20/2024)   Patient Name: Leah Roberts MRN: [id number] DOB:24-Dec-1950, 73 y.o., female Today's Date: 03/22/2024  END OF SESSION:  PT End of Session - 03/22/24 1348     Visit Number 21    Number of Visits 27    Date for Recertification  03/30/24    Authorization Type Self pay    Progress Note Due on Visit 20    PT Start Time 1348    PT Stop Time 1427    PT Time Calculation (min) 39 min    Activity Tolerance Patient tolerated treatment well;Patient limited by pain    Behavior During Therapy [facility] for tasks assessed/performed                          Past Medical History:  Diagnosis Date   Arthritis    Complication of anesthesia    Hard to wake up   DVT (deep venous thrombosis) (HCC) 2013   Dyspnea    due to knee pain   GERD (gastroesophageal reflux disease)    History of blood transfusion    History of hypertension    Hypertension    Peripheral vascular disease    DVT   Pre-diabetes    Pulmonary embolism (HCC) 2013   Past Surgical History:  Procedure Laterality Date   APPENDECTOMY     PULMONARY THROMBECTOMY Bilateral 01/01/2022   Procedure: PULMONARY THROMBECTOMY;  Surgeon: Jama Cordella MATSU, MD;  Location: [facility] INVASIVE CV LAB;  Service: Cardiovascular;  Laterality: Bilateral;   TONSILLECTOMY AND ADENOIDECTOMY     TOTAL KNEE ARTHROPLASTY Left 12/09/2021   Procedure: LEFT TOTAL KNEE ARTHROPLASTY;  Surgeon: Harden Jerona GAILS, MD;  Location: [facility] OR;  Service: Orthopedics;  Laterality: Left;   TOTAL KNEE ARTHROPLASTY Right 11/30/2023   Procedure: ARTHROPLASTY, KNEE, TOTAL;  Surgeon: Harden Jerona GAILS, MD;  Location: [facility] OR;  Service: Orthopedics;  Laterality: Right;   Patient Active Problem List   Diagnosis Date Noted   S/P total knee replacement, right 12/04/2023   Total knee replacement status, right 11/30/2023   Unilateral primary osteoarthritis, right knee 11/30/2023    Anticoagulated 11/23/2023   Burn 06/27/2023   Hemorrhoid 03/23/2023   RVF (right ventricular failure) (HCC) 01/12/2023   Atopic dermatitis 01/12/2023   Shortness of breath 08/29/2022   Annual physical exam 08/29/2022   Right groin pain 01/13/2022   Bilateral pulmonary embolism with right heart strain RV/LV ratio 1:5 (HCC) 12/31/2021   Acute respiratory failure with hypoxia (HCC) 12/31/2021   Total knee replacement status, left 12/09/21 12/09/2021   Unilateral primary osteoarthritis, left knee    Shoulder disorder 09/03/2021   Arthritis of knee 09/03/2021   Lumbar radiculopathy 09/03/2021   Abnormal MRI, knee 09/03/2021   Chronic pain of right knee 09/03/2021   Bronchitis 03/25/2021   Anemia 01/09/2020   HTN (hypertension) 12/31/2019   Chest pain 12/31/2019   DOE (dyspnea on exertion) 12/31/2019   Acute coronary syndrome (HCC) 12/31/2019   Unstable angina (HCC) 12/31/2019   Atherosclerosis 08/23/2018   Screening for tuberculosis 07/12/2018   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   Arthralgia 07/12/2018   Solitary pulmonary nodule 07/12/2018   Neck pain 02/15/2018   Dizziness 02/15/2018   Elevated TSH 02/15/2018   Screening for HIV (human immunodeficiency virus) 10/05/2017   Neuropathy 10/05/2017   Screening for colon cancer 10/05/2017   Hyperlipidemia 08/04/2016   Prediabetes 08/04/2016   Back pain 08/04/2016   Bilateral  knee pain 06/18/2016   History of DVT of lower extremity 04/01/2014   History of DVT and PE (deep vein thrombosis) 05/04/2011    PCP: Dineen Rollene MATSU, FNP   REFERRING PROVIDER: Harden Jerona GAILS, MD  REFERRING DIAG: Diagnosis [phone] (ICD-10-CM) - S/P total knee replacement, right  Rationale for Evaluation and Treatment: Rehabilitation  THERAPY DIAG:  Chronic pain of right knee  Difficulty in walking, not elsewhere classified  Stiffness of right knee, not elsewhere classified  ONSET DATE: S/P R TKA on 11/30/2023  SUBJECTIVE:                                                                                                                                                                                            SUBJECTIVE STATEMENT: Stiffness is the only problem right now.      Moving to [place]  this December 2025 for good to help her son with his 4 kids. The bedrooms are on the 2nd floor.     PERTINENT HISTORY:  S/P R TKA on 11/30/2023. Has only had to 2 PT home health follow up treatments. Sister brought her to PT today. Has been icing her knee and propping it up.  PAIN:  Are you having pain? 9/10 this morning, running late early AM and forgot her pain meds and is still stiff from overnight sleep.   PRECAUTIONS: Knee  RED FLAGS: Bowel or bladder incontinence: No and Cauda equina syndrome: No   WEIGHT BEARING RESTRICTIONS: WBAT  FALLS:  Has patient fallen in last 6 months? No  LIVING ENVIRONMENT: Lives with: sister Lives in: House/apartment Stairs: No Has following equipment at home: Environmental Consultant - 2 wheeled  OCCUPATION: Retired  PLOF: Mod I with gait using SPC  PATIENT GOALS: be able to stand up and walk.   NEXT MD VISIT: 01/09/2024  OBJECTIVE:  Note: Objective measures were completed at Evaluation unless otherwise noted.   PATIENT SURVEYS   Extreme difficulty/unable (0), Quite a bit of difficulty (1), Moderate difficulty (2), Little difficulty (3), No difficulty (4) Survey date:  02/08/2024  Any of your usual work, housework or school activities 2  2. Usual hobbies, recreational or sporting activities 0  3. Getting into/out of the bath 3  4. Walking between rooms 0  5. Putting on socks/shoes 2  6. Squatting  0  7. Lifting an object, like a bag of groceries from the floor 2  8. Performing light activities around your home 2  9. Performing heavy activities around your home 0  10. Getting into/out of a car 3  11. Walking 2 blocks 0  12. Walking 1 mile 0  13. Going up/down 10 stairs (1 flight) 0  14. Standing for 1 hour  2  15.  sitting for 1 hour 4  16. Running on even ground 0  17. Running on uneven ground 0  18. Making sharp turns while running fast 0  19. Hopping  0  20. Rolling over in bed 2  Score total:  22/80     TREATMENT 03/22/24            Finished 14 weeks post op on 03/07/2024  Therapeutic activity  Nustep seat 10, arms 10 level 1 for 5 minutes for joint nutrition and decreased R knee joint stiffness.    Forward step up onto first regular step with B UE assist  R 10x2  Standing R knee flexion stretch onto first stair step with B UE assist   10x5 seconds for 2 sets  Standing R gastroc stretch 30 seconds x 3 with B UE assist   Standing with B UE assist   Hip abduction    R 10x5 seconds, then 5x5 seconds  Standing static mini lunge with contralateral UE assist to promote quad and glute strength  R 10x2    Seated knee flexion red band   R 8x2    Improved technique, movement at target joints, use of target muscles after mod verbal, visual, tactile cues.      PATIENT EDUCATION:  Education details: there-ex, HEP, POC Person educated: Patient Education method: Explanation, Demonstration, Tactile cues, Verbal cues, and Handouts Education comprehension: verbalized understanding and returned demonstration  HOME EXERCISE PROGRAM: Access Code: [email or web address] URL: [email or web address] Date: 12/28/2023 Prepared by: Emil Glassman  Exercises - Supine Quad Set  - 3 x daily - 7 x weekly - 3 sets - 10 reps - 5 seconds hold - Seated Knee Flexion AAROM  - 3 x daily - 7 x weekly - 3 sets - 10 reps - 5 seconds hold - Seated Heel Toe Raises  - 3 x daily - 7 x weekly - 3 sets - 10 reps - Seated Long Arc Quad  - 2 x daily - 7 x weekly - 2 sets - 10 reps - 5 seconds hold  - LE neural flossing  - 1 x daily - 7 x weekly - 3 sets - 10 reps  - Standing Quad Set  - 3 x daily - 7 x weekly - 3 sets - 10 reps - 5 seconds hold - Standing Hip Abduction with Counter Support  - 2 x  daily - 7 x weekly - 1 sets - 8 reps - 5 seconds hold  - Seated Flexion Stretch  - 3 x daily - 7 x weekly - 3 sets - 8 reps - 10 seconds hold  - Mini Squat with Counter Support  - 1 x daily - 7 x weekly - 2 sets - 7 reps   Sitting with lumbar towel roll with trunk extension   Decreased R L5 dermatome symptoms.    ASSESSMENT:  CLINICAL IMPRESSION:   Continued working on improving R knee flexion secondary to stiffness as well as R LE quadriceps, hamstrings and glute med and max strengthening to help decrease difficulty with gait and stair negotiation. Pt will benefit from continued skilled physical therapy services to improve R knee ROM, R LE strength, function, and ability to ambulate with less difficulty.     OBJECTIVE IMPAIRMENTS: Abnormal gait, decreased balance, difficulty walking, decreased ROM, decreased strength, improper body mechanics, postural dysfunction, and pain.   ACTIVITY LIMITATIONS: carrying,  lifting, bending, standing, squatting, stairs, transfers, bed mobility, dressing, and locomotion level  PARTICIPATION LIMITATIONS:   PERSONAL FACTORS: Age, Fitness, Past/current experiences, Time since onset of injury/illness/exacerbation, and 3+ comorbidities: arthritis, hx of DVT 2013, dyspnea, HTN, PVD, PE 2013 are also affecting patient's functional outcome.   REHAB POTENTIAL: Fair    CLINICAL DECISION MAKING: Stable/uncomplicated  EVALUATION COMPLEXITY: Low   GOALS: Goals reviewed with patient? Yes  SHORT TERM GOALS: Target date: 01/06/2024  Pt will be independent with her initial HEP to improve R knee ROM, strength, and ability to ambulate and perform transfers with less difficulty.  Baseline: Pt has started her initial HEP (12/28/2023); No questions (02/08/2024) Goal status: MET  LONG TERM GOALS: Target date: 03/30/2024  Pt will be able to ambulate at least 500 ft without AD and no LOB to promote mobility.  Baseline: Pt currently ambulating with rw. (12/28/2023);   Gait with SPC on L 20 ft, mod A. unsteady secondary to weakness; currently ambulates with rw (02/03/2024); at least 20 ft with [facility] on L side, Min A to CGA, improved steadiness (02/22/2024); 500 ft with NBQC with 1 sitting rest break (03/20/2024) Goal status: PROGRESSING  2.  Pt will improve R knee flexion AROM to 115 degrees or more and R knee extension AROM to -5 degrees or more to promote ability to ambulate, perform standing tasks, perform transfers and negotiate stairs with less difficulty.  Baseline:  Active  Right eval R 02/03/2024 R 02/22/2024 R 03/20/2024  Knee flexion 56 (60 AAROM, stiff end feel) 75 degrees 71 74 degrees  Knee extension -24 (-15 AAROM), stiff end feel -15 degrees -13 -15 degrees   Goal status: PROGRESSING  3.  Pt will improve her R knee flexion and extension strength by at least 1/2 MMT grade to promote ability to ambulate, perform standing tasks with less difficulty.  Baseline:  MMT Right eval R 02/03/2024 R 02/22/2024  Knee flexion 3+ 4- 4  Knee extension 4 5 5    Goal status: MET  4.  Pt will improve her LEFS score by at least 20 points as a demonstration of improved function.  Baseline: 7/80 (12/28/2023); 22/80 (02/08/2024) Goal status: PROGRESSING  PLAN:  PT FREQUENCY: 1-2x/week  PT DURATION: other: 5 weeks  PLANNED INTERVENTIONS: 97110-Therapeutic exercises, 97530- Therapeutic activity, 97112- Neuromuscular re-education, 97535- Self Care, 02859- Manual therapy, [phone]- Gait training, [phone]- Electrical stimulation (unattended), [phone]- Ionotophoresis 4mg /ml Dexamethasone , Patient/Family education, Balance training, Joint mobilization, and Scar mobilization.  PLAN FOR NEXT SESSION: knee ROM, strengthening, gait, function, manual techniques, modalities PRN   Thank you for your referral.  Nadene Witherspoon, PT, DPT 03/22/2024, 2:27 PM  2:27 PM, 03/22/24  Physical Therapist - [place] [phone] (Office)

## 2024-03-23 ENCOUNTER — Ambulatory Visit: Admitting: Physician Assistant

## 2024-03-26 ENCOUNTER — Encounter: Payer: Self-pay | Admitting: Physician Assistant

## 2024-03-26 ENCOUNTER — Other Ambulatory Visit: Payer: Self-pay

## 2024-03-26 ENCOUNTER — Ambulatory Visit: Admitting: Physician Assistant

## 2024-03-26 DIAGNOSIS — M79671 Pain in right foot: Secondary | ICD-10-CM

## 2024-03-26 DIAGNOSIS — Z96651 Presence of right artificial knee joint: Secondary | ICD-10-CM

## 2024-03-26 MED ORDER — OXYCODONE-ACETAMINOPHEN 5-325 MG PO TABS
1.0000 | ORAL_TABLET | Freq: Four times a day (QID) | ORAL | 0 refills | Status: DC | PRN
  Filled 2024-03-26: qty 30, 8d supply, fill #0

## 2024-03-26 MED ORDER — PREDNISONE 10 MG PO TABS
10.0000 mg | ORAL_TABLET | Freq: Every day | ORAL | 0 refills | Status: DC
  Filled 2024-03-26: qty 30, 30d supply, fill #0

## 2024-03-26 NOTE — Progress Notes (Signed)
 Office Visit Note   Patient: Leah Roberts           Date of Birth: 07-01-1950           MRN: [id number] Visit Date: 03/26/2024              Requested by: Dineen Rollene MATSU, FNP [address] [place],  [place] [address] PCP: Dineen Rollene MATSU, FNP  Chief Complaint  Patient presents with   Right Knee - Follow-up    11/30/2023 right total knee replacemen      HPI: 73 y/o female s/p Visit Diagnoses: right total knee arthroplasty 11/30/23. She is in PT working on ROM.  She over id her activity prior to her last visit so we decided to  Increase her Ice machine daily, elevation and continue with daily activities as tolerates.  I feel like she over used her knee with increased activity.  RICE.  Continue PT . She states her left knee is improving.  She has a new complaints of right GT MTP to motion with edema.  The pain started 2 weeks ago.  She denies injury.  Ice and heat seem to help some with elevation.  Of note she is moving to [place]  soon with her son.    Assessment & Plan:  Plan: I refilled her pain medication and sent in prescription for Prednisone .  I asked her to let her new Physician in [place]  know that if her pain does not improve she will need further work up.  She may need to be worked up for possible gout.  I did not want to start this due to follow up needs since she is moving very soon.    Follow-Up Instructions: Return if symptoms worsen or fail to improve.   Ortho Exam  Patient is alert, oriented, no adenopathy, well-dressed, normal affect, normal respiratory effort. Right knee non tender to palpation.  No effusion no cellulitis.  Minimal keloid to right knee scar.  Palpable DP pulse.  Decreased passive and active ROM of the right GT with pain.  Mild edema without cellulitis or open wounds.      Imaging: No fracture or cystic bone changes GT right foot.  No indication of arthritis.    Labs: Lab Results  Component Value Date   HGBA1C 5.8  02/28/2024   HGBA1C 6.5 09/22/2023   HGBA1C 6.1 (A) 01/12/2023   ESRSEDRATE 10 05/29/2020   CRP <1.0 05/29/2020     Lab Results  Component Value Date   ALBUMIN  4.2 02/28/2024   ALBUMIN  4.0 03/08/2022   ALBUMIN  3.7 01/13/2022    Lab Results  Component Value Date   MG 2.0 01/04/2022   MG 2.0 01/03/2022   MG 1.8 01/02/2022   Lab Results  Component Value Date   VD25OH 17.66 (L) 02/28/2024   VD25OH 21.43 (L) 10/05/2017    No results found for: PREALBUMIN    Latest Ref Rng & Units 11/24/2023    2:07 PM 06/17/2023    2:02 PM 09/22/2022    2:52 PM  CBC EXTENDED  WBC 4.0 - 10.5 K/uL 6.7  6.5  6.3   RBC 3.87 - 5.11 MIL/uL 4.59  4.53  4.52   Hemoglobin 12.0 - 15.0 g/dL 87.4  87.5  87.5   HCT 36.0 - 46.0 % 39.8  38.8  38.3   Platelets 150 - 400 K/uL 236  239  243      There is no height or weight on file to calculate  BMI.  Orders:  Orders Placed This Encounter  Procedures   XR Foot Complete Right   Meds ordered this encounter  Medications   oxyCODONE -acetaminophen  (PERCOCET/ROXICET) 5-325 MG tablet    Sig: Take 1 tablet by mouth every 6 (six) hours as needed.    Dispense:  30 tablet    Refill:  0    Supervising Provider:   DUDA, MARCUS V [[id number]]   predniSONE  (DELTASONE ) 10 MG tablet    Sig: Take 1 tablet (10 mg total) by mouth daily with breakfast.    Dispense:  30 tablet    Refill:  0    Supervising Provider:   DUDA, MARCUS V [[id number]]     Procedures: No procedures performed  Clinical Data: No additional findings.  ROS:  All other systems negative, except as noted in the HPI. Review of Systems  Objective: Vital Signs: There were no vitals taken for this visit.  Specialty Comments:  MRI LUMBAR SPINE WITHOUT CONTRAST   TECHNIQUE: Multiplanar, multisequence MR imaging of the lumbar spine was performed. No intravenous contrast was administered.   COMPARISON:  Lumbar spine radiographs 01/27/2023   FINDINGS: Segmentation:  Standard.   Alignment:  3  mm anterolisthesis of L4 on L5.   Vertebrae: No fracture or suspicious marrow lesion. Mild degenerative facet edema at L4-5.   Conus medullaris and cauda equina: Conus extends to the L1 level. Conus and cauda equina appear normal.   Paraspinal and other soft tissues: Unremarkable.   Disc levels:   T12-L1: Minimal disc bulging, small left central disc protrusion, and mild right and moderate left facet and ligamentum flavum hypertrophy without stenosis.   L1-2: Minimal disc bulging, a shallow left paracentral to left subarticular disc protrusion, and moderate facet and ligamentum flavum hypertrophy without stenosis.   L2-3: Mild disc bulging and severe facet and ligamentum flavum hypertrophy without stenosis.   L3-4: Minimal disc bulging and severe facet and ligamentum flavum hypertrophy without stenosis.   L4-5: Disc desiccation. Anterolisthesis with mild disc uncovering and severe facet and ligamentum flavum hypertrophy without stenosis.   L5-S1: Disc desiccation and moderate disc space narrowing. Disc bulging, endplate spurring, and severe facet and ligamentum flavum hypertrophy result in mild bilateral lateral recess stenosis and moderate to severe bilateral neural foraminal stenosis without significant spinal stenosis.   IMPRESSION: 1. Severe diffuse lumbar facet arthrosis. 2. Moderate to severe bilateral neural foraminal stenosis at L5-S1. 3. No spinal stenosis.     Electronically Signed   By: Dasie Hamburg M.D.   On: 03/07/2023 08:47  PMFS History: Patient Active Problem List   Diagnosis Date Noted   S/P total knee replacement, right 12/04/2023   Total knee replacement status, right 11/30/2023   Unilateral primary osteoarthritis, right knee 11/30/2023   Anticoagulated 11/23/2023   Burn 06/27/2023   Hemorrhoid 03/23/2023   RVF (right ventricular failure) (HCC) 01/12/2023   Atopic dermatitis 01/12/2023   Shortness of breath 08/29/2022   Annual physical exam  08/29/2022   Right groin pain 01/13/2022   Bilateral pulmonary embolism with right heart strain RV/LV ratio 1:5 (HCC) 12/31/2021   Acute respiratory failure with hypoxia (HCC) 12/31/2021   Total knee replacement status, left 12/09/21 12/09/2021   Unilateral primary osteoarthritis, left knee    Shoulder disorder 09/03/2021   Arthritis of knee 09/03/2021   Lumbar radiculopathy 09/03/2021   Abnormal MRI, knee 09/03/2021   Chronic pain of right knee 09/03/2021   Bronchitis 03/25/2021   Anemia 01/09/2020   HTN (hypertension) 12/31/2019  Chest pain 12/31/2019   DOE (dyspnea on exertion) 12/31/2019   Acute coronary syndrome (HCC) 12/31/2019   Unstable angina (HCC) 12/31/2019   Atherosclerosis 08/23/2018   Screening for tuberculosis 07/12/2018   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   Arthralgia 07/12/2018   Solitary pulmonary nodule 07/12/2018   Neck pain 02/15/2018   Dizziness 02/15/2018   Elevated TSH 02/15/2018   Screening for HIV (human immunodeficiency virus) 10/05/2017   Neuropathy 10/05/2017   Screening for colon cancer 10/05/2017   Hyperlipidemia 08/04/2016   Prediabetes 08/04/2016   Back pain 08/04/2016   Bilateral knee pain 06/18/2016   History of DVT of lower extremity 04/01/2014   History of DVT and PE (deep vein thrombosis) 05/04/2011   Past Medical History:  Diagnosis Date   Arthritis    Complication of anesthesia    Hard to wake up   DVT (deep venous thrombosis) (HCC) 2013   Dyspnea    due to knee pain   GERD (gastroesophageal reflux disease)    History of blood transfusion    History of hypertension    Hypertension    Peripheral vascular disease    DVT   Pre-diabetes    Pulmonary embolism (HCC) 2013    Family History  Problem Relation Age of Onset   Hypertension Father    Heart disease Father 72   Thyroid disease Sister    Allergies Sister    CVA Neg Hx    Heart attack Neg Hx    High Cholesterol Neg Hx    Colon cancer Neg Hx    AAA (abdominal aortic aneurysm)  Neg Hx     Past Surgical History:  Procedure Laterality Date   APPENDECTOMY     PULMONARY THROMBECTOMY Bilateral 01/01/2022   Procedure: PULMONARY THROMBECTOMY;  Surgeon: Jama Cordella MATSU, MD;  Location: [facility] IN[facility]IVE [facility];  Service: Cardiovascular;  Laterality: Bilateral;   TONSILLECTOMY AND ADENOIDECTOMY     TOTAL KNEE ARTHROPLASTY Left 12/09/2021   Procedure: LEFT TOTAL KNEE ARTHROPLASTY;  Surgeon: Harden Jerona GAILS, MD;  Location: [facility] OR;  Service: Orthopedics;  Laterality: Left;   TOTAL KNEE ARTHROPLASTY Right 11/30/2023   Procedure: ARTHROPLASTY, KNEE, TOTAL;  Surgeon: Harden Jerona GAILS, MD;  Location: [facility] OR;  Service: Orthopedics;  Laterality: Right;   Social History   Occupational History   Not on file  Tobacco Use   Smoking status: Never   Smokeless tobacco: Never  Vaping Use   Vaping status: Never Used  Substance and Sexual Activity   Alcohol use: Yes    Comment: Ocassional glass of wine   Drug use: No   Sexual activity: Not on file

## 2024-03-27 ENCOUNTER — Ambulatory Visit

## 2024-03-27 DIAGNOSIS — R262 Difficulty in walking, not elsewhere classified: Secondary | ICD-10-CM

## 2024-03-27 DIAGNOSIS — M25661 Stiffness of right knee, not elsewhere classified: Secondary | ICD-10-CM

## 2024-03-27 DIAGNOSIS — G8929 Other chronic pain: Secondary | ICD-10-CM

## 2024-03-27 NOTE — Therapy (Signed)
 OUTPATIENT PHYSICAL THERAPY  TREATMENT And Discharge Summary   Patient Name: Leah Roberts MRN: [id number] DOB:March 31, 1951, 73 y.o., female Today's Date: 03/27/2024  END OF SESSION:  PT End of Session - 03/27/24 1355     Visit Number 22    Number of Visits 27    Date for Recertification  03/30/24    Authorization Type Self pay    Progress Note Due on Visit 20    PT Start Time 1355   pt arrived late   PT Stop Time 1428    PT Time Calculation (min) 33 min    Activity Tolerance Patient tolerated treatment well;Patient limited by pain    Behavior During Therapy [facility] for tasks assessed/performed                           Past Medical History:  Diagnosis Date   Arthritis    Complication of anesthesia    Hard to wake up   DVT (deep venous thrombosis) (HCC) 2013   Dyspnea    due to knee pain   GERD (gastroesophageal reflux disease)    History of blood transfusion    History of hypertension    Hypertension    Peripheral vascular disease    DVT   Pre-diabetes    Pulmonary embolism (HCC) 2013   Past Surgical History:  Procedure Laterality Date   APPENDECTOMY     PULMONARY THROMBECTOMY Bilateral 01/01/2022   Procedure: PULMONARY THROMBECTOMY;  Surgeon: Jama Cordella MATSU, MD;  Location: [facility] INV[facility] LAB;  Service: Cardiovascular;  Laterality: Bilateral;   TONSILLECTOMY AND ADENOIDECTOMY     TOTAL KNEE ARTHROPLASTY Left 12/09/2021   Procedure: LEFT TOTAL KNEE ARTHROPLASTY;  Surgeon: Harden Jerona GAILS, MD;  Location: [facility] OR;  Service: Orthopedics;  Laterality: Left;   TOTAL KNEE ARTHROPLASTY Right 11/30/2023   Procedure: ARTHROPLASTY, KNEE, TOTAL;  Surgeon: Harden Jerona GAILS, MD;  Location: [facility] OR;  Service: Orthopedics;  Laterality: Right;   Patient Active Problem List   Diagnosis Date Noted   S/P total knee replacement, right 12/04/2023   Total knee replacement status, right 11/30/2023   Unilateral primary osteoarthritis, right knee 11/30/2023    Anticoagulated 11/23/2023   Burn 06/27/2023   Hemorrhoid 03/23/2023   RVF (right ventricular failure) (HCC) 01/12/2023   Atopic dermatitis 01/12/2023   Shortness of breath 08/29/2022   Annual physical exam 08/29/2022   Right groin pain 01/13/2022   Bilateral pulmonary embolism with right heart strain RV/LV ratio 1:5 (HCC) 12/31/2021   Acute respiratory failure with hypoxia (HCC) 12/31/2021   Total knee replacement status, left 12/09/21 12/09/2021   Unilateral primary osteoarthritis, left knee    Shoulder disorder 09/03/2021   Arthritis of knee 09/03/2021   Lumbar radiculopathy 09/03/2021   Abnormal MRI, knee 09/03/2021   Chronic pain of right knee 09/03/2021   Bronchitis 03/25/2021   Anemia 01/09/2020   HTN (hypertension) 12/31/2019   Chest pain 12/31/2019   DOE (dyspnea on exertion) 12/31/2019   Acute coronary syndrome (HCC) 12/31/2019   Unstable angina (HCC) 12/31/2019   Atherosclerosis 08/23/2018   Screening for tuberculosis 07/12/2018   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   Arthralgia 07/12/2018   Solitary pulmonary nodule 07/12/2018   Neck pain 02/15/2018   Dizziness 02/15/2018   Elevated TSH 02/15/2018   Screening for HIV (human immunodeficiency virus) 10/05/2017   Neuropathy 10/05/2017   Screening for colon cancer 10/05/2017   Hyperlipidemia 08/04/2016   Prediabetes 08/04/2016   Back pain 08/04/2016  Bilateral knee pain 06/18/2016   History of DVT of lower extremity 04/01/2014   History of DVT and PE (deep vein thrombosis) 05/04/2011    PCP: Dineen Rollene MATSU, FNP   REFERRING PROVIDER: Harden Jerona GAILS, MD  REFERRING DIAG: Diagnosis [phone] (ICD-10-CM) - S/P total knee replacement, right  Rationale for Evaluation and Treatment: Rehabilitation  THERAPY DIAG:  Chronic pain of right knee  Difficulty in walking, not elsewhere classified  Stiffness of right knee, not elsewhere classified  ONSET DATE: S/P R TKA on 11/30/2023  SUBJECTIVE:                                                                                                                                                                                            SUBJECTIVE STATEMENT: R knee was bad yesterday. Pt was stuck at [facility] for 3 hours because her sister did not pick her up and did a lot of standing. Pt was standing for 3 hours at [facility] yesterday.  Feels more confident overall standing with her R LE, more strength.     Moving to [place]  this December 2025 for good to help her son with his 4 kids. The bedrooms are on the 2nd floor.     PERTINENT HISTORY:  S/P R TKA on 11/30/2023. Has only had to 2 PT home health follow up treatments. Sister brought her to PT today. Has been icing her knee and propping it up.  PAIN:  Are you having pain? 9/10 this morning, running late early AM and forgot her pain meds and is still stiff from overnight sleep.   PRECAUTIONS: Knee  RED FLAGS: Bowel or bladder incontinence: No and Cauda equina syndrome: No   WEIGHT BEARING RESTRICTIONS: WBAT  FALLS:  Has patient fallen in last 6 months? No  LIVING ENVIRONMENT: Lives with: sister Lives in: House/apartment Stairs: No Has following equipment at home: Environmental Consultant - 2 wheeled  OCCUPATION: Retired  PLOF: Mod I with gait using SPC  PATIENT GOALS: be able to stand up and walk.   NEXT MD VISIT: 01/09/2024  OBJECTIVE:  Note: Objective measures were completed at Evaluation unless otherwise noted.   PATIENT SURVEYS   Extreme difficulty/unable (0), Quite a bit of difficulty (1), Moderate difficulty (2), Little difficulty (3), No difficulty (4) Survey date:  02/08/2024  Any of your usual work, housework or school activities 2  2. Usual hobbies, recreational or sporting activities 0  3. Getting into/out of the bath 3  4. Walking between rooms 0  5. Putting on socks/shoes 2  6. Squatting  0  7. Lifting an object, like a bag of groceries from the  floor 2  8. Performing light activities around your home  2  9. Performing heavy activities around your home 0  10. Getting into/out of a car 3  11. Walking 2 blocks 0  12. Walking 1 mile 0  13. Going up/down 10 stairs (1 flight) 0  14. Standing for 1 hour 2  15.  sitting for 1 hour 4  16. Running on even ground 0  17. Running on uneven ground 0  18. Making sharp turns while running fast 0  19. Hopping  0  20. Rolling over in bed 2  Score total:  22/80     TREATMENT 03/27/24            Finished 14 weeks post op on 03/07/2024  Therapeutic activity  Nustep seat 10, arms 10 level 1 for 5 minutes for joint nutrition and decreased R knee joint stiffness.   Standing R knee flexion stretch onto first stair step with B UE assist   10x5 seconds for 2 sets, then 7x5 seconds  Forward step up onto first regular step with B UE assist  R 10x2  Standing R gastroc stretch 30 seconds x 3 with B UE assist   Standing with B UE assist   Hip abduction    R 10x5 seconds    Seated knee flexion red band   R 8x2    Improved technique, movement at target joints, use of target muscles after mod verbal, visual, tactile cues.      PATIENT EDUCATION:  Education details: there-ex, HEP, POC Person educated: Patient Education method: Explanation, Demonstration, Tactile cues, Verbal cues, and Handouts Education comprehension: verbalized understanding and returned demonstration  HOME EXERCISE PROGRAM: Access Code: [email or web address] URL: [email or web address] Date: 12/28/2023 Prepared by: Emil Glassman  Exercises - Supine Quad Set  - 3 x daily - 7 x weekly - 3 sets - 10 reps - 5 seconds hold - Seated Knee Flexion AAROM  - 3 x daily - 7 x weekly - 3 sets - 10 reps - 5 seconds hold - Seated Heel Toe Raises  - 3 x daily - 7 x weekly - 3 sets - 10 reps - Seated Long Arc Quad  - 2 x daily - 7 x weekly - 2 sets - 10 reps - 5 seconds hold  - LE neural flossing  - 1 x daily - 7 x weekly - 3 sets - 10 reps  - Standing Quad Set  - 3 x daily - 7 x  weekly - 3 sets - 10 reps - 5 seconds hold - Standing Hip Abduction with Counter Support  - 2 x daily - 7 x weekly - 1 sets - 8 reps - 5 seconds hold  - Seated Flexion Stretch  - 3 x daily - 7 x weekly - 3 sets - 8 reps - 10 seconds hold  - Mini Squat with Counter Support  - 1 x daily - 7 x weekly - 2 sets - 7 reps   Sitting with lumbar towel roll with trunk extension   Decreased R L5 dermatome symptoms.    - Seated Hamstring Curl with Anchored Resistance  - 1 x daily - 7 x weekly - 2 sets - 8 reps  Red band   ASSESSMENT:  CLINICAL IMPRESSION:  Pt arrived late so session was adjusted accordingly. Continued working on improving R knee ROM to decrease stiffness, and improving R LE strength to improve ability to ambulate, negotiate stairs, perform standing tasks with less difficulty. Pt  demonstrates overall improved knee ROM, strength, function, ability to ambulate since initial evaluation. Pt still demonstrates limited R knee flexion and extension AROM secondary to stiffness however. Pt to continue progress with her home exercise program. Skilled physical therapy services discharged secondary to pt moving to [place]  to live with family.      OBJECTIVE IMPAIRMENTS: Abnormal gait, decreased balance, difficulty walking, decreased ROM, decreased strength, improper body mechanics, postural dysfunction, and pain.   ACTIVITY LIMITATIONS: carrying, lifting, bending, standing, squatting, stairs, transfers, bed mobility, dressing, and locomotion level  PARTICIPATION LIMITATIONS:   PERSONAL FACTORS: Age, Fitness, Past/current experiences, Time since onset of injury/illness/exacerbation, and 3+ comorbidities: arthritis, hx of DVT 2013, dyspnea, HTN, PVD, PE 2013 are also affecting patient's functional outcome.   REHAB POTENTIAL: Fair    CLINICAL DECISION MAKING: Stable/uncomplicated  EVALUATION COMPLEXITY: Low   GOALS: Goals reviewed with patient? Yes  SHORT TERM GOALS: Target date:  01/06/2024  Pt will be independent with her initial HEP to improve R knee ROM, strength, and ability to ambulate and perform transfers with less difficulty.  Baseline: Pt has started her initial HEP (12/28/2023); No questions (02/08/2024) Goal status: MET  LONG TERM GOALS: Target date: 03/30/2024  Pt will be able to ambulate at least 500 ft without AD and no LOB to promote mobility.  Baseline: Pt currently ambulating with rw. (12/28/2023);  Gait with SPC on L 20 ft, mod A. unsteady secondary to weakness; currently ambulates with rw (02/03/2024); at least 20 ft with [facility] on L side, Min A to CGA, improved steadiness (02/22/2024); 500 ft with NBQC with 1 sitting rest break (03/20/2024) Goal status: PROGRESSING  2.  Pt will improve R knee flexion AROM to 115 degrees or more and R knee extension AROM to -5 degrees or more to promote ability to ambulate, perform standing tasks, perform transfers and negotiate stairs with less difficulty.  Baseline:  Active  Right eval R 02/03/2024 R 02/22/2024 R 03/20/2024 R  03/27/2024  Knee flexion 56 (60 AAROM, stiff end feel) 75 degrees 71 74 degrees 73 degrees  Knee extension -24 (-15 AAROM), stiff end feel -15 degrees -13 -15 degrees -14 degrees.    Goal status: PROGRESSING  3.  Pt will improve her R knee flexion and extension strength by at least 1/2 MMT grade to promote ability to ambulate, perform standing tasks with less difficulty.  Baseline:  MMT Right eval R 02/03/2024 R 02/22/2024  Knee flexion 3+ 4- 4  Knee extension 4 5 5    Goal status: MET  4.  Pt will improve her LEFS score by at least 20 points as a demonstration of improved function.  Baseline: 7/80 (12/28/2023); 22/80 (02/08/2024); 14 (pt knee however bothered her from standing for 3 hours being stranded at [facility] yesterday) (03/27/2024) Goal status: ONGOING  PLAN:  PT FREQUENCY: 1-2x/week  PT DURATION: other: 5 weeks  PLANNED INTERVENTIONS: 97110-Therapeutic exercises, 97530-  Therapeutic activity, [id number]- Neuromuscular re-education, 97535- Self Care, 02859- Manual therapy, [id number]- Gait training, [phone]- Electrical stimulation (unattended), [phone]- Ionotophoresis 4mg /ml Dexamethasone , Patient/Family education, Balance training, Joint mobilization, and Scar mobilization.  PLAN FOR NEXT SESSION: knee ROM, strengthening, gait, function, manual techniques, modalities PRN   Thank you for your referral.  Ritamarie Arkin, PT, DPT 03/27/2024, 3:54 PM  3:54 PM, 03/27/24  Physical Therapist - [place] [phone] (Office)

## 2024-03-28 ENCOUNTER — Other Ambulatory Visit: Payer: Self-pay

## 2024-04-03 ENCOUNTER — Telehealth: Payer: Self-pay

## 2024-04-03 ENCOUNTER — Telehealth: Payer: Self-pay | Admitting: Orthopedic Surgery

## 2024-04-03 DIAGNOSIS — Z96651 Presence of right artificial knee joint: Secondary | ICD-10-CM

## 2024-04-03 NOTE — Telephone Encounter (Signed)
 Pt is s/p a right TKR 11/30/2023. She is asking for a refill on Oxycodone  5/325 last filled on 03/26/2024 #30 and Robaxin  last filled on 02/28/2024 #40 please advise.

## 2024-04-03 NOTE — Telephone Encounter (Signed)
 Copied from CRM [id number]. Topic: Clinical - Medication Question >> Apr 03, 2024  4:02 PM Sophia H wrote: Reason for CRM: Patient states her apixaban  (ELIQUIS ) 5 MG TABS tablet is normally sent to the clinic and she picks it up there, wanting to know if it has arrived since it is time for a fill, patient will be going to [place]  on Friday and not returning till January. Please advise # [phone]

## 2024-04-03 NOTE — Telephone Encounter (Signed)
 Pt called wanting a refill of Methocarbamol  and Oxycodone . Pharmacy is Emergency Planning/management Officer. Call back number is [phone]

## 2024-04-04 ENCOUNTER — Other Ambulatory Visit: Payer: Self-pay | Admitting: Physician Assistant

## 2024-04-04 ENCOUNTER — Encounter: Payer: Self-pay | Admitting: Pharmacist

## 2024-04-04 ENCOUNTER — Other Ambulatory Visit: Payer: Self-pay

## 2024-04-04 DIAGNOSIS — M79671 Pain in right foot: Secondary | ICD-10-CM

## 2024-04-04 MED ORDER — OXYCODONE-ACETAMINOPHEN 5-325 MG PO TABS
1.0000 | ORAL_TABLET | Freq: Three times a day (TID) | ORAL | 0 refills | Status: AC | PRN
  Filled 2024-04-04: qty 21, 7d supply, fill #0

## 2024-04-04 MED ORDER — METHOCARBAMOL 500 MG PO TABS
500.0000 mg | ORAL_TABLET | Freq: Three times a day (TID) | ORAL | 1 refills | Status: AC | PRN
  Filled 2024-04-04: qty 30, 10d supply, fill #0
  Filled 2024-05-10: qty 30, 10d supply, fill #1

## 2024-04-04 NOTE — Telephone Encounter (Signed)
 Done already placed upfront for pt pickup

## 2024-04-04 NOTE — Telephone Encounter (Signed)
 Spoke to pt she is aware that we do have samples I placed 3 boxes 5 mg enough for at least a mnth upfront for pt pickup

## 2024-04-04 NOTE — Telephone Encounter (Signed)
Pt picked up today.

## 2024-04-04 NOTE — Telephone Encounter (Signed)
 Thanks Manuelita, Noted!

## 2024-04-04 NOTE — Progress Notes (Signed)
 Patient Assistance Program (PAP) Application   Left voicemail for patient to return call to discuss Eliquis  PAP 2026 enrollment. Left PharmD direct callback number on voicemail, [phone]   Manufacturer: [employer] ([employer]    (Re-enrollment) Medication(s): Eliquis   Patient Portion of Application:  04/04/24: Filled out and uploaded to clinic eFax folder for patient signature in front office.    Provider Portion of Application:  12/3: Provider portion completed by PharmD and uploaded PCP eFax folder for signature.  Prescription(s): Included in MAP application. Eliquis  5 mg Q12h (still appropriate based on age, weight, Scr)   Next Steps: [x]    Application filled out and uploaded to front office eFax folder for patient signature []    Once patient signs, front office to fax to [facility] PAP team [phone]  []    PCP form uploaded to [facility] eFax folder []    Upon PCP signature Application to be faxed to [facility] PAP team [phone]

## 2024-04-05 ENCOUNTER — Telehealth: Payer: Self-pay | Admitting: Pharmacist

## 2024-04-05 ENCOUNTER — Other Ambulatory Visit: Payer: Self-pay

## 2024-04-05 MED ORDER — PREDNISONE 10 MG PO TABS
10.0000 mg | ORAL_TABLET | Freq: Every day | ORAL | 0 refills | Status: AC
  Filled 2024-04-05 – 2024-05-10 (×2): qty 30, 30d supply, fill #0

## 2024-04-05 NOTE — Progress Notes (Signed)
 Faxed on 04/05/24

## 2024-04-05 NOTE — Telephone Encounter (Signed)
 Faxed on 04/05/24

## 2024-04-05 NOTE — Telephone Encounter (Signed)
 Pt came in and signed the paperwork today, they're in the pharmacist mailbox up front

## 2024-04-05 NOTE — Progress Notes (Signed)
 Brief Telephone Documentation Reason for Call: Patient assistance form   Summary of Call: Patient returning pharmacist's call regarding re-enrollment in the Eliquis  PAP program for 2026.   Patient preference to sign application in person at front office today or tomorrow.   Application currently filled out, pending patient income estimate and signature.  Once signed, front office to fax to [facility] PAP team [phone]

## 2024-04-14 ENCOUNTER — Other Ambulatory Visit: Payer: Self-pay

## 2024-04-23 ENCOUNTER — Telehealth: Payer: Self-pay | Admitting: Orthopedic Surgery

## 2024-04-23 NOTE — Telephone Encounter (Signed)
 Error wrong Autumn. Sent to Autumn H and need to be Autumn F

## 2024-04-23 NOTE — Telephone Encounter (Signed)
 This pt is s/p a left TKR 11/30/2023. Asking for refill of Methocarbamol  and Oxycodone  5/325 both filled on 04/04/2024. Please advise.

## 2024-04-23 NOTE — Telephone Encounter (Signed)
 Pt called requesting refill oxycodone  and methocarbomal. Pharmacy is [facility]. Pt number is [phone].

## 2024-04-24 NOTE — Telephone Encounter (Signed)
 I called the pt and she said that the pain is not in her knee it is her foot and that she had been seen in the office by the PA who told her it could be gout. There were no labs to confirm this but she did have xrays that were WNL. The pt states that she has been taking a prednisone  rx that was given to her that day and that this is not helping. I advised the pt that a refill on medication right now has been denied but that we would be happy to see her in the office to evaluate the source of her pain. The pt is in [place]  and she said that she will call the office for an appt once she returns back to the state.

## 2024-05-10 MED FILL — Apixaban Tab 5 MG: ORAL | 30 days supply | Qty: 60 | Fill #2 | Status: CN

## 2024-05-11 ENCOUNTER — Other Ambulatory Visit: Payer: Self-pay

## 2024-06-08 ENCOUNTER — Other Ambulatory Visit: Payer: Self-pay

## 2024-06-08 ENCOUNTER — Telehealth: Payer: Self-pay | Admitting: Orthopedic Surgery

## 2024-06-08 DIAGNOSIS — Z96651 Presence of right artificial knee joint: Secondary | ICD-10-CM

## 2024-06-08 DIAGNOSIS — Z96652 Presence of left artificial knee joint: Secondary | ICD-10-CM

## 2024-06-08 NOTE — Telephone Encounter (Signed)
 Pt wants to know if Dr. Harden can refer her to someone in [place]  for therapy for her legs because she is moving there to live with her son. Call back number is [phone]. Dr they want her referred to is [facility] [phone].

## 2024-06-08 NOTE — Telephone Encounter (Signed)
 I sent a referral for this pt to the pool but here is the information on where she is wanting to go.

## 2024-06-08 NOTE — Telephone Encounter (Signed)
 Autumn,  Patient is not a patient here at [facility]. I think you sent this by mistake to me.
